# Patient Record
Sex: Female | Born: 1955 | Race: Black or African American | Hispanic: No | Marital: Single | Smoking: Current every day smoker
Health system: Southern US, Community
[De-identification: ages and names within clinical notes are randomized; demographics above are authoritative.]

## PROBLEM LIST (undated history)

## (undated) DIAGNOSIS — F419 Anxiety disorder, unspecified: Secondary | ICD-10-CM

## (undated) DIAGNOSIS — J9610 Chronic respiratory failure, unspecified whether with hypoxia or hypercapnia: Secondary | ICD-10-CM

## (undated) DIAGNOSIS — I509 Heart failure, unspecified: Secondary | ICD-10-CM

## (undated) DIAGNOSIS — I10 Essential (primary) hypertension: Secondary | ICD-10-CM

## (undated) DIAGNOSIS — K219 Gastro-esophageal reflux disease without esophagitis: Secondary | ICD-10-CM

## (undated) DIAGNOSIS — J45909 Unspecified asthma, uncomplicated: Secondary | ICD-10-CM

## (undated) DIAGNOSIS — F32A Depression, unspecified: Secondary | ICD-10-CM

## (undated) DIAGNOSIS — F329 Major depressive disorder, single episode, unspecified: Secondary | ICD-10-CM

## (undated) DIAGNOSIS — J449 Chronic obstructive pulmonary disease, unspecified: Secondary | ICD-10-CM

## (undated) HISTORY — PX: TUBAL LIGATION: SHX77

---

## 2013-01-25 ENCOUNTER — Emergency Department: Payer: Self-pay | Admitting: Emergency Medicine

## 2013-01-25 LAB — CK TOTAL AND CKMB (NOT AT ARMC): CK-MB: 6.8 ng/mL — ABNORMAL HIGH (ref 0.5–3.6)

## 2013-01-25 LAB — COMPREHENSIVE METABOLIC PANEL
Albumin: 4.3 g/dL (ref 3.4–5.0)
Alkaline Phosphatase: 72 U/L (ref 50–136)
Anion Gap: 2 — ABNORMAL LOW (ref 7–16)
Co2: 32 mmol/L (ref 21–32)
Creatinine: 0.92 mg/dL (ref 0.60–1.30)
EGFR (African American): >60
EGFR (Non-African Amer.): >60
SGPT (ALT): 44 U/L (ref 12–78)
Sodium: 141 mmol/L (ref 136–145)
Total Protein: 7.3 g/dL (ref 6.4–8.2)

## 2013-01-25 LAB — CBC
HCT: 36.9 % (ref 35.0–47.0)
HGB: 12.7 g/dL (ref 12.0–16.0)
MCH: 33.9 pg (ref 26.0–34.0)
MCHC: 34.5 g/dL (ref 32.0–36.0)
RBC: 3.76 10*6/uL — ABNORMAL LOW (ref 3.80–5.20)

## 2013-02-18 ENCOUNTER — Inpatient Hospital Stay: Payer: Self-pay | Admitting: Student

## 2013-02-18 LAB — CBC
HCT: 38.5 % (ref 35.0–47.0)
HGB: 12.8 g/dL (ref 12.0–16.0)
MCH: 32.9 pg (ref 26.0–34.0)
MCHC: 33.4 g/dL (ref 32.0–36.0)
MCV: 99 fL (ref 80–100)
Platelet: 311 10*3/uL (ref 150–440)
RBC: 3.9 10*6/uL (ref 3.80–5.20)
RDW: 12.6 % (ref 11.5–14.5)
WBC: 6.8 10*3/uL (ref 3.6–11.0)

## 2013-02-18 LAB — BASIC METABOLIC PANEL
Anion Gap: 2 — ABNORMAL LOW (ref 7–16)
Calcium, Total: 9.4 mg/dL (ref 8.5–10.1)
Chloride: 105 mmol/L (ref 98–107)
Co2: 33 mmol/L — ABNORMAL HIGH (ref 21–32)
Creatinine: 0.71 mg/dL (ref 0.60–1.30)
EGFR (African American): >60
Glucose: 105 mg/dL — ABNORMAL HIGH (ref 65–99)
Potassium: 4.3 mmol/L (ref 3.5–5.1)
Sodium: 140 mmol/L (ref 136–145)

## 2013-02-18 LAB — TROPONIN I: Troponin-I: <0.02 ng/mL

## 2013-02-19 LAB — CBC WITH DIFFERENTIAL/PLATELET
Basophil #: 0 10*3/uL (ref 0.0–0.1)
Basophil %: 0.2 %
Eosinophil #: 0 10*3/uL (ref 0.0–0.7)
Eosinophil %: 0 %
HCT: 37.8 % (ref 35.0–47.0)
HGB: 12.8 g/dL (ref 12.0–16.0)
Lymphocyte #: 0.7 10*3/uL — ABNORMAL LOW (ref 1.0–3.6)
Lymphocyte %: 11.8 %
MCH: 33.4 pg (ref 26.0–34.0)
MCHC: 33.9 g/dL (ref 32.0–36.0)
MCV: 99 fL (ref 80–100)
Monocyte #: 0.1 x10 3/mm — ABNORMAL LOW (ref 0.2–0.9)
Monocyte %: 2.5 %
Neutrophil #: 4.9 10*3/uL (ref 1.4–6.5)
Neutrophil %: 85.5 %
Platelet: 321 10*3/uL (ref 150–440)
RBC: 3.83 10*6/uL (ref 3.80–5.20)
RDW: 12.5 % (ref 11.5–14.5)
WBC: 5.8 10*3/uL (ref 3.6–11.0)

## 2013-02-19 LAB — BASIC METABOLIC PANEL
Anion Gap: 3 — ABNORMAL LOW (ref 7–16)
BUN: 15 mg/dL (ref 7–18)
Calcium, Total: 9.4 mg/dL (ref 8.5–10.1)
Chloride: 102 mmol/L (ref 98–107)
EGFR (African American): >60
EGFR (Non-African Amer.): >60
Glucose: 127 mg/dL — ABNORMAL HIGH (ref 65–99)
Potassium: 3.5 mmol/L (ref 3.5–5.1)
Sodium: 138 mmol/L (ref 136–145)

## 2013-02-19 LAB — LIPID PANEL: VLDL Cholesterol, Calc: 7 mg/dL (ref 5–40)

## 2013-02-19 LAB — HEMOGLOBIN A1C: Hemoglobin A1C: 5.7 % (ref 4.2–6.3)

## 2013-02-23 LAB — CULTURE, BLOOD (SINGLE)

## 2013-03-19 ENCOUNTER — Inpatient Hospital Stay: Payer: Self-pay | Admitting: Internal Medicine

## 2013-03-19 LAB — CBC
HGB: 13.2 g/dL (ref 12.0–16.0)
MCH: 32.7 pg (ref 26.0–34.0)
Platelet: 307 10*3/uL (ref 150–440)
RBC: 4.02 10*6/uL (ref 3.80–5.20)
RDW: 13.4 % (ref 11.5–14.5)

## 2013-03-19 LAB — BASIC METABOLIC PANEL
Anion Gap: 5 — ABNORMAL LOW (ref 7–16)
BUN: 9 mg/dL (ref 7–18)
Co2: 30 mmol/L (ref 21–32)
Creatinine: 0.68 mg/dL (ref 0.60–1.30)
EGFR (Non-African Amer.): >60
Sodium: 141 mmol/L (ref 136–145)

## 2013-03-20 LAB — CBC WITH DIFFERENTIAL/PLATELET
Basophil %: 0.3 %
Eosinophil %: 0 %
Lymphocyte #: 0.6 10*3/uL — ABNORMAL LOW (ref 1.0–3.6)
Lymphocyte %: 12.1 %
MCH: 33.2 pg (ref 26.0–34.0)
MCHC: 33.4 g/dL (ref 32.0–36.0)
MCV: 99 fL (ref 80–100)
Monocyte #: 0.1 x10 3/mm — ABNORMAL LOW (ref 0.2–0.9)
Monocyte %: 1.4 %
Neutrophil #: 4.4 10*3/uL (ref 1.4–6.5)
Neutrophil %: 86.2 %
Platelet: 294 10*3/uL (ref 150–440)
RBC: 3.73 10*6/uL — ABNORMAL LOW (ref 3.80–5.20)
RDW: 12.9 % (ref 11.5–14.5)

## 2013-03-20 LAB — COMPREHENSIVE METABOLIC PANEL
Albumin: 4.1 g/dL (ref 3.4–5.0)
Anion Gap: 4 — ABNORMAL LOW (ref 7–16)
BUN: 11 mg/dL (ref 7–18)
Bilirubin,Total: 0.3 mg/dL (ref 0.2–1.0)
Calcium, Total: 9.6 mg/dL (ref 8.5–10.1)
Chloride: 101 mmol/L (ref 98–107)
Co2: 32 mmol/L (ref 21–32)
Creatinine: 0.83 mg/dL (ref 0.60–1.30)
EGFR (African American): >60
Glucose: 188 mg/dL — ABNORMAL HIGH (ref 65–99)
Potassium: 3.9 mmol/L (ref 3.5–5.1)
SGOT(AST): 21 U/L (ref 15–37)
Sodium: 137 mmol/L (ref 136–145)
Total Protein: 7.3 g/dL (ref 6.4–8.2)

## 2013-03-20 LAB — MAGNESIUM: Magnesium: 2 mg/dL

## 2013-03-20 LAB — RAPID INFLUENZA A&B ANTIGENS

## 2013-03-24 LAB — CULTURE, BLOOD (SINGLE)

## 2013-05-11 ENCOUNTER — Inpatient Hospital Stay (HOSPITAL_COMMUNITY)
Admission: AD | Admit: 2013-05-11 | Payer: Self-pay | Source: Other Acute Inpatient Hospital | Admitting: Pulmonary Disease

## 2013-05-11 ENCOUNTER — Inpatient Hospital Stay
Admission: AD | Admit: 2013-05-11 | Payer: Self-pay | Source: Other Acute Inpatient Hospital | Admitting: Pulmonary Disease

## 2013-05-11 ENCOUNTER — Emergency Department: Payer: Self-pay | Admitting: Internal Medicine

## 2013-05-11 LAB — CBC
HCT: 42.6 % (ref 35.0–47.0)
HGB: 14.2 g/dL (ref 12.0–16.0)
MCH: 33.5 pg (ref 26.0–34.0)
MCHC: 33.4 g/dL (ref 32.0–36.0)
MCV: 100 fL (ref 80–100)
PLATELETS: 296 10*3/uL (ref 150–440)
RBC: 4.24 10*6/uL (ref 3.80–5.20)
RDW: 13.6 % (ref 11.5–14.5)
WBC: 5.9 10*3/uL (ref 3.6–11.0)

## 2013-05-11 LAB — PRO B NATRIURETIC PEPTIDE: B-Type Natriuretic Peptide: 56 pg/mL (ref 0–125)

## 2013-05-11 LAB — COMPREHENSIVE METABOLIC PANEL
ALK PHOS: 68 U/L
ANION GAP: 2 — AB (ref 7–16)
Albumin: 4.4 g/dL (ref 3.4–5.0)
BUN: 10 mg/dL (ref 7–18)
Bilirubin,Total: 0.3 mg/dL (ref 0.2–1.0)
CALCIUM: 10.1 mg/dL (ref 8.5–10.1)
CHLORIDE: 103 mmol/L (ref 98–107)
CO2: 34 mmol/L — AB (ref 21–32)
Creatinine: 0.67 mg/dL (ref 0.60–1.30)
EGFR (Non-African Amer.): >60
Glucose: 121 mg/dL — ABNORMAL HIGH (ref 65–99)
Osmolality: 278 (ref 275–301)
Potassium: 4.4 mmol/L (ref 3.5–5.1)
SGOT(AST): 23 U/L (ref 15–37)
SGPT (ALT): 30 U/L (ref 12–78)
SODIUM: 139 mmol/L (ref 136–145)
Total Protein: 8.2 g/dL (ref 6.4–8.2)

## 2013-05-11 LAB — TROPONIN I: Troponin-I: <0.02 ng/mL

## 2013-05-12 ENCOUNTER — Inpatient Hospital Stay: Payer: Self-pay | Admitting: Internal Medicine

## 2013-05-12 LAB — COMPREHENSIVE METABOLIC PANEL
ALT: 37 U/L (ref 12–78)
ANION GAP: 5 — AB (ref 7–16)
Albumin: 4.5 g/dL (ref 3.4–5.0)
Alkaline Phosphatase: 65 U/L
BUN: 12 mg/dL (ref 7–18)
Bilirubin,Total: 0.4 mg/dL (ref 0.2–1.0)
CALCIUM: 10.1 mg/dL (ref 8.5–10.1)
CHLORIDE: 100 mmol/L (ref 98–107)
CO2: 33 mmol/L — AB (ref 21–32)
Creatinine: 0.8 mg/dL (ref 0.60–1.30)
EGFR (Non-African Amer.): >60
Glucose: 146 mg/dL — ABNORMAL HIGH (ref 65–99)
Osmolality: 278 (ref 275–301)
Potassium: 4.2 mmol/L (ref 3.5–5.1)
SGOT(AST): 23 U/L (ref 15–37)
SODIUM: 138 mmol/L (ref 136–145)
Total Protein: 8.2 g/dL (ref 6.4–8.2)

## 2013-05-12 LAB — CBC
HCT: 42.6 % (ref 35.0–47.0)
HGB: 14.2 g/dL (ref 12.0–16.0)
MCH: 33.3 pg (ref 26.0–34.0)
MCHC: 33.3 g/dL (ref 32.0–36.0)
MCV: 100 fL (ref 80–100)
Platelet: 281 10*3/uL (ref 150–440)
RBC: 4.26 10*6/uL (ref 3.80–5.20)
RDW: 13.2 % (ref 11.5–14.5)
WBC: 5.6 10*3/uL (ref 3.6–11.0)

## 2013-05-12 LAB — CK TOTAL AND CKMB (NOT AT ARMC)
CK, TOTAL: 322 U/L — AB
CK-MB: 11.3 ng/mL — AB (ref 0.5–3.6)

## 2013-05-12 LAB — PRO B NATRIURETIC PEPTIDE: B-Type Natriuretic Peptide: 90 pg/mL (ref 0–125)

## 2013-05-12 LAB — TROPONIN I

## 2013-05-13 LAB — BASIC METABOLIC PANEL
ANION GAP: 0 — AB (ref 7–16)
BUN: 14 mg/dL (ref 7–18)
Calcium, Total: 9.5 mg/dL (ref 8.5–10.1)
Chloride: 104 mmol/L (ref 98–107)
Co2: 33 mmol/L — ABNORMAL HIGH (ref 21–32)
Creatinine: 1.03 mg/dL (ref 0.60–1.30)
EGFR (African American): >60
EGFR (Non-African Amer.): >60
Glucose: 137 mg/dL — ABNORMAL HIGH (ref 65–99)
Osmolality: 276 (ref 275–301)
Potassium: 4.3 mmol/L (ref 3.5–5.1)
Sodium: 137 mmol/L (ref 136–145)

## 2013-05-13 LAB — CBC WITH DIFFERENTIAL/PLATELET
BASOS ABS: 0 10*3/uL (ref 0.0–0.1)
Basophil %: 0.2 %
EOS ABS: 0 10*3/uL (ref 0.0–0.7)
EOS PCT: 0 %
HCT: 38.8 % (ref 35.0–47.0)
HGB: 13 g/dL (ref 12.0–16.0)
LYMPHS ABS: 0.6 10*3/uL — AB (ref 1.0–3.6)
LYMPHS PCT: 8.4 %
MCH: 33.7 pg (ref 26.0–34.0)
MCHC: 33.6 g/dL (ref 32.0–36.0)
MCV: 100 fL (ref 80–100)
MONOS PCT: 2.5 %
Monocyte #: 0.2 x10 3/mm (ref 0.2–0.9)
NEUTROS PCT: 88.9 %
Neutrophil #: 5.8 10*3/uL (ref 1.4–6.5)
PLATELETS: 256 10*3/uL (ref 150–440)
RBC: 3.86 10*6/uL (ref 3.80–5.20)
RDW: 13.1 % (ref 11.5–14.5)
WBC: 6.5 10*3/uL (ref 3.6–11.0)

## 2013-06-03 ENCOUNTER — Inpatient Hospital Stay: Payer: Self-pay | Admitting: Internal Medicine

## 2013-06-03 LAB — COMPREHENSIVE METABOLIC PANEL
Albumin: 4.1 g/dL (ref 3.4–5.0)
Alkaline Phosphatase: 69 U/L
Anion Gap: 7 (ref 7–16)
BUN: 14 mg/dL (ref 7–18)
Bilirubin,Total: 0.3 mg/dL (ref 0.2–1.0)
CALCIUM: 9.2 mg/dL (ref 8.5–10.1)
CO2: 28 mmol/L (ref 21–32)
Chloride: 106 mmol/L (ref 98–107)
Creatinine: 0.91 mg/dL (ref 0.60–1.30)
EGFR (Non-African Amer.): >60
GLUCOSE: 141 mg/dL — AB (ref 65–99)
OSMOLALITY: 284 (ref 275–301)
Potassium: 3.4 mmol/L — ABNORMAL LOW (ref 3.5–5.1)
SGOT(AST): 11 U/L — ABNORMAL LOW (ref 15–37)
SGPT (ALT): 23 U/L (ref 12–78)
Sodium: 141 mmol/L (ref 136–145)
Total Protein: 7.8 g/dL (ref 6.4–8.2)

## 2013-06-03 LAB — CBC
HCT: 40.3 % (ref 35.0–47.0)
HGB: 13.7 g/dL (ref 12.0–16.0)
MCH: 33.8 pg (ref 26.0–34.0)
MCHC: 34 g/dL (ref 32.0–36.0)
MCV: 99 fL (ref 80–100)
Platelet: 324 10*3/uL (ref 150–440)
RBC: 4.07 10*6/uL (ref 3.80–5.20)
RDW: 13.4 % (ref 11.5–14.5)
WBC: 10.8 10*3/uL (ref 3.6–11.0)

## 2013-06-03 LAB — TROPONIN I

## 2013-06-03 LAB — CK TOTAL AND CKMB (NOT AT ARMC)
CK, TOTAL: 133 U/L
CK-MB: 4.7 ng/mL — AB (ref 0.5–3.6)

## 2013-06-03 LAB — MAGNESIUM: MAGNESIUM: 1.9 mg/dL

## 2013-06-04 LAB — BASIC METABOLIC PANEL
Anion Gap: 4 — ABNORMAL LOW (ref 7–16)
BUN: 14 mg/dL (ref 7–18)
CALCIUM: 8.8 mg/dL (ref 8.5–10.1)
Chloride: 104 mmol/L (ref 98–107)
Co2: 30 mmol/L (ref 21–32)
Creatinine: 0.86 mg/dL (ref 0.60–1.30)
Glucose: 155 mg/dL — ABNORMAL HIGH (ref 65–99)
Osmolality: 279 (ref 275–301)
Potassium: 3.9 mmol/L (ref 3.5–5.1)
Sodium: 138 mmol/L (ref 136–145)

## 2013-06-04 LAB — CBC WITH DIFFERENTIAL/PLATELET
BASOS PCT: 0.1 %
Basophil #: 0 10*3/uL (ref 0.0–0.1)
EOS ABS: 0 10*3/uL (ref 0.0–0.7)
EOS PCT: 0 %
HCT: 36.7 % (ref 35.0–47.0)
HGB: 11.8 g/dL — ABNORMAL LOW (ref 12.0–16.0)
Lymphocyte #: 0.5 10*3/uL — ABNORMAL LOW (ref 1.0–3.6)
Lymphocyte %: 6.5 %
MCH: 31.8 pg (ref 26.0–34.0)
MCHC: 32.1 g/dL (ref 32.0–36.0)
MCV: 99 fL (ref 80–100)
Monocyte #: 0.4 x10 3/mm (ref 0.2–0.9)
Monocyte %: 4.3 %
NEUTROS ABS: 7.3 10*3/uL — AB (ref 1.4–6.5)
Neutrophil %: 89.1 %
Platelet: 315 10*3/uL (ref 150–440)
RBC: 3.71 10*6/uL — AB (ref 3.80–5.20)
RDW: 13.3 % (ref 11.5–14.5)
WBC: 8.2 10*3/uL (ref 3.6–11.0)

## 2013-06-28 ENCOUNTER — Inpatient Hospital Stay: Payer: Self-pay | Admitting: Family Medicine

## 2013-06-28 LAB — CBC
HCT: 41.1 % (ref 35.0–47.0)
HGB: 13.4 g/dL (ref 12.0–16.0)
MCH: 32.4 pg (ref 26.0–34.0)
MCHC: 32.5 g/dL (ref 32.0–36.0)
MCV: 100 fL (ref 80–100)
Platelet: 317 10*3/uL (ref 150–440)
RBC: 4.12 10*6/uL (ref 3.80–5.20)
RDW: 13.2 % (ref 11.5–14.5)
WBC: 5.7 10*3/uL (ref 3.6–11.0)

## 2013-06-28 LAB — CK TOTAL AND CKMB (NOT AT ARMC)
CK, TOTAL: 224 U/L — AB
CK-MB: 7.7 ng/mL — ABNORMAL HIGH (ref 0.5–3.6)

## 2013-06-28 LAB — COMPREHENSIVE METABOLIC PANEL
Albumin: 4.3 g/dL (ref 3.4–5.0)
Alkaline Phosphatase: 69 U/L
Anion Gap: 5 — ABNORMAL LOW (ref 7–16)
BUN: 7 mg/dL (ref 7–18)
Bilirubin,Total: 0.4 mg/dL (ref 0.2–1.0)
CHLORIDE: 102 mmol/L (ref 98–107)
CREATININE: 0.8 mg/dL (ref 0.60–1.30)
Calcium, Total: 9.6 mg/dL (ref 8.5–10.1)
Co2: 32 mmol/L (ref 21–32)
EGFR (African American): >60
EGFR (Non-African Amer.): >60
Glucose: 149 mg/dL — ABNORMAL HIGH (ref 65–99)
Osmolality: 278 (ref 275–301)
Potassium: 4 mmol/L (ref 3.5–5.1)
SGOT(AST): 16 U/L (ref 15–37)
SGPT (ALT): 24 U/L (ref 12–78)
SODIUM: 139 mmol/L (ref 136–145)
TOTAL PROTEIN: 7.9 g/dL (ref 6.4–8.2)

## 2013-06-28 LAB — TROPONIN I

## 2013-08-31 ENCOUNTER — Inpatient Hospital Stay: Payer: Self-pay | Admitting: Specialist

## 2013-08-31 LAB — CBC
HCT: 41.9 % (ref 35.0–47.0)
HGB: 13.7 g/dL (ref 12.0–16.0)
MCH: 33.3 pg (ref 26.0–34.0)
MCHC: 32.8 g/dL (ref 32.0–36.0)
MCV: 102 fL — ABNORMAL HIGH (ref 80–100)
PLATELETS: 343 10*3/uL (ref 150–440)
RBC: 4.13 10*6/uL (ref 3.80–5.20)
RDW: 13.2 % (ref 11.5–14.5)
WBC: 9.1 10*3/uL (ref 3.6–11.0)

## 2013-08-31 LAB — COMPREHENSIVE METABOLIC PANEL
ALK PHOS: 66 U/L
ALT: 31 U/L (ref 12–78)
Albumin: 4.2 g/dL (ref 3.4–5.0)
Anion Gap: 4 — ABNORMAL LOW (ref 7–16)
BUN: 12 mg/dL (ref 7–18)
Bilirubin,Total: 0.2 mg/dL (ref 0.2–1.0)
CHLORIDE: 100 mmol/L (ref 98–107)
Calcium, Total: 9.2 mg/dL (ref 8.5–10.1)
Co2: 31 mmol/L (ref 21–32)
Creatinine: 0.51 mg/dL — ABNORMAL LOW (ref 0.60–1.30)
EGFR (Non-African Amer.): >60
Glucose: 120 mg/dL — ABNORMAL HIGH (ref 65–99)
Osmolality: 271 (ref 275–301)
Potassium: 4.9 mmol/L (ref 3.5–5.1)
SGOT(AST): 28 U/L (ref 15–37)
Sodium: 135 mmol/L — ABNORMAL LOW (ref 136–145)
Total Protein: 8.1 g/dL (ref 6.4–8.2)

## 2013-08-31 LAB — PRO B NATRIURETIC PEPTIDE: B-Type Natriuretic Peptide: 85 pg/mL (ref 0–125)

## 2013-08-31 LAB — TROPONIN I: Troponin-I: <0.02 ng/mL

## 2013-09-01 LAB — BASIC METABOLIC PANEL
ANION GAP: 4 — AB (ref 7–16)
BUN: 18 mg/dL (ref 7–18)
CALCIUM: 9.5 mg/dL (ref 8.5–10.1)
CREATININE: 0.75 mg/dL (ref 0.60–1.30)
Chloride: 98 mmol/L (ref 98–107)
Co2: 33 mmol/L — ABNORMAL HIGH (ref 21–32)
EGFR (African American): >60
EGFR (Non-African Amer.): >60
Glucose: 131 mg/dL — ABNORMAL HIGH (ref 65–99)
OSMOLALITY: 274 (ref 275–301)
Potassium: 4.4 mmol/L (ref 3.5–5.1)
Sodium: 135 mmol/L — ABNORMAL LOW (ref 136–145)

## 2013-09-01 LAB — CBC WITH DIFFERENTIAL/PLATELET
BASOS PCT: 0.1 %
Basophil #: 0 10*3/uL (ref 0.0–0.1)
EOS ABS: 0 10*3/uL (ref 0.0–0.7)
EOS PCT: 0 %
HCT: 39.2 % (ref 35.0–47.0)
HGB: 12.8 g/dL (ref 12.0–16.0)
Lymphocyte #: 0.3 10*3/uL — ABNORMAL LOW (ref 1.0–3.6)
Lymphocyte %: 5.1 %
MCH: 33 pg (ref 26.0–34.0)
MCHC: 32.7 g/dL (ref 32.0–36.0)
MCV: 101 fL — ABNORMAL HIGH (ref 80–100)
MONOS PCT: 3.8 %
Monocyte #: 0.3 x10 3/mm (ref 0.2–0.9)
NEUTROS ABS: 6.2 10*3/uL (ref 1.4–6.5)
NEUTROS PCT: 91 %
Platelet: 302 10*3/uL (ref 150–440)
RBC: 3.89 10*6/uL (ref 3.80–5.20)
RDW: 13 % (ref 11.5–14.5)
WBC: 6.8 10*3/uL (ref 3.6–11.0)

## 2013-09-04 LAB — CREATININE, SERUM
Creatinine: 0.75 mg/dL (ref 0.60–1.30)
EGFR (African American): >60
EGFR (Non-African Amer.): >60

## 2013-09-05 LAB — CULTURE, BLOOD (SINGLE)

## 2013-11-10 LAB — CBC
HCT: 42.8 % (ref 35.0–47.0)
HGB: 13.7 g/dL (ref 12.0–16.0)
MCH: 32.5 pg (ref 26.0–34.0)
MCHC: 32 g/dL (ref 32.0–36.0)
MCV: 102 fL — ABNORMAL HIGH (ref 80–100)
Platelet: 322 10*3/uL (ref 150–440)
RBC: 4.22 10*6/uL (ref 3.80–5.20)
RDW: 13.6 % (ref 11.5–14.5)
WBC: 7.3 10*3/uL (ref 3.6–11.0)

## 2013-11-10 LAB — BASIC METABOLIC PANEL
ANION GAP: 4 — AB (ref 7–16)
BUN: 12 mg/dL (ref 7–18)
CALCIUM: 9.6 mg/dL (ref 8.5–10.1)
CHLORIDE: 110 mmol/L — AB (ref 98–107)
CO2: 30 mmol/L (ref 21–32)
Creatinine: 1.03 mg/dL (ref 0.60–1.30)
EGFR (African American): >60
EGFR (Non-African Amer.): >60
Glucose: 114 mg/dL — ABNORMAL HIGH (ref 65–99)
Osmolality: 287 (ref 275–301)
Potassium: 4.2 mmol/L (ref 3.5–5.1)
SODIUM: 144 mmol/L (ref 136–145)

## 2013-11-10 LAB — TROPONIN I

## 2013-11-11 ENCOUNTER — Inpatient Hospital Stay: Payer: Self-pay | Admitting: Internal Medicine

## 2013-11-26 ENCOUNTER — Inpatient Hospital Stay: Payer: Self-pay | Admitting: Internal Medicine

## 2013-11-26 LAB — COMPREHENSIVE METABOLIC PANEL
ALK PHOS: 71 U/L
ANION GAP: 9 (ref 7–16)
AST: 32 U/L (ref 15–37)
Albumin: 4.4 g/dL (ref 3.4–5.0)
BILIRUBIN TOTAL: 0.4 mg/dL (ref 0.2–1.0)
BUN: 11 mg/dL (ref 7–18)
CHLORIDE: 109 mmol/L — AB (ref 98–107)
CO2: 26 mmol/L (ref 21–32)
Calcium, Total: 8.9 mg/dL (ref 8.5–10.1)
Creatinine: 0.9 mg/dL (ref 0.60–1.30)
GLUCOSE: 127 mg/dL — AB (ref 65–99)
OSMOLALITY: 288 (ref 275–301)
Potassium: 4.3 mmol/L (ref 3.5–5.1)
SGPT (ALT): 30 U/L
Sodium: 144 mmol/L (ref 136–145)
Total Protein: 8.1 g/dL (ref 6.4–8.2)

## 2013-11-26 LAB — CBC
HCT: 44 % (ref 35.0–47.0)
HGB: 14.1 g/dL (ref 12.0–16.0)
MCH: 32.6 pg (ref 26.0–34.0)
MCHC: 32 g/dL (ref 32.0–36.0)
MCV: 102 fL — ABNORMAL HIGH (ref 80–100)
Platelet: 336 10*3/uL (ref 150–440)
RBC: 4.32 10*6/uL (ref 3.80–5.20)
RDW: 13.6 % (ref 11.5–14.5)
WBC: 9 10*3/uL (ref 3.6–11.0)

## 2013-11-26 LAB — TROPONIN I: Troponin-I: <0.02 ng/mL

## 2013-11-26 LAB — PRO B NATRIURETIC PEPTIDE: B-Type Natriuretic Peptide: 86 pg/mL (ref 0–125)

## 2013-11-29 ENCOUNTER — Ambulatory Visit: Payer: Self-pay | Admitting: Internal Medicine

## 2013-11-29 ENCOUNTER — Inpatient Hospital Stay: Payer: Self-pay | Admitting: Internal Medicine

## 2013-11-29 LAB — DRUG SCREEN, URINE
AMPHETAMINES, UR SCREEN: NEGATIVE (ref ?–1000)
BARBITURATES, UR SCREEN: NEGATIVE (ref ?–200)
Benzodiazepine, Ur Scrn: POSITIVE (ref ?–200)
Cannabinoid 50 Ng, Ur ~~LOC~~: NEGATIVE (ref ?–50)
Cocaine Metabolite,Ur ~~LOC~~: POSITIVE (ref ?–300)
MDMA (ECSTASY) UR SCREEN: NEGATIVE (ref ?–500)
Methadone, Ur Screen: NEGATIVE (ref ?–300)
OPIATE, UR SCREEN: POSITIVE (ref ?–300)
PHENCYCLIDINE (PCP) UR S: NEGATIVE (ref ?–25)
TRICYCLIC, UR SCREEN: NEGATIVE (ref ?–1000)

## 2013-11-29 LAB — BASIC METABOLIC PANEL
Anion Gap: 5 — ABNORMAL LOW (ref 7–16)
BUN: 15 mg/dL (ref 7–18)
Calcium, Total: 9.1 mg/dL (ref 8.5–10.1)
Chloride: 99 mmol/L (ref 98–107)
Co2: 32 mmol/L (ref 21–32)
Creatinine: 0.84 mg/dL (ref 0.60–1.30)
EGFR (African American): >60
EGFR (Non-African Amer.): >60
GLUCOSE: 110 mg/dL — AB (ref 65–99)
Osmolality: 273 (ref 275–301)
Potassium: 4.6 mmol/L (ref 3.5–5.1)
Sodium: 136 mmol/L (ref 136–145)

## 2013-11-29 LAB — CBC
HCT: 39.6 % (ref 35.0–47.0)
HGB: 12.7 g/dL (ref 12.0–16.0)
MCH: 32.6 pg (ref 26.0–34.0)
MCHC: 32.1 g/dL (ref 32.0–36.0)
MCV: 101 fL — ABNORMAL HIGH (ref 80–100)
Platelet: 322 10*3/uL (ref 150–440)
RBC: 3.91 10*6/uL (ref 3.80–5.20)
RDW: 13.5 % (ref 11.5–14.5)
WBC: 15 10*3/uL — ABNORMAL HIGH (ref 3.6–11.0)

## 2013-11-29 LAB — TROPONIN I

## 2013-11-30 LAB — BASIC METABOLIC PANEL
Anion Gap: 4 — ABNORMAL LOW (ref 7–16)
BUN: 16 mg/dL (ref 7–18)
CALCIUM: 8.7 mg/dL (ref 8.5–10.1)
Chloride: 100 mmol/L (ref 98–107)
Co2: 35 mmol/L — ABNORMAL HIGH (ref 21–32)
Creatinine: 0.88 mg/dL (ref 0.60–1.30)
EGFR (Non-African Amer.): >60
Glucose: 123 mg/dL — ABNORMAL HIGH (ref 65–99)
Osmolality: 280 (ref 275–301)
POTASSIUM: 4.6 mmol/L (ref 3.5–5.1)
Sodium: 139 mmol/L (ref 136–145)

## 2013-11-30 LAB — CBC WITH DIFFERENTIAL/PLATELET
Basophil #: 0 10*3/uL (ref 0.0–0.1)
Basophil %: 0.1 %
EOS ABS: 0 10*3/uL (ref 0.0–0.7)
Eosinophil %: 0 %
HCT: 35 % (ref 35.0–47.0)
HGB: 11.5 g/dL — AB (ref 12.0–16.0)
LYMPHS PCT: 8.5 %
Lymphocyte #: 0.5 10*3/uL — ABNORMAL LOW (ref 1.0–3.6)
MCH: 33.3 pg (ref 26.0–34.0)
MCHC: 32.9 g/dL (ref 32.0–36.0)
MCV: 101 fL — AB (ref 80–100)
MONOS PCT: 7.1 %
Monocyte #: 0.5 x10 3/mm (ref 0.2–0.9)
Neutrophil #: 5.3 10*3/uL (ref 1.4–6.5)
Neutrophil %: 84.3 %
Platelet: 249 10*3/uL (ref 150–440)
RBC: 3.46 10*6/uL — AB (ref 3.80–5.20)
RDW: 13.6 % (ref 11.5–14.5)
WBC: 6.3 10*3/uL (ref 3.6–11.0)

## 2013-12-01 LAB — BASIC METABOLIC PANEL
ANION GAP: 3 — AB (ref 7–16)
BUN: 19 mg/dL — AB (ref 7–18)
CHLORIDE: 101 mmol/L (ref 98–107)
CO2: 37 mmol/L — AB (ref 21–32)
Calcium, Total: 9 mg/dL (ref 8.5–10.1)
Creatinine: 0.89 mg/dL (ref 0.60–1.30)
EGFR (African American): >60
EGFR (Non-African Amer.): >60
Glucose: 85 mg/dL (ref 65–99)
OSMOLALITY: 283 (ref 275–301)
Potassium: 3.8 mmol/L (ref 3.5–5.1)
Sodium: 141 mmol/L (ref 136–145)

## 2013-12-01 LAB — CBC WITH DIFFERENTIAL/PLATELET
BASOS ABS: 0 10*3/uL (ref 0.0–0.1)
Basophil %: 0.1 %
EOS PCT: 0.8 %
Eosinophil #: 0.1 10*3/uL (ref 0.0–0.7)
HCT: 38 % (ref 35.0–47.0)
HGB: 12.1 g/dL (ref 12.0–16.0)
Lymphocyte #: 2.1 10*3/uL (ref 1.0–3.6)
Lymphocyte %: 26.6 %
MCH: 32.5 pg (ref 26.0–34.0)
MCHC: 31.8 g/dL — ABNORMAL LOW (ref 32.0–36.0)
MCV: 102 fL — ABNORMAL HIGH (ref 80–100)
MONOS PCT: 7.9 %
Monocyte #: 0.6 x10 3/mm (ref 0.2–0.9)
NEUTROS PCT: 64.6 %
Neutrophil #: 5.1 10*3/uL (ref 1.4–6.5)
PLATELETS: 269 10*3/uL (ref 150–440)
RBC: 3.73 10*6/uL — AB (ref 3.80–5.20)
RDW: 13.5 % (ref 11.5–14.5)
WBC: 7.8 10*3/uL (ref 3.6–11.0)

## 2013-12-23 ENCOUNTER — Inpatient Hospital Stay: Payer: Self-pay | Admitting: Internal Medicine

## 2013-12-23 LAB — CBC
HCT: 38.2 % (ref 35.0–47.0)
HGB: 12 g/dL (ref 12.0–16.0)
MCH: 32.1 pg (ref 26.0–34.0)
MCHC: 31.4 g/dL — ABNORMAL LOW (ref 32.0–36.0)
MCV: 102 fL — AB (ref 80–100)
Platelet: 288 10*3/uL (ref 150–440)
RBC: 3.74 10*6/uL — ABNORMAL LOW (ref 3.80–5.20)
RDW: 13.3 % (ref 11.5–14.5)
WBC: 5.5 10*3/uL (ref 3.6–11.0)

## 2013-12-23 LAB — BASIC METABOLIC PANEL
Anion Gap: 4 — ABNORMAL LOW (ref 7–16)
BUN: 10 mg/dL (ref 7–18)
CALCIUM: 9 mg/dL (ref 8.5–10.1)
CHLORIDE: 106 mmol/L (ref 98–107)
CO2: 33 mmol/L — AB (ref 21–32)
Creatinine: 0.76 mg/dL (ref 0.60–1.30)
EGFR (African American): >60
EGFR (Non-African Amer.): >60
Glucose: 104 mg/dL — ABNORMAL HIGH (ref 65–99)
Osmolality: 284 (ref 275–301)
Potassium: 4 mmol/L (ref 3.5–5.1)
Sodium: 143 mmol/L (ref 136–145)

## 2013-12-23 LAB — URINALYSIS, COMPLETE
Bilirubin,UR: NEGATIVE
Ketone: NEGATIVE
Leukocyte Esterase: NEGATIVE
Nitrite: NEGATIVE
Ph: 6 (ref 4.5–8.0)
RBC,UR: 5630 /HPF (ref 0–5)
Specific Gravity: 1.025 (ref 1.003–1.030)
Squamous Epithelial: 3
WBC UR: 21 /HPF (ref 0–5)

## 2013-12-23 LAB — DRUG SCREEN, URINE
Amphetamines, Ur Screen: NEGATIVE (ref ?–1000)
BARBITURATES, UR SCREEN: NEGATIVE (ref ?–200)
Benzodiazepine, Ur Scrn: POSITIVE (ref ?–200)
CANNABINOID 50 NG, UR ~~LOC~~: NEGATIVE (ref ?–50)
Cocaine Metabolite,Ur ~~LOC~~: POSITIVE (ref ?–300)
MDMA (ECSTASY) UR SCREEN: NEGATIVE (ref ?–500)
Methadone, Ur Screen: NEGATIVE (ref ?–300)
Opiate, Ur Screen: NEGATIVE (ref ?–300)
Phencyclidine (PCP) Ur S: NEGATIVE (ref ?–25)
Tricyclic, Ur Screen: NEGATIVE (ref ?–1000)

## 2013-12-23 LAB — TROPONIN I

## 2013-12-29 ENCOUNTER — Ambulatory Visit: Payer: Self-pay | Admitting: Internal Medicine

## 2014-04-15 LAB — COMPREHENSIVE METABOLIC PANEL
ALBUMIN: 4.3 g/dL (ref 3.4–5.0)
ANION GAP: 7 (ref 7–16)
AST: 34 U/L (ref 15–37)
Alkaline Phosphatase: 61 U/L
BILIRUBIN TOTAL: 0.2 mg/dL (ref 0.2–1.0)
BUN: 11 mg/dL (ref 7–18)
CALCIUM: 9.2 mg/dL (ref 8.5–10.1)
Chloride: 104 mmol/L (ref 98–107)
Co2: 30 mmol/L (ref 21–32)
Creatinine: 0.83 mg/dL (ref 0.60–1.30)
EGFR (African American): >60
EGFR (Non-African Amer.): >60
Glucose: 125 mg/dL — ABNORMAL HIGH (ref 65–99)
OSMOLALITY: 282 (ref 275–301)
POTASSIUM: 4.6 mmol/L (ref 3.5–5.1)
SGPT (ALT): 39 U/L
Sodium: 141 mmol/L (ref 136–145)
Total Protein: 7.9 g/dL (ref 6.4–8.2)

## 2014-04-15 LAB — CBC
HCT: 42.5 % (ref 35.0–47.0)
HGB: 13.9 g/dL (ref 12.0–16.0)
MCH: 33.3 pg (ref 26.0–34.0)
MCHC: 32.7 g/dL (ref 32.0–36.0)
MCV: 102 fL — AB (ref 80–100)
Platelet: 378 10*3/uL (ref 150–440)
RBC: 4.18 10*6/uL (ref 3.80–5.20)
RDW: 13.2 % (ref 11.5–14.5)
WBC: 9.2 10*3/uL (ref 3.6–11.0)

## 2014-04-15 LAB — PROTIME-INR
INR: 0.9
Prothrombin Time: 12.3 secs (ref 11.5–14.7)

## 2014-04-15 LAB — MAGNESIUM: Magnesium: 2.1 mg/dL

## 2014-04-15 LAB — TROPONIN I

## 2014-04-15 LAB — LIPASE, BLOOD: LIPASE: 57 U/L — AB (ref 73–393)

## 2014-04-16 ENCOUNTER — Inpatient Hospital Stay: Payer: Self-pay | Admitting: Internal Medicine

## 2014-05-06 ENCOUNTER — Inpatient Hospital Stay: Payer: Self-pay | Admitting: Internal Medicine

## 2014-05-06 LAB — COMPREHENSIVE METABOLIC PANEL
ALBUMIN: 3.9 g/dL (ref 3.4–5.0)
ALK PHOS: 63 U/L (ref 46–116)
ANION GAP: 3 — AB (ref 7–16)
BUN: 10 mg/dL (ref 7–18)
Bilirubin,Total: 0.5 mg/dL (ref 0.2–1.0)
Calcium, Total: 8.9 mg/dL (ref 8.5–10.1)
Chloride: 105 mmol/L (ref 98–107)
Co2: 34 mmol/L — ABNORMAL HIGH (ref 21–32)
Creatinine: 1.03 mg/dL (ref 0.60–1.30)
EGFR (African American): >60
EGFR (Non-African Amer.): 59 — ABNORMAL LOW
GLUCOSE: 116 mg/dL — AB (ref 65–99)
OSMOLALITY: 283 (ref 275–301)
Potassium: 4.4 mmol/L (ref 3.5–5.1)
SGOT(AST): 28 U/L (ref 15–37)
SGPT (ALT): 29 U/L (ref 14–63)
SODIUM: 142 mmol/L (ref 136–145)
TOTAL PROTEIN: 7.5 g/dL (ref 6.4–8.2)

## 2014-05-06 LAB — DRUG SCREEN, URINE
Amphetamines, Ur Screen: NEGATIVE (ref ?–1000)
BENZODIAZEPINE, UR SCRN: NEGATIVE (ref ?–200)
Barbiturates, Ur Screen: NEGATIVE (ref ?–200)
COCAINE METABOLITE, UR ~~LOC~~: POSITIVE (ref ?–300)
Cannabinoid 50 Ng, Ur ~~LOC~~: NEGATIVE (ref ?–50)
MDMA (Ecstasy)Ur Screen: NEGATIVE (ref ?–500)
METHADONE, UR SCREEN: NEGATIVE (ref ?–300)
Opiate, Ur Screen: NEGATIVE (ref ?–300)
Phencyclidine (PCP) Ur S: NEGATIVE (ref ?–25)
TRICYCLIC, UR SCREEN: NEGATIVE (ref ?–1000)

## 2014-05-06 LAB — CBC
HCT: 43.1 % (ref 35.0–47.0)
HGB: 14.1 g/dL (ref 12.0–16.0)
MCH: 32.7 pg (ref 26.0–34.0)
MCHC: 32.7 g/dL (ref 32.0–36.0)
MCV: 100 fL (ref 80–100)
Platelet: 361 10*3/uL (ref 150–440)
RBC: 4.3 10*6/uL (ref 3.80–5.20)
RDW: 13.2 % (ref 11.5–14.5)
WBC: 8.4 10*3/uL (ref 3.6–11.0)

## 2014-05-06 LAB — TROPONIN I: Troponin-I: <0.02 ng/mL

## 2014-05-07 LAB — BASIC METABOLIC PANEL
ANION GAP: 5 — AB (ref 7–16)
BUN: 12 mg/dL (ref 7–18)
Calcium, Total: 9.2 mg/dL (ref 8.5–10.1)
Chloride: 102 mmol/L (ref 98–107)
Co2: 32 mmol/L (ref 21–32)
Creatinine: 0.95 mg/dL (ref 0.60–1.30)
EGFR (African American): >60
Glucose: 117 mg/dL — ABNORMAL HIGH (ref 65–99)
Osmolality: 278 (ref 275–301)
Potassium: 3.8 mmol/L (ref 3.5–5.1)
SODIUM: 139 mmol/L (ref 136–145)

## 2014-05-07 LAB — CBC WITH DIFFERENTIAL/PLATELET
Basophil #: 0 10*3/uL (ref 0.0–0.1)
Basophil %: 0.6 %
EOS ABS: 0 10*3/uL (ref 0.0–0.7)
Eosinophil %: 0.1 %
HCT: 37.7 % (ref 35.0–47.0)
HGB: 12.3 g/dL (ref 12.0–16.0)
LYMPHS ABS: 0.7 10*3/uL — AB (ref 1.0–3.6)
Lymphocyte %: 9.5 %
MCH: 32.5 pg (ref 26.0–34.0)
MCHC: 32.7 g/dL (ref 32.0–36.0)
MCV: 99 fL (ref 80–100)
Monocyte #: 0.2 x10 3/mm (ref 0.2–0.9)
Monocyte %: 2.8 %
NEUTROS PCT: 87 %
Neutrophil #: 6.6 10*3/uL — ABNORMAL HIGH (ref 1.4–6.5)
PLATELETS: 297 10*3/uL (ref 150–440)
RBC: 3.8 10*6/uL (ref 3.80–5.20)
RDW: 13 % (ref 11.5–14.5)
WBC: 7.6 10*3/uL (ref 3.6–11.0)

## 2014-05-08 LAB — CULTURE, BLOOD (SINGLE)

## 2014-05-26 ENCOUNTER — Emergency Department: Payer: Self-pay | Admitting: Emergency Medicine

## 2014-06-08 ENCOUNTER — Emergency Department: Payer: Self-pay | Admitting: Emergency Medicine

## 2014-06-12 ENCOUNTER — Inpatient Hospital Stay: Payer: Self-pay | Admitting: Internal Medicine

## 2014-06-27 ENCOUNTER — Other Ambulatory Visit: Payer: Self-pay

## 2014-06-27 NOTE — Patient Outreach (Signed)
[facility] Care Management  06/27/2014  Julie Foley Julie Foley [id number]   Assessment: RN CM spoke with patient about her response to EMMI COPD GOLD RED questions asked of her.  Patient responded that she has used her rescue inhalers 5 times within a 24 hours period.  Patient stated that actually she had used her inhalers 7 times.  Patient states she has had COPD since 2007 and understood how to use her medications.  Patient has three different rescue inhalers prescribed for her.  Patient also has three inhalers that she is scheduled to use.  Patient is on 4 LPM/[place] home oxygen.  Patient states she understands what being in the COPD GOLD program entails. Patient states she has read the COPD booklet given to her and understands about the different zones.  Patient [place] being in the [place] today.  Patient states she gets anxious when she can't breathe and has anxiety panic attacks.  Patient states she takes Xanax for this but is out of the medication.  Patient [facility].  States [facility]. Patient states she has [facility].  Patient states today she is calling [facility] to establish a primary physician. Plan: Advise only:  RN CM reviewed COPD Action Plan with this patient.  Patient states she understands.  [facility].  Patient does not have a eligible payor source.  RN CM will close this case.  Julie Lawmanheryl [name], RN, Lowella DellMHA, [facility] Telephonic Care Coordinator [phone]

## 2014-07-04 ENCOUNTER — Emergency Department: Admit: 2014-07-04 | Discharge status: Against Medical Advice | Payer: Self-pay | Admitting: Student

## 2014-07-04 LAB — COMPREHENSIVE METABOLIC PANEL
ALT: 22 U/L
AST: 22 U/L
Albumin: 4.8 g/dL
Alkaline Phosphatase: 56 U/L
Anion Gap: 8 (ref 7–16)
BUN: 12 mg/dL
Bilirubin,Total: 0.5 mg/dL
CO2: 30 mmol/L
Calcium, Total: 9.4 mg/dL
Chloride: 103 mmol/L
Creatinine: 0.77 mg/dL
Glucose: 121 mg/dL — ABNORMAL HIGH
Potassium: 4.2 mmol/L
Sodium: 141 mmol/L
Total Protein: 7.3 g/dL

## 2014-07-04 LAB — CBC
HCT: 40.9 % (ref 35.0–47.0)
HGB: 13.2 g/dL (ref 12.0–16.0)
MCH: 32.6 pg (ref 26.0–34.0)
MCHC: 32.4 g/dL (ref 32.0–36.0)
MCV: 101 fL — ABNORMAL HIGH (ref 80–100)
PLATELETS: 380 10*3/uL (ref 150–440)
RBC: 4.06 10*6/uL (ref 3.80–5.20)
RDW: 14.9 % — AB (ref 11.5–14.5)
WBC: 9.4 10*3/uL (ref 3.6–11.0)

## 2014-07-04 LAB — CK TOTAL AND CKMB (NOT AT ARMC)
CK, TOTAL: 209 U/L
CK-MB: 11.1 ng/mL — AB

## 2014-07-04 LAB — PRO B NATRIURETIC PEPTIDE: B-Type Natriuretic Peptide: 21 pg/mL

## 2014-07-04 LAB — LACTIC ACID, PLASMA: Lactic Acid, Venous: 1 mmol/L

## 2014-07-04 LAB — TROPONIN I: Troponin-I: <0.03 ng/mL

## 2014-07-04 LAB — APTT: Activated PTT: 28.8 secs (ref 23.6–35.9)

## 2014-07-04 LAB — PROTIME-INR
INR: 0.9
Prothrombin Time: 12.8 secs

## 2014-07-09 LAB — CULTURE, BLOOD (SINGLE)

## 2014-07-12 ENCOUNTER — Inpatient Hospital Stay
Admit: 2014-07-12 | Disposition: A | Discharge status: Home / Self Care | Payer: Self-pay | Attending: Internal Medicine | Admitting: Internal Medicine

## 2014-07-12 LAB — COMPREHENSIVE METABOLIC PANEL
ALK PHOS: 53 U/L
ALT: 23 U/L
AST: 20 U/L
Albumin: 4.7 g/dL
Anion Gap: 6 — ABNORMAL LOW (ref 7–16)
BILIRUBIN TOTAL: 0.5 mg/dL
BUN: 19 mg/dL
CALCIUM: 9.9 mg/dL
CHLORIDE: 106 mmol/L
CO2: 31 mmol/L
Creatinine: 1.02 mg/dL — ABNORMAL HIGH
EGFR (African American): >60
Glucose: 132 mg/dL — ABNORMAL HIGH
POTASSIUM: 3.9 mmol/L
SODIUM: 143 mmol/L
Total Protein: 7.5 g/dL

## 2014-07-12 LAB — CBC
HCT: 43.4 % (ref 35.0–47.0)
HGB: 13.9 g/dL (ref 12.0–16.0)
MCH: 32.4 pg (ref 26.0–34.0)
MCHC: 31.9 g/dL — ABNORMAL LOW (ref 32.0–36.0)
MCV: 102 fL — AB (ref 80–100)
Platelet: 297 10*3/uL (ref 150–440)
RBC: 4.27 10*6/uL (ref 3.80–5.20)
RDW: 14.5 % (ref 11.5–14.5)
WBC: 9.4 10*3/uL (ref 3.6–11.0)

## 2014-07-12 LAB — TROPONIN I: Troponin-I: <0.03 ng/mL

## 2014-07-21 NOTE — Discharge Summary (Signed)
PATIENT NAME:  Julie Foley, Julie Foley MR#:  [id number] DATE OF BIRTH:  [birth year]  DATE OF ADMISSION:  02/18/2013 DATE OF DISCHARGE:  02/19/2013  The patient walked out AGAINST MEDICAL ADVICE on 02/19/2013.   CHIEF COMPLAINT:  Shortness of breath.   DIAGNOSES at the time of the patient walking out AGAINST MEDICAL ADVICE:  1.  Acute on chronic respiratory failure secondary to acute chronic obstructive pulmonary disease exacerbation.  2.  A history of chronic respiratory failure on 4 L of oxygen. 3.  Hypertension.  MEDICATIONS at time of the patient walking out AGAINST MEDICAL ADVICE:  1.  Amlodipine 5 mg daily.  2.  Diltiazem 30 mg q.6 hours.  3.  Enalapril 20 mg daily. 4.  Hydrochlorothiazide 5 mg daily.  5.  Claritin 10 mg daily.  6.  Solu-Medrol 80 mg q.8 hours.  7.  Montelucast 10 mg daily.  8.  Protonix 40 mg daily.  9.  Nebulizers 3 mL q.4 hours while awake.  10.  Flovent 1 puff b.i.d. 11.  Advair 500/50 mcg b.i.d.  12.  Spiriva 18 mcg inhaled one cap daily.   SIGNIFICANT LABS AND IMAGING:  Initial BUN 6, creatinine 0.71, sodium 140, potassium 4.3. Troponin was negative. No leukocytosis on admission or the following day. Her hemoglobin was 12.8. Blood cultures since 11/21 no growth to date. Initial ABG showed pH of 7.3, pCO2 of 64, pO2 of 76, and initial x-ray of the chest showed the lungs being hyperinflated, but no infiltrate or effusion.  HISTORY OF PRESENT ILLNESS AND HOSPITAL COURSE:  For full details of H and P, please see the dictation by Dr. Luberta Mutter on 11/21, but briefly this is a 59 year old female with a history of chronic respiratory failure on 4 liters of oxygen who came in for shortness of breath,  started on a BiPAP as she was wheezy in tripod position. The patient has been having some shortness of breath for a couple of days. She was admitted to the hospitalist service. She was continued on BiPAP and admitted to CCU. The patient did become somewhat worse per dictation by Dr.  Luberta Mutter, who then advised intubation, which the patient refused. The patient was maintained on vancomycin and Zosyn. The patient, of note, had no significant fever or leukocytosis. The following day when I went to see the patient, the patient had already talked to the nurse that she had wanted me to discharge the patient and then when I went into the room, she was actually sitting on the commode, off BiPAP, talking in full sentences, but was tachycardic and still wheezy; however, she stated that she will not stay in the hospital as it is her daughter's 20th birthday. She still was having moderate wheezing, but advised that she will not be discharged today; however, the patient left AGAINST MEDICAL ADVICE after verbalized understanding that she could to get worse and probably might get worse and have worsened respiratory failure and even death. The patient stated that she had been running out of oxygen and has cut back on 2 L and was outside and got exposed the cold and became short of breath. She stated that she still has all of her nebulizers, and they just brought in more oxygen the day before. Again she was urged not to walk AGAINST MEDICAL ADVICE, but ultimately she went AGAINST MEDICAL ADVICE. Given her tenuous status yesterday I went ahead and gave her a prednisone taper and a prescription for azithromycin. I do not think she has a  pneumonia. She has gotten significantly better, talking in full sentences. I think the possibility of bronchitis was there, therefore the azithromycin. I did mention that if she feels worsening in any way to call 911 right away and she verbalize understanding.  ON THE DAY OF DISCHARGE: VITAL SIGNS:  The patient had no temperature, pulse rate was 110, respiratory rate of 19, blood pressure 119/71, oxygen saturation 96% on her outpatient 4 L. She was talking in full sentences, generally not acutely distressed.  NECK:  No JVD.  CARDIOVASCULAR:  She is tachycardic. No murmurs,  rubs or gallops.  LUNGS:  The patient has moderate wheezing, no crackles, decent air entry.  ABDOMEN:  Soft, nontender.  EXTREMITIES:  No significant lower extremity edema.  PSYCHIATRIC:  Awake, alert, oriented x 3, cooperative but insisting on wanting to be discharged today.   TOTAL TIME SPENT:  40 minutes.     CODE STATUS:  The patient is FULL CODE.   ____________________________ Krystal EatonShayiq Dagny Fiorentino, MD sa:jm D: 02/19/2013 13:09:10 ET T: 02/19/2013 13:30:18 ET JOB#: [id number]  cc: Krystal EatonShayiq Ladeana Laplant, MD, <Dictator> Krystal EatonSHAYIQ Lara Palinkas MD ELECTRONICALLY SIGNED 03/05/2013 19:10

## 2014-07-21 NOTE — H&P (Signed)
PATIENT NAME:  Julie Foley, [name] MR#:  [id number] DATE OF BIRTH:  [birth year]  DATE OF ADMISSION:  02/18/2013  ADDENDUM: The patient did have a blood gas. ABG shows 7.30, CO2 64, and PO2 is 76. This is done on 4 liters of nasal cannula. So we are going to continue BiPAP and monitor in CCU stepdown, probably see how she does.  ____________________________ Katha HammingSnehalatha Kaelea Gathright, MD sk:sb D: 02/18/2013 11:39:42 ET T: 02/18/2013 12:25:38 ET JOB#: [id number]  cc: Katha HammingSnehalatha Askari Kinley, MD, <Dictator> Katha HammingSNEHALATHA Bethania Schlotzhauer MD ELECTRONICALLY SIGNED 03/09/2013 22:32

## 2014-07-21 NOTE — H&P (Signed)
PATIENT NAME:  Julie Foley, Julie Foley MR#:  [id number] DATE OF BIRTH:  06-08-1955  DATE OF ADMISSION:  03/19/2013  CHIEF COMPLAINT: Shortness of breath.   REFERRING PHYSICIAN: Dr. Sharyn Creamer   PULMONOLOGIST:  At [facility], Dr. Annye Asa  HISTORY OF PRESENT ILLNESS:  A 59 year old female recently admitted on 02/18/2013, discharged on 02/19/2013 with significant shortness of breath and COPD exacerbation, comes today complaining of more shortness of breath for the past couple of days. She has chronic respiratory failure, for what she uses 4 liters of oxygen at home. Today she called EMS with significant shortness of breath, complaining of increased cough and wheezing. EMS found her to have an oxygen saturation of 84% in the tripod position, for what they offered BiPAP. She refused it. Here in the ER, we offered her intubation versus BiPAP. She refused intubation and went into BiPAP straight up. The patient denies any fever, any chills, any signs of infection. She says that her medications were ordered via mail and she has not been able to receive them. The patient is admitted for treatment of acute respiratory failure with BiPAP.  REVIEW OF SYSTEMS:  CONSTITUTIONAL: No fever. Positive fatigue. No weight loss or weight gain.  EYES: No blurry vision or double vision.  ENT: No difficulty swallowing. No tinnitus.  RESPIRATORY: Positive cough. Positive dyspnea. Positive wheezing. No increased secretions. No hemoptysis.  CARDIOVASCULAR: No chest pain, orthopnea or syncope.  GASTROINTESTINAL: No nausea, vomiting. No abdominal pain.  GYNECOLOGIC: No breast masses.  GENITOURINARY: No dysuria or hematuria.  ENDOCRINE: No polyuria, polydipsia, polyphagia.  HEMATOLOGIC AND LYMPHATIC: No anemia or easy bruising.  MUSCULOSKELETAL: No significant neck pain or back pain or gout.  NEUROLOGIC: No numbness, tingling, CVA or TIA.   PSYCHIATRIC: No insomnia or depression.   PAST MEDICAL HISTORY: 1.  Chronic respiratory failure, 4  liters oxygen nasal cannula.  2.  COPD.   3.  Hypertension.   ALLERGIES: SYMBICORT.   FAMILY HISTORY: Positive for brain cancer in her mother. Her dad died from gunshot complications. Her daughter has asthma.   PAST SURGICAL HISTORY:  BTL.  SOCIAL HISTORY: The patient used to smoke more than 1 to 2 packs a day, but she is down to 2 cigarettes for the past couple of months. Smoking cessation counseling given to the patient for 4 minutes. She states that she knows she needs to quit smoking. She does not drink alcohol on a regular basis, only occasionally. She does not use drugs. She lives with her friend/boyfriend.   CURRENT MEDICATIONS: Include Spiriva 18 mcg daily, QVAR 80 mcg twice daily, Proventil as needed for shortness of breath, Nitrostat as needed for chest pain, montelukast 10 mg once a day, loratadine 10 mg once a day, hydrochlorothiazide 25 mg once a day, Nasacort once a day, famotidine 20 mg twice daily, enalapril 20 mg once a day, Daliresp 500 mg once daily, azithromycin 250 mg once daily, amlodipine 5 mg once daily, albuterol with ipratropium as needed for shortness of breath, and Advair Diskus 500 mcg over 50 mcg twice daily.   PHYSICAL EXAMINATION: VITAL SIGNS: Heart rate is 123, respirations 32, blood pressure 137/98, oxygen saturation 84% on room air by EMS, temperature 97.8.  GENERAL: The patient is alert, oriented x 3. Positive respiratory distress. The patient is in tripod position on a BiPAP. Once the BiPAP was removed, the patient continues to have significant respiratory distress, and she is hypoxic. The patient is using a lot of accessory muscles.  HEENT:  Her  pupils are equal and reactive. Extraocular movements are intact. Mucosa are moist. Anicteric sclerae. Pink conjunctivae. No oral lesions. No oropharyngeal exudates.  NECK: Supple. No JVD. No thyromegaly. No adenopathy. No carotid bruits. No rigidity.  CARDIOVASCULAR: Regular rate and rhythm. No murmurs, rubs or  gallops. No displacement of PMI.  LUNGS: Positive significant respiratory distress, use of accessory muscles, rhonchi and wheezing diffuse all over respiratory fields.  ABDOMEN:  Soft, nontender, nondistended. No hepatosplenomegaly. No masses.  GENITOURINARY:  Exam deferred.  EXTREMITIES: No edema, cyanosis or clubbing. Pulses +2. Capillary refill less than 3. LYMPHATIC: Negative for lymphadenopathy in neck or supraclavicular areas.  SKIN: No rashes, petechiae or new lesions.  NEUROLOGIC:  Cranial nerves II to XII intact. Strength is 5/5 in 4 extremities. No focal findings.  PSYCHIATRIC: Alert, oriented x 3. No agitation.   RESULTS:  Glucose 111, creatinine 0.68, sodium 141, potassium 3.8. Troponin 0.02. White count is 5.6, hemoglobin 13.2, platelet count 307.   Chest x-ray: No acute disease, only COPD changes.   ASSESSMENT AND PLAN: This is a very nice, 59 year old female with bad chronic obstructive pulmonary disease, chronic respiratory failure, admitted for COPD exacerbation and acute respiratory failure.   1.  Acute respiratory failure. This is secondary to her bad chronic obstructive pulmonary disease. The patient admitted on BiPAP due to significant hypoxia, despite the fact that she was on 4 liters nasal cannula. Continue antibiotics with Levaquin for possible infection to help the prognosis of this exacerbation. Continue steroids every 6 hours, IV Solu-Medrol. Continue nebulizers every 4 hours with albuterol and Atrovent, and continue her home inhalers. No signs of acute infiltrates or pneumonia this moment.   2.  Chronic obstructive pulmonary disease, with chronic respiratory failure. Continue Daliresp, continue Dulera, continue Advair and Spiriva.   3.  Pulmonary toilet. Follow up with primary pulmonologist in the next week.   4. Hypertension. The patient is stable. Continue enalapril and amlodipine, hydrochlorothiazide.   5.  Gastrointestinal prophylaxis with proton pump  inhibitor. Deep vein thrombosis prophylaxis with subcutaneous heparin.  Critical care time of 50 minutes, as patient is in acute respiratory failure requiring BiPAP. Critically ill, with potential of intubation overnight.   The patient is a FULL CODE.   I spent about 45 minutes with this critical care patient.    ____________________________ Felipa Furnaceoberto Sanchez Gutierrez, MD rsg:mr D: 03/19/2013 21:18:47 ET T: 03/19/2013 22:01:56 ET JOB#: [id number]  cc: Felipa Furnaceoberto Sanchez Gutierrez, MD, <Dictator> Khristie Sak Juanda ChanceSANCHEZ GUTIERRE MD ELECTRONICALLY SIGNED 03/21/2013 15:01

## 2014-07-21 NOTE — H&P (Signed)
PATIENT NAME:  Julie Foley, Julie Foley MR#:  [id number] DATE OF BIRTH:  [birth year]  DATE OF ADMISSION:  02/18/2013  PRIMARY CARE PHYSICIAN: Somewhere out of town.  ER PHYSICIAN: Governor Rooks, MD  CHIEF COMPLAINT: Shortness of breath.   HISTORY OF PRESENT ILLNESS: The patient is a 59 year old female patient with oxygen-dependent COPD who came in because of shortness of breath. The patient started to have shortness of breath for the past 2 days, getting progressively worse. The patient tried albuterol and Atrovent nebulizer at home with no relief. In the ER, the patient was in tripod position with diffuse wheezing. The patient was started on BiPAP , still wheezing, in tripod position. The patient says that since last 2 days shortness of breath is worse associated with chest tightness and also has cough with green phlegm. No fever. The patient denies any pedal edema. No orthopnea. No PND.  PAST MEDICAL HISTORY: Significant for hypertension. History of COPD. She is on 4 liters of oxygen since 2008. The patient also has a pulmonologist, Dr. Annye Asa, at [facility]. The patient was intubated in 2011 because of COPD flare. She was on [facility] at [facility]. Past medical history also significant for history of hypertension and COPD.  ALLERGIES: SYMBICORT.  SOCIAL HISTORY: She used to smoke heavy, now cut down to 2 cigarettes a day. Occasional alcohol. No drugs. Lives with a friend.   PAST SURGICAL HISTORY: Significant for tubal ligation.   FAMILY HISTORY: Significant for mother died of brain cancer and dad died of gunshot wound. The patient's daughter has asthma.  MEDICATIONS:  1.  Advair Diskus 500/50 one puff b.i.d. 2.  Albuterol with Atrovent nebulizer. 3.  Amlodipine 5 mg daily.  4.  Daliresp 500 mcg 1 tablet daily.  5.  Enalapril 20 mg p.o. daily.  6.  Fluticasone propionate 1 spray once a day.  7.  HCTZ 25 mg p.o. daily.  8.  Singulair 10 mg p.o. daily.  9.  Loratadine 10 mg p.o. daily.  10.  Nitrostat  sublingual 0.4 mg daily.  11.  Q-Var 80 mcg 1 puff b.i.d.  12.  Spiriva 18 mcg inhalation daily.  REVIEW OF SYSTEMS: CONSTITUTIONAL: She feels tired. Denies any fever. EYES: No blurred vision. No glaucoma.  ENT: No tinnitus. No epistaxis. No difficulty swallowing. Does have seasonal allergies. RESPIRATORY: Has cough, shortness of breath for 3 days, getting worse. Has history of COPD, on oxygen 4 liters all the time. Has diffuse wheezing at this time.  CARDIOVASCULAR: Feels chest tightness because of COPD. No orthopnea. No PND. No palpitations.  GASTROINTESTINAL: No nausea. No vomiting. No abdominal pain.  GENITOURINARY: No dysuria.  ENDOCRINE: No polyuria or nocturia.  INTEGUMENT: No skin rashes.  MUSCULOSKELETAL: No joint pain.  NEUROLOGIC: No numbness or weakness.  PSYCHIATRIC: No anxiety or insomnia.   PHYSICAL EXAMINATION:  VITAL SIGNS: Temperature 98.9, heart rate 116, blood pressure initially 190/100. Sats 97% on 4 liters. GENERAL: The patient is alert, awake, and oriented. Unable to talk full sentences because of shortness of breath and she is in tripod position. She is a well-developed, well-nourished female.  HEAD: Normocephalic, atraumatic.  EYES: Pupils equally reacting to light. No conjunctival pallor. NOSE: No nasal lesions. No drainage.  EARS: No tympanic membrane congestion. No external lesions. MOUTH: As I mentioned, clinically dry. No exudates.  NECK: Supple. No JVD. No carotid bruit. Normal range of motion. Thyroid is in the midline.  LUNGS: The patient is having diffuse expiratory wheeze in all lung fields.  Not using accessory muscles of respiration at this time.  HEART: S1 and S2 regular, tachycardic. The patient has no peripheral edema. Pulses equal in carotid and femoral and pedal.  ABDOMEN: Obese. Bowel sounds present. No organomegaly. No hepatosplenomegaly. No tenderness.  MUSCULOSKELETAL: Extremities: Able to move x4. Normal range of motion.  SKIN: Warm and  dry. No diaphoresis. No obvious wounds.  LYMPHATICS: No cervical lymphadenopathy. No lymphadenopathy.  VASCULAR: Good pedal pulses.  NEUROLOGIC: Cranial nerves II through XII intact. Power 5/5 in upper and lower extremities. Sensations are intact. DTRs 2+ bilaterally. Straight leg raising test is not done because of her shortness of breath.  PSYCHIATRIC: Judgment and insight are adequate.   LABORATORY AND DIAGNOSTICS:  EKG shows sinus tach with some PVCs, 113 beats per minute. Chest x-ray shows hyperinflated lungs with no infiltrates.   WBC 6.8, hemoglobin 12.8, hematocrit 38.1, platelets 311. Electrolytes: Sodium 140, potassium 4.3, chloride 105, bicarb 33, BUN 6 creatinine 0.71, glucose 105. Troponin less than 0.02. BNP 65.    ABG is pending.   ASSESSMENT AND PLAN: 1.  The patient is a 59 year old female with acute on chronic respiratory failure secondary to COPD exacerbation. The patient right now is on BiPAP 12/6 with 25% FiO2. Continue that and continue Solu-Medrol at 60 mg IV q. 8 hours along with DuoNebs q. 2 hours p.r.n. Broad-spectrum antibiotics should be given with IV vanco and Zosyn. The patient's chest x-ray is clear, but the patient says that she has some green phlegm and shortness of breath. The patient will be admitted to CCU step-down for impending respiratory failure. Monitor closely. The patient says that she is okay with intubation if she does not respond to BiPAP.  2.  Hypertension, which is accelerated on admission, likely due to respiratory distress. The patient on amlodipine, enalapril, and HCTZ. Continue them. Monitor on telemetry.  3.  Severe chronic obstructive pulmonary disease. The patient is on Daliresp. Continue that. Obtain pulmonary consult with Dr. Belia HemanKasa. 4.  Tobacco abuse. Counseled about smoking cessation for 5 minutes. The patient says that she is in the process of quitting. Continue Spiriva 18 mcg inhalation daily.  5.  Seasonal allergies. She is on Singulair and  Claritin. continue that.  This is a critical care history and physical. Admit to CCU step-down. Discussed the plan with the patient.  TIME SPENT: 60 minutes.    ____________________________ Katha HammingSnehalatha Loany Neuroth, MD sk:sb D: 02/18/2013 11:35:20 ET T: 02/18/2013 11:54:09 ET JOB#: [id number]  cc: Katha HammingSnehalatha Abeni Finchum, MD, <Dictator> Katha HammingSNEHALATHA Roy Snuffer MD ELECTRONICALLY SIGNED 03/09/2013 22:31

## 2014-07-22 NOTE — H&P (Signed)
PATIENT NAME:  Julie Foley, Chiant[place] L MR#:  [id number] DATE OF BIRTH:  1955/09/27  DATE OF ADMISSION:  11/29/2013  ADMITTING PHYSICIAN: Enid Baasadhika Tosca Pletz, MD.    PRIMARY CARE PHYSICIAN: [facility].  CHIEF COMPLAINT: Difficulty breathing.   HISTORY OF PRESENT ILLNESS: Ms. Julie Foley is a 59 year old African American female with past medical history significant for chronic respiratory failure secondary to COPD, on 4 liters home oxygen, ongoing smoking, cocaine abuse, hypertension, who was in the hospital from 11/26/2013 and just got discharged on 11/28/2013 for the same diagnosis, went home, comes back overnight again with difficulty breathing. The patient is currently on BiPAP, unable to provide any history, but apparently she felt fine at the time of discharge. A urine toxicology came back positive for cocaine, so not sure if she was smoking cocaine as soon as she went home, comes with respiratory distress. She is tachypneic and dyspneic, admitted to CCU for the same.   PAST MEDICAL HISTORY:  1.  Chronic obstructive pulmonary secondary to COPD, on 4 liters oxygen.  2.  Gastroesophageal reflux disease.  3.  Hypertension.    PAST SURGICAL HISTORY: None.   ALLERGIES TO MEDICATIONS: SYMBICORT AND BUDESONIDE.     CURRENT HOME MEDICATIONS: The patient was discharged on the following medications:  1.  Enalapril 20 mg p.o. b.i.d.  2.  Advair 500/50, one puff b.i.d.  3.  Xanax 0.5 mg p.o. q. 8 hours p.o. 1 to 2 times a day p.r.n. for anxiety.  4.  Azithromycin 250 mg p.o. daily.  5.  Norvasc 5 mg p.o. daily.  6.  Daliresp 500 mcg p.o. daily.  7.  DuoNebs 3 mL 4 times a day p.r.n. for wheezing.  8.  Famotidine 20 mg p.o. b.i.d.  9.  Flonase nasal spray 1 puff daily.  10.   Hydrochlorothiazide 25 mg p.o. daily.  11.   Loratadine 10 mg p.o. daily.  12.   Singulair 10 mg p.o. daily.  13.   Prednisone taper.  14.   Proventil inhaler.  15.   QVAR 80 mcg inhaler, 1 puff b.i.d.   SOCIAL HISTORY: Lives at home with  her significant other. Continues to smoke and also uses cocaine. Occasional alcohol use.   FAMILY HISTORY: Not known at this time.   REVIEW OF SYSTEMS: Unable to be obtained as patient is on BiPAP and with altered mental status.    PHYSICAL EXAMINATION: VITAL SIGNS: Temperature 97.8 degrees Fahrenheit, pulse 130, respirations 20, blood pressure 145/128, pulse oximetry 92% on BiPAP, 100% FiO2.  GENERAL: Well-built, well-nourished female sitting in bed. Appears to be in acute respiratory distress.  HEENT: Normocephalic, atraumatic. Pupils equal, round, and reacting to light. Anicteric sclerae. Extraocular movements intact. Oropharynx clear without erythema, mass, or exudates.  NECK: Supple. No thyromegaly, JVD, or carotid bruits. No lymphadenopathy.  LUNGS: The patient is wheezing diffusely bilaterally. No crackles heard. No rhonchi. Use of accessory muscles noted.  CARDIOVASCULAR: S1, S2, regular rate and rhythm. She had a systolic murmur heard. No rubs or gallops.  ABDOMEN: Soft, nontender, nondistended. No hepatosplenomegaly. Normal bowel sounds.  EXTREMITIES: No pedal edema. No clubbing or cyanosis, 2+ dorsalis pedis pulses palpable bilaterally.  SKIN: No acne, rash, or lesions.  LYMPHATICS: No cervical lymphadenopathy.  NEUROLOGIC: The patient is arousable, able to follow commands. No significant motor or sensory defects noted. No obvious cranial nerve deficits noted.   PSYCHOLOGICAL: She is arousable and seems to be alert. I am not sure of the orientation. A complete psychiatric and neurologic assessment  could not be done because of patient's mental status.    LABORATORY DATA: ABG showing pH of 7.28, pCO2 of 71, pO2 106, bicarbonate of 33.4 and saturation of 97% on 30% FiO2. A urine toxicology screen positive for cocaine, opioids, and also benzodiazepines.   WBC 15.0, hemoglobin 12.7, hematocrit 39.6, platelet count 322,000.   Sodium 136, potassium 4.6, chloride 99, bicarbonate 32, BUN  15, creatinine 0.84, glucose 110, and calcium of 9.1.   Chest x-ray showing pulmonary hyperinflation, clear lung fields. No acute changes noted.   ASSESSMENT AND PLAN: This is a 59 year old female with history of chronic respiratory failure secondary to chronic obstructive pulmonary disease, on 4 liters home oxygen, hypertension and noncompliance, cocaine abuse who was just discharged, comes back with acute  respiratory failure secondary to chronic obstructive pulmonary disease exacerbation.  1.  Acute on chronic respiratory failure secondary to chronic obstructive pulmonary disease exacerbation. Just discharged yesterday. Likely noncompliance and also substance abuse contributed. Admit to CCU, as she appears critically ill. Continue BiPAP at this time. Pulmonary has been consulted. The patient is a full code. Repeat ABG. On BiPAP. If gets worse, then will need intubation. Started on IV steroids, nebulizers, inhalers. Continue azithromycin, as chest x-ray does not show any significant changes. Pulmonary  has been consulted.  2.  Hypertension. Continue Norvasc, enalapril.  3.  Gastroesophageal reflux disease, on Pepcid.  4.  Anxiety,  Xanax.  5.  Cocaine abuse and smoking-  Counseling prior to discharge.   CODE STATUS: Full code.   TOTAL CRITICAL CARE TIME SPENT ON ADMITTING THIS PATIENT: 60 minutes.    ____________________________ Enid Baas, MD rk:at D: 11/29/2013 14:40:16 ET T: 11/29/2013 15:41:03 ET JOB#: [id number]  cc: Enid Baas, MD, <Dictator> Enid Baas MD ELECTRONICALLY SIGNED 12/01/2013 14:09

## 2014-07-22 NOTE — Discharge Summary (Signed)
PATIENT NAME:  Julie Foley, Julie Foley MR#:  [id number] DATE OF BIRTH:  Nov 18, 1955  DATE OF ADMISSION:  03/19/2013 DATE OF DISCHARGE:  03/20/2013  ADMITTING PHYSICIAN:  [place] Sink, MD  DISCHARGING PHYSICIAN:  Gladstone Lighter, MD  PRIMARY CARE PHYSICIAN:  The patient's primary MD is at [facility].   [place]: None.   DISCHARGE DIAGNOSES:  1.  Acute on chronic obstructive pulmonary disease exacerbation.  2.  Chronic respiratory failure secondary to chronic obstructive pulmonary disease, on 4 liters home oxygen.  3.  Hypertension.  4.  Gastroesophageal reflux disease.   DISCHARGE HOME MEDICATIONS:  1.  Nitroglycerin sublingual 0.4 mg p.r.n. for chest pain.  2.  Loratadine 10 mg p.o. daily.  3.  Amlodipine 5 mg p.o. daily.  4.  HCTZ 25 mg p.o. daily.  5.  Famotidine 20 mg p.o. b.i.d.  6.  MEQASTM19 mcg inhalation capsule daily.  7.  Singulair 10 mg p.o. daily.  8.  Qvar 80 mcg inhalation 1 puff b.i.d.  9.  Daliresp 500 mcg p.o. daily.  10.  Proventil inhaler 2 puffs 4 times a day as needed for shortness of breath.  11.  Azithromycin 250 mg p.o. daily as a maintenance dose.  12.  DuoNebs with albuterol and ipratropium 3 mL q.4 hours p.r.n. for wheezing.  13.  Flonase 1 spray daily.  14.  Enalapril 20 mg p.o. b.i.d.  15.  Norco 5/325 mg 1 tablet q.6 hours for pain.  16.  Prednisone taper over 12 days.    DISCHARGE DIET: Low-sodium diet.   DISCHARGE ACTIVITY: As tolerated.   OXYGEN: 4 liters.   FOLLOWUP INSTRUCTIONS: 1.  Follow up with [facility] pulmonology in 2 weeks.  2.  The patient will also need a new PCP, please provide number for local clinics and also [facility] per her interest.    LABS AND IMAGING STUDIES PRIOR TO DISCHARGE: WBC is 5.1, hemoglobin 12.4, hematocrit 37.0, platelets count is 294.  Sodium is 137, potassium 3.9, chloride 101, bicarb 32, BUN 11, creatinine 0.83, glucose 188, calcium 9.6, ALT 30, AST is 21, alk phos 63, total bili  0.3 and albumin of 4.1. Magnesium is 2.0. Influenza antigens negative. Chest x-ray showing no active disease, lungs are clear. Blood cultures are negative on admission.   BRIEF HOSPITAL COURSE: Julie Foley is a 59 year old African American female with known history of chronic COPD, on 4 liters the home oxygen, hypertension and reflux disease, presents to the hospital secondary to worsening shortness of breath.  1.  Acute on chronic COPD exacerbation. The patient states that she h.s. been run out of her home inhalers including all her medications and has not been taking them for several days. She just got then filled 2 days ago prior to admission. By that time, her symptoms got worse so presented to the hospital. She was started on steroids, nebs, all her inhalers were continued. She feels well and back on 4 liters oxygen and wants to go home. She was ambulated with the help of respiratory therapist checking her oxygen, sats remained greater than 90% on ambulation so she is being discharged home. She does have some tightness with improved air entry and does not appear to be tachypneic so she is being discharged home.  Her antibiotics are being discontinued as there is no evidence of bronchitis or pneumonia and she is on maintenance azithromycin to prevent recurrent bronchitis attacks by her PCP.  She is being discharged on a slow prednisone  taper over the next 12 days.  2.  Hypertension. Continue her home medications.  3.  GERD.  The patient on famotidine which will be continued.   Her course has been otherwise uneventful in the hospital.   DISCHARGE CONDITION: Stable.   DISCHARGE DISPOSITION: Home.   TIME SPENT ON DISCHARGE: 45 minutes.   ____________________________ Gladstone Lighter, MD rk:cs D: 03/20/2013 12:57:00 ET T: 03/20/2013 19:46:50 ET JOB#: [id number]  cc: Gladstone Lighter, MD, <Dictator> Gladstone Lighter MD ELECTRONICALLY SIGNED 04/06/2013 7:17

## 2014-07-22 NOTE — H&P (Signed)
PATIENT NAME:  Julie Foley, SCHROEPFER MR#:  [id number] DATE OF BIRTH:  October 22, 1955  DATE OF ADMISSION:  08/31/2013  PRIMARY CARE PHYSICIAN: At [facility].   CHIEF COMPLAINT: Shortness of breath.   HISTORY OF PRESENT ILLNESS: This is a 59 year old female who presented to the hospital with acute onset shortness of breath that began yesterday, progressively getting worse. The patient has a history of underlying COPD and attempted to use her nebulizers and her inhalers at home, but her symptoms are not improving. She therefore came to the ER for further evaluation and was noted to be in severe respiratory distress and noted to be in acute hypoxic hypercapnic respiratory failure. She was urgently placed on BiPAP. She admits to a cough, which is productive with clear sputum, but no documented fever. No nausea. No vomiting. No abdominal pain. No other associated symptoms. No sick contacts. No other upper respiratory symptoms.   REVIEW OF SYSTEMS:    CONSTITUTIONAL: No documented fever. No weight gain. No weight loss.  EYES: No blurred or double vision.  EARS, NOSE, THROAT: No tinnitus. No postnasal drip. No redness of the oropharynx.  RESPIRATORY: Positive cough. Positive wheeze. No hemoptysis. Positive dyspnea. Positive COPD.  CARDIOVASCULAR: No chest pain. No orthopnea. No palpitations. No syncope.  GASTROINTESTINAL: No nausea. No vomiting. No diarrhea. No abdominal pain. No melena or hematochezia.  GENITOURINARY: No dysuria or hematuria.  ENDOCRINE: No polyuria, nocturia, heat or cold intolerance.  HEMATOLOGIC: No anemia, no bruising, no bleeding.  INTEGUMENTARY: No rashes. No lesions.  MUSCULOSKELETAL: No arthritis, no swelling, no gout.  NEUROLOGIC: No numbness. No tingling. No ataxia. No seizure type activity.  PSYCHIATRIC: No anxiety. No insomnia. No ADD.   PAST MEDICAL HISTORY: Consistent with COPD on home oxygen, hypertension, anxiety, GERD.   ALLERGIES: SYMBICORT, WHICH CAUSES SWELLING.    SOCIAL HISTORY: Used to be a smoker, quit many years ago. Does have a 20 to 30 pack-year smoking history. Occasionally drinks alcohol. No illicit drug abuse. Lives at home by herself.   FAMILY HISTORY: Both mother and father are deceased. Mother died from cancer of unknown type. Father was shot.   CURRENT MEDICATIONS: As follows: Advair 500/50 one puff b.i.d., albuterol nebulizers q.4 hours as needed, Norvasc 5 mg daily, Daliresp 500 mcg daily, DuoNebs 4 times daily as needed, enalapril 20 mg b.i.d., famotidine 20 mg b.i.d., Flonase one spray to each nostril daily, HCTZ 25 mg daily, loratadine 10 mg daily, Singulair 10 mg daily, albuterol inhaler 2 puffs q.4 hours as needed, QVAR 1 puff b.i.d., Spiriva 1 puff daily.   PHYSICAL EXAMINATION: Presently is as follows:  VITAL SIGNS: Temperature is 97.6, pulse 134, respirations 26, blood pressure 132/76, sats 100% on BiPAP.  GENERAL: She is a pleasant-appearing female in moderate respiratory distress.  HEAD, EYES, EARS, NOSE, THROAT: Atraumatic, normocephalic. Extraocular muscles are intact. Pupils equal and reactive to light. Sclerae are anicteric. No conjunctival injection. No pharyngeal erythema.  NECK: Supple. There is no jugular venous distention. No bruits, no lymphadenopathy, no thyromegaly.  HEART: Regular rate and rhythm. No murmurs, no rubs, no clicks.  LUNGS: She has positive use of accessory muscles. No dullness to percussion. Diffuse wheezing bilaterally. In mild to moderate respiratory distress.  ABDOMEN: Soft, flat, nontender, nondistended. Has good bowel sounds. No hepatosplenomegaly appreciated.  EXTREMITIES: No evidence of any cyanosis, clubbing, or peripheral edema. Has +2 pedal and radial pulses bilaterally.  NEUROLOGICAL: The patient is alert, awake, and oriented x 3 with no focal motor or sensory  deficits appreciated bilaterally.  SKIN: Moist and warm with no rashes appreciated.  LYMPHATIC: There is no cervical or axillary  lymphadenopathy.   LABORATORY AND RADIOLOGICAL DATA: Serum glucose of 85, BUN 12, creatinine 0.5, sodium 135, potassium 4.9, chloride 100, bicarbonate 31. The patient's LFTs are within normal limits. Troponin less than 0.02. White cell count 9.1, hemoglobin 13.7, hematocrit 41.9, platelet count 343. ABG showed a pH of 7.18, pCO2 of 95, pO2 of 62, sats 88%.   The patient's chest x-ray showed no evidence of acute cardiopulmonary disease.   ASSESSMENT AND PLAN: This is a 59 year old female with a history of chronic obstructive pulmonary disease on home oxygen, hypertension, anxiety, gastroesophageal reflux disease gastroesophageal reflux disease, who presented to the hospital due to shortness of breath and noted to be in chronic obstructive pulmonary disease exacerbation and in acute on chronic hypercapnic respiratory failure.  1.  Acute on chronic hypoxic hypercapnic respiratory failure. This is likely secondary to chronic obstructive pulmonary disease exacerbation. The patient has been placed on BiPAP, which I will continue for now. Follow serial arterial blood gases. Treat underlying chronic obstructive pulmonary disease with IV steroids, around-the-clock nebulizer treatments. Continue Advair, Spiriva. Add empiric Levaquin. Follow blood and sputum cultures and follow her clinically.  2,  Chronic obstructive pulmonary disease exacerbation, likely cause of the patient's shortness of breath and cough. Chest x-ray negative for pneumonia. Could be secondary to underlying acute bronchitis. Continue IV steroids, around-the-clock nebulizer treatments. Continue Advair and Spiriva and Levaquin. Follow sputum and blood cultures. Follow her clinically. We will follow serial blood gases. Consider pulmonary consult if the patient is not improving.  3.  Hypertension. Continue Norvasc and enalapril. The patient is hemodynamically stable.  4.  Gastroesophageal reflux disease. Continue Protonix.  5.  Anxiety. Continue  p.r.n. Xanax.   CODE STATUS: The patient is a full code.   CRITICAL CARE TIME SPENT: 60 minutes.    ____________________________ Rolly PancakeVivek J. Cherlynn KaiserSainani, MD vjs:jcm D: 08/31/2013 12:28:20 ET T: 08/31/2013 13:34:00 ET JOB#: [id number]  cc: Rolly PancakeVivek J. Cherlynn KaiserSainani, MD, <Dictator> [place] SirenVIVEK J Tienna Bienkowski MD ELECTRONICALLY SIGNED 09/03/2013 21:58

## 2014-07-22 NOTE — Discharge Summary (Signed)
PATIENT NAME:  Julie Foley, Julie Foley DATE OF BIRTH:  30-Dec-1955  DATE OF ADMISSION:  06/28/2013 DATE OF DISCHARGE:  06/29/2013  REASON FOR ADMISSION: Shortness of breath secondary to chronic obstructive pulmonary disease exacerbation.   DISPOSITION: Home at discharge, the patient was in stable and she is really wanting to go home, get discharged early. She is feeling better, and she is concerned for somebody robbing her apartment.   MEDICATIONS AT DISCHARGE: Loratadine 10 mg daily, amlodipine 5 mg daily, hydrochlorothiazide 25 mg, famotidine 20 mg twice daily, Spiriva 18 mcg once daily. Qvar 80 mcg 2 times a day, Daliresp 500 mg once a day, Proventil 90 mcg 4 times daily,  fluticasone nasal 50 mcg once per day 1 spray nasal once a day, enalapril 20 mg twice daily, albuterol 3 mL every four hours as needed, alprazolam 0.5 mg once a day p.r.n. anxiety, prednisone 60 mg, decrease 10 mg every day until gone. Stop Levaquin as soon as prescriptions is finished.    Follow with primary care physician in 1 to 2 weeks.   HOSPITAL COURSE: This is at 59 year old female with history of COPD complaining of shortness of breath. [facility], came on 03/31 for increased shortness of breath. The patient states that at home she is on 4 liters nasal cannula at baseline. She has hypertension, chronic respiratory failure, came up with significant wheezing after she was going outside and the grass was being cut. The patient was seen in the Emergency Department. She was given steroids and nebulizers. The patient did very well overnight, she actually was on 2 liters of oxygen when I saw her, but she says that every time that she gets up, she increases back to 4 and that is what she does at home.  The patient really wants to be discharged.   PHYSICAL EXAMINATION: VITAL SIGNS: The patient is alert, oriented x 3, in no acute distress. No respiratory distress. Hemodynamically stable.  HEENT: Her  pupils were equal and reactive. Extraocular movements were intact.  NECK: Supple.  CARDIOVASCULAR: Regular rate and rhythm. No murmurs, rubs  or gallops.   LUNGS: Actually really clear without any significant decrease on air entrance.  There was no significant wheezing or rales. Her blood pressure was 130/70. Her pulse was 108. Respiratory was 19. That pulse was done after she went up for a walk , but after recheck resting it was around 90.  No significant edema of the lower extremities.  ABDOMEN: Benign.   The patient was discharged in good condition with the medications mentioned above. I spent about 35 minutes discharging this patient. Prescription was called into [facility] steroids. The patient has a prescription for Levaquin that she is about to be done with. We will recommend to stop it now.   TIME SPENT: I spent 35 minutes.   ____________________________ Felipa Furnaceoberto Sanchez Gutierrez, MD rsg:sg D: 06/30/2013 07:19:57 ET T: 06/30/2013 08:01:44 ET JOB#: [id number]  cc: Felipa Furnaceoberto Sanchez Gutierrez, MD, <Dictator> Ndea Kilroy Juanda ChanceSANCHEZ GUTIERRE MD ELECTRONICALLY SIGNED 07/05/2013 21:12

## 2014-07-22 NOTE — Discharge Summary (Signed)
Dates of Admission and Diagnosis:  Date of Admission 23-Dec-2013   Date of Discharge 23-Dec-2013   Admitting Diagnosis COPD exacerbation   Final Diagnosis COPD exacerbation Acute on chronic respi failure Hypertension Anxiety    Chief Complaint/History of Present Illness a 59 year old African American female with past medical history of COPD on 4 liters nasal cannula at baseline, the patient has history of cocaine abuse, hypertension and as of last admission and discharge in September she was still smoking, but currently she denies any further smoking at this point, but the patient presents with complaints of shortness of breath, cough productive sputum, white in color, shortness of breath in the ED requiring BiPAP. The patient reports she was out of oxygen. Upon further questioning, she does have her concentrator which works well at home, but she was out of her portable oxygen though. The patient was afebrile.  Chest x-ray does not show any acute findings. She had decreased air entry upon presentation with significant wheezing, so she was started on steroids and nebulizer treatment and given her severe shortness of breath and requirements, hospitalists were requested to admit the patient. She denies any chest pain or any recent cocaine abuse.   Allergies:  budesonide-formoterol: Swelling  Symbicort: Swelling  Lab:  25-Sep-15 00:45   pH (ABG)  7.30 (7.350-7.450 NOTE: New Reference Range 10/22/13)  PCO2  66 (32-48 NOTE: New Reference Range 11/08/13)  PO2  158 (83-108 NOTE: New Reference Range 10/22/13)  FiO2 45  Base Excess  4.0 (-3-3 NOTE: New Reference Range 11/08/13)  HCO3  32.5 (21.0-28.0 NOTE: New Reference Range 10/22/13)  O2 Saturation 99.5  Specimen Site (ABG) RT RADIAL  Specimen Type (ABG) ARTERIAL  Patient Temp (ABG) 37.0  Lactic Acid, Arterial, Cardiopulmonary 0.6 (0.3-0.8 NOTE: New Reference Range 11/08/13)  Routine Chem:  25-Sep-15 00:31   Glucose, Serum  104  BUN  10  Creatinine (comp) 0.76  Sodium, Serum 143  Potassium, Serum 4.0  Chloride, Serum 106  CO2, Serum  33  Calcium (Total), Serum 9.0  Anion Gap  4  Osmolality (calc) 284  eGFR (African American) >60  eGFR (Non-African American) >60 (eGFR values <40m/min/1.73 m2 may be an indication of chronic kidney disease (CKD). Calculated eGFR, using the MRDR Study equation, is useful in  patients with stable renal function. The eGFR calculation will not be reliable in acutely ill patients when serum creatinine is changing rapidly. It is not useful in patients on dialysis. The eGFR calculation may not be applicable to patients at the low and high extremes of body sizes, pregnant women, and vetetarians.)    00:45   Result Comment - bipap 10/5 45%  - with neb tx 4L O2  Result(s) reported on 23 Dec 2013 at 12:59AM.  Urine Drugs:  [id number]  Tricyclic Antidepressant, Ur Qual (comp) NEGATIVE (Result(s) reported on 23 Dec 2013 at 01:55AM.)  Amphetamines, Urine Qual. NEGATIVE  MDMA, Urine Qual. NEGATIVE  Cocaine Metabolite, Urine Qual. POSITIVE  Opiate, Urine qual NEGATIVE  Phencyclidine, Urine Qual. NEGATIVE  Cannabinoid, Urine Qual. NEGATIVE  Barbiturates, Urine Qual. NEGATIVE  Benzodiazepine, Urine Qual. POSITIVE (----------------- The URINE DRUG SCREEN provides only a preliminary, unconfirmed analytical test result and should not be used for non-medical  purposes.  Clinical consideration and professional judgment should be  applied to any positive drug screen result due to possible interfering substances.  A more specific alternate chemical method must be used in order to obtain a confirmed analytical result.  Gas chromatography/mass spectrometry (GC/MS)  is the preferred confirmatory method.)  Methadone, Urine Qual. NEGATIVE  Cardiac:  25-Sep-15 00:31   Troponin I < 0.02 (0.00-0.05 0.05 ng/mL or less: NEGATIVE  Repeat testing in 3-6 hrs  if clinically indicated. >0.05 ng/mL:  POTENTIAL  MYOCARDIAL INJURY. Repeat  testing in 3-6 hrs if  clinically indicated. NOTE: An increase or decrease  of 30% or more on serial  testing suggests a  clinically important change)  Routine UA:  25-Sep-15 09:01   Color (UA) Yellow  Clarity (UA) Cloudy  Glucose (UA) 50 mg/dL  Bilirubin (UA) Negative  Ketones (UA) Negative  Specific Gravity (UA) 1.025  Blood (UA) 3+  pH (UA) 6.0  Protein (UA) 100 mg/dL  Nitrite (UA) Negative  Leukocyte Esterase (UA) Negative (Result(s) reported on 23 Dec 2013 at 09:20AM.)  RBC (UA) 5630 /HPF  WBC (UA) 21 /HPF  Bacteria (UA) 2+  Epithelial Cells (UA) 3 /HPF  Mucous (UA) PRESENT (Result(s) reported on 23 Dec 2013 at 09:20AM.)  Routine Hem:  25-Sep-15 00:31   WBC (CBC) 5.5  RBC (CBC)  3.74  Hemoglobin (CBC) 12.0  Hematocrit (CBC) 38.2  Platelet Count (CBC) 288 (Result(s) reported on 23 Dec 2013 at 12:46AM.)  MCV  102  MCH 32.1  MCHC  31.4  RDW 13.3   PERTINENT RADIOLOGY STUDIES: XRay:    25-Sep-15 00:42, Chest Portable Single View  Chest Portable Single View   REASON FOR EXAM:    SOB  COMMENTS:       PROCEDURE: DXR - DXR PORTABLE CHEST SINGLE VIEW  - Dec 23 2013 12:42AM     CLINICAL DATA:  COPD exacerbation with history of hypertension    EXAM:  PORTABLE CHEST - 1 VIEW    COMPARISON:  11/29/2013    FINDINGS:  Heart size within normal limits. Vascular pattern normal. No  consolidation or effusion. No change from prior study.   IMPRESSION:  No active disease.      Electronically Signed    By: Skipper Cliche M.D.    On: 12/23/2013 00:53         Verified By: Rachael Fee, M.D.,  LabUnknown:  PACS Image    Pertinent Past History:  Pertinent Past History 1. COPD.  2. Chronic respiratory failure, on 4 liters nasal cannula. 3. Gastroesophageal reflux disease.  4. Hypertension.   Hospital Course:  Hospital Course * COPD exacerbation   I had long discussion with her- said [place]- as she have some water leak in apartment- possible mould-    she is having recurrent worsenign of COPD.   Yesterday she ran out of portable oxygen and had to work with [employer]- arguments- so got  panic and felt very SOB , so callled EMS.   Given IV steroids, nebs, BIpap initially in ER- feels much better now- says she is at her baseline and ready to go.    Me and social worker seen the pt together- she gave her information about working with agencies about mould. and other options.   I also spoke to her about her coccaine- she refused using any- and told may be someone around her had smoked it.   will d/c today- give prednison long taper.  * acute on Chronic respiratory failure , as she was requiring BiPAP.    now on nasal canuila 4 ltr- baseline.   mainly anxiety may have been a major factor yesterday's worsening.  * History of hypertension.  Blood pressure  was borderline so continued her on Enalapril only.   held the rest , now BP stable- resume all on d/c.  * Gastroesophageal reflux disease. Continue with PPI.   Condition on Discharge Stable   Code Status:  Code Status Full Code   DISCHARGE INSTRUCTIONS HOME MEDS:  Medication Reconciliation: Patient's Home Medications at Discharge:     Medication Instructions  loratadine 10 mg oral tablet  1 tab(s) orally once a day   proventil hfa cfc free 90 mcg/inh inhalation aerosol  2 puff(s) inhaled 4 times a day, As Needed - for Shortness of Breath   enalapril 20 mg oral tablet  1 tab(s) orally 2 times a day   alprazolam 0.5 mg oral tablet  1 tab(s) orally 1 to 2 times a day, As Needed, anxiety , As needed, anxiety   daliresp 500 mcg oral tablet  1 tab(s) orally once a day   qvar 80 mcg/inh inhalation aerosol  1 puff(s) inhaled 2 times a day   fluticasone nasal 50 mcg/inh nasal spray  1 spray(s) nasal once a day   montelukast 10 mg oral tablet  1 tab(s) orally once a day (in the evening)    famotidine 20 mg oral tablet  1 tab(s) orally 2 times a day   hydrochlorothiazide 25 mg oral tablet  1 tab(s) orally once a day   amlodipine 5 mg oral tablet  1 tab(s) orally once a day   advair diskus 500 mcg-50 mcg inhalation powder  1 puff(s) inhaled 2 times a day   duoneb 2.5 mg-0.5 mg/3 ml inhalation solution  3 milliliter(s) inhaled 4 times a day, As Needed doesn't use spiriva when using duoneb   prednisone 10 mg oral tablet  Start at 60 mg and taper by 5 mg daily until complete.   levaquin 500 mg oral tablet  1 tab(s) orally every 24 hours x 4 days    STOP TAKING THE FOLLOWING MEDICATION(S):    azithromycin 250 mg oral tablet: 1 tab(s) orally once a day x 3 days  Physician's Instructions:  Diet Low Sodium   Activity Limitations As tolerated   Return to Work Not Applicable   Time frame for Follow Up Appointment 1-2 weeks  PMD   Other Comments follow with [facility] at [facility].   Electronic Signatures: Vaughan Basta (MD)  (Signed 28-Sep-15 21:40)  Authored: ADMISSION DATE AND DIAGNOSIS, CHIEF COMPLAINT/HPI, Allergies, PERTINENT LABS, PERTINENT RADIOLOGY STUDIES, PERTINENT PAST HISTORY, HOSPITAL COURSE, DISCHARGE INSTRUCTIONS HOME MEDS, PATIENT INSTRUCTIONS   Last Updated: 28-Sep-15 21:40 by Vaughan Basta (MD)

## 2014-07-22 NOTE — H&P (Signed)
PATIENT NAME:  Julie Foley, Amilliana L MR#:  [id number] DATE OF BIRTH:  [birth year]  DATE OF ADMISSION:  12/23/2013  REFERRING PHYSICIAN: Dr. Chiquita LothSung Jade.   PRIMARY CARE PHYSICIAN: [facility].   CHIEF COMPLAINT: Shortness of breath, cough.   HISTORY OF PRESENT ILLNESS: Ms. Julie Foley is a 59 year old African American female with past medical history of COPD on 4 liters nasal cannula at baseline, the patient has history of cocaine abuse, hypertension and as of last admission and discharge in September she was still smoking, but currently she denies any further smoking at this point, but the patient presents with complaints of shortness of breath, cough productive sputum, white in color, shortness of breath in the ED requiring BiPAP. The patient reports she was out of oxygen. Upon further questioning, she does have her concentrator which works well at home, but she was out of her portable oxygen though. The patient was afebrile.  Chest x-ray does not show any acute findings. She had decreased air entry upon presentation with significant wheezing, so she was started on steroids and nebulizer treatment and given her severe shortness of breath and requirements, hospitalists were requested to admit the patient. She denies any chest pain or any recent cocaine abuse.   PAST MEDICAL HISTORY:  1. COPD.  2. Chronic respiratory failure, on 4 liters nasal cannula. 3. Gastroesophageal reflux disease.  4. Hypertension.   PAST SURGICAL HISTORY: None.   ALLERGIES: SYMBICORT AND BUDESONIDE.   HOME MEDICATIONS:  1. Enalapril 20 mg oral b.i.d.  2. Advair 500/50 one puff b.i.d.  3. Xanax 0.5 mg oral every 8 hours as needed.  4. Norvasc 5 mg oral daily.  5. Daliresp 500 mg oral daily.  6. DuoNebs 3 mL 4 times a day as needed for wheezing.  8. Flonase 1 puff daily.  9. Hydrochlorothiazide 25 mg daily.  11. Singulair 10 mg daily.  12. Proventil as needed.  13. Qvar 80 mcg inhalational puffs b.i.d.   SOCIAL HISTORY: The patient  lives at home. Currently she denies any smoking. She denies  using any cocaine recently. Denies any alcohol use.   FAMILY HISTORY: Denies any family history of coronary artery disease at young age.   REVIEW OF SYSTEMS:  CONSTITUTIONAL: The patient denies any fever, any chills. Reports generalized weakness and fatigue.  EYES: Denies blurry vision, double vision, inflammation.  EARS, NOSE AND THROAT: Denies tinnitus, ear pain, hearing loss, epistaxis.  RESPIRATORY: Reports cough productive of sputum, white in color, reports shortness of breath and history of COPD on 4 liters.   CARDIOVASCULAR: Denies chest pain, edema, palpitation, syncope.  GASTROINTESTINAL: Denies nausea, vomiting, diarrhea, abdominal pain or hematemesis.  GENITOURINARY: Denies dysuria, hematuria or renal colic.  ENDOCRINE: Denies polyuria, polydipsia or heat or cold intolerance.  HEMATOLOGY: Denies anemia, easy bruising, bleeding diathesis.  INTEGUMENTARY: Denies acne, rash, or skin.  MUSCULOSKELETAL: Denies any swelling, gout, redness, cramps. NEUROLOGIC: Denies CVA, TIA, tremors, vertigo, ataxia.  PSYCHIATRIC: Denies anxiety, insomnia, or depression.   PHYSICAL EXAMINATION:  VITAL SIGNS: Temperature 97.8, pulse 138, respiratory rate 32, blood pressure 111/70, saturating 100% on BiPAP.  GENERAL: A frail, elderly female in mild respiratory distress on BiPAP.  HEENT: Head atraumatic, normocephalic. Pupils equal, reactive to light. Pink conjunctivae. Sclerae anicteric. Dry oral mucosa. No oral thrush. Wearing BiPAP mask. No nasal charge.  NECK: Supple. No thyromegaly. No JVD. No nuchal rigidity. Trachea is midline.   CHEST: Has significantly decreased air entry bilaterally with end expiratory wheezing, and mild use of accessory muscle.  CARDIOVASCULAR: S1, S2 heard. No rubs, murmur or gallops. Tachycardic.  ABDOMEN: Soft, nontender, nondistended. Bowel sounds present.  EXTREMITIES: No edema. No clubbing. No cyanosis. Pedal  pulses +2 bilaterally.  PSYCHIATRIC: Appropriate affect. Awake, alert x 3. Intact judgment and insight.  NEUROLOGIC: Cranial nerves grossly intact with motor strength 5/5.  No focal deficits. Sensation intact to light touch.  MUSCULOSKELETAL: No joint effusion or erythema.  SKIN: Dry. Red skin turgor.   PERTINENT LABORATORY DATA: Glucose 104, BUN 10, creatinine 0.76, sodium 143, potassium 4.0, chloride 106, CO2 of 33, troponin less than 0.02. White blood cell 5.5, hemoglobin 12, hemoglobin 38, platelets 288,000. Imaging: Chest x-ray showing no active disease. No change from previous study.   ASSESSMENT AND PLAN:  1. Chronic obstructive pulmonary disease exacerbation, the patient presents with significant wheezing and decrease air entry. We will continue her currently on BiPAP. We will start her on IV Solu-Medrol 125 mg every 6 hours given her significant wheezing and given her productive sputum, we will start her on IV levofloxacin as well. We will continue her home medication including Advair, Singulair, Daliresp and Qvar and we will start her on PPI given she is on large dose of steroids.  2. Chronic respiratory failure acute on chronic, as she is requiring BiPAP. We will re-evaluate in a couple hours after multiple nebulizer treatments and IV steroids takes effect to see if she can tolerate packed nasal cannula.  3. History of hypertension. Blood pressure is borderline so continue her on Enalapril only.  We will hold the rest and if her blood pressure resumed to increase we will add her Norvasc.  4. Gastroesophageal reflux disease. Continue with PPI.  5. Deep vein thrombosis prophylaxis. Subcutaneous heparin.   CODE STATUS: Full code.   TOTAL TIME SPENT ON ADMISSION AND PATIENT CARE: 55 minutes.    ____________________________ Starleen Arms, MD dse:JT D: 12/23/2013 02:19:03 ET T: 12/23/2013 03:21:34 ET JOB#: [id number]  cc: Starleen Arms, MD, <Dictator> Mailey Landstrom Teena Irani  MD ELECTRONICALLY SIGNED 01/07/2014 23:03

## 2014-07-22 NOTE — H&P (Signed)
PATIENT NAME:  Julie Foley, Julie Foley MR#:  [id number] DATE OF BIRTH:  04/20/1955  DATE OF ADMISSION:  11/11/2013   REFERRING PHYSICIAN:  Dorothea Glassman, MD  PRIMARY CARE DOCTOR:  Nonlocal.   ADMITTING DIAGNOSIS:  Acute on chronic respiratory failure with hypoxemia.   HISTORY OF PRESENT ILLNESS: This is a 59 year old African American female who presents in respiratory failure. The patient states that she progressively became more short of breath since last night. She attributes this to some mold that may be in her ceiling tiles, secondary to a leak in her roof. En route, the patient received 2 DuoNebs by EMS personnel.  In the Emergency Department she was found to have significantly decreased air movement and was given 3 more DuoNeb treatments, as well as magnesium and Solu-Medrol. This dramatically improved the patient's air movement and she was able to speak in complete sentences. Due to her respiratory distress and oxygen requirement, the hospitalist staff was consulted for admission.   REVIEW OF SYSTEMS:  CONSTITUTIONAL: The patient denies fever or weakness.  EYES: The patient denies double vision or inflammation.  ENT: The patient denies nosebleed or sore throat.  RESPIRATORY: The patient admits to shortness of breath and coughing. She always has some dyspnea on exertion.  CARDIOVASCULAR: Denies chest pain or orthopnea.  GASTROINTESTINAL: The patient denies nausea, vomiting or diarrhea.  GENITOURINARY: The patient denies dysuria or increased hesitancy or frequency.  ENDOCRINE: The patient denies polyuria or temperature intolerance.  HEMATOLOGIC AND LYMPHATIC: The patient denies easy bruising or bleeding.  INTEGUMENT: The patient denies rashes or lesions.  MUSCULOSKELETAL: The patient denies myalgias. The patient does complain of chronic right shoulder pain.  NEUROLOGIC: The patient denies numbness or weakness.  PSYCHIATRIC: The patient denies suicidal ideation or homicidal ideation.   PAST  MEDICAL HISTORY: Asthma, emphysema, and CHF.   PAST SURGICAL HISTORY: Tubal ligation   FAMILY HISTORY: Her mother died of some form of cancer.   SOCIAL HISTORY: The patient quit smoking. She does not drink or do any drugs. She lives alone.   MEDICATIONS:  1.  Qvar 80 mcg 1 puff b.i.d.  2.  Proventil high flow inhaler 90 mcg, 2 puffs inhaled 4 times a day as needed for coughing, wheezing, or shortness of breath.  3.  Montelukast 10 mg tablet, 1 tablet p.o. daily.  4.  Loratadine 10 mg, 1 tablet p.o. daily.  5.  Hydrochlorothiazide 25 mg, 1 tablet p.o. daily.  6.  Fluticasone 50 mcg nasal spray, 1 spray to each nostril daily.  7.  Famotidine 20 mg, 1 tablet p.o. b.i.d.  8.  Enalapril 20 mg, 1 tablet p.o. b.i.d.  9.  DuoNebs 2.5 mg/0.5 mg per 3 mL inhaled 4 times a day as needed.  10.  Daliresp 500 mcg tablet, 1 tablet p.o. daily.  11.  Azithromycin 250 mg 1 tablet p.o. daily.  12.  Amlodipine 5 mg, 1 tablet p.o. daily.  13.  Advair Diskus 500 mcg/50 mcg inhaled powder, 1 puff b.i.d.   ALLERGIES: BUDESONIDE FORMOTEROL (IT MAKES HER TONGUE SWELL) AND SYMBICORT.   PERTINENT LABORATORY RESULTS AND RADIOGRAPHIC FINDINGS: Chest x-ray shows no evidence of acute disease. ABG shows a pH of 7.27 with pCO2 of 56, pO2 of 88 on 32% FiO2; bicarbonate is 25.7. Troponin is negative. Hematocrit 42.8 and white blood cell count 7.3.   PHYSICAL EXAMINATION:  VITAL SIGNS: Temperature 97.5, pulse 115, respirations 20, blood pressure 134/84, pulse oximetry 97% on 3 L of oxygen via nasal cannula.  GENERAL: The patient is alert and oriented x 3. She is in no apparent distress at this time.  HEENT: Normocephalic, atraumatic. Pupils equal, round , and reactive to light and accommodation. Extraocular movements intact. Moist mucous membranes. Oropharynx without erythema or exudate.  NECK: Trachea is midline. No adenopathy.  CHEST: Symmetric and atraumatic.  CARDIOVASCULAR: Tachycardic. Normal S1 and S2. No rubs,  clicks, or murmurs.  LUNGS: Expiratory wheezes throughout lung fields. Decent air movement, with some use of subcostal muscles showing retraction.  ABDOMEN: Positive bowel sounds, soft, nontender. No hepatosplenomegaly. There is no distention.  GENITOURINARY: Deferred.  MUSCULOSKELETAL: Has 5/5 strength in upper and lower extremities bilaterally.  SKIN: No rashes or lesions.  EXTREMITIES: No clubbing, cyanosis, or edema.  NEUROLOGIC: Cranial nerves II-XII are grossly intact.  PSYCHIATRIC: Mood is normal. Affect is congruent.   ASSESSMENT AND PLAN: This is a 59 year old African American female with acute on chronic respiratory failure.  1.    Respiratory failure with hypoxemia. The patient clearly has an exacerbation of her chronic obstructive pulmonary disease. She is dramatically improving. I will schedule breathing treatments every 4 hours. We will wean oxygen as tolerated. Initial ABG showed hypercarbia, likely her baseline. She is still slightly acidotic, with a normal bicarbonate indicating compensation. If she needs BiPAP later through the night, we will do that, but for the time we will allow her to relax with oxygen via nasal cannula.  2.  Chronic obstructive pulmonary disease. Continue on Advair, albuterol with ipratropium, as well Daliresp and her azithromycin. There is no evidence the patient has pneumonia. We will continue oral steroids as well.  3.  Congestive heart failure. The patient states that she will swell sometimes on her ankles, likely indicating the nature of her congestive heart failure is systolic; however, there is no echocardiogram on file at this time. She is stable and will not require any diuresis.  4.  Deep vein thrombosis prophylaxis. We will do SCDs at this time.  5.  Gastrointestinal prophylaxis. Continue her proton pump inhibitor.   CODE STATUS: The patient is a Full Code.   TIME SPENT ON ADMISSION ORDERS AND PATIENT CARE: Approximately 35 minutes.     ____________________________ Kelton PillarMichael S. Sheryle Hailiamond, MD msd:MT D: 11/11/2013 04:38:57 ET T: 11/11/2013 05:27:03 ET JOB#: [id number]  cc: Kelton PillarMichael S. Sheryle Hailiamond, MD, <Dictator> Kelton PillarMICHAEL S Neziah Vogelgesang MD ELECTRONICALLY SIGNED 11/12/2013 2:55

## 2014-07-22 NOTE — Discharge Summary (Signed)
PATIENT NAME:  Julie Foley, Julie Foley MR#:  [id number] DATE OF BIRTH:  [birth year]  DATE OF ADMISSION:  11/11/2013 DATE OF DISCHARGE:  11/11/2013  PRIMARY CARE PHYSICIAN:  None local.   CHIEF COMPLAINT AT THE TIME OF ADMISSION: Shortness of breath.   ADMITTING DIAGNOSES:  1.  Acute on chronic respiratory failure with hypoxemia. 2.  Chronic obstructive pulmonary disease exacerbation.   PRIMARY DISCHARGE DIAGNOSES:  1.  Acute respiratory failure with hypoxia and chronic respiratory failure include the patient usually uses 4 liters of oxygen as needed.  She was ambulating in the holiday on room air and saturating 100%.  2.  Acute chronic obstructive pulmonary disease exacerbation, mild, improved.   SECONDARY DISCHARGE DIAGNOSIS: Chronic history of congestive heart failure.   PROCEDURES: None.   CONSULTATIONS: None.   BRIEF HISTORY AND PHYSICAL AND HOSPITAL COURSE: The patient is a 59 year old African-American female who has chronic history of respiratory failure and lives on 4 liters of oxygen as needed to take on as needed basis. The patient came into the ED with a chief complaint of progressively worsening of shortness of breath. Please review history and physical for details. The patient was given nebulizer treatments with no significant improvement. She was admitted to the hospital with a chief complaint of acute hypoxic respiratory failure on chronic respiratory failure. She was treated for chronic obstructive pulmonary disease exacerbation with Solu-Medrol nebulizer treatments and she was given azithromycin antibiotics. The patient's clinical condition significantly improved within no time. The patient started ambulating in the hallway without any oxygen and her pulse oximetry was at 100% by the afternoon. She has requested to discharge her home with some anxiolytic medications and steroids. As condition is stable, she was discharged home.  SIGNIFICANT LABS AND IMAGING STUDIES: CBC is normal. BMP is  normal. Troponin less than 0.2. Chest x-ray, portable, no acute cardiopulmonary disease.  MEDICATIONS AT THE TIME OF DISCHARGE: Loratadine 10 mg p.o. once daily, amlodipine 5 mg once daily, hydrochlorothiazide 25 mg once daily, famotidine 20 mg 1 tablet p.o. 2 times a day, Qvar 80 mcg 1 puff inhalation 2 times a day, Daliresp 500 mcg 1 tablet p.o. once daily, Proventil 2 puffs inhalation every 4 hours as needed for shortness of breath, fluticasone 1 spray nasally once a day, enalapril  20 mg 2 times a day, montelukast 10 mg once daily, Advair 500/50 one puff inhalation 2 times a day, DuoNeb every 4 hours as needed, azithromycin 250 mg p.o. once daily for 5 days, prednisone 10 mg p.o. once daily for 5 days, alprazolam 0.5 mg 1 tablet 1- 2 times a day as needed for anxiety with no refills.   PLAN: To continue home oxygen, 4 liters as needed for shortness of breath.   ACTIVITY: As tolerated.   DIET: Low-fat, low-cholesterol.  DISCHARGE FOLLOW UP: Follow-up with primary care physician in 1-2 weeks. PCP to consider getting an echocardiogram as an outpatient. The patient is to follow up with congestive heart failure clinic as an outpatient.   Plan of care was discussed in detail with the patient. She verbalized understanding of the plan. She was counseled to quit smoking.  TOTAL TIME SPENT ON THE DISCHARGE: 40 minutes.   ____________________________ Ramonita LabAruna Melburn Treiber, MD ag:TT D: 11/12/2013 12:57:00 ET T: 11/12/2013 16:44:05 ET JOB#: [id number]  cc: Ramonita LabAruna Traeger Sultana, MD, <Dictator> Ramonita LabARUNA Pinchas Reither MD ELECTRONICALLY SIGNED 11/17/2013 15:57

## 2014-07-22 NOTE — Discharge Summary (Signed)
PATIENT NAME:  Julie Foley, [name] MR#:  [id number] DATE OF BIRTH:  October 24, 1955  DATE OF ADMISSION:  11/29/2013 DATE OF DISCHARGE:  12/01/2013  DISCHARGE DIAGNOSES:  1.  Acute on chronic respiratory failure secondary to chronic obstructive pulmonary disease exacerbation on chronic oxygen at home on 4 liters.  2.  Polysubstance abuse with cocaine.  3.  Hypertension.  4.  Gastroesophageal reflux disease.  5.  Anxiety.   6. possible vegetation identified on ECHO cardiogram which will need to be followed with a TEE in the outpatient setting.  CONSULTATIONS:  Palliative care, Dr. Harriett SineNancy Phifer.   PROCEDURES: Chest x-ray September 1, shows no active cardiopulmonary disease.  A 2-D echocardiogram, September 2 shows left ventricular ejection fraction 55% to 60%, normal global left ventricular systolic function, mild tricuspid regurgitation, possible mobile mass  which could be a vegetation. TEE is recommended.   BRIEF HISTORY OF PRESENT ILLNESS:  This 59 year old African American female with past medical history of COPD with chronic respiratory failure, on 4 liters at home, ongoing smoking and cocaine use, hypertension, who was in the hospital from August 29 to August 31, was discharged and returned the next day with acute onset respiratory failure.  The patient, on BiPAP in the Emergency Room.  Urine toxicology presents positive for cocaine.  She is admitted to the Critical Care Unit.   HOSPITAL COURSE AND TREATMENT:  1.  Acute on chronic respiratory failure secondary to chronic obstructive pulmonary disease:  The patient was admitted 1 day after discharge with recurrence likely due to use of crack cocaine.  She was admitted to Critical Care Unit and was briefly on BiPAP.  She was able to wean down to 4 liters, which is her chronic home level of oxygen.  She is discharged as before on prednisone, azithromycin, Advair, nebulizers, montelukast.  She feels that her symptoms are triggered by mold in her apartment  and she states that she will not be going back to this apartment.  2.  Polysubstance abuse with crack cocaine:  Likely exacerbating chronic obstructive pulmonary disease. She was discharged less than 1 day and returns with positive urine drug screen.  Social work consultation was obtained prior to discharge and she was given local treatment options.  3.  Hypertension: Continue Norvasc and enalapril.  4.  Gastroesophageal reflux disease, continue Pepcid.  5.  Anxiety: Continue Xanax.    DISPOSITION:  The patient is DNR.  CONDITION ON DISCHARGE:  VITAL SIGNS: Temperature 96.6, pulse 92, respirations 22, blood pressure 102/64, oxygen saturation 95% on 4 liters.  GENERAL: The patient is alert, oriented, in no distress.  RESPIRATORY: There is diffuse wheezing, good air movement, no respiratory distress. The patient is conversing comfortably.  CARDIOVASCULAR: Regular rate and rhythm, no murmurs, rubs, or gallops, no edema, pulses are 1+.  ABDOMEN: Soft, nontender, nondistended. Bowel sounds are normal.  NEUROLOGIC: Grossly nonfocal neurologic examination. PSYCHIATRIC: The patient is anxious.  She is demanding to leave at this time.   DISCHARGE MEDICATIONS:  1.  Loratadine 10 mg 1 tablet once a day.  2.  Proventil HFA CFC free 98 mcg per inhalations 2 puffs inhaled 4 times a day as needed for shortness of breath.  3.  Prednisone taper 60 mg once a day x 2 days, decreasing by 10 mg every 2 days.  4.  Enalapril 20 mg 1 tablet twice a day.  5.  Alprazolam 0.5 mg 1 tablet 1 to 2 times a day as needed for anxiety.  6.  Qvar 80 mcg per inhalations 1 puff 2 times a day.  7.  Daliresp 500 mg 1 tablet once a day.  8.  Fluticasone nasal spray 50 mcg per inhalations 1 spray each nostril once a day.  9.  Azithromycin 250 mg 1 tablet orally once a day x 3.  10. Montelukast 10 mg 1 tablet once a day in the evening.  11. Famotidine 20 mg 1 tablet 2 times a day.  12. Hydrochlorothiazide 25 mg 1 tablet once a  day.  13. Amlodipine 5 mg 1 tablet once a day.  14. Advair Diskus 500 mcg/50 mcg 1 puff twice a day.  15. DuoNeb 2.5 mg/0.5 mg/3 mL inhalation, 3 mL inhaled 4 times a day as needed.   DISCHARGE INSTRUCTIONS: Follow up within 1 to 2 weeks with [facility].   Please follow up within 1 week with pulmonology.   DIET: Heart healthy diet.   ACTIVITY: No restrictions.   TIME SPENT ON DISCHARGE:  45 minutes.     ____________________________ Ena Dawley. Clent Ridges, MD cpw:DT D: 12/02/2013 16:46:57 ET T: 12/02/2013 17:59:20 ET JOB#: [id number]  cc: Santina Evans P. Clent Ridges, MD, <Dictator> Gale Journey MD ELECTRONICALLY SIGNED 12/03/2013 15:02

## 2014-07-22 NOTE — H&P (Signed)
PATIENT NAME:  Julie Foley, [name] MR#:  [id number] DATE OF BIRTH:  05-Nov-1955  DATE OF ADMISSION:  06/28/2013  REFERRING PHYSICIAN:  Dr. Fanny BienQuale.  [facility]   CHIEF COMPLAINT:  Shortness of breath.   HISTORY OF PRESENT ILLNESS:  This is a 59 year old African American female with past medical history of COPD on 4 liters nasal cannula at baseline as well as hypertension is presenting with shortness of breath.  She describes one day duration of shortness of breath, acute onset.  This occurred while she was outside at her apartment complex while they were mowing grass.  She noted then having wheeze with her shortness of breath as well as cough which is nonproductive.  Denies any fevers, chills, chest pain, palpitations, lower extremity edema, or recent sick contacts, presented to the hospital for further work-up and evaluation.  Noted to be in respiratory distress requiring DuoNeb treatments.  Her symptoms have somewhat improved since arrival.   REVIEW OF SYSTEMS:  CONSTITUTIONAL:  Denies fever, fatigue, weakness.  EYES:  Denies blurred vision, double vision, eye pain.  EARS, NOSE, THROAT:  Denies tinnitus, ear pain, hearing loss.  RESPIRATORY:  Positive for cough, wheeze, shortness of breath.  CARDIOVASCULAR:  Denies chest pain, palpitations, edema.  GASTROINTESTINAL:  Denies nausea, vomiting, diarrhea, vomiting or abdominal pain.  GENITOURINARY:  Denies dysuria, hematuria.  ENDOCRINE:  Denies nocturia or thyroid problems.   HEMATOLOGY AND LYMPHATIC:  Denies easy bruising, bleeding.  SKIN:  Denies rashes or lesions.  MUSCULOSKELETAL:  Denies pain in neck, back, shoulder, knees, hips or arthritic symptoms.  NEUROLOGIC:  Denies paralysis, paresthesias.  PSYCHIATRIC:  Denies anxiety or depressive symptoms.  Otherwise full review of systems performed by me is negative.   PAST MEDICAL HISTORY:  Gastroesophageal reflux disease, asthma, COPD on 4 liters nasal cannula, hypertension.    FAMILY HISTORY:  Positive for asthma as well as brain cancer.   SOCIAL HISTORY:  Every day tobacco use.  Denies any alcohol or drug usage.   ALLERGIES:  BUDESONIDE, FORMOTEROL.   HOME MEDICATIONS:  Include enalapril 20 mg by mouth twice daily, loratadine 10 mg by mouth daily, alprazolam 0.5 mg daily as needed anxiety, albuterol 0.083% solution 3 mL every four hours as needed for shortness of breath, Proventil 90 mcg inhalation 2 puffs 4 times daily as needed for shortness, Spiriva 18 mcg inhalation once daily, Norvasc 5 mg by mouth daily, hydrochlorothiazide 25 mg by mouth daily, famotidine 20 mg by mouth twice daily, Flonase 50 mcg inhalation nasal spray daily, QVAR 80 mcg inhalation 1 puff twice daily, Daliresp 500 mcg by mouth daily.   PHYSICAL EXAMINATION: VITAL SIGNS:  Temperature 97.8, heart rate 125 on arrival, currently 95, respirations 28, blood pressure 127/75, saturating 100% on supplemental O2, 4 liters nasal cannula.  Weight 70.8 kg, BMI 28.6.  GENERAL:  Obese African American female in minimal distress secondary to respiratory status.  HEAD:  Normocephalic, atraumatic.  EYES:  Pupils equal, round, reactive to light.  Extraocular muscles intact.  No scleral icterus.  MOUTH:  Moist mucous membranes.  Dentition intact.  No abscess noted.  EARS, NOSE, THROAT:  Throat clear without exudates.  No external lesions. NECK:  Supple.  No thyromegaly.  No nodules.  No JVD.  PULMONARY:  Prolonged expiratory phase with diffuse wheezing in all lung fields.  Decreased breath sounds throughout all lung.  She is tachypneic with good respiratory effort.  Chest nontender to palpation.  CARDIOVASCULAR:  S1, S2, tachycardic.  No murmurs, rubs or gallops.  No edema.  Pedal pulses 2+ bilaterally. GASTROINTESTINAL:  Soft, nontender, nondistended.  No masses.  Positive bowel sounds.  No hepatosplenomegaly.  MUSCULOSKELETAL:  No swelling, clubbing or edema.  Range of motion full in all extremities.   NEUROLOGIC:  Cranial nerves II through XII intact.  No gross focal neurological deficits.  Sensation intact.  Reflexes intact.  SKIN:  No ulcerations, lesions, rash, cyanosis.  Skin warm, dry.  Turgor intact.  PSYCHIATRIC:  Mood and affect within normal limits.  The patient is awake, alert and oriented x 3.  Insight and judgment intact.   LABORATORY DATA:  Sodium 139, potassium 4, chloride 102, bicarb 32, BUN 7, creatinine 0.8, glucose 149.  LFTs within normal limits.  WBC 5.7, hemoglobin 13.4, platelets 317.  Chest x-ray performed revealing no acute cardiopulmonary process.   ASSESSMENT AND PLAN:  A 59 year old African American female with history of chronic obstructive pulmonary disease on 4 liters nasal cannula presenting with shortness of breath.  1.  Chronic obstructive pulmonary disease exacerbation:  Supplemental O2 to keep oxygen saturation greater than 88%.  DuoNeb therapy q. 4 hours, Solu-Medrol IV 60 mg daily, incentive spirometry as well as Spiriva.  There is no indication for antibiotics at this time given no sputum production or infiltrate on chest x-ray.  2.  Hypertension.  Continue with Norvasc, hydrochlorothiazide.  3.  Gastroesophageal reflux disease.  Continue with Pepcid.  4.  Venous thromboembolism prophylaxis with heparin subQ. 5.  CODE STATUS:  THE PATIENT IS A FULL CODE.   TIME SPENT:  45 minutes.    ____________________________ Julie Athens. Hower, MD dkh:ea D: 06/28/2013 22:30:00 ET T: 06/28/2013 23:14:54 ET JOB#: [id number]  cc: Julie Athens. Hower, MD, <Dictator> DAVID Synetta Shadow MD ELECTRONICALLY SIGNED 06/29/2013 1:07

## 2014-07-22 NOTE — Discharge Summary (Signed)
PATIENT NAME:  Julie Foley, Julie Foley MR#:  [id number] DATE OF BIRTH:  1956/01/10  DATE OF ADMISSION:  06/03/2013 DATE OF DISCHARGE:  06/04/2013  PRIMARY CARE PHYSICIAN:  Will be her doctor on our Medicaid card.  FINAL DIAGNOSES: 1.  Acute respiratory failure, which resolved.  2.  Chronic obstructive pulmonary disease exacerbation.  3.  Anxiety.  4.  Gastroesophageal reflux disease.  5.  Hypertension.   MEDICATIONS ON DISCHARGE:  Include loratadine 10 mg daily, amlodipine 5 mg daily, hydrochlorothiazide 25 mg daily, famotidine 20 mg twice a day, Spiriva 1 inhalation daily, QVAR 80 mcg/inhalation 1 puff twice a day, Daliresp 500 mcg orally daily, Proventil HFA 2 puffs 4 times a day as needed for shortness of breath, Flonase nasal spray one spray each nostril daily, enalapril 20 mg twice a day, albuterol nebulizer 3 mL every four hours, Xanax 0.5 mg once a day as needed anxiety, prednisone 5mg  5 tablets day one, 4 tablets day two, 3 tablets day three and four, 2 tablets day five and six, 1 tablet day seven and eight, then stop.  Levaquin 750 mg every 24 hours for five more days.   DIET:  Low sodium diet, regular consistency.   ACTIVITY:  As tolerated.    FOLLOWUP:  Can follow up with Dr. Freda MunroSaadat Khan, pulmonary as outpatient.  Follow up with your doctor on your Medicaid card.   HOSPITAL COURSE:  The patient was admitted 06/03/2013 and discharged 06/04/2013.  Initially came in with shortness of breath requiring BiPAP in order to oxygenate.  She was admitted with acute respiratory failure, COPD exacerbation, started on high-dose Solu-Medrol.  By the next day her lungs were clear.  She was asking to go home.  Laboratory and radiological data during the hospital course included an EKG, sinus tachycardia.  Magnesium 1.9.  Troponin negative.  Glucose 141, BUN 14, creatinine 0.91, sodium 141, potassium 3.4, chloride 106, CO2 28, calcium 9.2.  Liver function tests normal range.  White blood cell count 10.8,  hemoglobin and hematocrit 13.7 and 40.3, platelet count of 324.  ABG showed a pH of 7.31, pCO2 of 55, pO2 91, O2 saturation 30%, bicarb 27.7 and that was on BiPAP.  Chest x-ray showed no acute abnormality.  White count upon discharge 8.2, creatinine 0.86.   HOSPITAL COURSE PER PROBLEM LIST:  1.  For the patient's acute respiratory failure, pulse ox on room air was acceptable.  No oxygen needed.  2.  COPD exacerbation.  The patient was started on high-dose Solu-Medrol, switched over to prednisone and a quick prednisone taper.  The patient was on Levaquin from admission.  Currently receiving an IV dose, on the 7th, will only need five more days of Levaquin.  The patient has her inhalers and nebulizer at home.  3.  Anxiety.  The patient asking for Xanax.  I did prescribe a small script of that.  4.  Gastroesophageal reflux disease.  The patient is on famotidine.  5.  Hypertension.  Usual medications were continued.   The patient's blood pressure upon discharge was 117/78.  Time spent on discharge 35 minutes.    ____________________________ Herschell Dimesichard J. Renae GlossWieting, MD rjw:ea D: 06/04/2013 15:54:12 ET T: 06/05/2013 05:49:46 ET JOB#: [id number]  cc: Herschell Dimesichard J. Renae GlossWieting, MD, <Dictator> Salley ScarletICHARD J Maejor Erven MD ELECTRONICALLY SIGNED 06/09/2013 16:20

## 2014-07-22 NOTE — Discharge Summary (Signed)
PATIENT NAME:  Julie Foley, Julie Foley MR#:  [id number] DATE OF BIRTH:  1955-11-10  DATE OF ADMISSION:  08/31/2013 DATE OF DISCHARGE:  09/05/2013   For a detailed note, please take a look at the history and physical done on admission.   DIAGNOSES AT DISCHARGE: As follows: Acute on chronic respiratory failure secondary to chronic obstructive pulmonary disease exacerbation. Chronic obstructive pulmonary disease exacerbation. Anxiety. Chronic shoulder pain. Hypertension.   DIET: The patient is being discharged on a low-sodium diet.   ACTIVITY: As tolerated.   FOLLOWUP: With Dr. Freda Foley in the next 1 to 2 weeks.   DISCHARGE MEDICATIONS: Loratadine 10 mg daily, amlodipine 5 mg daily, HCTZ 25 mg daily, famotidine 20 mg b.i.d., Spiriva 1 puff daily, QVAR 80 mcg 1 puff b.i.d., Daliresp 500 mcg 1 tab daily, albuterol inhaler 2 puffs 4 times daily as needed, Flonase 1 spray to each nostril daily, enalapril 20 mg b.i.d., Singulair 10 mg daily, Advair 500/50 one puff b.i.d., DuoNebs 4 times daily as needed, Xanax 0.25 mg q.8 hours as needed, Tylenol with hydrocodone 5/325 one tab q.6 hours as needed, Levaquin 500 mg daily x 5 days, and prednisone taper starting at 60 mg down to 10 mg over the next 12 days. The patient is also being discharged on 4 liters oxygen continuously.   CONSULTANTS DURING THE HOSPITAL COURSE: Dr. Freda Foley from pulmonary.   PERTINENT STUDIES DONE DURING THE HOSPITAL COURSE: As follows: Chest x-ray done on admission showing no evidence of cardiopulmonary disease.   HOSPITAL COURSE: This is a 59 year old female who presented to the hospital with shortness of breath and cough and noted to be in acute on chronic hypercapnic respiratory failure secondary to COPD.  1.  Acute on chronic hypoxic hypercarbic respiratory failure: This was secondary to the patient's COPD exacerbation. The patient was initially admitted to the Intensive Care Unit, started on BiPAP. Also started on aggressive  treatment for his COPD with IV steroids, around-the-clock nebulizer treatments. Also started on Advair, Spiriva, and also empiric antibiotics. Her chest x-ray on admission did not show any evidence of pneumonia. After aggressive therapy and BiPAP at nights, the patient's clinical symptoms have significantly improved. She is less bronchospastic and wheezing, therefore, currently being discharged on a prednisone taper along with empiric antibiotics and maintenance of her inhalers as stated.  2.  COPD exacerbation: This was likely the cause of the patient's shortness of breath and cough. This was secondary to ongoing tobacco abuse. The patient's chest x-ray was negative for pneumonia. The patient received aggressive therapy with IV steroids, around-the-clock nebulizer treatments, maintenance of her maintenance inhalers, and empiric antibiotics. She was also seen by pulmonary by Dr. Welton Foley, who agreed with this management. Since the patient has clinically improved significantly since admission, she is being discharged on an oral prednisone taper along with antibiotics and maintenance of her inhalers. I think she would also likely benefit from BiPAP at bedtime, but this is also to be arranged by her pulmonologist as an outpatient. I did make her a referral to Dr. Welton Foley, as she sees most of her doctors at [facility].  3.  Hypertension: The patient remained hemodynamically stable on her Norvasc and enalapril. She will resume that.  4.  GERD: The patient was maintained on Protonix. She will resume that.  5.  Anxiety: A lot of this was propagated by her COPD. I did discharge her on low-dose Xanax.  6.  Chronic shoulder pain: She likely needs to see a pain specialist. I  did give her a few tabs of Norco as needed for pain.   The patient is a full code.   She is being discharged home.   TIME SPENT ON DISCHARGE: 40 minutes.   ____________________________ Rolly PancakeVivek J. Cherlynn KaiserSainani, MD vjs:jcm D: 09/05/2013 14:36:33 ET T: 09/05/2013  14:52:27 ET JOB#: [id number]  cc: Rolly PancakeVivek J. Cherlynn KaiserSainani, MD, <Dictator> Yevonne PaxSaadat A. Khan, MD Houston SirenVIVEK J Blaise Grieshaber MD ELECTRONICALLY SIGNED 09/27/2013 20:25

## 2014-07-22 NOTE — Discharge Summary (Signed)
PATIENT NAME:  Julie Foley, Julie Foley MR#:  [id number] DATE OF BIRTH:  June 10, 1955  DATE OF ADMISSION:  11/26/2013 DATE OF DISCHARGE:  11/28/2013  ADMITTING PHYSICIAN: Angelica Ranavid Hower, MD  DISCHARGING PHYSICIAN: Julie Baasadhika Paula Busenbark, MD  [facility]  CONSULTATIONS IN THE HOSPITAL: None.   DISCHARGE DIAGNOSES:  1.  Acute respiratory failure secondary to acute on chronic obstructive pulmonary disease exacerbation.  2.  Chronic respiratory failure, on 4 liters home oxygen.  3.  Hypertension.  4.  Anxiety.  5.  Gastroesophageal reflux disease.   DISCHARGE HOME MEDICATIONS:  1.  Loratadine 10 mg p.o. daily.  2.  Proventil inhaler 2 puffs 4 times a day as needed for shortness of breath.  3.  Enalapril 20 mg p.o. b.i.d.  4.  Xanax 0.5 mg p.o. 1-2 times a day p.r.n. for anxiety.  5.  Daliresp 500 mcg p.o. daily.  6.  QVAR 80 mcg inhalation aerosol 1 puff twice a day.  7.  Flonase 50 mcg nasal spray, 1 spray each nostril once a day.  8.  Singulair 10 mg p.o. daily.  9.  Famotidine 20 mg p.o. b.i.d.  10.  Hydrochlorothiazide 25 mg p.o. daily.  11.  Norvasc 5 mg p.o. daily.  12.  Advair 500/50 one puff b.i.d.  13.  Prednisone taper over 12 days.  14.  Azithromycin 250 mg p.o. daily for 3 more days.  15.  DuoNebs 3 mL 4 times a day as needed for wheezing.   DISCHARGE HOME OXYGEN: Four liters.   DISCHARGE DIET: Low-sodium diet.   DISCHARGE ACTIVITY: As tolerated.  FOLLOWUP INSTRUCTIONS: PCP followup in 2 weeks.   LABORATORIES AND IMAGING STUDIES PRIOR TO DISCHARGE:  Chest x-ray prior to discharge showing clear lung fields, no acute cardiopulmonary disease.   WBC 9.0, hemoglobin 14.1, hematocrit 44.0, platelet count 336,000.   Sodium 144, potassium 4.3, chloride 109, bicarbonate 26, BUN 11, creatinine 0.9, glucose 127 and calcium of 89.   ALT 30, AST 32, alkaline phosphatase 71, total bilirubin 0.4, albumin of 4.4. Troponins negative.   BRIEF HOSPITAL COURSE: Julie Foley is a  59 year old African American female with past medical history significant for chronic respiratory failure secondary to COPD, already on 4 liters home oxygen, hypertension, who is noncompliant with her medications and oxygen, comes to the hospital secondary to worsening shortness of breath and was noted to be in COPD exacerbation.  1.  Acute on chronic respiratory failure secondary to COPD exacerbation, required to be on BiPAP initially, was very tachypneic and hypoxic when she initially came in, improved with IV steroids, nebulizer treatments and on BiPAP. The patient after 1 day secondary to anxiety wanted to leave AMA, however, her family persuaded her to stay, and she is feeling much better today with improved wheezing and back on 4 liters O2. She is being discharged on 12-day prednisone taper, inhalers and nebulizers.  2.  Hypertension. She is on enalapril, Norvasc and hydrochlorothiazide.   Her course has been otherwise uneventful in the hospital.   DISCHARGE CONDITION: Stable.   DISCHARGE DISPOSITION: Home.   TIME SPENT ON DISCHARGE: Forty minutes.    ____________________________ Julie Baasadhika Tae Vonada, MD rk:TT D: 11/28/2013 12:09:55 ET T: 11/28/2013 14:13:22 ET JOB#: [id number]  cc: Julie Baasadhika Lillis Nuttle, MD, <Dictator> Julie BaasADHIKA Jordayn Mink MD ELECTRONICALLY SIGNED 12/01/2013 13:51

## 2014-07-22 NOTE — H&P (Signed)
PATIENT NAME:  Julie Foley, [name] MR#:  [id number] DATE OF BIRTH:  Sep 12, 1955  DATE OF ADMISSION:  05/12/2013  REFERRING PHYSICIAN: Dr. Lowella FairyJohn Woodruff.   [facility].   CHIEF COMPLAINT: Shortness of breath.   HISTORY OF PRESENT ILLNESS: A 59 year old African American female with past medical history of COPD on 4 liters nasal cannula at baseline, as well as hypertension. Presenting with shortness of breath. She describes a total of 4 days' duration of shortness of breath, mainly dyspnea on exertion with associated cough with productive sputum which is described as white, which has increased frequency from baseline cough. She denies any fevers, chills, chest pain, palpitations or recent sick contacts. Denies any lower extremity edema or orthopnea. She actually presented to [facility] 1 day prior to arrival. Found to be hypercapnic at that time and placed on BiPAP therapy for multiple hours with initial improvement. Then, she subsequently left against medical advice. She is returning today with worsening symptoms. Currently without complaints as she is on BiPAP therapy; however, she was hypoxemic on EMS arrival with O2 saturations in the 70s.   REVIEW OF SYSTEMS:   CONSTITUTIONAL: Denies fever or chills. Positive for generalized weakness.  EYES: Denies blurry vision, double vision, eye pain.  ENT: Denies tinnitus, ear pain, hearing loss.  RESPIRATORY: Positive for cough, wheeze, shortness of breath as described above.  CARDIOVASCULAR: Denies chest pain, palpitations, edema.  GASTROINTESTINAL: Denies nausea, vomiting, diarrhea, abdominal pain.  GENITOURINARY: Denies dysuria or hematuria.  ENDOCRINE: Denies nocturia or thyroid problems.  HEMATOLOGIC AND LYMPHATIC: Denies easy bruising or bleeding.  SKIN: Denies rash or lesions.  MUSCULOSKELETAL: Denies pain in the neck, back, shoulder, knees, hips or arthritic symptoms.  NEUROLOGIC: Denies  paralysis or paresthesias.  PSYCHIATRIC: Denies anxiety or depressive symptoms.   Otherwise, full review of systems performed by me is negative.   PAST MEDICAL HISTORY: Gastroesophageal reflux disease, asthma, COPD on 4 liters nasal cannula at baseline, hypertension.   FAMILY HISTORY: Positive for brain cancer as well as asthma.   SOCIAL HISTORY: Every day tobacco abuse, currently smoking only approximately 2 cigarettes; however, does have a remote history of smoking approximately 2 packs a day for greater than 35 years. Denies any alcohol or drug usage.   ALLERGIES: BUDESONIDE/FORMOTEROL causes swelling.   HOME MEDICATIONS: Enalapril 20 mg p.o. b.i.d., Nitrostat 0.4 mg sublingual tablet every 5 minutes as needed for chest pain, loratadine 10 mg p.o. daily, albuterol 2.5 mg inhalation every 4 hours as needed for shortness of breath, ipratropium 500 mcg inhalation every 4 hours as needed for shortness of breath, Proventil 90 mcg inhalation 2 puffs 4 times daily as needed for shortness of breath, Spiriva 18 mcg inhalation daily, Norvasc 5 mg p.o. daily, hydrochlorothiazide 25 mg p.o. daily, famotidine 20 mg p.o. b.i.d., montelukast 10 mg p.o. daily, azithromycin 250 mg p.o. daily, Flonase 50 mcg nasal spray once daily, Qvar 80 mcg inhalation 1 puff b.i.d., Daliresp 500 mcg p.o. daily.   PHYSICAL EXAMINATION:  VITAL SIGNS: Temperature 98.5, heart rate 148, respirations 26, blood pressure 159/86, saturating 94% on BiPAP therapy 16/6. Weight 74.8 kg, BMI 35.7.  GENERAL: Well-nourished, well-developed, African American female, currently in moderate distress secondary to respiratory status.  HEAD: Normocephalic, atraumatic.  EYES: Pupils equal, round and reactive to light. Extraocular muscles intact. No scleral icterus.  MOUTH: Moist mucosal membranes. Dentition intact. No abscess noted.  EARS, NOSE, THROAT: Throat clear without exudates. No external lesions.  NECK: Supple. No thyromegaly. No nodules.  No JVD.  PULMONARY: Diffuse coarse breath sounds bilaterally with prolonged expiratory phase and notable expiratory wheezing over right lung fields. Currently on BiPAP therapy with good respiratory effort. No retractions at this time.  CHEST: Nontender to palpation.  CARDIOVASCULAR: S1, S2, tachycardic. No murmurs, rubs or gallops. No edema. Pedal pulses 2+ bilaterally.  GASTROINTESTINAL: Soft, nontender, nondistended. No masses. Positive bowel sounds. No hepatosplenomegaly.  MUSCULOSKELETAL: No swelling, clubbing, edema. Range of motion full in all extremities.  NEUROLOGIC: Cranial nerves II through XII intact. No gross focal neurological deficits. Sensation intact. Reflexes intact.  SKIN: No ulcerations, lesions, rash, cyanosis. Skin warm, dry. Turgor intact.  PSYCHIATRIC: Mood and affect within normal limits. The patient is awake, alert, oriented x 3. Insight and judgment intact.   LABORATORY DATA: Chest x-ray performed revealing small effusion in the right lower lobe. Hyperinflated lung fields. No acute infiltration. Sodium 139, potassium 4.4, chloride 103, bicarb 34, BUN 10, creatinine 0.67, glucose 121. LFTs within normal limits. WBC 5.9, hemoglobin 14.2, platelets 296. ABG performed, 7.3/66/83/32.5.   ASSESSMENT AND PLAN: A 59 year old African American female with history of chronic obstructive pulmonary disease on 4 liters nasal cannula at baseline, presenting with shortness of breath.  1. Acute on chronic respiratory failure with hypercapnia: Continue BiPAP therapy, currently at 16/6, with recheck ABG post 1 hour of therapy. Continue to monitor her status. Wean BiPAP therapy as required. The patient is a known CO2 retainer, appears to have baseline CO2 approximately 50 based off her current ABG. Wean BiPAP therapy as tolerated. She will likely need a prolonged BiPAP wean involved q.2 hours on and off and then extending as tolerated. She received Solu-Medrol. Continue IV Solu-Medrol 60 mg daily.  Will change antibiotic coverage to Levaquin for chronic obstructive pulmonary disease exacerbation as she is on baseline azithromycin and still having persistent symptoms. Provide DuoNeb therapy q.4 hours. Add incentive spirometry when she is able and off BiPAP therapy. Continue Spiriva and other home breathing treatments.  2. Hypertension: Continue with enalapril and Norvasc.  3. Venous thromboembolism prophylaxis with heparin subcutaneous.   The patient is FULL CODE.   CRITICAL CARE TIME SPENT: 45 minutes.    ____________________________ Cletis Athens. Sunday Klos, MD dkh:gb D: 05/12/2013 21:11:49 ET T: 05/12/2013 22:19:19 ET JOB#: [id number]  cc: Cletis Athens. Raedyn Klinck, MD, <Dictator> Ivonna Kinnick Synetta Shadow MD ELECTRONICALLY SIGNED 05/13/2013 21:04

## 2014-07-22 NOTE — Consult Note (Signed)
   Comments   Josh Borders, NP, and I met with pt's 2 daughters and significant other with whom pt lives. Updated them on pt's current condition. We discussed code status with family. They all say that pt has said many times that she does not want to be intubated. Of note, pt was intubated at [facility] in the past. Though family are struggling with decision, they all want to respect pt's wishes. They are in agreement with DNR. Order entered.   Electronic Signatures: Blaine Hari, Izora Gala (MD)  (Signed 01-Sep-15 11:56)  Authored: Palliative Care   Last Updated: 01-Sep-15 11:56 by Jerad Dunlap, Izora Gala (MD)

## 2014-07-22 NOTE — H&P (Signed)
PATIENT NAME:  Julie Foley, Julie Foley MR#:  [id number] DATE OF BIRTH:  04/05/1955  DATE OF ADMISSION:  06/02/2013  [facility]   [facility]  [facility]   CHIEF COMPLAINT:  Shortness of breath.   HISTORY OF PRESENT ILLNESS:  The patient is a 59 year old African American female with past medical history of COPD, chronic respiratory failure with hypoxia, lives on 4 liters of oxygen via nasal cannula, hypertension and GERD is presenting to the ER with a chief complaint of shortness of breath.  The patient is reporting that she was in her usual state of health and then suddenly started having shortness of breath today evening.  She has been having dry cough with no phlegm.  She tried nebulizer treatments and inhalers at home with no significant improvement.  As the patient feels extremely tight in her chest with cough and shortness of breath she called EMS.  The patient being extremely short of breath she was given magnesium sulfate 2 grams IV, two DuoNeb treatments and Solu-Medrol 125 mg IV.  The patient is brought into the ER.  Initially when she came in she was tripoding and she was placed on BiPAP machine.  Eventually, the patient started feeling better.  Had FiO2 on BiPAP, was cut down to 30%.  During my examination, the patient started feeling much better.  Not using any accessory muscle, but still short of breath.  Her boyfriend is at bedside.  Denies any chest pain, fever or sick contacts.  No other complaints.  She sees pulmonologist as an outpatient and her next appointment is on April 15th.   PAST MEDICAL HISTORY:  GERD, chronic history of asthma, COPD, lives on 4 liters of oxygen via nasal cannula, hypertension.   PAST SURGICAL HISTORY:  Bilateral tubal ligation.   ALLERGIES:  SHE IS ALLERGIC TO BUDESONIDE AND FORMOTEROL.   PSYCHOSOCIAL HISTORY:  She used to smoke, but quit smoking recently.  Lives with boyfriend.  Denies alcohol or illicit drug usage.   FAMILY HISTORY:   Positive for brain cancer as well as asthma.   HOME MEDICATIONS:  Spiriva 18 mcg inhalation 1 inhalation once a day, QVAR 1 puff inhalation 2 times a day, Proventil 2 puff inhalation 4 times a day, prednisone 20 mg 2 tablets by mouth daily and on tapering dose, loratadine 10 mg by mouth once daily, hydrochlorothiazide 25 mg once daily, Flonase one spray via nasal route once daily, famotidine 20 mg 2 times a day, enalapril 20 mg by mouth twice daily, Daliresp 500 mcg 1 tablet once daily, amlodipine 5 mg once daily, alprazolam 0.5 mg 1 tablet by mouth once daily as needed for anxiety, albuterol inhalation every four hours as needed for shortness of breath.   REVIEW OF SYSTEMS: CONSTITUTIONAL:  Denies fever, fatigue.  EYES:  Denies blurry vision, double vision, glaucoma.  EARS, NOSE, THROAT:  Denies epistaxis, discharge.  RESPIRATIONS:  Complaining of dry cough, chronic history of COPD and chronic respiratory failure with hypoxia, lives on 4 liters of oxygen.  CARDIOVASCULAR:  No chest pain, palpitations.  GASTROINTESTINAL:  Denies nausea, vomiting, diarrhea.  GENITOURINARY:  No dysuria, hematuria.  GYNECOLOGIC AND BREAST:  Denies breast mass or vaginal discharge.  Status post tubal ligation.  ENDOCRINE:  Denies polyuria, nocturia, hypothyroidism or diabetes.  HEMATOLOGIC AND LYMPHATIC:  No anemia, easy bruising, bleeding.  INTEGUMENTARY:  No acne, rash, lesions.  MUSCULOSKELETAL:  No joint pain in the neck and back.  Denies shoulder pain.  NEUROLOGIC:  Denies vertigo, ataxia.  PSYCHIATRIC:  No ADD or OCD.   PHYSICAL EXAMINATION:  VITAL SIGNS:  Temperature 96.7, pulse 127, respirations 24, blood pressure 101/73, pulse ox 100% on BiPAP.  GENERAL APPEARANCE:  Not under acute distress.  Moderately built and nourished.  HEENT:  Normocephalic, atraumatic.  Pupils are equal, reacting to light and accommodation.  No scleral icterus.  No conjunctival injection.  On BiPAP mask.  NECK:  Supple.  No JVD.   No thyromegaly.  Range of motion is intact.  LUNGS:  Minimal to moderate air entry with coarse bronchial breath sounds, diffuse wheezing, but no accessory muscle usage.  No crackles.   CARDIOVASCULAR:  S1, S2 normal, tachycardic as she just received nebulizer treatments.  No murmurs.  GASTROINTESTINAL:  Soft, obese.  Bowel sounds are positive in all four quadrants.  Nontender, nondistended.  No hepatosplenomegaly.  No masses felt.  NEUROLOGIC:  Awake, alert, oriented x 3.  Motor and sensory grossly intact.  Reflexes are 2+.  Following verbal commands.  Cranial nerves II through XII are grossly intact.  Reflexes are 2+.  EXTREMITIES:  No edema.  No cyanosis.  No clubbing.  SKIN:  Warm to touch.  Normal turgor.  No rashes.  No lesions.  MUSCULOSKELETAL:  No joint effusion, tenderness, erythema.  PSYCHIATRIC:  Normal mood and affect.   LABORATORY AND IMAGING STUDIES:  Chest x-ray is ordered, which is pending.  LFTs are normal.  First set of cardiac enzymes, CK total 133, CPK-MB 4.7.  Troponin less than 0.02.  CBC is normal.  Chem-8:  Glucose is at 141, potassium 3.4.  The rest of the Chem-8 is normal.  EKG with a lot of artifact, tachycardic at 145, nonspecific ST-T wave changes.   ASSESSMENT AND PLAN:  A 59 year old African American female presenting to the ER with a chief complaint of shortness of breath and dry cough since yesterday evening will be admitted with the following assessment and plan.  1.  Acute exacerbation of chronic obstructive pulmonary disease/acute on chronic respiratory failure.  We will admit her to telemetry.  Continue BiPAP.  Intravenous Solu-Medrol 60 mg intravenous q. 6 hours, provide nebulizer treatments and intravenous levofloxacin.  Chest x-ray is ordered which is pending.  The patient is encouraged to follow up with her pulmonologist as scheduled on April 15th.  Resume her home medications including Spiriva and loratadine.  The patient can take QVAR  which is her home  medications as they are not available in the hospital pharmacy.  2.  Gastroesophageal reflux disease.  We will provide her Protonix.  3.  Hypertension.  Blood pressure is on the lower side.  If it is normal the patient can have her home medications hydrochlorothiazide, amlodipine and resume the rest of the medications if blood pressure is stable.  4.  We will provide her gastrointestinal and deep vein thrombosis prophylaxis.  5.  CODE STATUS:  SHE IS FULL CODE.   Diagnosis and plan of care was discussed in detail with the patient.  She is aware of the plan.   Total time spent on the admission is 45 minutes.      ____________________________ Ramonita Lab, MD ag:ea D: 06/03/2013 02:10:43 ET T: 06/03/2013 03:04:24 ET JOB#: [id number]  cc: Ramonita Lab, MD, <Dictator> Ramonita Lab MD ELECTRONICALLY SIGNED 06/16/2013 23:33

## 2014-07-22 NOTE — H&P (Signed)
PATIENT NAME:  Julie Foley, Julie Foley MR#:  [id number] DATE OF BIRTH:  May 01, 1955  DATE OF ADMISSION:  11/26/2013  REFERRING PHYSICIAN: Gladstone Pih, MD  PRIMARY CARE PHYSICIAN: Nonlocal out of [facility].   CHIEF COMPLAINT: Shortness of breath.  HISTORY OF PRESENT ILLNESS: A 59 year old African American female with a past medical history of COPD and chronic respiratory failure requiring 4 L nasal cannula at baseline as well as a history of medical and oxygen noncompliance, presenting with shortness of breath. She describes 2- to 3-day duration of shortness of breath, originally as dyspnea on exertion; however, now progressed to having shortness of breath at rest, and it is getting gradually worsened through the course of the last few days. No associated fevers, chills, chest pain, orthopnea, or edema. She does describe having a cough, nonproductive, for the last 1 day duration. Given her worsening symptoms, decided to present to the hospital for further workup and evaluation. On arrival to the Emergency Room, was noted to be hypoxemic, requiring BiPAP therapy given her respiratory status. She currently remains on BiPAP; however, her respiratory status has improved. Currently saturating 100%. Able to come off of her BiPAP therapy. Complaining only of anxiety at this time.  REVIEW OF SYSTEMS:  CONSTITUTIONAL: Denies fevers, chills.  EYES: Denies blurred vision, double vision, eye pain. ENT:  Denies tinnitus, ear pain, hearing loss.  RESPIRATORY: Positive for cough, shortness of breath, and wheezing as described above.  CARDIOVASCULAR: Denies chest pain, palpitations, edema. GASTROINTESTINAL: Denies nausea, vomiting, diarrhea, abdominal pain.  GENITOURINARY: Denies dysuria or hematuria. ENDOCRINE: Denies nocturia or thyroid problems. HEMATOLOGIC AND LYMPHATIC: Denies easy bruising, bleeding.  SKIN: Denies rash or lesion. MUSCULOSKELETAL: Denies pain in neck, back, shoulder, knees, hips, or arthritic  symptoms.  NEUROLOGIC: Denies paralysis, paresthesias.  PSYCHIATRIC: Positive for anxiety as described above. Denies any depressive symptoms.  Otherwise, full review of systems was performed and was negative.   PAST MEDICAL HISTORY: COPD with chronic respiratory failure requiring 4 L nasal cannula at baseline, gastroesophageal reflux disease, as well as tobacco abuse.   SOCIAL HISTORY: Positive for every-day tobacco use as well as occasional alcohol usage. Denies any drug usage.   FAMILY HISTORY: Positive for unknown cancer. However, no known cardiovascular or pulmonary disorders.   ALLERGIES: BUDESONIDE AS WELL AS FORMOTEROL AS WELL AS SYMBICORT.   HOME MEDICATIONS: Include enalapril 20 mg p.o. b.i.d., loratadine 10 mg p.o. q. daily, alprazolam 0.5 mg 1 tablet p.o. b.i.d. as needed for anxiety, Advair 500/50 mcg inhalation 1 puff b.i.d., DuoNeb treatments 3 mL 4 times daily as needed for shortness of breath, Proventil 90 mcg inhalation 2 puffs 4 times daily as needed for shortness of breath, Norvasc 5 mg p.o. q. daily, hydrochlorothiazide 25 mg p.o. q. daily, Pepcid 20 mg p.o. b.i.d., montelukast 10 mg p.o. at bedtime, Flonase 50 mcg inhalation once daily, Qvar 80 mcg inhalation 1 puff b.i.d., Daliresp 500 mcg p.o. q. daily.   PHYSICAL EXAMINATION:  VITAL SIGNS: Temperature 97.6, heart rate 138, respirations 24, blood pressure 165/146 on arrival, currently 103/79, saturating 100% on BiPAP therapy, weight 72.6 kg, BMI 29.3.  GENERAL: Ill-appearing, African American female, currently in minimal to moderate distress given respiratory status.  HEAD: Normocephalic, atraumatic.  EYES: Pupils equal, round, reactive to light. Extraocular muscles intact. No scleral icterus.  MOUTH: Moist mucous membranes. Dentition intact. No abscess noted. EARS, NOSE, THROAT: Clear without exudates. No external lesions.  NECK: Supple. No thyromegaly, no nodules, no JVD.  PULMONARY: Diffuse expiratory wheezing heard  throughout all lung fields. No use of accessory muscles, though tachypneic. Good respiratory effort on BiPAP therapy.  CHEST: Nontender to palpation.  CARDIOVASCULAR: S1, S2, tachycardic. No murmurs, rubs, or gallops. No edema. Pedal pulses 2+ bilaterally.  GASTROINTESTINAL: Soft, nontender, nondistended. No masses. Positive bowel sounds. No hepatosplenomegaly.  MUSCULOSKELETAL: No swelling, clubbing, or edema. Range of motion full in all extremities.  NEUROLOGIC: Cranial nerves II through XII intact. No gross focal neurologic deficits. Sensations intact. Reflexes intact.  SKIN: No ulceration, lesions, rashes, or cyanosis. Skin warm and dry. Turgor intact.  PSYCHIATRIC: Mood and affect appear anxious. She is awake, alert, oriented x 3. Insight and judgment intact.  LABORATORY DATA: Sodium 144, potassium 4.3, chloride 109, bicarbonate 26, BUN 11, creatinine 0.9, glucose 127. LFTs within normal limits. WBC 9, hemoglobin 14.1, platelets of 336,000.  IMAGING: Chest x-ray performed revealing no acute cardiopulmonary process.  ASSESSMENT AND PLAN: A 59 year old [place] female with a history of chronic obstructive pulmonary disease with chronic respiratory failure requiring 4 L nasal cannula at baseline as well as medical noncompliance, presenting with shortness of breath. 1.  Chronic obstructive pulmonary disease exacerbation with continued tobacco abuse. supplemental oxygen and keep SaO2 greater than 92%. DuoNeb treatments q. 4 hours. Steroids 60 mg IV q. daily, azithromycin, and continue her other home medications.  2.  Hypertension. Continue Norvasc and will have her hold hydrochlorothiazide.  3.  Gastroesophageal reflux disease. Continue with H2 blocker.  4.  Venous thromboembolism prophylaxis with heparin subcutaneously.  CODE STATUS: Patient is full code.   TIME SPENT: 45 minutes.    ____________________________ Cletis Athensavid K. Hower, MD dkh:ST D: 11/26/2013 23:39:20 ET T: 11/27/2013  00:20:53 ET JOB#: [id number]  cc: Cletis Athensavid K. Hower, MD, <Dictator> DAVID Synetta ShadowK HOWER MD ELECTRONICALLY SIGNED 11/29/2013 1:42

## 2014-07-22 NOTE — Discharge Summary (Signed)
PATIENT NAME:  Julie Foley, Julie Foley MR#:  [id number] DATE OF BIRTH:  09-17-1955  DATE OF ADMISSION:  05/12/2013 DATE OF DISCHARGE:  05/13/2013  DISCHARGE DIAGNOSES:  1.  Acute on chronic respiratory failure secondary to chronic obstructive pulmonary disease exacerbation.  2.  Anxiety.   DISCHARGE MEDICATIONS:  1.  QVAR 80 mcg one puff b.i.d.  2.  Daliresp 500 mcg one puff daily.  3.  Singulair 10 mg p.o. daily.  4.  Spiriva 18 mcg inhalation daily.  5.  Famotidine 20 mg p.o. b.i.d.  6.  Hydrochlorothiazide 20 mg p.o. daily.  7.  Amlodipine 5 mg p.o. daily.  8.  Nitrostat sublingual p.r.n. for chest pain.  9.  Prednisone 20 mg two tablets daily for 2 days, one tablet daily for 2 days and then stop.  10.  Xanax 0.5 mg p.o. daily and 10 tablets are prescribed.  11.  Levaquin 500 mg p.o. daily for 5 days.   CONSULTATIONS: None.   OXYGEN: Two liters per nasal cannula at home.   HOSPITAL COURSE: This is a 59 year old female patient, with history of COPD who [facility], who comes in with shortness of breath. The patient has 4 liters of oxygen at baseline and also she has hypertension. Comes in because of shortness of breath, cough and productive phlegm. The patient was seen on the 12th then went home, and the patient came in because of shortness of breath and the patient's O2 saturations were in the 70s by EMS.   Initially, she was started on BiPAP, admitted to ICU. The patient weaned off of BiPAP and continued on 4 liters. The patient received IV Solu-Medrol and Levaquin. Today morning, the patient really wanted to go home and she feels like her baseline. She has no wheezing on exam but does have decreased breath sounds bilaterally. The patient says that she is on azithromycin 250 mg daily on chronic dose for a long time. I told her that she can continue the azithromycin  and corticosteroid along with Levaquin and [facility]. The patient also asking for  some anxiety medication because of stress going on and family issues, and I gave her Xanax 0.5 mg for 5 days and [facility]   Regarding high blood pressure, we continued the Norvasc and hydrochlorothiazide.    The patient was sent home in stable condition.   TIME SPENT ON DISCHARGE PREPARATION: More than 30 minutes.   ____________________________ Katha HammingSnehalatha Lynsey Ange, MD sk:np D: 05/13/2013 15:45:34 ET T: 05/13/2013 16:22:02 ET JOB#: [id number]  cc: Katha HammingSnehalatha Adeline Petitfrere, MD, <Dictator> Katha HammingSNEHALATHA Ryeleigh Santore MD ELECTRONICALLY SIGNED 05/25/2013 15:36

## 2014-07-28 ENCOUNTER — Inpatient Hospital Stay
Admit: 2014-07-28 | Disposition: A | Discharge status: Home / Self Care | Payer: Self-pay | Attending: Internal Medicine | Admitting: Internal Medicine

## 2014-07-28 LAB — COMPREHENSIVE METABOLIC PANEL
ALBUMIN: 4.7 g/dL
Alkaline Phosphatase: 54 U/L
Anion Gap: 8 (ref 7–16)
BUN: 18 mg/dL
Bilirubin,Total: 0.4 mg/dL
CALCIUM: 9.4 mg/dL
CHLORIDE: 106 mmol/L
CO2: 29 mmol/L
Creatinine: 0.81 mg/dL
EGFR (African American): >60
EGFR (Non-African Amer.): >60
GLUCOSE: 155 mg/dL — AB
Potassium: 4.1 mmol/L
SGOT(AST): 22 U/L
SGPT (ALT): 21 U/L
SODIUM: 143 mmol/L
Total Protein: 7.4 g/dL

## 2014-07-28 LAB — CBC
HCT: 40.4 % (ref 35.0–47.0)
HGB: 13.2 g/dL (ref 12.0–16.0)
MCH: 33.2 pg (ref 26.0–34.0)
MCHC: 32.7 g/dL (ref 32.0–36.0)
MCV: 102 fL — ABNORMAL HIGH (ref 80–100)
PLATELETS: 400 10*3/uL (ref 150–440)
RBC: 3.98 10*6/uL (ref 3.80–5.20)
RDW: 14.3 % (ref 11.5–14.5)
WBC: 8.9 10*3/uL (ref 3.6–11.0)

## 2014-07-28 LAB — TROPONIN I: Troponin-I: <0.03 ng/mL

## 2014-07-28 LAB — CK TOTAL AND CKMB (NOT AT ARMC)
CK, TOTAL: 233 U/L
CK-MB: 11 ng/mL — ABNORMAL HIGH

## 2014-07-30 NOTE — H&P (Signed)
PATIENT NAME:  Julie Foley, GAMBREL MR#:  [id number] DATE OF BIRTH:  10-13-1955  DATE OF ADMISSION:  07/28/2014  REFERRING EMERGENCY ROOM PHYSICIAN: Phineas Semen, MD  PRIMARY CARE PHYSICIAN: Nonlocal.  PRIMARY PULMONOLOGIST: [facility]  ADMITTING PHYSICIAN: Jonnie Kind, MD  CHIEF COMPLAINT: Shortness of breath with wheezing.  HISTORY OF PRESENT ILLNESS: A 59 year old African American female with a history of hypertension, COPD on home oxygen 3 liters per minute, history of congestive heart failure, and gastroesophageal reflux disease who was brought in with the complaints of shortness of breath with wheezing. The patient stated that while she was sitting on her porch she was exposed to the pollen following which she developed shortness of breath with wheezing. Despite taking her nebulizers her symptoms did not improve, hence was brought to the Emergency Room for further evaluation. In the Emergency Room, on arrival, the patient was noted to be with acute respiratory distress with hypoxia, hence was placed on BiPAP. The patient was evaluated by the ED physician and was found to be afebrile with stable vitals. The patient was treated with IV Solu-Medrol, vigorous DuoNebs. Work-up revealed normal lab work and a chest x-ray negative for any acute cardiopulmonary pathology. Following treatment with oxygen supplementation through BiPAP and Solu-Medrol and vigorous DuoNebs, her respiratory status started improving. She gradually was transitioned to oxygen supplementation through nasal cannula. Currently, she is being maintained on O2 through nasal cannula at 3 liters per minute. She still has significant bilateral wheezing, hence hospitalist service was consulted for further management. The patient denies any fevers, chills. No chest pain. Denies any nausea, vomiting, diarrhea. No abdominal pain. No dysuria, frequency, urgency.  PAST MEDICAL HISTORY: 1.  COPD, on home oxygen, 3 liters per minute.  2.   Hypertension.  3.  Congestive heart failure, etiology unknown.  4.  Gastroesophageal reflux disease.   PAST SURGICAL HISTORY: 1.  C-section.  2.  Tubal ligation.  FAMILY HISTORY: Significant for hypertension.   ALLERGIES: BUDESONIDE, FORMOTEROL, PENICILLIN AND SYMBICORT.   SOCIAL HISTORY: She is single and lives at home. She has home health care that helps her deal with medical equipment. History of smoking in the past, quit a few months ago. Denies any alcohol or substance abuse.   MEDICATIONS:  1.  Advair Diskus 500/50 mcg 1 puff inhalation 2 times a day.  2.  Albuterol inhalation solution every 4 hours as needed for shortness of breath.  3.  Amlodipine 5 mg 1 tablet orally once a day.  4.  Daliresp 500 mcg oral tablet 1 tablet orally once a day.  5.  DuoNeb inhalation solution 4 times a day as needed for shortness of breath.  6.  Enalapril 20 mg 1 tablet orally 2 times a day.  7.  Famotidine 20 mg oral tablet 1 tablet orally 2 times a day.  8.  Fluticasone inhalation powder 1 puff inhalation 2 times a day.  9.  Hydrochlorothiazide 25 mg 1 tablet orally once a day.  10.  Ipratropium inhalation solution inhaled 4 times a day as needed for shortness of breath.  11.  Loratadine 10 mg 1 tablet orally once a day.  12.  Montelukast 10 mg 1 tablet orally once a day.  13.  Nitrostat sublingual tablet as needed for chest pain.  14.  Proventil inhalation 1 puff every 4 hours as needed for shortness of breath.  15.  Qvar 80 mcg inhaled aerosol 1 puff 2 times a day.  16.  Spiriva 18 mcg inhalation capsule  1 capsule inhalation once a day.   REVIEW OF SYSTEMS: CONSTITUTIONAL: Negative for fever, chills. No fatigue or weakness.  EYES: Negative for blurred vision, double vision. No pain, redness or discharge.  EARS, NOSE, AND THROAT: Negative for tinnitus, ear pain, hearing loss, epistaxis, nasal discharge.  RESPIRATORY: Positive for shortness of breath with wheezing. Positive for dry cough.  Negative for hemoptysis or painful respiration.  CARDIOVASCULAR: Negative for chest pain, palpitations, dizziness, syncopal episodes, orthopnea, dyspnea on exertion, or pedal edema.  GASTROINTESTINAL: Negative for nausea, vomiting, diarrhea, constipation, abdominal pain, hematemesis, melena. GENITOURINARY: Negative for dysuria, frequency, urgency, or hematuria.  ENDOCRINE: Negative for polyuria, nocturia.  HEME AND LYMPH: Negative for anemia, easy bruising or bleeding, or swollen glands.  INTEGUMENTARY: Negative for acne, skin rash. MUSCULOSKELETAL: Negative for neck or back pain. No history of arthritis or gout.  NEUROLOGICAL: Negative for focal weakness or numbness. No history of CVA, TIA or seizure disorder.  PSYCHIATRIC: Negative for anxiety, insomnia, or depression.   PHYSICAL EXAMINATION: VITAL SIGNS: Temperature 98.2 degrees Fahrenheit, pulse rate 146 per minute on arrival, current pulse rate is 123 per minute, respirations on arrival 31  per minute, currently respiration rate is 18 per minute, current blood pressure 109/78, O2 saturation 96% on 3 liters oxygen through nasal cannula.  GENERAL: Well developed, well nourished, alert, in no acute distress, comfortably resting in the bed.  HEAD: Atraumatic, normocephalic.  EYES: Pupils are equal and react to light and accommodation. No conjunctival pallor. No icterus. Extraocular movements intact.  NOSE: No drainage.  EARS: No drainage.  ORAL CAVITY: No mucosal lesions. No exudates.  NECK: Supple. No JVD. No thyromegaly. No carotid bruit. Range of motion of neck within normal limits.  RESPIRATORY: Good respiratory effort. Not using accessory muscles of respiration. Bilateral air entry present. Bilateral diffuse rhonchi present. No rales.  CARDIOVASCULAR: S1, S2 regular. No murmurs, gallops, or clicks. Pulses equal at carotid, femoral, and pedal pulses. No peripheral edema.  GASTROINTESTINAL: Abdomen is soft, nontender. No hepatosplenomegaly.  No masses noticed. No guarding. Bowel sounds present and equal in all 4 quadrants.  GENITOURINARY: Deferred.  MUSCULOSKELETAL: No joint tenderness or effusion. Range of motion is adequate. Strength and tone equal bilaterally.  SKIN: Inspection within normal limits.  LYMPHATIC: No cervical lymphadenopathy.  VASCULAR: Good dorsalis pedis and posterior tibial pulses.  NEUROLOGIC: Alert, awake, and oriented x3. Cranial nerves II through XII grossly intact. No sensory deficit. Motor strength 5/5 in upper and lower extremities. DTRs 2+ bilateral and symmetrical. Plantars downgoing.  PSYCHIATRIC: Alert, awake, and oriented x3. Judgment and insight adequate. Memory and mood within normal limits.   DIAGNOSTIC DATA: Serum glucose 155, otherwise metabolic panel unremarkable. Liver functions normal. Total CK 233. Troponin less than 0.03. WBC 8.9, hemoglobin 13.2, hematocrit 40.4, platelet count 400,000.   Chest x-ray: No acute cardiopulmonary disease.   EKG: Sinus tachycardia with ventricular rate of 146 beats per minute, nonspecific ST-T changes.   ASSESSMENT AND PLAN: A 59 year old [place] American female with a history of chronic obstructive pulmonary disease on home oxygen 4 liters per minute, hypertension, gastroesophageal reflux disease, and congestive heart failure who presents with shortness of breath with wheezing following exposure to pollen, found to have acute and chronic respiratory failure needing BiPAP on arrival and having chronic obstructive pulmonary disease exacerbation secondary to exposure to pollen.  1.  Acute on chronic respiratory failure with hypoxia secondary to chronic obstructive pulmonary disease exacerbation. Initially placed on BiPAP, currently on home oxygen supplementation through nasal cannula,  3 liters per minute.  2.  Chronic obstructive pulmonary disease exacerbation secondary to exposure to pollen, possible acute bronchitis. Plan: Admit to the medical floor. Oxygen  supplementation. Follow up oxygen saturations. IV Solu-Medrol. Sputum for culture and sensitivity. Intravenous azithromycin. Vigorous DuoNebs. Continue home medications.  3.  Hypertension. Stable on home medications.  4.  Gastroesophageal reflux disease. Stable on famotidine. Continue same. 5.  Deep vein thrombosis prophylaxis. Subcutaneous Lovenox.  6.  History of congestive heart failure. The patient is stable. Monitor.  7.  Gastrointestinal prophylaxis. Famotidine.   CODE STATUS: FULL.  TIME SPENT: 50 minutes.    ____________________________ Crissie FiguresEdavally N. Sophiah Rolin, MD enr:sb D: 07/28/2014 07:03:05 ET T: 07/28/2014 07:33:25 ET JOB#: [id number]  cc: Crissie FiguresEdavally N. Sanay Belmar, MD, <Dictator> Pulmonologist - [facility] Crissie FiguresEDAVALLY N Francisca Harbuck MD ELECTRONICALLY SIGNED 07/28/2014 16:49

## 2014-07-30 NOTE — Discharge Summary (Signed)
PATIENT NAME:  Julie Foley, Julie Foley MR#:  [id number] DATE OF BIRTH:  [birth year]  DATE OF ADMISSION:  07/12/2014 DATE OF DISCHARGE:  07/13/2014  For a detailed note please check at the history and physical done on admission by Dr. Sheryle Hailiamond.   DIAGNOSES AT DISCHARGE:  1.  Acute on chronic respiratory failure secondary to chronic obstructive pulmonary disease exacerbation.  2.  Chronic obstructive pulmonary disease exacerbation.  3.  Hypertension.   DIET:  The patient is being discharged on a low-sodium diet.   ACTIVITY: As tolerated.   FOLLOWUP: With [facility] primary care physician [facility]   DISCHARGE MEDICATIONS: Advair 500/50 one puff b.i.d., famotidine 20 mg b.i.d., Qvar 80 mcg 1 puff b.i.d., albuterol inhaler 4 times daily as needed, albuterol nebulizer every 4 hours as needed, amlodipine 5 mg daily, Daliresp 500 mcg daily, HCTZ 25 mg daily, loratadine 10 mg daily, Singulair 10 mg daily, Spiriva 1 puff daily, enalapril 20 mg b.i.d., sublingual nitroglycerin as needed, prednisone taper starting at 60 mg down to 10 mg over the next 12 days, and Zithromax 500 mg daily x 3 days.   CONSULTANTS DURING THE HOSPITAL COURSE: Dr. Dema SeverinMungal from pulmonary.   PERTINENT STUDIES DONE DURING THE HOSPITAL COURSE: A chest x-ray done on admission showing no acute cardiopulmonary process.   BRIEF HOSPITAL COURSE: This is a 59 year old female with medical problems as mentioned above who presented to the hospital with shortness of breath and noted to be in acute on chronic respiratory failure.   1.  Acute on chronic hypoxic hypercarbic respiratory failure. This was likely secondary to COPD exacerbation. The patient was initially started on BiPAP and eventually weaned off of it, remained on nasal cannula, and has done well on that. The patient was on IV steroids, has now been tapered over to oral prednisone. Her chest x-ray was negative for pneumonia as mentioned. She will continue her maintenance  medications for her COPD and the prednisone taper and Zithromax as stated.  2.  COPD exacerbation. This was likely the cause of the patient's acute on chronic respiratory failure. This was likely possibly secondary to acute bronchitis. The patient was admitted to the hospital, started on IV steroids, around-the-clock nebulizer treatments, also maintained on her maintenance inhalers including Spiriva, Dulera, and given Daliresp. A pulmonary consult was obtained. They agreed with this management. Also continued BiPAP intermittently during the day, at night, and also during her naps. The patient's symptoms have significantly improved. She has no more wheezing and bronchospasm, therefore being discharged on her maintenance medications along with a prednisone taper and Zithromax with close followup with her pulmonologist as an outpatient.  3.  Hypertension. The patient remained hemodynamically stable. She will continue on Norvasc, HCTZ, and enalapril as stated.   CODE STATUS: The patient is a full code.   DISPOSITION: She is being discharged home.   TIME SPENT ON DISCHARGE: 35 minutes.     ____________________________ Rolly PancakeVivek J. Cherlynn KaiserSainani, MD vjs:bu D: 07/13/2014 15:48:54 ET T: 07/13/2014 16:18:03 ET JOB#: [id number]  cc: Rolly PancakeVivek J. Cherlynn KaiserSainani, MD, <Dictator> Houston SirenVIVEK J Wilmar Prabhakar MD ELECTRONICALLY SIGNED 07/19/2014 11:29

## 2014-07-30 NOTE — H&P (Signed)
PATIENT NAME:  Julie Foley, Deshante L MR#:  [id number] DATE OF BIRTH:  [birth year]  DATE OF ADMISSION:  07/12/2014  REFERRING PHYSICIAN:  Dr. Chiquita LothJade Sung.    PRIMARY CARE DOCTOR: Nonlocal.   ADMIT DIAGNOSIS: Acute on chronic respiratory failure.   HISTORY OF PRESENT ILLNESS: This is a 59 year old African American female who presents to the Emergency Department complaining of difficulty breathing. The patient states that she has had gradually worsening shortness of breath over the last few days. She only really began coughing tonight after receiving a few nebulizer treatments. She is concerned that her new nebulizer is not working as well as the machine she previously owned. She also notes that she cannot get the kinking out of her oxygen concentrator tubing. The patient received 4 DuoNebs total by EMS and in the Emergency Department, but was still very wheezy and had significant tightness in her chest which prompted the Emergency Department to call for admission.   REVIEW OF SYSTEMS:   CONSTITUTIONAL: The patient denies fever, but admits to weakness particularly after this episode of respiratory distress.  EYES: Denies blurred vision or inflammation.  EARS, NOSE AND THROAT: Denies tinnitus or sore throat.  RESPIRATORY: Admits to coughing that began tonight as well as shortness of breath.  CARDIOVASCULAR: Denies chest pain, palpitations, orthopnea, or paroxysmal nocturnal dyspnea.  GASTROINTESTINAL: Denies nausea, vomiting, diarrhea, or abdominal pain.  GENITOURINARY: Denies dysuria, increased frequency, or hesitancy of urination.  ENDOCRINE: Denies polyuria or polydipsia.  HEMATOLOGIC AND LYMPHATIC:  Denies easy bruising or bleeding.  INTEGUMENTARY: Denies rashes or lesions.  ENDOCRINE:  Denies polyuria or polydipsia.  MUSCULOSKELETAL: Denies arthralgias or myalgias.  NEUROLOGIC: Denies numbness in her extremities or dysarthria.  PSYCHIATRIC: Denies depression or suicidal ideation.   PAST MEDICAL  HISTORY: COPD, hypertension, gastroesophageal reflux disease.   PAST SURGICAL HISTORY: The patient has not had any surgery.   SOCIAL HISTORY: The patient lives at home. She has home health care that helps her adjust some of her durable medical equipment. She states that she has quit smoking for a few months now, but is still exposed to secondhand smoke at home. She denies alcohol or drug use.   FAMILY HISTORY: Significant for hypertension.   MEDICATIONS:  1. Advair Diskus 500 mcg-50 mcg inhaled powder 1 puff inhaled 2 times a day.  2. Albuterol 2.5 mg per 3 mL solution, 1 nebulizer treatment every 4 hours needed for coughing, wheezing, or shortness of breath.  3. Amlodipine 5 mg 1 tab p.o. daily.  4. Daliresp 500 mcg 1 tablet p.o. daily.  5. Enalapril 20 mg 1 tablet p.o. b.i.d.  6. Famotidine 20 mg 1 tablet p.o. b.i.d.  7. Hydrochlorothiazide 25 mg 1 tablet p.o. daily.  8. Levofloxacin 750 mg 1 tablet p.o. once daily.   9. Loratadine 10 mg 1 tablet p.o. daily.  10. Montelukast 10 mg 1 tablet p.o. daily.  11. Nitrostat 0.4 mg 1 tablet sublingually every 5 minutes as needed for chest pain.  12. Prednisone 20 mg 3 tablets p.o. once daily.  13. Proventil high flow inhaler 2 puffs every 4 hours as needed for coughing, wheezing, or shortness of breath.  14. Qvar 80 mcg 1 puff as needed 2 times a day (she does not use this at the same time as her Advair).  15. Spiriva 18 mcg 1 capsule inhaled once a day.       ALLERGIES: BUDESONIDE, FORMOTEROL, PENICILLIN, AND SYMBICORT.   PERTINENT LABORATORY RESULTS AND RADIOGRAPHIC FINDINGS: Serum glucose is 132,  BUN 19, creatinine 1.02, serum sodium is 143, potassium is 3.9, chloride is 106, bicarbonate 31, calcium is 9.9. Albumin is 4.7. Alkaline phosphatase 53, AST is 20, ALT is 23. Troponin is negative. White blood cell count is 9.4, hemoglobin 13.9, hematocrit 43.4, platelet count 297,000, MCV is 102. PH is 7.29, pCO2 is 72, pCO2 is 89 on FiO2 of 36%,  base excess of 2. Chest x-ray shows no acute cardiopulmonary process.     PHYSICAL EXAMINATION:  VITAL SIGNS: Temperature is 98.2, pulse 114, respirations 18, blood pressure 102/69, pulse oximetry is 98% on 4 liters oxygen via nasal cannula.  GENERAL: The patient is alert and oriented x 3. She is in mild respiratory distress.  HEENT: Normocephalic, atraumatic. Pupils equal, round, and reactive to light and accommodation. Extraocular movements are intact. Mucous membranes are moist.  NECK: Trachea is midline. No adenopathy. Thyroid is nonpalpable and nontender.  CHEST: Symmetric, atraumatic  CARDIOVASCULAR: Regular rate and rhythm. Normal S1, S2. No rubs, clicks, or murmurs appreciated.  LUNGS: There is wheezing throughout all the lung fields. She has moderate air movement. There are no areas of dullness to percussion.  ABDOMEN: Positive bowel sounds. Soft, nontender, nondistended. No hepatosplenomegaly.  GENITOURINARY: Deferred.  MUSCULOSKELETAL: The patient moves all 4 extremities equally. There is 5 out of 5 strength in upper and lower extremities bilaterally.  SKIN: Warm and dry. There are no rashes or lesions.  EXTREMITIES: No clubbing, cyanosis, or edema.  NEUROLOGIC: Cranial nerves II through XII are grossly intact.  PSYCHIATRIC: Mood is normal. Affect is congruent. The patient has good judgment and insight into her medical condition.   ASSESSMENT AND PLAN: This is a 59 year old female admitted for acute on chronic respiratory failure with hypoxemia and hypercarbia.   1.  Acute on chronic respiratory failure. The patient is essentially at her baseline per ABG. This is likely an exacerbation of her chronic obstructive pulmonary disease. We will continue albuterol nebulizers as needed. I have also continued her Advair, Spiriva, and Daliresp.  I have changed the patient's Levaquin to IV form for now and we will resume p.o. antibiotics later. I have placed her on a very long steroid taper and  we will continue to monitor her respiratory status.  2.  Hypertension. Continue enalapril and amlodipine.  3.  Obesity. The patient's BMI is 31.2. I have encouraged a balanced diet and pulmonary rehabilitation.  4.  Deep vein thrombosis prophylaxis. Heparin.  5.  Gastrointestinal prophylaxis. None.   CODE STATUS: The patient is a full code.   TIME SPENT ON ADMISSION ORDERS AND PATIENT CARE: Approximately 35 minutes.     ____________________________ Kelton Pillar. Sheryle Hail, MD msd:bu D: 07/12/2014 17:10:16 ET T: 07/12/2014 17:30:35 ET JOB#: [id number]  cc: Kelton Pillar. Sheryle Hail, MD, <Dictator> Kelton Pillar Tarrance Januszewski MD ELECTRONICALLY SIGNED 07/19/2014 9:44

## 2014-07-30 NOTE — Discharge Summary (Signed)
PATIENT NAME:  Julie Foley, Julie Foley MR#:  [id number] DATE OF BIRTH:  Apr 07, 1955  DATE OF ADMISSION:  04/16/2014 DATE OF DISCHARGE:  04/16/2014  AGAINST MEDICAL ADVICE SUMMARY:    ADMITTING DIAGNOSIS: Shortness of breath.   HOSPITAL COURSE: The patient is a 59 year old African-American female with history of chronic respiratory failure on 4 liters of O2, who presented with shortness of breath progressively getting worse for the past 4 days. The patient was seen in the ED and was placed on a BiPAP and was given nebulizers. She continued to have shortness of breath and was admitted to the ICU. I saw the patient in the ICU early in the morning. When I arrived she stated that she was ready to leave. I explained to her that she has a severe COPD exacerbation and needs to stay in the hospital. She reports that she has everything that she needs at home that we have here and does not want to stay. I strongly recommended that she stay in the hospital and explained to her that the potential adverse effect on her body could be death. The patient still refused and stated that she will home. Therefore she signed AMA against advice. Due to her severity of illness I did give her a prednisone taper and a Levaquin antibiotic prescription.   TIME SPENT: 35 minutes.     ____________________________ Lacie ScottsShreyang H. Allena KatzPatel, MD shp:bu D: 04/17/2014 16:42:29 ET T: 04/17/2014 16:59:06 ET JOB#: [id number]  cc: Deztiny Sarra H. Allena KatzPatel, MD, <Dictator> Charise CarwinSHREYANG H [name] MD ELECTRONICALLY SIGNED 04/19/2014 14:42

## 2014-07-30 NOTE — H&P (Signed)
PATIENT NAME:  Julie Foley, Julie Foley MR#:  [id number] DATE OF BIRTH:  1955-09-24  DATE OF ADMISSION:  04/16/2014  REFERRING PHYSICIAN: Kandee Keen R. York Cerise, MD  PRIMARY CARE PHYSICIAN: Nonlocal.   ADMIT DIAGNOSIS: Acute on chronic respiratory failure with hypoxemia.   HISTORY OF PRESENT ILLNESS: This is a 59 year old African American female who presents to the Emergency Department with acute shortness of breath. The patient states that her shortness of breath has been progressively worsening over the last 4 days. She called EMS this evening and required BiPAP en route as her oxygen saturations were not improving on 6 liters of oxygen via nasal cannula. In the Emergency Room, the patient continued to require respiratory support after multiple DuoNebs, 2 doses of Solu-Medrol, and BiPAP therapy. On my physical examination the patient was sitting a tripod position in bed and appearing very air hungry. We discussed intubation with the patient at this time as she had significantly increased work of breathing, but she refused. Due to her non-improvement in respiratory status the Emergency Department called for admission.   REVIEW OF SYSTEMS:  CONSTITUTIONAL: The patient denies fever or weakness.  EYES: Denies blurred vision or inflammation.  EARS, NOSE, AND THROAT: Denies tinnitus or sore throat.  RESPIRATORY: Admits to cough and shortness of breath. The patient has a history of COPD.  CARDIOVASCULAR: She admits to chest pain under her ribs bilaterally as she has been breathing very hard for the last 4 days. She denies orthopnea or paroxysmal nocturnal dyspnea.  GASTROINTESTINAL: Denies nausea, vomiting, diarrhea, or abdominal pain.  GENITOURINARY: Denies dysuria, increased frequency, or hesitancy of urination.  ENDOCRINE: Denies polyuria or polydipsia.  HEMATOLOGIC AND LYMPHATIC: Denies easy bruising or bleeding.  INTEGUMENT: Denies rashes or lesions.  MUSCULOSKELETAL: Denies arthralgias or myalgias.   NEUROLOGIC: Denies numbness in her extremities or dysarthria.  PSYCHIATRIC: Denies depression or suicidal ideation.   PAST MEDICAL HISTORY: COPD, hypertension, gastroesophageal reflux disease.   PAST SURGICAL HISTORY: None.   SOCIAL HISTORY: The patient lives with her significant other. She denies any smoking, drinking, or illicit drug use at this time, but admits that she used to smoke crack. Most recent occasion was 4 months ago.   FAMILY HISTORY: The patient is aware of high blood pressure throughout the family, but does not know of any other chronic medical illnesses.   MEDICATIONS:  1.  Advair Diskus 500 mcg/50 mcg inhaler 1 puff inhaled 2 times a day.  2.  Alprazolam 0.5 mg 1 tablet 1 to 2 times a day as needed for anxiety.  3.  Amlodipine 5 mg 1 tablet p.o. daily.  4.  Daliresp 500 mcg 1 tablet p.o. daily.  5.  DuoNeb 2.5 mg/0.5 mg/3 mL inhaled solution 1 nebulizer treatment 4 times a day as needed. Do use when using Spiriva.  6.  Enalapril 20 mg 1 tablet p.o. b.i.d.  7.  Famotidine 20 mg 1 tablet p.o. b.i.d.  8.  Fluticasone nasal spray 50 mcg/inhalation 1 spray to each nostril once a day.  9.  Hydrochlorothiazide 25 mg 1 tablet p.o. daily.  10.  Loratadine 10 mg 1 tablet p.o. daily.  11.  Montelukast 10 mg 1 tablet p.o. every evening.  12.  Prednisone 10 mg, start with 60 mg and taper by 5 mg daily until complete.  13.  Proventil 90 mcg high flow inhaler 2 puffs 4 times a day as needed for coughing, wheezing, and shortness of breath.  14.  Qvar 80 mcg 1 puff inhaled 2 times  a day.   ALLERGIES: BUDESONIDE WITH FORMOTEROL AND SYMBICORT.   PERTINENT LABORATORY RESULTS AND RADIOGRAPHIC FINDINGS: Serum glucose is 125, BUN 11, creatinine 0.83, serum sodium is 141, potassium is 4.6, chloride is 104, bicarbonate is 30, calcium 9.2. Lipase is 57. Serum albumin is 4.3, alkaline phosphatase is 61, AST 34, ALT 39. Troponin is negative. White blood cell count is 9.2, hemoglobin is 13.9,  hematocrit 42.5, platelet count 378,000, MCV is 102. ABG shows pH of 7.35, pCO2 of 57, pO2 of 119 with a base excess of 4 on BiPAP with FiO2 of 32%, mechanical rate of 12. Chest x-ray shows no acute cardiopulmonary process.   PHYSICAL EXAMINATION:  VITAL SIGNS: Temperature is 97.9, pulse 140, respirations 29, blood pressure 135/88, pulse oximetry is 96% on BiPAP with FiO2 of 32%.  GENERAL: The patient is alert and oriented x 3. She is clearly in acute respiratory distress.  HEENT: Normocephalic, atraumatic. Pupils equal, round, and reactive to light and accommodation. Extraocular movements are intact. Mucous membranes are somewhat dry due to BiPAP. The noninvasive positive pressure mask is on her face.   NECK: Trachea is midline. No adenopathy. Thyroid is nonpalpable, nontender.  CHEST: Symmetric and atraumatic.  CARDIOVASCULAR: Tachycardic rate, normal rhythm. No rubs, clicks, or murmurs appreciated.  LUNGS: Clear to auscultation bilaterally, but there is poor to moderate air movement. The patient has expiratory wheeze heard occasionally.  ABDOMEN: Positive bowel sounds. Soft, nontender, nondistended. No hepatosplenomegaly.  GENITOURINARY: Deferred.  MUSCULOSKELETAL: The patient is able to stand and get on and off of the gurney on her own indicating she has 5/5 strength in upper and lower extremities bilaterally.  SKIN: Warm and dry. No rashes or lesions.  EXTREMITIES: No clubbing, cyanosis, or edema.  NEUROLOGIC: Cranial nerves II through XII are grossly intact.  PSYCHIATRIC: Mood is normal. Affect is congruent. The patient does not appear to have the best judgment, but she has excellent insight into her medical condition.   ASSESSMENT AND PLAN: This is a 60 year old female admitted for acute on chronic respiratory failure.  1.  Acute on chronic respiratory failure with hypoxia. The patient has exacerbation of chronic obstructive pulmonary disease as there is no infiltrates seen on chest x-ray.  She is on 4 liters of oxygen via nasal cannula at baseline at home and is requiring positive pressure, as well as more oxygen here in the hospital. Her ABG does not show hypercapnia, but the patient is not moving a lot of air and is using accessory muscles. She refuses to be intubated at this time, but insists on being a FULL CODE in the event that she is unable to speak for herself. I anticipate respiratory collapse and so I have admitted her to the Intensive Care Unit. We will continue Daliresp and Advair. I have ordered albuterol nebulizers every 2 hours as needed. I have started Levaquin for anti-inflammatory effect and to cover respiratory organisms.  2.  Hypertension. We will continue enalapril and amlodipine.  3.  Gastroesophageal reflux disease. We will give the patient H2-blocker as needed for acid reflux.  4.  Overweight. The patient's body mass index is 29.3. I have encouraged a balanced diet once she is over this episode. She should also have some pulmonary rehabilitation.  5.  Deep vein thrombosis prophylaxis. Lovenox.  6.  Gastrointestinal prophylaxis. None.   CODE STATUS: The patient is a FULL CODE.   TIME SPENT ON ADMISSION ORDERS AND CRITICAL PATIENT CARE: Approximately 40 minutes.   ____________________________ Kelton Pillar. Sheryle Hail,  MD msd:ts D: 04/16/2014 02:34:58 ET T: 04/16/2014 03:49:00 ET JOB#: [id number]  cc: Kelton PillarMichael S. Sheryle Hailiamond, MD, <Dictator> Kelton PillarMICHAEL S Sundance Moise MD ELECTRONICALLY SIGNED 04/25/2014 2:13

## 2014-07-30 NOTE — H&P (Signed)
PATIENT NAME:  Julie Foley, Julie Foley MR#:  [id number] DATE OF BIRTH:  1955-07-23  DATE OF ADMISSION:  05/06/2014  PRIMARY CARE PROVIDER: At [facility].   CHIEF COMPLAINT: Shortness of breath, cough.   HISTORY OF PRESENT ILLNESS: A 59 year old, African-American, female patient with history of COPD, chronic respiratory failure on 4 liters oxygen with recurrent admissions for COPD exacerbations, comes to the emergency room complaining of 3 days of worsening shortness of breath, wheezing and cough. The patient has been using her albuterol nebulizer continuously without any improvement. Here in the emergency room, the patient was treated with multiple nebulizers, IV steroids and Solu-Medrol. Treated also with BiPAP. The patient wanted to leave AMA, signed her papers but could barely walk to the door of her room and was tripoding and is being admitted. The patient is critically ill, may need intubation. The patient has left multiple times against medical advice after feeling a little improved, with the most recent incident being on 04/16/2014.   PAST MEDICAL HISTORY: 1. COPD.  2. Tobacco abuse.  3. Chronic respiratory failure on 4 liters oxygen.  4. Hypertension.  5. GERD.   PAST SURGICAL HISTORY: None.   SOCIAL HISTORY: The patient lives with her significant other. Quit smoking 2 months back. Does use crack.   FAMILY HISTORY: Hypertension in the family.   HOME MEDICATIONS: 1. Advair Diskus 500/50 - 1 puff inhaled 2 times a day.  2. Alprazolam 0.5 mg 1 to 2 times a day as needed for anxiety.  3. Amlodipine 5 mg daily.  4. Daliresp 5 mcg daily.  5. DuoNeb nebulizer 4 times a day as needed.  6. Enalapril 20 mg daily.  7. Lasix 20 mg 2 times a day.  8. Fluticasone nasal 1 spray daily.  9. Hydrochlorothiazide 25 mg daily.  10. Claritin 10 mg daily.  11. Singulair 10 mg daily.  12. Proventil 2 puffs 4 times a day as needed.  13. Qvar 80 mcg daily.   ALLERGIES: BUDESONIDE AND SYMBICORT.    REVIEW OF SYSTEMS: CONSTITUTIONAL: Complains of fatigue and weakness.   EYES: No blurred vision.  EARS: No tinnitus, ear pain or hearing loss.   RESPIRATORY: Has cough, wheeze, shortness of breath.   CARDIOVASCULAR: No chest pain.   GASTROINTESTINAL: No nausea, vomiting, diarrhea.   ENDOCRINE: No polyuria, nocturia, thyroid problems.   HEMATOLOGIC AND LYMPHATIC: No anemia, easy bruising.  INTEGUMENTARY: No acne, rash, lesion.   MUSCULOSKELETAL: No back pain, arthritis.   NEUROLOGIC: No focal numbness, weakness.  PSYCHIATRIC: No anxiety, depression.   PHYSICAL EXAMINATION: VITAL SIGNS: Temperature 97.9, pulse of 133, blood pressure 166/112, saturating 98% on BiPAP.   GENERAL: Obese, African-American female patient sitting at the side of the bed in significant respiratory distress on a BiPAP.   PSYCHIATRIC: Alert and oriented x 3, anxious.   HEENT: Atraumatic, normocephalic. Nasal mucosa moist and pink. External ears and nose normal. No pallor. No icterus. Pupils bilaterally equal and reactive to light.   NECK: Supple. No thyromegaly. No palpable lymph nodes. Trachea midline. No carotid bruit, JVD.   CARDIOVASCULAR: S1, S2, without any murmurs, tachycardic.   RESPIRATORY: Decreased air entry, expiratory wheezes, increased work of breathing using accessory muscles.   GASTROINESTINAL: Soft abdomen, nontender. Bowel sounds present. No organomegaly palpable.   SKIN: Warm and dry. No petechiae, rash, ulcers.   MUSCULOSKELETAL: No joint swelling, redness, effusion of the large joints. Normal muscle tone.   NEUROLOGICAL: Motor strength 5/5 in upper and lower extremities. Sensory intact all  over.   LYMPHATIC: No cervical lymphadenopathy.   IMAGING STUDIES: Chest x-ray shows COPD acute. EKG shows sinus tachycardia.   LABORATORY DATA: Glucose 116, BUN 10, creatinine 1.03. AST, ALT, alkaline phosphatase, bilirubin normal. Troponin less than 0.02. WBC 8.4, hemoglobin 14. ABG  shows pH of 7.34 with pCO2 of 62, pO2 of 123 on BiPAP.   ASSESSMENT AND PLAN: 1. Acute on chronic respiratory failure secondary to chronic obstructive pulmonary disease exacerbation. The patient will be admitted as inpatient, placed on BiPAP, put on scheduled IV steroids, nebulizers and antibiotics. The patient is critically ill. May need intubation. I have discussed this with the patient. She mentioned that she is a full code and would like to be on a ventilator. The patient was a DO NOT RESUSCITATE in a September admission, but she has reversed this to being full code. Further management as per her progress. The patient has left against medical advice on multiple locations as she felt a little better. I suspect this will be the case again, but I have requested her to stay in the hospital to complete her case, but the patient mentions that she would like to leave before the Super Bowl tomorrow, which is tomorrow evening.  2. Hypertension. Continue medications.  3. Sinus tachycardia secondary to respiratory distress.  4. Deep vein thrombosis prophylaxis with Lovenox.   CODE STATUS: Full code.   TOTAL TIME SPENT: On this patient was 40 minutes.    ____________________________ Molinda BailiffSrikar R. Arlando Leisinger, MD srs:TT D: 05/06/2014 16:12:53 ET T: 05/06/2014 16:27:42 ET JOB#: [id number]  cc: Wardell HeathSrikar R. Merrel Crabbe, MD, <Dictator> [facility] Orie FishermanSRIKAR R Ramata Strothman MD ELECTRONICALLY SIGNED 05/08/2014 3:57

## 2014-07-30 NOTE — Discharge Summary (Signed)
PATIENT NAME:  Julie Foley, Julie Foley MR#:  [id number] DATE OF BIRTH:  [birth year]  DATE OF ADMISSION:  05/06/2014 DATE OF DISCHARGE:  05/07/2014  ADMITTING DIAGNOSIS:  Chronic obstructive pulmonary disease exacerbation.  DISCHARGE DIAGNOSES:  1.  Acute on chronic respiratory failure with hypoxia and hypercapnia. 2.  Chronic obstructive pulmonary disease exacerbation.  3.  Acute bronchitis versus cocaine abuse related lung injury.  4.  Sinus tachycardia due to cocaine as well as respiratory failure.  5.  No myocardial infarction. 6.  Essential hypertension, chronic obstructive pulmonary disease, chronic respiratory failure, oxygen-dependent, on 4 liters of oxygen through nasal cannula, at home.  7.  Former tobacco abuser. 8.  Gastroesophageal reflux disease.   DISCHARGE CONDITION: Stable.   DISCHARGE MEDICATIONS: The patient is to resume loratadine 10 mg p.o. daily, Proventil 2 puffs 4 times daily as needed, alprazolam 0.5 mg 1-2 times daily as needed, Advair Diskus 500/50 one puff twice daily, DuoNeb 3 mL 4 times daily as needed, prednisone 50 mg p.o. daily, amlodipine 5 mg p.o. daily, Daliresp 500 mcg p.o. daily, enalapril 20 mg p.o. twice daily, famotidine 20 mg p.o. twice daily, fluticasone nasal spray 1-2 sprays to each nostril once daily, hydrochlorothiazide 25 mg p.o. daily, montelukast 10 mg p.o. in the evening, Qvar 1 puff twice daily, Levaquin 500 mg once daily for 6 more days. The patient is not to take azithromycin. Home oxygen at 4 liters of oxygen through nasal cannula.   DIET: 2-gram salt, low-fat, low-cholesterol, regular consistency.   ACTIVITY LIMITATIONS: As tolerated. The patient was advised not to use any cocaine anymore. The patient is not to snort or smoke because of concerns of the effect to her lungs.   FOLLOWUP APPOINTMENTS:  Primary care physician at [facility] in 2 days after discharge.   CONSULTANTS:  Care management, social work.  RADIOLOGIC STUDIES: Chest  x-ray, portable, single view, 05/06/2014 revealed no acute cardiopulmonary disease.   HISTORY OF PRESENT ILLNESS: The patient is a 59 year old female with past medical history significant for history of chronic respiratory failure, oxygen-dependent, who presents to the hospital with complaints of shortness of breath, as well as cough. Please refer to Dr. Eddie North  admission note on 05/06/2014.   On arrival to the hospital, the patient's vital signs, temperature was 97.9, pulse 133, blood pressure 166/112, saturation was 98% on BiPAP.  Physical exam revealed expiratory wheezes, increased work of breathing, using accessory muscles, and decreased air entry. Otherwise, physical examination was unremarkable.   LABORATORY DATA: Done on arrival to the hospital, 05/06/2014, showed elevated glucose level of 116 and CO2 level of 34; otherwise BMP was unremarkable. Liver enzymes were normal. Troponin was less than 0.02. Urine drug screen was positive for cocaine. Patient's CBC: White blood cell count was 8.4, hemoglobin was 14.1, platelet count was 361,000. Blood cultures taken on 05/06/2014 showed no growth. ABGs were performed on 40% FiO2, pH was 7.34, pCO2 was 62, pO2 was 123, saturation was 99%. Lactic acid level was 1.6.   HOSPITAL COURSE: The patient was admitted to the hospital for further evaluation. She was started on broad-spectrum antibiotic therapy and Solu-Medrol inhalation, and nebulizers. With this, her condition improved and by 05/07/2014, she was ready to get out from the hospital, requested to be discharged. Physical exam sounded better.  Her lungs were much improved. She had a few minimal wheezing and her lung opening was much better overall.  Her oxygenation remained stable and she is okay to discharge home. She is to  continue antibiotic therapy as well as inhalation therapy and nebulizers. On the day of discharge, her vital signs, temperature was 97.9, pulse was 102, respiration rate was 18, blood  pressure 106/71, saturation was 96% on 4 liters of oxygen per nasal cannula at rest and 98% on noninvasive  oxygenation with exertion.   TIME SPENT: 40 minutes.    ____________________________ Katharina Caperima Laverle Pillard, MD rv:LT D: 05/07/2014 15:04:00 ET T: 05/07/2014 16:56:12 ET JOB#: [id number]  cc: [facility] Katharina Caperima Demari Gales, MD, <Dictator>   Joshua Soulier MD ELECTRONICALLY SIGNED 05/23/2014 22:09

## 2014-07-30 NOTE — H&P (Signed)
PATIENT NAME:  Julie Foley, Julie Foley MR#:  [id number] DATE OF BIRTH:  01/10/1956  DATE OF ADMISSION:  06/12/2014  REFERRING PHYSICIAN:  Rebecka Apley, MD   PRIMARY CARE PHYSICIAN:  Nonlocal   ADMITTING DIAGNOSIS:  Acute-on-chronic respiratory failure with hypoxemia and hypercarbia.   HISTORY OF PRESENT ILLNESS:  This is a 59 year old African-American female who presents to the Emergency Department complaining of shortness of breath. The patient was seen in the Emergency Department for the same 3 days ago. She states that she had improved after that Emergency Department visit but gradually felt her chest becoming tighter over the last few days. She states she tried to sit outside on the porch hoping that the cool air would help her breathing, but that she rapidly became short of breath with audible wheezing over the course of approximately 3 hours tonight. She denies any recent illness or upper respiratory infection. She denies any fevers, nausea, vomiting, or chest pain. In the Emergency Department, the patient was found to have a venous carbon dioxide of 89 as well as some mild hypoxemia, which prompted the Emergency Department to call for admission.   REVIEW OF SYSTEMS: CONSTITUTIONAL:  The patient denies fever or weakness.  EYES:  Denies blurry vision or inflammation.  EARS, NOSE, AND THROAT:  Denies tinnitus or sore throat.  RESPIRATORY:  Admits to shortness of breath, but denies cough.  CARDIOVASCULAR:  Denies chest pain, palpitations, paroxysmal nocturnal dyspnea, or orthopnea.  GASTROINTESTINAL:  Denies nausea, vomiting, diarrhea, or abdominal pain.  ENDOCRINE:  Denies polyuria or polydipsia.  GENITOURINARY:  Denies dysuria or increased frequency or hesitancy of urination.  INTEGUMENTARY:  Denies rashes or lesions.  HEMATOLOGIC AND LYMPHATIC:  Denies easy bruising or bleeding.  MUSCULOSKELETAL:  Denies arthralgias or myalgias.  NEUROLOGIC:  Denies numbness in her extremities or  dysarthria.  PSYCHIATRIC:  Denies depression or suicidal ideation.   PAST MEDICAL HISTORY: COPD, hypertension, gastroesophageal reflux disease.   PAST SURGICAL HISTORY:  The patient has had no surgeries.   SOCIAL HISTORY:  The patient denies drinking or illicit drug use. She has quit smoking and also no longer experiments with illegal drugs. She is exposed to smoke in her home, however.   FAMILY HISTORY:  The patient has high blood pressure throughout various members of her family.   MEDICATIONS: 1.  Advair Diskus 500 mcg/50 mcg inhalation 1 puff 2 times a day.  2.  Albuterol 2.5 mg/3 mL nebulized solution 1 nebulizer treatment inhaled every 4 hours as needed for coughing, wheezing, or shortness of breath.  3.  Alprazolam 0.25 mg 1 tablet p.o. t.i.d. as needed for anxiety.  4.  Amlodipine 5 mg 1 tablet p.o. daily.  5.  Azithromycin 250 mg 1 tablet p.o. once a day.  6.  Daliresp 500 mcg 1 tablet p.o. daily.  7.  DuoNebs 2.5 mg/0.5 mg/3 mL nebulized solution 1 nebulizer treatment every 4 hours as needed for shortness of breath.  8.  Enalapril 20 mg 1 tablet p.o. b.i.d.  9.  Famotidine 20 mg 1 tablet p.o. b.i.d.  10.  Hydrochlorothiazide 25 mg 1 tablet p.o. daily.  11.  Ipratropium 500 mg/2.5 mL inhaled solution 1 nebulizer treatment 4 times a day as needed for shortness of breath.  12.  Loratadine 10 mg 1 tablet p.o. daily.  13.  Montelukast 10 mg 1 tablet p.o. daily.  14.  Nitrostat 0.4 mg 1 tablet sublingually every 5 minutes as needed for chest pain.  15.  Proventil high-flow inhaler  90 mcg/inhalation 1 puff inhaled every 4 hours as needed for coughing, wheezing, or shortness of breath.  16.  Qvar 80 mcg/inhalation 1 puff inhaled 2 times per day.  17.  Spiriva 18 mcg 1 capsule inhaled 1 time per day.   ALLERGIES:  BUDESONIDE AND FORMOTEROL, SYMBICORT, PENICILLIN.   PERTINENT LABORATORY RESULTS AND RADIOGRAPHIC FINDINGS:  Serum glucose is 142, BUN 15, creatinine 0.73, serum sodium  140, potassium 4, chloride 98, bicarbonate 34, calcium 9.3, serum albumin 4.9, alkaline phosphatase 52, AST 22, and ALT is 24. Troponin is less than 0.03. White blood cell count is 9.7, hemoglobin 13.4, hematocrit 42.1, and platelet count 101,000. Venous blood gas shows a pH of 7.24 with a pCO2 of 89%.   Chest x-ray shows no active disease.   PHYSICAL EXAMINATION: VITAL SIGNS:  Temperature is 98.6, pulse 116, respirations 20, blood pressure 125/72, and pulse oximetry is 96% on 20% FiO2 via BiPAP.  GENERAL:  The patient is alert and oriented x 3. She is in mild respiratory distress, as she is sitting upright in bed, leaning over in a tripod position.  HEENT:  Normocephalic, atraumatic. Pupils are equal, round, and reactive to light and accommodation. Extraocular movements are intact. The patient's eyes are watering, as she states is commonly the case when she is leaning over in respiratory distress. Mucous membranes are moist.  NECK:  Trachea is midline. No adenopathy. Thyroid is nonpalpable and nontender.  CHEST:  Symmetric and atraumatic.  CARDIOVASCULAR:  Tachycardic rate, normal rhythm. Normal S1, S2. No rubs, clicks, or murmurs appreciated. LUNGS:  There is faint wheezing throughout all lung fields. There is very minimal air movement. There are no rales or rhonchi. The patient is wearing a BiPAP mask.  ABDOMEN:  Positive bowel sounds. Soft, nontender, nondistended. No hepatosplenomegaly.  GENITOURINARY:  Deferred.  MUSCULOSKELETAL:  The patient moves all 4 extremities equally, but I have not tested her gait.  SKIN:  Warm and dry. No rashes or lesions.  EXTREMITIES:  No clubbing, cyanosis, or edema.  NEUROLOGIC:  Cranial nerves II through XII are grossly intact.  PSYCHIATRIC:  Mood is normal. Affect is congruent. The patient has excellent insight and judgment into her medical condition.   ASSESSMENT AND PLAN:   1.  This is a 59 year old female with acute-on-chronic respiratory failure with  hypoxemia and hypercarbia. This is likely an exacerbation of the patient's chronic obstructive pulmonary disease. She is currently on BiPAP. We are trying to improve the patient's ventilation so that her trapped carbon dioxide and acidosis can improve. At this time, she is compensating relatively well. She received Solu-Medrol, magnesium, 3 DuoNebs, and 2 albuterol nebulizer treatments already. She reportedly has more air movement at this juncture than she had upon arrival. We will continue the patient's Advair, Spiriva, and Daliresp. She was given azithromycin in the Emergency Department, and we will continue these antibiotics. I have also written for her to have a long steroid taper.  2.  Hypertension. We will continue enalapril and amlodipine.  3.  Gastroesophageal reflux disease. We will give an H2-blocker as needed.  4.  Overweight. The patient's body mass index is 30.2. I have encouraged a balanced diet and pulmonary rehabilitation.  5.  Deep vein thrombosis prophylaxis. Heparin.  6.  Gastrointestinal prophylaxis. None, as the patient is not critically ill.   CODE STATUS:  The patient is a full code.   TIME SPENT ON ADMISSION ORDERS AND PATIENT CARE:  Approximately 35 minutes.    ____________________________ Kelton Pillar.  Sheryle Hailiamond, MD msd:nb D: 06/12/2014 05:16:24 ET T: 06/12/2014 05:43:04 ET JOB#: [id number]  cc: Kelton PillarMichael S. Sheryle Hailiamond, MD, <Dictator> Kelton PillarMICHAEL S Khiya Friese MD ELECTRONICALLY SIGNED 06/13/2014 0:05

## 2014-07-30 NOTE — Discharge Summary (Signed)
PATIENT NAME:  Julie Foley, Julie Foley MR#:  [id number] DATE OF BIRTH:  04/27/1955  DATE OF ADMISSION:  06/12/2014 DATE OF DISCHARGE:  06/13/2014  DISCHARGE DIAGNOSIS: Acute on chronic respiratory failure with hypoxemia and hypercarbia, likely due to chronic obstructive pulmonary disease exacerbation.   SECONDARY DIAGNOSES:  COPD, hypertension, gastroesophageal reflux disease.  CONSULTATIONS: None.   PROCEDURES AND RADIOLOGY: Chest x-ray on 14th of March showed no acute cardiopulmonary disease.   HISTORY AND SHORT HOSPITAL COURSE: The patient is a 59 year old female with the above-mentioned medical problem who was admitted for acute on chronic respiratory failure with hypercarbia and hypoxemia thought to be due to COPD exacerbation. Please see Dr. Casimiro Needle Diamond's dictated history and physical for further details. The patient was started on IV steroids, nebulizer breathing treatment along with empiric antibiotic. Was slowly responding to the above regimen, was feeling much better, was quite back to her baseline by 15th of March. She normally uses 4 liters oxygen via nasal cannula at home chronically and that is where her oxygen needs were on 15th of March. The patient was very adamant wanting to go home and is being discharged in stable condition.   PERTINENT DISCHARGE PHYSICAL EXAMINATION: VITAL SIGNS: On the date of discharge, temperature was 98.3, heart rate 96 per minute, respirations 18 per minute, blood pressure 114/73 and she is saturating 95% on 4 liters oxygen via nasal cannula.  CARDIOVASCULAR: S1, S2 normal. No murmurs, rubs, or gallops.  LUNGS: Clear to auscultation bilaterally. No wheezing, rales, rhonchi, crepitation.  ABDOMEN: Soft, benign.  NEUROLOGIC: Nonfocal examination. All other physical examination remained at baseline.   DISCHARGE MEDICATIONS:  Medication Instructions  advair diskus 500 mcg-50 mcg inhalation powder  1 puff(s) inhaled 2 times a day   enalapril 20 mg oral  tablet  1 tab(s) orally 2 times a day   famotidine 20 mg oral tablet  1 tab(s) orally 2 times a day   qvar 80 mcg/inh inhalation aerosol  1 puff(s) inhaled 2 times a day   duoneb 2.5 mg-0.5 mg/3 ml inhalation solution  3 milliliter(s) inhaled every 4 hours, As Needed - for Shortness of Breath   proventil hfa cfc free 90 mcg/inh inhalation aerosol  1 puff(s) inhaled every 4 hours, As Needed - for Shortness of Breath   albuterol 2.5 mg/3 ml (0.083%) inhalation solution  3 milliliter(s) inhaled every 4 hours, As Needed - for Shortness of Breath   nitrostat 0.4 mg sublingual tablet  1 tab(s) sublingual every 5 minutes, As Needed - for Chest Pain   amlodipine 5 mg oral tablet  1 tab(s) orally once a day   daliresp 500 mcg oral tablet  1 tab(s) orally once a day   hydrochlorothiazide 25 mg oral tablet  1 tab(s) orally once a day   loratadine 10 mg oral tablet  1 tab(s) orally once a day   montelukast 10 mg oral tablet  1 tab(s) orally once a day   spiriva 18 mcg inhalation capsule  1 cap(s) inhaled once a day   ipratropium 500 mcg/2.5 ml inhalation solution  2.5 milliliter(s) inhaled 4 times a day, As Needed - for Shortness of Breath   alprazolam 0.25 mg oral tablet  1 tab(s) orally 3 times a day   azithromycin 250 mg oral tablet  1 tab(s) orally once a day x 3 days   prednisone 10 mg oral tablet  Start at 60 mg and taper by 10 mg daily until complete      DISCHARGE DIET:  Regular.  DISCHARGE ACTIVITY: As tolerated.   DISCHARGE INSTRUCTIONS AND FOLLOW-UP: The patient was instructed to follow up with her [facility] in 1 to 2 weeks. She will need follow[facility]. She was also instructed to see [facility]ocrinology at [facility] in 2 to 3 weeks for thyroid evaluation. The patient was also given an option to see Dr. Ned ClinesHerbon Fleming from pulmonary [facility] at [facility] if she prefers local pulmonary physician as she was asking for same.  She will continue her 4 liters of oxygen via nasal canula which she uses at home for her chronic oxygen requirement. She remains at high risk for readmission.  TIME SPENT ON DISCHARGE: 45 minutes.  ____________________________ Ellamae SiaVipul S. Sherryll BurgerShah, MD vss:sb D: 06/13/2014 13:19:04 ET T: 06/13/2014 13:38:31 ET JOB#: [id number]  cc: Joshua Zeringue S. Sherryll BurgerShah, MD, <Dictator> PCP at [facility] Herbon E. Meredeth IdeFleming, MD Ellamae SiaVIPUL S [facility] MD ELECTRONICALLY SIGNED 06/22/2014 12:04

## 2014-07-31 NOTE — Discharge Summary (Signed)
PATIENT NAME:  Julie Foley, Julie Foley MR#:  [id number] DATE OF BIRTH:  06-12-1955  DATE OF ADMISSION:  07/28/2014 DATE OF DISCHARGE:  07/28/2014  ADMISSION DIAGNOSIS:  Acute chronic obstructive pulmonary disease exacerbation.    DISCHARGE DIAGNOSES: 1. Acute on chronic respiratory failure from acute chronic obstructive pulmonary disease exacerbation.  2. Acute chronic obstructive pulmonary disease exacerbation.   DISCHARGE PHYSICAL EXAMINATION: VITAL SIGNS: Temperature 98.4, pulse is 100.  Blood pressure 112/74, respirations 20, and 93% on 3 liters.  LUNGS: Clear to auscultation. There are no crackles or rales, rhonchi, or wheezing. Good air flow.  ABDOMEN: Bowel sounds positive, nontender, nondistended, no hepatosplenomegaly.  CARDIOVASCULAR: Regular rate and rhythm. No murmurs, gallops, or rubs.   HOSPITAL COURSE: This is a 59 year old female with a history of COPD with chronic respiratory failure on chronic oxygen who presented with shortness of breath and wheezing. For further details, please refer to the H and P.  1. Acute on chronic respiratory failure. The patient was admitted with COPD exacerbation as outlined below.  2. Acute COPD exacerbation. The patient was admitted with wheezing.  Her chest x-ray showed no evidence of a pneumonia. She was treated with IV steroids, continued on oxygen,  inhalers, and nebulizer.  She was ambulating on her baseline oxygen. Her heart rate was not elevated and she was not hypoxic. She was walking comfortably on exertion, so she was discharged home.  3. History of congestive heart failure, which was stable, no evidence of CHF on chest x-ray.  4. History of hypertension. The patient's blood pressure was  adequately controlled. The patient will resume her outpatient medications.   DISCHARGE MEDICATIONS: 1. Advair Diskus 500/50 b.i.d.  2. Famotidine 20 mg b.i.d.   3. Qvar 80 mcg 1 puff b.i.d.  4. Proventil 1 puff every 4 hours p.r.n.  5. Albuterol inhaler 3 mL  inhaled q. 4 hours p.r.n.  6. Norvasc 5 mg daily.  7. Daliresp 500 mcg daily.  8. Hydrochlorothiazide 25 mg daily.  9. Loratadine 10 mg daily.  10. Montelukast 10 mg daily.  11. Spiriva 18 mcg daily.  12. Enalapril 20 mg b.i.d.  13. Nitroglycerin sublingual p.r.n. chest pain.  14. Ipratropium 4 times a day p.r.n.  15. DuoNebs 4 times a day p.r.n.  16. Fluticasone 50 mcg b.i.d.  17. Azithromycin 250 daily for 3 days.  18. Prednisone taper starting at 60 mg, taper x 10 mg every 2 days.  DISCHARGE OXYGEN: Four liters nasal cannula.   DISCHARGE DIET: Low sodium.   DISCHARGE ACTIVITY: As tolerated.   DISCHARGE FOLLOW-UP:  The patient will need to followup with her PCP.   TIME SPENT: Approximately 35 minutes.   The patient was stable for discharge.    ____________________________ Merryn Thaker P. Juliene PinaMody, MD spm:tr D: 07/28/2014 12:35:17 ET T: 07/28/2014 17:32:29 ET JOB#: [id number]  cc: Alexiss Iturralde P. Juliene PinaMody, MD, <Dictator> Evon Lopezperez P Brittiny Levitz MD ELECTRONICALLY SIGNED 07/28/2014 21:00

## 2014-08-11 ENCOUNTER — Emergency Department: Payer: Medicaid Other

## 2014-08-11 ENCOUNTER — Inpatient Hospital Stay
Admission: EM | Admit: 2014-08-11 | Discharge: 2014-08-13 | DRG: 190 | Disposition: A | Discharge status: Home / Self Care | Payer: Medicaid Other | Attending: Internal Medicine | Admitting: Internal Medicine

## 2014-08-11 ENCOUNTER — Encounter: Payer: Self-pay | Admitting: Emergency Medicine

## 2014-08-11 DIAGNOSIS — Z9109 Other allergy status, other than to drugs and biological substances: Secondary | ICD-10-CM | POA: Diagnosis not present

## 2014-08-11 DIAGNOSIS — Z87891 Personal history of nicotine dependence: Secondary | ICD-10-CM | POA: Diagnosis not present

## 2014-08-11 DIAGNOSIS — J9620 Acute and chronic respiratory failure, unspecified whether with hypoxia or hypercapnia: Secondary | ICD-10-CM | POA: Diagnosis present

## 2014-08-11 DIAGNOSIS — I509 Heart failure, unspecified: Secondary | ICD-10-CM | POA: Diagnosis present

## 2014-08-11 DIAGNOSIS — Z9981 Dependence on supplemental oxygen: Secondary | ICD-10-CM | POA: Diagnosis not present

## 2014-08-11 DIAGNOSIS — J441 Chronic obstructive pulmonary disease with (acute) exacerbation: Principal | ICD-10-CM | POA: Diagnosis present

## 2014-08-11 DIAGNOSIS — I10 Essential (primary) hypertension: Secondary | ICD-10-CM | POA: Diagnosis present

## 2014-08-11 DIAGNOSIS — Z809 Family history of malignant neoplasm, unspecified: Secondary | ICD-10-CM | POA: Diagnosis not present

## 2014-08-11 DIAGNOSIS — Z7951 Long term (current) use of inhaled steroids: Secondary | ICD-10-CM

## 2014-08-11 DIAGNOSIS — Z79899 Other long term (current) drug therapy: Secondary | ICD-10-CM | POA: Diagnosis not present

## 2014-08-11 DIAGNOSIS — K219 Gastro-esophageal reflux disease without esophagitis: Secondary | ICD-10-CM | POA: Diagnosis present

## 2014-08-11 HISTORY — DX: Heart failure, unspecified: I50.9

## 2014-08-11 HISTORY — DX: Chronic obstructive pulmonary disease, unspecified: J44.9

## 2014-08-11 LAB — CBC
HCT: 39.4 % (ref 35.0–47.0)
Hemoglobin: 13 g/dL (ref 12.0–16.0)
MCH: 33.3 pg (ref 26.0–34.0)
MCHC: 33 g/dL (ref 32.0–36.0)
MCV: 100.7 fL — AB (ref 80.0–100.0)
Platelets: 326 10*3/uL (ref 150–440)
RBC: 3.91 MIL/uL (ref 3.80–5.20)
RDW: 13.6 % (ref 11.5–14.5)
WBC: 9.6 10*3/uL (ref 3.6–11.0)

## 2014-08-11 LAB — BLOOD GAS, ARTERIAL
Acid-Base Excess: 2.4 mmol/L (ref 0.0–3.0)
Allens test (pass/fail): POSITIVE — AB
Bicarbonate: 30.2 mEq/L — ABNORMAL HIGH (ref 21.0–28.0)
DELIVERY SYSTEMS: POSITIVE
Expiratory PAP: 6
FIO2: 0.3 %
Inspiratory PAP: 16
O2 SAT: 93.7 %
PATIENT TEMPERATURE: 37
RATE: 12 resp/min
pCO2 arterial: 60 mmHg — ABNORMAL HIGH (ref 32.0–48.0)
pH, Arterial: 7.31 — ABNORMAL LOW (ref 7.350–7.450)
pO2, Arterial: 76 mmHg — ABNORMAL LOW (ref 83.0–108.0)

## 2014-08-11 LAB — TROPONIN I: Troponin I: <0.03 ng/mL (ref <0.031)

## 2014-08-11 MED ORDER — CETYLPYRIDINIUM CHLORIDE 0.05 % MT LIQD
7.0000 mL | Freq: Two times a day (BID) | OROMUCOSAL | Status: DC
  Administered 2014-08-12: 7 mL via OROMUCOSAL

## 2014-08-11 MED ORDER — TIOTROPIUM BROMIDE MONOHYDRATE 18 MCG IN CAPS
18.0000 ug | ORAL_CAPSULE | Freq: Every day | RESPIRATORY_TRACT | Status: DC
  Administered 2014-08-12 – 2014-08-13 (×2): 18 ug via RESPIRATORY_TRACT
  Filled 2014-08-11: qty 5

## 2014-08-11 MED ORDER — ALBUTEROL SULFATE (2.5 MG/3ML) 0.083% IN NEBU
INHALATION_SOLUTION | RESPIRATORY_TRACT | Status: AC
  Administered 2014-08-11: 7.5 mg
  Filled 2014-08-11: qty 9

## 2014-08-11 MED ORDER — BUDESONIDE 0.25 MG/2ML IN SUSP
2.0000 mL | Freq: Two times a day (BID) | RESPIRATORY_TRACT | Status: DC
  Filled 2014-08-11 (×2): qty 2

## 2014-08-11 MED ORDER — METOPROLOL TARTRATE 1 MG/ML IV SOLN
5.0000 mg | INTRAVENOUS | Status: DC | PRN
  Administered 2014-08-11: 5 mg via INTRAVENOUS
  Filled 2014-08-11: qty 5

## 2014-08-11 MED ORDER — IPRATROPIUM-ALBUTEROL 0.5-2.5 (3) MG/3ML IN SOLN
3.0000 mL | RESPIRATORY_TRACT | Status: DC
  Administered 2014-08-11 – 2014-08-13 (×11): 3 mL via RESPIRATORY_TRACT
  Filled 2014-08-11 (×11): qty 3

## 2014-08-11 MED ORDER — SENNOSIDES-DOCUSATE SODIUM 8.6-50 MG PO TABS: 1.0000 | ORAL_TABLET | Freq: Every evening | ORAL | Status: DC | PRN

## 2014-08-11 MED ORDER — ENALAPRIL MALEATE 10 MG PO TABS
20.0000 mg | ORAL_TABLET | Freq: Two times a day (BID) | ORAL | Status: DC
  Administered 2014-08-11 – 2014-08-13 (×4): 20 mg via ORAL
  Filled 2014-08-11 (×4): qty 2

## 2014-08-11 MED ORDER — HEPARIN SODIUM (PORCINE) 5000 UNIT/ML IJ SOLN
5000.0000 [IU] | Freq: Three times a day (TID) | INTRAMUSCULAR | Status: DC
  Administered 2014-08-11 – 2014-08-13 (×5): 5000 [IU] via SUBCUTANEOUS
  Filled 2014-08-11 (×5): qty 1

## 2014-08-11 MED ORDER — LORAZEPAM 2 MG/ML IJ SOLN
0.5000 mg | Freq: Once | INTRAMUSCULAR | Status: AC
  Administered 2014-08-11: 0.5 mg via INTRAVENOUS

## 2014-08-11 MED ORDER — AMLODIPINE BESYLATE 5 MG PO TABS
5.0000 mg | ORAL_TABLET | Freq: Every day | ORAL | Status: DC
  Administered 2014-08-12 – 2014-08-13 (×2): 5 mg via ORAL
  Filled 2014-08-11 (×3): qty 1

## 2014-08-11 MED ORDER — ONDANSETRON HCL 4 MG PO TABS: 4.0000 mg | ORAL_TABLET | Freq: Four times a day (QID) | ORAL | Status: DC | PRN

## 2014-08-11 MED ORDER — LEVALBUTEROL HCL 1.25 MG/0.5ML IN NEBU
1.2500 mg | INHALATION_SOLUTION | Freq: Four times a day (QID) | RESPIRATORY_TRACT | Status: DC | PRN
  Administered 2014-08-11: 1.25 mg via RESPIRATORY_TRACT
  Filled 2014-08-11: qty 0.5

## 2014-08-11 MED ORDER — CARVEDILOL 12.5 MG PO TABS
12.5000 mg | ORAL_TABLET | Freq: Two times a day (BID) | ORAL | Status: DC
  Administered 2014-08-12 (×2): 12.5 mg via ORAL
  Filled 2014-08-11 (×2): qty 1

## 2014-08-11 MED ORDER — ACETAMINOPHEN 325 MG PO TABS: 650.0000 mg | ORAL_TABLET | Freq: Four times a day (QID) | ORAL | Status: DC | PRN

## 2014-08-11 MED ORDER — ROFLUMILAST 500 MCG PO TABS
500.0000 ug | ORAL_TABLET | Freq: Every day | ORAL | Status: DC
  Administered 2014-08-11 – 2014-08-13 (×3): 500 ug via ORAL
  Filled 2014-08-11 (×3): qty 1

## 2014-08-11 MED ORDER — MOMETASONE FURO-FORMOTEROL FUM 100-5 MCG/ACT IN AERO
2.0000 | INHALATION_SPRAY | Freq: Two times a day (BID) | RESPIRATORY_TRACT | Status: DC
  Administered 2014-08-12 – 2014-08-13 (×3): 2 via RESPIRATORY_TRACT
  Filled 2014-08-11: qty 8.8

## 2014-08-11 MED ORDER — LORAZEPAM 2 MG/ML IJ SOLN
INTRAMUSCULAR | Status: AC
  Administered 2014-08-11: 0.5 mg via INTRAVENOUS
  Filled 2014-08-11: qty 1

## 2014-08-11 MED ORDER — FAMOTIDINE 20 MG PO TABS
20.0000 mg | ORAL_TABLET | Freq: Two times a day (BID) | ORAL | Status: DC
Start: 2014-08-11 — End: 2014-08-13
  Administered 2014-08-11 – 2014-08-13 (×4): 20 mg via ORAL
  Filled 2014-08-11 (×4): qty 1

## 2014-08-11 MED ORDER — METHYLPREDNISOLONE SODIUM SUCC 125 MG IJ SOLR
60.0000 mg | Freq: Three times a day (TID) | INTRAMUSCULAR | Status: DC
  Administered 2014-08-11 – 2014-08-12 (×2): 60 mg via INTRAVENOUS
  Filled 2014-08-11 (×2): qty 2

## 2014-08-11 MED ORDER — SODIUM CHLORIDE 0.9 % IJ SOLN: 3.0000 mL | Freq: Two times a day (BID) | INTRAMUSCULAR | Status: DC

## 2014-08-11 MED ORDER — BUDESONIDE 0.5 MG/2ML IN SUSP
RESPIRATORY_TRACT | Status: AC
  Administered 2014-08-11: 0.5 mg via RESPIRATORY_TRACT
  Filled 2014-08-11: qty 2

## 2014-08-11 MED ORDER — SODIUM CHLORIDE 0.9 % IJ SOLN: 3.0000 mL | INTRAMUSCULAR | Status: DC | PRN

## 2014-08-11 MED ORDER — DEXTROSE 5 % IV SOLN
500.0000 mg | INTRAVENOUS | Status: DC
  Administered 2014-08-11 – 2014-08-12 (×2): 500 mg via INTRAVENOUS
  Filled 2014-08-11 (×3): qty 500

## 2014-08-11 MED ORDER — ALBUTEROL SULFATE (2.5 MG/3ML) 0.083% IN NEBU: 7.5000 mg | INHALATION_SOLUTION | RESPIRATORY_TRACT | Status: AC

## 2014-08-11 MED ORDER — CHLORHEXIDINE GLUCONATE 0.12 % MT SOLN: 15.0000 mL | Freq: Two times a day (BID) | OROMUCOSAL | Status: DC

## 2014-08-11 MED ORDER — ALPRAZOLAM 0.25 MG PO TABS
0.2500 mg | ORAL_TABLET | Freq: Two times a day (BID) | ORAL | Status: DC | PRN
  Administered 2014-08-11 – 2014-08-13 (×4): 0.25 mg via ORAL
  Filled 2014-08-11 (×4): qty 1

## 2014-08-11 MED ORDER — ONDANSETRON HCL 4 MG/2ML IJ SOLN: 4.0000 mg | Freq: Four times a day (QID) | INTRAMUSCULAR | Status: DC | PRN

## 2014-08-11 MED ORDER — ACETAMINOPHEN 650 MG RE SUPP: 650.0000 mg | Freq: Four times a day (QID) | RECTAL | Status: DC | PRN

## 2014-08-11 MED ORDER — BUDESONIDE 0.5 MG/2ML IN SUSP
0.5000 mg | Freq: Two times a day (BID) | RESPIRATORY_TRACT | Status: DC
  Administered 2014-08-11 – 2014-08-13 (×4): 0.5 mg via RESPIRATORY_TRACT
  Filled 2014-08-11 (×3): qty 2

## 2014-08-11 NOTE — ED Notes (Signed)
Pt arrived from home via EMS. Pt presents with veni-mask in place with nebulizer tx. Pt received 3 duonebs and 125 g solummedrol.Pt is alert and oriented.

## 2014-08-11 NOTE — Progress Notes (Signed)
Voicemail left on daughter, Sameeka's phone to call back to hospital. Pt wanted to let daughters know what room she is in. Attempt to call [place], but voicemail box not set up and thus unable to leave voicemessage.

## 2014-08-11 NOTE — Progress Notes (Signed)
Patient with mild respiratory distress. Patient has requested to come off bipap so that she can eat. svn given as ordered. Faint expiratory wheezes noted. Patient tolerating 3liter nasal cannula well. Will place back on bipap after eating

## 2014-08-11 NOTE — H&P (Signed)
[facility] [place] at [facility]   PATIENT NAME: Julie KnockSylvia Hoadley    MR#:  [id number]  DATE OF BIRTH:  04/28/1955  DATE OF ADMISSION:  08/11/2014  PRIMARY CARE PHYSICIAN: No PCP Per Patient   REQUESTING/REFERRING PHYSICIAN: Dr. Fanny BienQuale  CHIEF COMPLAINT:  Shortness of breath and wheezing HISTORY OF PRESENT ILLNESS:  Julie Foley  is a 59 y.o. female with a known history of chronic respiratory failure on 3 L of oxygen due to COPD, hypertension and congestive heart failure who presents with above complaint. Patient says over the past day she's had increasing shortness of breath, wheezing, cough, and palpitations. She presented to the ER for further evaluation. In the emergency department her heart rate was in the 130s she appeared in respiratory distress and therefore a BiPAP machine was placed. Patient was recently hospitalized at the end of April for COPD, at that time and several other times to during her hospitalization she had requested to leave. Again today she is saying that she will want to leave within the next day. She has chest tightness, but note chest pain and fevers or chills. PAST MEDICAL HISTORY:   Past Medical History  Diagnosis Date  . COPD (chronic obstructive pulmonary disease)   . CHF (congestive heart failure)   HTN GERD  PAST SURGICAL HISTOIRY:  Tubal ligation  SOCIAL HISTORY:   History  Substance Use Topics  . Smoking status: Former Smoker -- 2.00 packs/day for 35 years    Types: Cigarettes  . Smokeless tobacco: Not on file  . Alcohol Use: Yes    FAMILY HISTORY:   Family History  Problem Relation Age of Onset  . Cancer Mother     DRUG ALLERGIES:  No Known Allergies  REVIEW OF SYSTEMS:  CONSTITUTIONAL: No fever, fatigue or weakness.  EYES: No blurred or double vision.  EARS, NOSE, AND THROAT: No tinnitus or ear pain.  RESPIRATORY: ++ cough, shortness of breath and wheezing NO hemoptysis.  CARDIOVASCULAR: No chest pain,  orthopnea, edema. + Chest tightness GASTROINTESTINAL: No nausea, vomiting, diarrhea or abdominal pain.  GENITOURINARY: No dysuria, hematuria.  ENDOCRINE: No polyuria, nocturia,  HEMATOLOGY: No anemia, easy bruising or bleeding SKIN: No rash or lesion. MUSCULOSKELETAL: No joint pain or arthritis.   NEUROLOGIC: No tingling, numbness, weakness.  PSYCHIATRY: No anxiety or depression.   MEDICATIONS AT HOME:   Prior to Admission medications   Advair 500/50 BID Albuterol INH soln every 4 hours as needed for shortness of breath Norvasc 5 mg daily Daliresp 500 mcg daily  DuoNeb nebs every 4 hours when necessary  Enalapril 20 mg twice a day  Famotidine 20 mg twice a day fluticasone inhalation 1 puff twice a day  HCTZ 25 mg daily  ipratropium 4 times a day as needed  Loratadine 10 mg daily Singular 10 mg daily Nitroglycerin subungual chest pain Proventil 1 puff every 4 hours when necessary Qvar 80 g twice a day   Spiriva 18 migraines daily        VITAL SIGNS:  Blood pressure 143/104, pulse 133, resp. rate 20, height 5\' 5"  (1.651 m), weight 79.833 kg (176 lb), SpO2 98 %.  PHYSICAL EXAMINATION:  GENERAL:  59 y.o.-year-old patient lying in the bed + acute respiratory  distress.  EYES: Pupils equal, round, reactive to light and accommodation. No scleral icterus. Extraocular muscles intact.  HEENT: Head atraumatic, normocephalicOropharynx was not inspected patient has BiPAP placed Neck :  Supple, no jugular venous distention. No thyroid enlargement, no  tenderness.  LUNGS:No use of  accessory muscles + wheezing fair air movement  CARDIOVASCULAR:  tachycardia . No murmurs, rubs, or gallops.  ABDOMEN: Soft, nontender, nondistended. Bowel sounds present. No organomegaly or mass.  EXTREMITIES: No pedal edema, cyanosis, or clubbing.  NEUROLOGIC: Cranial nerves II through XII are intact. Muscle strength 5/5 in all extremities. Sensation intact. Gait not checked.  PSYCHIATRIC: The patient is  alert and oriented x 3. +++ anxious  SKIN: No obvious rash, lesion, or ulcer.   LABORATORY PANEL:   CBC  Recent Labs Lab 08/11/14 1406  WBC 9.6  HGB 13.0  HCT 39.4  PLT 326   ------------------------------------------------------------------------------------------------------------------  Chemistries  No results for input(s): NA, K, CL, CO2, GLUCOSE, BUN, CREATININE, CALCIUM, MG, AST, ALT, ALKPHOS, BILITOT in the last 168 hours.  Invalid input(s): GFRCGP ------------------------------------------------------------------------------------------------------------------  Cardiac Enzymes  Recent Labs Lab 08/11/14 1406  TROPONINI <0.03   ------------------------------------------------------------------------------------------------------------------  RADIOLOGY:  Dg Chest Port 1 View  08/11/2014     IMPRESSION: Underlying emphysematous change.  No edema or consolidation.   Electronically Signed   By: Bretta BangWilliam  Woodruff III M.D.   On: 08/11/2014 14:52    EKG:  Sinus tachycardia with heart rate 129 no acute ST elevation or depression Orders placed or performed during the hospital encounter of 08/11/14  . EKG 12-Lead  . EKG 12-Lead  . ED EKG  (if patient has PMH of COPD)  . ED EKG  (if patient has PMH of COPD)  . ED EKG  . ED EKG    IMPRESSION AND PLAN:  This is a 59 year old female with a history of chronic respiratory failure on 3 L of oxygen due to COPD, congestive heart failure, and hypertension who presents with acute and chronic respiratory failure.  1. Acute on chronic respiratory failure due to COPD exacerbation: Admit to telemetry due to her elevated heart rate. She is currently on a BiPAP machine which I will continue. I will check an ABG in 4 hours. She will require IV steroids, Nebs and continue her outpatient Inhalers. We will closely monitor patient for signs of decompensation.  2. Acute COPD exacerbation: I will continue management as stated above. I will  also add azithromycin for COPD exacerbation. BiPAP will be tapered off as tolerated. She would also benefit from Xanax deterring anxiety  3. Sinus tachycardia: Due to problem #1 and #2. Patient will be admitted to telemetry where we will carefully monitor heart rate. I've ordered metoprolol IV when necessary for her elevated heart rate. I will also start Coreg.  4. Essential hypertension: Patient will continue with enalapril and Norvasc. I've added Coreg to her regimen due to her tachycardia we will closely monitor her blood pressure..      All the records are reviewed and case discussed with ED provider. Management plans discussed with the patient and she is in agreement.  CODE STATUS: Full   CRITICAL CARE TOTAL TIME TAKING CARE OF THIS PATIENT: 55 minutes.    Parveen Freehling M.D on 08/11/2014 at 4:05 PM  Between 7am to 6pm - Pager - [phone] After 6pm go to [email or web address] - password [email or web address] [facility]  [facility]  Office  [phone]  CC: Primary care physician; No PCP Per Patient

## 2014-08-11 NOTE — ED Provider Notes (Addendum)
[facility] Emergency Department Provider Note  ____________________________________________  Time seen: Approximately 1:40 PM  I have reviewed the triage vital signs and the nursing notes.   HISTORY  Chief Complaint Wheezing    HPI Maryruth BunSylvia L Trampe is a 59 y.o. female presents today with "COPD attack". Patient states that she needs "BiPAP" for a couple of hours, but then wishes to go home. She denies chest pain states her chest feels slightly tight which is typical.  She denies cough. She is on 4 L nasal cannula at home.  She denies weakness, vomiting, fever, chills.  History of present illness is limited by dyspnea and acuity of condition.    Past Medical History  Diagnosis Date  . COPD (chronic obstructive pulmonary disease)   . CHF (congestive heart failure)     There are no active problems to display for this patient.   History reviewed. No pertinent past surgical history.  No current outpatient prescriptions on file.  Allergies Review of patient's allergies indicates no known allergies.  Family History  Problem Relation Age of Onset  . Cancer Mother     Social History History  Substance Use Topics  . Smoking status: Former Smoker -- 2.00 packs/day for 35 years    Types: Cigarettes  . Smokeless tobacco: Not on file  . Alcohol Use: Yes    Review of Systems Constitutional: No fever/chills  ENT: No sore throat. Cardiovascular: Denies chest pain. Respiratory: Denies shortness of breath. Gastrointestinal: No abdominal pain.  No nausea, no vomiting.  No diarrhea.  No constipation. Genitourinary: Negative for dysuria. Musculoskeletal: Negative for back pain. Neurological: Negative for headaches, focal weakness or numbness.  Review of systems is limited because of patient dyspnea.   ____________________________________________   PHYSICAL EXAM:  VITAL SIGNS: ED Triage Vitals  Enc Vitals Group     BP 08/11/14 1347 100/88  mmHg     Pulse Rate 08/11/14 1347 129     Resp 08/11/14 1434 35     Temp --      Temp src --      SpO2 08/11/14 1347 100 %     Weight 08/11/14 1347 176 lb (79.833 kg)     Height 08/11/14 1347 5\' 5"  (1.651 m)     Head Cir --      Peak Flow --      Pain Score --      Pain Loc --      Pain Edu? --      Excl. in GC? --     Constitutional: Alert and oriented. Well chested, sitting upright in bed, obvious use of accessory muscles and moderate to severe respiratory distress.  Eyes: Conjunctivae are normal. PERRL. EOMI. Head: Atraumatic. Nose: No congestion/rhinnorhea. Mouth/Throat: Mucous membranes are moist.  Oropharynx non-erythematous. Neck: No stridor.   Cardiovascular: Tachycardia regular rhythm. Grossly normal heart sounds.  Good peripheral circulation. Respiratory: Patient sitting straight up, but I'll chested, obvious increased work of breathing with moderate respiratory wheezing throughout, condition is speaking in words. Musculoskeletal: No lower extremity tenderness nor edema.  No joint effusions. Neurologic:  Normal speech and language. No gross focal neurologic deficits are appreciated. Speech is clear but limited to speaking in 1-2 words..  Skin:  Skin is warm, dry and intact. No rash noted. Psychiatric: Mood and affect are normal. Speech and behavior are normal.  ____________________________________________   LABS (all labs ordered are listed, but only abnormal results are displayed)  Labs Reviewed  CBC - Abnormal; Notable  for the following:    MCV 100.7 (*)    All other components within normal limits  TROPONIN I  BASIC METABOLIC PANEL   ____________________________________________  EKG  Pending ____________________________________________  RADIOLOGY  COPD. No acute consolidation. No pneumothorax. ____________________________________________   PROCEDURES  Procedure(s) performed: None  Critical Care performed: Yes, 35 minutes. Acute respiratory  distress requiring BiPAP. Admission to the hospital. Time is inclusive of evaluation the patient, discussed with the hospitalist, reviewing labs and radiology results and reevaluation.  ____________________________________________   INITIAL IMPRESSION / ASSESSMENT AND PLAN / ED COURSE  Pertinent labs & imaging results that were available during my care of the patient were reviewed by me and considered in my medical decision making (see chart for details).  Patient presenting with moderate respiratory distress. She has a long history of COPD. Unfortunately, the patient states that she would like to try BiPAP for a couple of hours and then wants to be discharged. I discussed the patient that this is a bad idea given her significant increased work of breathing. ____________________________________________   FINAL CLINICAL IMPRESSION(S) / ED DIAGNOSES  Final diagnoses:  None   COPD exacerbation initial, acute, with respiratory distress requiring BiPAP.   Sharyn CreamerMark Dorris Vangorder, MD 08/11/14 1547  ----------------------------------------- 3:47 PM on 08/11/2014 -----------------------------------------  The patient feels much improved at this time. She is doing well on BiPAP. Will admit her to the hospital, patient is now amenable to admission.  Sharyn CreamerMark Angelice Piech, MD 08/11/14 1548  Initial EKG at 1:44 PM sinus tachycardia with occasional PVC. Nonspecific T wave abnormality. Ventricular rate 120 QRS 72 PR 1:30 QTc 451   Second EKG at 1555 Sinus tachycardia nonspecific T-wave abnormality ventricularly 129 PR 124 QRS 72 QTC 545. No evidence of acute ST elevation.  Sharyn CreamerMark Quindarrius Joplin, MD 08/11/14 [phone]

## 2014-08-11 NOTE — Progress Notes (Addendum)
Pt. admitted to unit. Oriented to room, call bell, Ascom phones and staff. Bed in low position. Fall safety plan reviewed. Full assessment to Epic, expiratory wheezes noted on auscultation. Pt is stable and on 30% BiPAP. Pt reports more than 2 falls in past 6 moth and qualifies as high falls risk, requiring bed alarm; pt was educated and is refusing alarm at this time.  Will continue to monitor.

## 2014-08-11 NOTE — ED Notes (Signed)
Pt in room with Bipap. Alert and oriented.

## 2014-08-12 MED ORDER — METHYLPREDNISOLONE SODIUM SUCC 40 MG IJ SOLR
40.0000 mg | Freq: Three times a day (TID) | INTRAMUSCULAR | Status: DC
  Administered 2014-08-12 – 2014-08-13 (×3): 40 mg via INTRAVENOUS
  Filled 2014-08-12 (×3): qty 1

## 2014-08-12 NOTE — Progress Notes (Signed)
[facility] [place] at [facility]   PATIENT NAME: Julie Foley    MR#:  [id number]  DATE OF BIRTH:  1955/10/10  SUBJECTIVE:  Patient is doing much better this morning. She denies shortness of breath. She continues to have a nonproductive cough. She is still a bit anxious.   REVIEW OF SYSTEMS:    Review of Systems  Constitutional: Negative for fever and chills.  Respiratory: Positive for cough. Negative for sputum production. Shortness of breath: better.   Cardiovascular: Negative for chest pain, palpitations and orthopnea.  Gastrointestinal: Negative for heartburn, nausea and vomiting.  Neurological: Negative for tingling, sensory change and headaches.  Psychiatric/Behavioral: Negative for depression. The patient is nervous/anxious.     Tolerating Diet:yes      DRUG ALLERGIES:   Allergies  Allergen Reactions  . Budesonide-Formoterol Fumarate Swelling    VITALS:  Blood pressure 101/57, pulse 85, temperature 98.5 F (36.9 C), temperature source Oral, resp. rate 20, height 5\' 5"  (1.651 m), weight 75.751 kg (167 lb), SpO2 100 %.  PHYSICAL EXAMINATION:   Physical Exam  Constitutional: She is oriented to person, place, and time and well-developed, well-nourished, and in no distress.  HENT:  Head: Normocephalic and atraumatic.  Neck: Normal range of motion. Neck supple. No tracheal deviation present.  Cardiovascular: Regular rhythm.   Pulmonary/Chest: Effort normal and breath sounds normal. No respiratory distress. She has no wheezes. She has no rales.  Abdominal: Soft. Bowel sounds are normal. She exhibits no distension.  Musculoskeletal: Normal range of motion. She exhibits no edema.  Neurological: She is alert and oriented to person, place, and time. No cranial nerve deficit.  Skin: Skin is warm and dry. No erythema.      LABORATORY PANEL:   CBC  Recent Labs Lab 08/11/14 1406  WBC 9.6  HGB 13.0  HCT 39.4  PLT 326    ------------------------------------------------------------------------------------------------------------------  Chemistries  No results for input(s): NA, K, CL, CO2, GLUCOSE, BUN, CREATININE, CALCIUM, MG, AST, ALT, ALKPHOS, BILITOT in the last 168 hours.  Invalid input(s): GFRCGP ------------------------------------------------------------------------------------------------------------------  Cardiac Enzymes  Recent Labs Lab 08/11/14 1406  TROPONINI <0.03   ------------------------------------------------------------------------------------------------------------------  RADIOLOGY:  Dg Chest Port 1 View  08/11/2014   MPRESSION: Underlying emphysematous change.  No edema or consolidation.   Electronically Signed   By: Bretta BangWilliam  Woodruff III M.D.   On: 08/11/2014 14:52     ASSESSMENT AND PLAN:   59 year old female with chronic respiratory failure on home oxygen for COPD who presented with acute on chronic respiratory failure secondary to acute COPD exacerbation.  1. Acute on chronic respiratory failure: Patient is now up BiPap machine. She is much improved. We'll continue to monitor. She is on her baseline oxygen.  2.Acute COPD exacerbation: Patient has no wheezing today. She is now off her BiPAP. She is on her baseline oxygen of 3 L. I will wean down the steroids. I will continue her outpatient medications quitting inhalers.  3. Sinus tachycardia: Patients heart rate has improved.she is currently on Coreg which I will continue.  4. Essential hypertension: Patient will continue enalapril, Norvasc, and Coreg.          Management plans discussed with the patient and she is in agreement.  CODE STATUS: full  TOTAL TIME TAKING CARE OF THIS PATIENT: 31 minutes.   POSSIBLE D/C IN 1-2 DAYS, DEPENDING ON CLINICAL CONDITION.   Donn Zanetti M.D on 08/12/2014 at 12:34 PM  Between 7am to 6pm - Pager - [phone] After 6pm  go to [email or web address] - password [email or web address]  [facility]  [facility]  Office  [phone]  CC: Primary care physician; No PCP Per Patient

## 2014-08-13 MED ORDER — PREDNISONE 5 MG PO TABS: 5.0000 mg | ORAL_TABLET | Freq: Every day | ORAL | Status: DC

## 2014-08-13 MED ORDER — CARVEDILOL 12.5 MG PO TABS: 12.5000 mg | ORAL_TABLET | Freq: Two times a day (BID) | ORAL | Status: DC

## 2014-08-13 MED ORDER — LORAZEPAM 0.5 MG PO TABS: 0.5000 mg | ORAL_TABLET | Freq: Three times a day (TID) | ORAL | Status: DC | PRN

## 2014-08-13 MED ORDER — AZITHROMYCIN 250 MG PO TABS
250.0000 mg | ORAL_TABLET | Freq: Every day | ORAL | Status: DC
Start: 2014-08-13 — End: 2014-09-23

## 2014-08-13 NOTE — Progress Notes (Signed)
Julie Foley has chronic oxygen at home. Her PCP is Joie [facility]ily [facility] per Julie Foley, and she reports that she has a "case manager" [facility] who arranges her appointments.

## 2014-08-13 NOTE — Progress Notes (Signed)
Pt. Discharged to home with SO. Discharge instructions and medication regimen reviewed at bedside with patient and family member. Both verbalize understanding of instructions and medication regimen. Pt given prescriptions and knows that some of them have been called into her pharmacy. Patient assessment unchanged from this morning, and is leaving hospital with portable O2 tank on and in use. TELE and IV discontinued per policy.

## 2014-08-13 NOTE — Progress Notes (Signed)
Pt ambulated 22500ft around nurses station with home portable o2 tank. Tolerated well, dr. Juliene PinaMody aware and pt will be discharged later today.

## 2014-08-13 NOTE — Progress Notes (Signed)
Pt alert x4. VSS. No complaints of pain. Pt short of breath at rest and complain of SOBx1. Respiratory notified. Treatment given. Family at bedside. Stand by assist to bathroom. Bed alarm refused. bipap at night. 3L 02. Pt resting in bed. Will monitor..Marland Kitchen

## 2014-08-13 NOTE — Discharge Summary (Signed)
[facility] - [place] at [facility]   PATIENT NAME: Julie Foley    MR#:  [id number]  DATE OF BIRTH:  12/18/1955  DATE OF ADMISSION:  08/11/2014 ADMITTING PHYSICIAN: Adrian Saran, MD  DATE OF DISCHARGE: 08/13/2014 PRIMARY CARE PHYSICIAN: No PCP Per Patient    ADMISSION DIAGNOSIS:  Respiratory Distress  DISCHARGE DIAGNOSIS:  Active Problems:   COPD exacerbation   SECONDARY DIAGNOSIS:   Past Medical History  Diagnosis Date  . COPD (chronic obstructive pulmonary disease)   . CHF (congestive heart failure)     HOSPITAL COURSE:  This is a 59 year old female with a past medical history significant for chronic respiratory failure from COPD presented to the emergency room with acute and chronic restrictive failure from acute COPD exacerbation. For further details please refer the H&P.  1. Acute on chronic respiratory failure: Patient was initially placed on BiPAP machine. This was titrated off back to her home oxygen. The etiology of her respiratory failure was secondary to COPD exacerbation. I do believe patient should be evaluated for sleep study. I did discuss this with the patient.  2. Acute COPD exacerbation: Patient was placed on IV steroids, initially she was on BiPAP which was transitioned back to her baseline oxygen, azithromycin and DuoNeb nebs and her outpatient inhalers. Patient did quite well. Patient is not having any issues at discharge.. Patient is back on her baseline oxygen.  3. Sinus tachycardia: Patient's heart rates were elevated on admission due to problem #1. Her heart rates have improved. She was also placed on Coreg which she will continue.  4. Essential hypertension: Patient will continue on enalapril, Norvasc, and Coreg. She will need outpatient follow-up for blood pressure.    DISCHARGE CONDITIONS AND DIET:  Stable Regular diet  CONSULTS OBTAINED:     DRUG ALLERGIES:   Allergies  Allergen Reactions  .  Budesonide-Formoterol Fumarate Swelling    DISCHARGE MEDICATIONS:   Current Discharge Medication List    START taking these medications   Details  !! azithromycin (ZITHROMAX) 250 MG tablet Take 1 tablet (250 mg total) by mouth daily. Qty: 6 each, Refills: 0    carvedilol (COREG) 12.5 MG tablet Take 1 tablet (12.5 mg total) by mouth 2 (two) times daily with a meal. Qty: 30 tablet, Refills: 0    LORazepam (ATIVAN) 0.5 MG tablet Take 1 tablet (0.5 mg total) by mouth every 8 (eight) hours as needed for anxiety. Qty: 30 tablet, Refills: 0    predniSONE (DELTASONE) 5 MG tablet Take 1 tablet (5 mg total) by mouth daily with breakfast. Qty: 45 tablet, Refills: 0     !! - Potential duplicate medications found. Please discuss with provider.    CONTINUE these medications which have NOT CHANGED   Details  albuterol (PROVENTIL HFA;VENTOLIN HFA) 108 (90 BASE) MCG/ACT inhaler Inhale 1 puff into the lungs every 6 (six) hours as needed for wheezing or shortness of breath.    amLODipine (NORVASC) 5 MG tablet Take 5 mg by mouth daily.    !! azithromycin (ZITHROMAX) 250 MG tablet Take 250 mg by mouth daily.    beclomethasone (QVAR) 80 MCG/ACT inhaler Inhale 1 puff into the lungs 2 (two) times daily.    enalapril (VASOTEC) 20 MG tablet Take 20 mg by mouth daily.    famotidine (PEPCID) 20 MG tablet Take 20 mg by mouth 2 (two) times daily.    fluticasone (FLONASE) 50 MCG/ACT nasal spray Place 1 spray into both nostrils daily.    Fluticasone-Salmeterol (  ADVAIR) 500-50 MCG/DOSE AEPB Inhale 1 puff into the lungs 2 (two) times daily.    hydrochlorothiazide (HYDRODIURIL) 25 MG tablet Take 25 mg by mouth daily.    ipratropium-albuterol (DUONEB) 0.5-2.5 (3) MG/3ML SOLN Take 3 mLs by nebulization every 4 (four) hours as needed.    loratadine (CLARITIN) 10 MG tablet Take 10 mg by mouth daily.    montelukast (SINGULAIR) 10 MG tablet Take 10 mg by mouth every evening.    nitroGLYCERIN (NITROSTAT) 0.4  MG SL tablet Place 0.4 mg under the tongue every 5 (five) minutes as needed for chest pain.    roflumilast (DALIRESP) 500 MCG TABS tablet Take 500 mcg by mouth daily.    tiotropium (SPIRIVA) 18 MCG inhalation capsule Place 18 mcg into inhaler and inhale daily.     !! - Potential duplicate medications found. Please discuss with provider.            Today   CHIEF COMPLAINT:  She has no complaints this morning. She is feeling well. She ambulated without feeling short of breath.   VITAL SIGNS:  Blood pressure 99/66, pulse 88, temperature 98.3 F (36.8 C), temperature source Oral, resp. rate 18, height 5\' 5"  (1.651 m), weight 75.297 kg (166 lb), SpO2 100 %.   REVIEW OF SYSTEMS:  Review of Systems  Constitutional: Negative for fever and chills.  HENT: Negative for ear pain and tinnitus.   Respiratory: Positive for cough. Negative for hemoptysis, sputum production, shortness of breath and wheezing.   Cardiovascular: Negative for orthopnea.  Gastrointestinal: Negative for nausea, vomiting and abdominal pain.  Genitourinary: Negative for dysuria.  Musculoskeletal: Negative for myalgias.  Neurological: Negative for tingling and headaches.     PHYSICAL EXAMINATION:  GENERAL:  59 y.o.-year-old patient lying in the bed with no acute distress.  NECK:  Supple, no jugular venous distention. No thyroid enlargement, no tenderness.  LUNGS: Normal breath sounds bilaterally, no wheezing, rales,rhonchi  No use of accessory muscles of respiration.  CARDIOVASCULAR: S1, S2 normal. No murmurs, rubs, or gallops.  ABDOMEN: Soft, non-tender, non-distended. Bowel sounds present. No organomegaly or mass.  EXTREMITIES: No pedal edema, cyanosis, or clubbing.  PSYCHIATRIC: The patient is alert and oriented x 3.  SKIN: No obvious rash, lesion, or ulcer.   DATA REVIEW:   CBC  Recent Labs Lab 08/11/14 1406  WBC 9.6  HGB 13.0  HCT 39.4  PLT 326    Chemistries  No results for input(s): NA,  K, CL, CO2, GLUCOSE, BUN, CREATININE, CALCIUM, MG, AST, ALT, ALKPHOS, BILITOT in the last 168 hours.  Invalid input(s): GFRCGP  Cardiac Enzymes  Recent Labs Lab 08/11/14 1406  TROPONINI <0.03    Microbiology Results  @[email or web address] @  RADIOLOGY:  Dg Chest Port 1 View  08/11/2014   CLINICAL DATA:  Shortness of Breath  EXAM: PORTABLE CHEST - 1 VIEW  COMPARISON:  July 28, 2014  FINDINGS: There is underlying emphysematous change. There is no edema or consolidation. The heart size and pulmonary vascularity are within normal limits. No adenopathy. No bone lesions.  IMPRESSION: Underlying emphysematous change.  No edema or consolidation.   Electronically Signed   By: Bretta BangWilliam  Woodruff III M.D.   On: 08/11/2014 14:52      Management plans discussed with the patient and she is in agreement. Stable for discharge home  Patient should follow up with PCP  CODE STATUS:     Code Status Orders        Start     Ordered  08/11/14 1809  Full code   Continuous     08/11/14 1808      TOTAL TIME TAKING CARE OF THIS PATIENT: 35 minutes.    Cassy Sprowl M.D on 08/13/2014 at 11:54 AM  Between 7am to 6pm - Pager - [phone] After 6pm go to [email or web address] - password [email or web address] [facility]  [facility]  Office  [phone]  CC: Primary care physician; No PCP Per Patient

## 2014-09-22 ENCOUNTER — Emergency Department: Payer: Medicaid Other

## 2014-09-22 ENCOUNTER — Encounter: Payer: Self-pay | Admitting: *Deleted

## 2014-09-22 ENCOUNTER — Observation Stay
Admission: EM | Admit: 2014-09-22 | Discharge: 2014-09-23 | Disposition: A | Discharge status: Home / Self Care | Payer: Medicaid Other | Attending: Internal Medicine | Admitting: Internal Medicine

## 2014-09-22 DIAGNOSIS — K219 Gastro-esophageal reflux disease without esophagitis: Secondary | ICD-10-CM | POA: Insufficient documentation

## 2014-09-22 DIAGNOSIS — F329 Major depressive disorder, single episode, unspecified: Secondary | ICD-10-CM | POA: Diagnosis not present

## 2014-09-22 DIAGNOSIS — Z87891 Personal history of nicotine dependence: Secondary | ICD-10-CM | POA: Insufficient documentation

## 2014-09-22 DIAGNOSIS — Z79899 Other long term (current) drug therapy: Secondary | ICD-10-CM | POA: Insufficient documentation

## 2014-09-22 DIAGNOSIS — I509 Heart failure, unspecified: Secondary | ICD-10-CM | POA: Diagnosis not present

## 2014-09-22 DIAGNOSIS — J441 Chronic obstructive pulmonary disease with (acute) exacerbation: Secondary | ICD-10-CM | POA: Diagnosis present

## 2014-09-22 DIAGNOSIS — J9611 Chronic respiratory failure with hypoxia: Secondary | ICD-10-CM | POA: Diagnosis not present

## 2014-09-22 DIAGNOSIS — Z9981 Dependence on supplemental oxygen: Secondary | ICD-10-CM | POA: Insufficient documentation

## 2014-09-22 DIAGNOSIS — R000 Tachycardia, unspecified: Secondary | ICD-10-CM | POA: Diagnosis not present

## 2014-09-22 DIAGNOSIS — R0603 Acute respiratory distress: Secondary | ICD-10-CM

## 2014-09-22 DIAGNOSIS — Z7952 Long term (current) use of systemic steroids: Secondary | ICD-10-CM | POA: Insufficient documentation

## 2014-09-22 DIAGNOSIS — R0600 Dyspnea, unspecified: Secondary | ICD-10-CM | POA: Diagnosis present

## 2014-09-22 DIAGNOSIS — Z888 Allergy status to other drugs, medicaments and biological substances status: Secondary | ICD-10-CM | POA: Diagnosis not present

## 2014-09-22 DIAGNOSIS — R0902 Hypoxemia: Secondary | ICD-10-CM | POA: Diagnosis present

## 2014-09-22 DIAGNOSIS — I10 Essential (primary) hypertension: Secondary | ICD-10-CM | POA: Insufficient documentation

## 2014-09-22 DIAGNOSIS — Z7951 Long term (current) use of inhaled steroids: Secondary | ICD-10-CM | POA: Diagnosis not present

## 2014-09-22 HISTORY — DX: Essential (primary) hypertension: I10

## 2014-09-22 HISTORY — DX: Depression, unspecified: F32.A

## 2014-09-22 HISTORY — DX: Gastro-esophageal reflux disease without esophagitis: K21.9

## 2014-09-22 HISTORY — DX: Major depressive disorder, single episode, unspecified: F32.9

## 2014-09-22 LAB — CBC WITH DIFFERENTIAL/PLATELET
Basophils Absolute: 0.1 10*3/uL (ref 0–0.1)
Basophils Relative: 1 %
Eosinophils Absolute: 0.4 10*3/uL (ref 0–0.7)
Eosinophils Relative: 6 %
HCT: 41.7 % (ref 35.0–47.0)
Hemoglobin: 13.6 g/dL (ref 12.0–16.0)
LYMPHS ABS: 2 10*3/uL (ref 1.0–3.6)
LYMPHS PCT: 25 %
MCH: 32.7 pg (ref 26.0–34.0)
MCHC: 32.6 g/dL (ref 32.0–36.0)
MCV: 100.2 fL — ABNORMAL HIGH (ref 80.0–100.0)
Monocytes Absolute: 0.5 10*3/uL (ref 0.2–0.9)
Monocytes Relative: 6 %
NEUTROS PCT: 62 %
Neutro Abs: 4.9 10*3/uL (ref 1.4–6.5)
PLATELETS: 322 10*3/uL (ref 150–440)
RBC: 4.16 MIL/uL (ref 3.80–5.20)
RDW: 13.2 % (ref 11.5–14.5)
WBC: 7.9 10*3/uL (ref 3.6–11.0)

## 2014-09-22 LAB — BRAIN NATRIURETIC PEPTIDE: B Natriuretic Peptide: 15 pg/mL (ref 0.0–100.0)

## 2014-09-22 MED ORDER — LORAZEPAM 2 MG/ML IJ SOLN
1.0000 mg | Freq: Once | INTRAMUSCULAR | Status: AC
  Administered 2014-09-23: 1 mg via INTRAVENOUS

## 2014-09-22 MED ORDER — IPRATROPIUM-ALBUTEROL 0.5-2.5 (3) MG/3ML IN SOLN
3.0000 mL | Freq: Once | RESPIRATORY_TRACT | Status: AC
  Administered 2014-09-22: 3 mL via RESPIRATORY_TRACT

## 2014-09-22 MED ORDER — METHYLPREDNISOLONE SODIUM SUCC 125 MG IJ SOLR
125.0000 mg | Freq: Once | INTRAMUSCULAR | Status: AC
  Administered 2014-09-22: 125 mg via INTRAVENOUS

## 2014-09-22 MED ORDER — METHYLPREDNISOLONE SODIUM SUCC 125 MG IJ SOLR
INTRAMUSCULAR | Status: AC
  Administered 2014-09-22: 125 mg via INTRAVENOUS
  Filled 2014-09-22: qty 2

## 2014-09-22 MED ORDER — IPRATROPIUM-ALBUTEROL 0.5-2.5 (3) MG/3ML IN SOLN
RESPIRATORY_TRACT | Status: AC
  Filled 2014-09-22: qty 6

## 2014-09-22 NOTE — ED Notes (Signed)
Pt presents via EMS, respiratory hx and pneumonia, CHF. Pt presents in acute respiratory distress, on CPAP, nebs x 2 one of own med and one per EMS. EMS unable to establish PIV access. ST on 12-lead from EMS.

## 2014-09-22 NOTE — ED Provider Notes (Signed)
[facility] Emergency Department Provider Note  ____________________________________________  Time seen: Approximately 11:11 PM  I have reviewed the triage vital signs and the nursing notes.   HISTORY  Chief Complaint Respiratory Distress    HPI Julie Foley is a 59 y.o. female who presents via EMS for respiratory distress. Patient has a history of COPD, CHF, pneumonia on 3 L oxygen continuously. Patient states symptoms began today with nonproductive cough and increased dyspnea with wheezing. EMS administered 2 DuoNebs, one albuterol treatment. Saturations per EMS 91%; patient arrives on CPAP saturating 100%.Patient complains of chest tightness associated with breathing difficulty. Patient denies fever, chills, nausea, vomiting, diarrhea, abdominal pain, headache, numbness, tingling. Denies recent travel or surgery. Upon arrival patient states "Y'all have 4 hours, I ain't stayin".   Past Medical History  Diagnosis Date  . COPD (chronic obstructive pulmonary disease)   . CHF (congestive heart failure)     Patient Active Problem List   Diagnosis Date Noted  . COPD exacerbation 08/11/2014    Past Surgical History  Procedure Laterality Date  . Tubal ligation Bilateral     Current Outpatient Rx  Name  Route  Sig  Dispense  Refill  . albuterol (PROVENTIL HFA;VENTOLIN HFA) 108 (90 BASE) MCG/ACT inhaler   Inhalation   Inhale 1 puff into the lungs every 6 (six) hours as needed for wheezing or shortness of breath.         Marland Kitchen albuterol (PROVENTIL) (2.5 MG/3ML) 0.083% nebulizer solution   Nebulization   Take 2.5 mg by nebulization every 4 (four) hours as needed for wheezing or shortness of breath.         Marland Kitchen amLODipine (NORVASC) 5 MG tablet   Oral   Take 5 mg by mouth daily.         Marland Kitchen azithromycin (ZITHROMAX) 250 MG tablet   Oral   Take 250 mg by mouth daily.         Marland Kitchen azithromycin (ZITHROMAX) 250 MG tablet   Oral   Take 1 tablet (250 mg  total) by mouth daily.   6 each   0   . beclomethasone (QVAR) 80 MCG/ACT inhaler   Inhalation   Inhale 1 puff into the lungs 2 (two) times daily.         . carvedilol (COREG) 12.5 MG tablet   Oral   Take 1 tablet (12.5 mg total) by mouth 2 (two) times daily with a meal.   30 tablet   0   . enalapril (VASOTEC) 20 MG tablet   Oral   Take 20 mg by mouth 2 (two) times daily.          . famotidine (PEPCID) 20 MG tablet   Oral   Take 20 mg by mouth 2 (two) times daily.         . fluticasone (FLONASE) 50 MCG/ACT nasal spray   Each Nare   Place 1 spray into both nostrils daily.         . Fluticasone-Salmeterol (ADVAIR) 500-50 MCG/DOSE AEPB   Inhalation   Inhale 1 puff into the lungs 2 (two) times daily.         . hydrochlorothiazide (HYDRODIURIL) 25 MG tablet   Oral   Take 25 mg by mouth daily.         Marland Kitchen ipratropium (ATROVENT) 0.02 % nebulizer solution   Nebulization   Take 0.5 mg by nebulization 2 (two) times daily as needed for wheezing or shortness of breath.         Marland Kitchen  ipratropium-albuterol (DUONEB) 0.5-2.5 (3) MG/3ML SOLN   Nebulization   Take 3 mLs by nebulization every 4 (four) hours as needed.         . loratadine (CLARITIN) 10 MG tablet   Oral   Take 10 mg by mouth daily.         Marland Kitchen LORazepam (ATIVAN) 0.5 MG tablet   Oral   Take 1 tablet (0.5 mg total) by mouth every 8 (eight) hours as needed for anxiety.   30 tablet   0   . montelukast (SINGULAIR) 10 MG tablet   Oral   Take 10 mg by mouth every evening.         . nitroGLYCERIN (NITROSTAT) 0.4 MG SL tablet   Sublingual   Place 0.4 mg under the tongue every 5 (five) minutes as needed for chest pain.         . roflumilast (DALIRESP) 500 MCG TABS tablet   Oral   Take 500 mcg by mouth daily.         Marland Kitchen tiotropium (SPIRIVA) 18 MCG inhalation capsule   Inhalation   Place 18 mcg into inhaler and inhale daily.         . predniSONE (DELTASONE) 5 MG tablet   Oral   Take 1 tablet (5  mg total) by mouth daily with breakfast.   45 tablet   0     Label  & dispense according to the schedule below: ...     Allergies Budesonide-formoterol fumarate  Family History  Problem Relation Age of Onset  . Cancer Mother     Social History History  Substance Use Topics  . Smoking status: Former Smoker -- 2.00 packs/day for 35 years    Types: Cigarettes  . Smokeless tobacco: Not on file  . Alcohol Use: Yes    Review of Systems Constitutional: No fever/chills Eyes: No visual changes. ENT: No sore throat. Cardiovascular: Positive for chest tightness. Respiratory: Positive for shortness of breath. Gastrointestinal: No abdominal pain.  No nausea, no vomiting.  No diarrhea.  No constipation. Genitourinary: Negative for dysuria. Musculoskeletal: Negative for back pain. Skin: Negative for rash. Neurological: Negative for headaches, focal weakness or numbness.  10-point ROS otherwise negative.  ____________________________________________   PHYSICAL EXAM:  VITAL SIGNS: ED Triage Vitals  Enc Vitals Group     BP --      Pulse --      Resp --      Temp --      Temp src --      SpO2 09/22/14 2308 91 %     Weight --      Height --      Head Cir --      Peak Flow --      Pain Score --      Pain Loc --      Pain Edu? --      Excl. in GC? --     Constitutional: Alert and oriented. Ill-appearing and in moderate acute distress. Eyes: Conjunctivae are normal. PERRL. EOMI. Head: Atraumatic. Nose: No congestion/rhinnorhea. Mouth/Throat: Mucous membranes are moist.  Oropharynx non-erythematous. Neck: No stridor.   Cardiovascular: Normal rate, regular rhythm. Grossly normal heart sounds.  Good peripheral circulation. Respiratory: Tachypneic, diffuse wheezing, rales, rhonchi. Tripoding. Gastrointestinal: Soft and nontender. No distention. No abdominal bruits. No CVA tenderness. Musculoskeletal: No lower extremity tenderness nor edema.  No joint  effusions. Neurologic:  Normal speech and language. No gross focal neurologic deficits are appreciated. Speech is normal.  Skin:  Skin is warm, dry and intact. No rash noted. Psychiatric: Mood and affect are normal. Speech and behavior are normal.  ____________________________________________   LABS (all labs ordered are listed, but only abnormal results are displayed)  Labs Reviewed  CBC WITH DIFFERENTIAL/PLATELET - Abnormal; Notable for the following:    MCV 100.2 (*)    All other components within normal limits  BASIC METABOLIC PANEL  TROPONIN I  BRAIN NATRIURETIC PEPTIDE   ____________________________________________  EKG  ED ECG REPORT I, Shamiah Kahler J, the attending physician, personally viewed and interpreted this ECG.   Date: 09/22/2014  EKG Time: 2330  Rate: 129  Rhythm: sinus tachycardia  Axis: WNL  Intervals:none  ST&T Change: Nonspecific  ____________________________________________  RADIOLOGY  Portable chest x-ray (viewed by me, interpreted by Dr. Rito Ehrlich): No evidence of acute cardiopulmonary disease. ____________________________________________   PROCEDURES  Procedure(s) performed: None  Critical Care performed: CRITICAL CARE Performed by: Irean Hong   Total critical care time: 30 minutes  Critical care time was exclusive of separately billable procedures and treating other patients.  Critical care was necessary to treat or prevent imminent or life-threatening deterioration.  Critical care was time spent personally by me on the following activities: development of treatment plan with patient and/or surrogate as well as nursing, discussions with consultants, evaluation of patient's response to treatment, examination of patient, obtaining history from patient or surrogate, ordering and performing treatments and interventions, ordering and review of laboratory studies, ordering and review of radiographic studies, pulse oximetry and re-evaluation of  patient's condition.  ____________________________________________   INITIAL IMPRESSION / ASSESSMENT AND PLAN / ED COURSE  Pertinent labs & imaging results that were available during my care of the patient were reviewed by me and considered in my medical decision making (see chart for details).  59 year old female with a history of COPD, CHF on chronic oxygen therapy who presents with a one-day history of respiratory distress. Will maintain patient on BiPAP, administer IV steroids, additional DuoNeb treatments and reassess.  ----------------------------------------- 1:28 AM on 09/23/2014 -----------------------------------------  Patient sleeping in no acute distress. Heart rate 113. Wheezing and rhonchi much improved. Patient still adamant regarding discharge home. Will remove BiPAP, switch her over to her baseline 4 L nasal cannula and continue to monitor.  ----------------------------------------- 2:15 AM on 09/23/2014 -----------------------------------------  Heart rate now 130s. Diffuse wheezing returned. Discussed with patient who is agreeable to stay for admission. Discussed case with Dr. Anne Hahn who will evaluate patient in the ED for hospital admission. Additional DuoNeb will be given. ____________________________________________   FINAL CLINICAL IMPRESSION(S) / ED DIAGNOSES  Final diagnoses:  COPD with acute exacerbation  Respiratory distress  Hypoxemia      Irean Hong, MD 09/23/14 [phone]

## 2014-09-23 ENCOUNTER — Encounter: Payer: Self-pay | Admitting: Internal Medicine

## 2014-09-23 DIAGNOSIS — I509 Heart failure, unspecified: Secondary | ICD-10-CM

## 2014-09-23 DIAGNOSIS — K219 Gastro-esophageal reflux disease without esophagitis: Secondary | ICD-10-CM | POA: Diagnosis present

## 2014-09-23 DIAGNOSIS — I10 Essential (primary) hypertension: Secondary | ICD-10-CM | POA: Diagnosis present

## 2014-09-23 LAB — BASIC METABOLIC PANEL
ANION GAP: 7 (ref 5–15)
Anion gap: 7 (ref 5–15)
BUN: 16 mg/dL (ref 6–20)
BUN: 17 mg/dL (ref 6–20)
CALCIUM: 9.1 mg/dL (ref 8.9–10.3)
CHLORIDE: 105 mmol/L (ref 101–111)
CHLORIDE: 104 mmol/L (ref 101–111)
CO2: 31 mmol/L (ref 22–32)
CO2: 29 mmol/L (ref 22–32)
Calcium: 9.4 mg/dL (ref 8.9–10.3)
Creatinine, Ser: 0.94 mg/dL (ref 0.44–1.00)
Creatinine, Ser: 0.8 mg/dL (ref 0.44–1.00)
GFR calc Af Amer: >60 mL/min (ref >60)
GFR calc Af Amer: >60 mL/min (ref >60)
GFR calc non Af Amer: >60 mL/min (ref >60)
GFR calc non Af Amer: >60 mL/min (ref >60)
Glucose, Bld: 106 mg/dL — ABNORMAL HIGH (ref 65–99)
Glucose, Bld: 216 mg/dL — ABNORMAL HIGH (ref 65–99)
POTASSIUM: 4.1 mmol/L (ref 3.5–5.1)
Potassium: 4 mmol/L (ref 3.5–5.1)
Sodium: 143 mmol/L (ref 135–145)
Sodium: 140 mmol/L (ref 135–145)

## 2014-09-23 LAB — CBC
HCT: 38 % (ref 35.0–47.0)
HEMOGLOBIN: 12.7 g/dL (ref 12.0–16.0)
MCH: 33.5 pg (ref 26.0–34.0)
MCHC: 33.5 g/dL (ref 32.0–36.0)
MCV: 100.1 fL — ABNORMAL HIGH (ref 80.0–100.0)
Platelets: 302 10*3/uL (ref 150–440)
RBC: 3.8 MIL/uL (ref 3.80–5.20)
RDW: 13.1 % (ref 11.5–14.5)
WBC: 9.1 10*3/uL (ref 3.6–11.0)

## 2014-09-23 LAB — TROPONIN I

## 2014-09-23 MED ORDER — HYDROCHLOROTHIAZIDE 25 MG PO TABS: 25.0000 mg | ORAL_TABLET | Freq: Every day | ORAL | Status: DC

## 2014-09-23 MED ORDER — ACETAMINOPHEN 650 MG RE SUPP: 650.0000 mg | Freq: Four times a day (QID) | RECTAL | Status: DC | PRN

## 2014-09-23 MED ORDER — ENOXAPARIN SODIUM 40 MG/0.4ML ~~LOC~~ SOLN
40.0000 mg | SUBCUTANEOUS | Status: DC
  Administered 2014-09-23: 40 mg via SUBCUTANEOUS
  Filled 2014-09-23: qty 0.4

## 2014-09-23 MED ORDER — METHYLPREDNISOLONE SODIUM SUCC 125 MG IJ SOLR
60.0000 mg | Freq: Four times a day (QID) | INTRAMUSCULAR | Status: DC
  Administered 2014-09-23: 60 mg via INTRAVENOUS
  Filled 2014-09-23: qty 2

## 2014-09-23 MED ORDER — IPRATROPIUM-ALBUTEROL 0.5-2.5 (3) MG/3ML IN SOLN
RESPIRATORY_TRACT | Status: AC
  Filled 2014-09-23: qty 3

## 2014-09-23 MED ORDER — TIOTROPIUM BROMIDE MONOHYDRATE 18 MCG IN CAPS
18.0000 ug | ORAL_CAPSULE | Freq: Every day | RESPIRATORY_TRACT | Status: DC
  Administered 2014-09-23: 18 ug via RESPIRATORY_TRACT
  Filled 2014-09-23: qty 5

## 2014-09-23 MED ORDER — PREDNISONE 5 MG PO TABS: 20.0000 mg | ORAL_TABLET | Freq: Every day | ORAL | Status: DC

## 2014-09-23 MED ORDER — MOMETASONE FURO-FORMOTEROL FUM 200-5 MCG/ACT IN AERO
2.0000 | INHALATION_SPRAY | Freq: Two times a day (BID) | RESPIRATORY_TRACT | Status: DC
  Administered 2014-09-23: 2 via RESPIRATORY_TRACT
  Filled 2014-09-23: qty 8.8

## 2014-09-23 MED ORDER — MONTELUKAST SODIUM 10 MG PO TABS: 10.0000 mg | ORAL_TABLET | Freq: Every evening | ORAL | Status: DC

## 2014-09-23 MED ORDER — ENALAPRIL MALEATE 10 MG PO TABS: 20.0000 mg | ORAL_TABLET | Freq: Two times a day (BID) | ORAL | Status: DC

## 2014-09-23 MED ORDER — DEXTROSE 5 % IV SOLN
500.0000 mg | INTRAVENOUS | Status: DC
  Administered 2014-09-23: 500 mg via INTRAVENOUS
  Filled 2014-09-23 (×2): qty 500

## 2014-09-23 MED ORDER — LORAZEPAM 2 MG/ML IJ SOLN
INTRAMUSCULAR | Status: AC
  Administered 2014-09-23: 1 mg via INTRAVENOUS
  Filled 2014-09-23: qty 1

## 2014-09-23 MED ORDER — ROFLUMILAST 500 MCG PO TABS: 500.0000 ug | ORAL_TABLET | Freq: Every day | ORAL | Status: DC

## 2014-09-23 MED ORDER — AMLODIPINE BESYLATE 5 MG PO TABS: 5.0000 mg | ORAL_TABLET | Freq: Every day | ORAL | Status: DC

## 2014-09-23 MED ORDER — LORAZEPAM 0.5 MG PO TABS
0.5000 mg | ORAL_TABLET | Freq: Three times a day (TID) | ORAL | Status: DC
  Administered 2014-09-23: 0.5 mg via ORAL
  Filled 2014-09-23: qty 1

## 2014-09-23 MED ORDER — LORAZEPAM 0.5 MG PO TABS: 0.5000 mg | ORAL_TABLET | Freq: Three times a day (TID) | ORAL | Status: DC | PRN

## 2014-09-23 MED ORDER — IPRATROPIUM-ALBUTEROL 0.5-2.5 (3) MG/3ML IN SOLN
3.0000 mL | Freq: Once | RESPIRATORY_TRACT | Status: AC
  Administered 2014-09-23: 3 mL via RESPIRATORY_TRACT

## 2014-09-23 MED ORDER — LEVALBUTEROL HCL 1.25 MG/0.5ML IN NEBU
1.2500 mg | INHALATION_SOLUTION | Freq: Four times a day (QID) | RESPIRATORY_TRACT | Status: DC | PRN
Start: 2014-09-23 — End: 2014-09-23
  Administered 2014-09-23: 1.25 mg via RESPIRATORY_TRACT
  Filled 2014-09-23: qty 0.5

## 2014-09-23 MED ORDER — ACETAMINOPHEN 325 MG PO TABS: 650.0000 mg | ORAL_TABLET | Freq: Four times a day (QID) | ORAL | Status: DC | PRN

## 2014-09-23 MED ORDER — FAMOTIDINE 20 MG PO TABS: 20.0000 mg | ORAL_TABLET | Freq: Two times a day (BID) | ORAL | Status: DC

## 2014-09-23 NOTE — Discharge Summary (Signed)
[facility] [place] [facility]   PATIENT NAME: Julie Foley    MR#:  [id number]  DATE OF BIRTH:  [birth year]  DATE OF ADMISSION:  09/22/2014 ADMITTING PHYSICIAN: Oralia Manis, MD  DATE OF DISCHARGE: 09/23/2014 10:45 AM  PRIMARY CARE PHYSICIAN: No PCP Per Patient    ADMISSION DIAGNOSIS:  Hypoxemia [R09.02] Respiratory distress [R06.00] COPD with acute exacerbation [J44.1]   DISCHARGE DIAGNOSIS:   COPD with acute exacerbation Chronic respiratory failure on home oxygen 4 L SECONDARY DIAGNOSIS:   Past Medical History  Diagnosis Date  . COPD (chronic obstructive pulmonary disease)   . CHF (congestive heart failure)   . HTN (hypertension)   . GERD (gastroesophageal reflux disease)   . Depression     HOSPITAL COURSE:   COPD exacerbation. She has been treated with steroids, azithromycin, Xopenex nebs and Daliresp. Her symptoms has much improved. She wants to go home today. Her O2 saturation is normal and Her lungs sound is clear. She is on home oxygen 4 L by nasal cannular. But at this time, she is off oxygen without desaturation. She is clinically stable will be discharged to home today.  DISCHARGE CONDITIONS:   Stable.  CONSULTS OBTAINED:     DRUG ALLERGIES:   Allergies  Allergen Reactions  . Budesonide-Formoterol Fumarate Swelling    DISCHARGE MEDICATIONS:   Discharge Medication List as of 09/23/2014  9:57 AM    CONTINUE these medications which have CHANGED   Details  LORazepam (ATIVAN) 0.5 MG tablet Take 1 tablet (0.5 mg total) by mouth every 8 (eight) hours as needed for anxiety., Starting 09/23/2014, Until Discontinued, Print    predniSONE (DELTASONE) 5 MG tablet Take 4 tablets (20 mg total) by mouth daily with breakfast. 4 tab po daily for 2 days, then, 2 tab po for 2 days, then 1 tab for 2 days., Starting 09/23/2014, Until Discontinued, Print      CONTINUE these medications which have NOT CHANGED   Details  albuterol  (PROVENTIL HFA;VENTOLIN HFA) 108 (90 BASE) MCG/ACT inhaler Inhale 1 puff into the lungs every 6 (six) hours as needed for wheezing or shortness of breath., Until Discontinued, Historical Med    albuterol (PROVENTIL) (2.5 MG/3ML) 0.083% nebulizer solution Take 2.5 mg by nebulization every 4 (four) hours as needed for wheezing or shortness of breath., Until Discontinued, Historical Med    amLODipine (NORVASC) 5 MG tablet Take 5 mg by mouth daily., Until Discontinued, Historical Med    beclomethasone (QVAR) 80 MCG/ACT inhaler Inhale 1 puff into the lungs 2 (two) times daily., Until Discontinued, Historical Med    carvedilol (COREG) 12.5 MG tablet Take 1 tablet (12.5 mg total) by mouth 2 (two) times daily with a meal., Starting 08/13/2014, Until Discontinued, Normal    enalapril (VASOTEC) 20 MG tablet Take 20 mg by mouth 2 (two) times daily. , Until Discontinued, Historical Med    famotidine (PEPCID) 20 MG tablet Take 20 mg by mouth 2 (two) times daily., Until Discontinued, Historical Med    fluticasone (FLONASE) 50 MCG/ACT nasal spray Place 1 spray into both nostrils daily., Until Discontinued, Historical Med    Fluticasone-Salmeterol (ADVAIR) 500-50 MCG/DOSE AEPB Inhale 1 puff into the lungs 2 (two) times daily., Until Discontinued, Historical Med    hydrochlorothiazide (HYDRODIURIL) 25 MG tablet Take 25 mg by mouth daily., Until Discontinued, Historical Med    ipratropium (ATROVENT) 0.02 % nebulizer solution Take 0.5 mg by nebulization 2 (two) times daily as needed for wheezing or shortness of  breath., Until Discontinued, Historical Med    ipratropium-albuterol (DUONEB) 0.5-2.5 (3) MG/3ML SOLN Take 3 mLs by nebulization every 4 (four) hours as needed., Until Discontinued, Historical Med    loratadine (CLARITIN) 10 MG tablet Take 10 mg by mouth daily., Until Discontinued, Historical Med    montelukast (SINGULAIR) 10 MG tablet Take 10 mg by mouth every evening., Until Discontinued, Historical  Med    nitroGLYCERIN (NITROSTAT) 0.4 MG SL tablet Place 0.4 mg under the tongue every 5 (five) minutes as needed for chest pain., Until Discontinued, Historical Med    roflumilast (DALIRESP) 500 MCG TABS tablet Take 500 mcg by mouth daily., Until Discontinued, Historical Med    tiotropium (SPIRIVA) 18 MCG inhalation capsule Place 18 mcg into inhaler and inhale daily., Until Discontinued, Historical Med      STOP taking these medications     azithromycin (ZITHROMAX) 250 MG tablet      azithromycin (ZITHROMAX) 250 MG tablet          DISCHARGE INSTRUCTIONS:    If you experience worsening of your admission symptoms, develop shortness of breath, life threatening emergency, suicidal or homicidal thoughts you must seek medical attention immediately by calling 911 or calling your MD immediately  if symptoms less severe.  You Must read complete instructions/literature along with all the possible adverse reactions/side effects for all the Medicines you take and that have been prescribed to you. Take any new Medicines after you have completely understood and accept all the possible adverse reactions/side effects.   Please note  You were cared for by a hospitalist during your hospital stay. If you have any questions about your discharge medications or the care you received while you were in the hospital after you are discharged, you can call the unit and asked to speak with the hospitalist on call if the hospitalist that took care of you is not available. Once you are discharged, your primary care physician will handle any further medical issues. Please note that NO REFILLS for any discharge medications will be authorized once you are discharged, as it is imperative that you return to your primary care physician (or establish a relationship with a primary care physician if you do not have one) for your aftercare needs so that they can reassess your need for medications and monitor your lab  values.    Today   SUBJECTIVE   No complaints. Shortness of breath has much improved.   VITAL SIGNS:  Blood pressure 119/81, pulse 97, temperature 96.9 F (36.1 C), temperature source Axillary, resp. rate 18, height 5\' 2"  (1.575 m), weight 72.576 kg (160 lb), SpO2 100 %.  I/O:   Intake/Output Summary (Last 24 hours) at 09/23/14 1157 Last data filed at 09/23/14 0918  Gross per 24 hour  Intake    240 ml  Output      0 ml  Net    240 ml    PHYSICAL EXAMINATION:  GENERAL:  60 y.o.-year-old patient lying in the bed with no acute distress.  EYES: Pupils equal, round, reactive to light and accommodation. No scleral icterus. Extraocular muscles intact.  HEENT: Head atraumatic, normocephalic. Oropharynx and nasopharynx clear.  NECK:  Supple, no jugular venous distention. No thyroid enlargement, no tenderness.  LUNGS: Normal breath sounds bilaterally, no wheezing, rales,rhonchi or crepitation. No use of accessory muscles of respiration.  CARDIOVASCULAR: S1, S2 normal. No murmurs, rubs, or gallops.  ABDOMEN: Soft, non-tender, non-distended. Bowel sounds present. No organomegaly or mass.  EXTREMITIES: No pedal edema, cyanosis,  or clubbing.  NEUROLOGIC: Cranial nerves II through XII are intact. Muscle strength 5/5 in all extremities. Sensation intact. Gait not checked.  PSYCHIATRIC: The patient is alert and oriented x 3.  SKIN: No obvious rash, lesion, or ulcer.   DATA REVIEW:   CBC  Recent Labs Lab 09/23/14 0401  WBC 9.1  HGB 12.7  HCT 38.0  PLT 302    Chemistries   Recent Labs Lab 09/23/14 0401  NA 140  K 4.0  CL 104  CO2 29  GLUCOSE 216*  BUN 17  CREATININE 0.80  CALCIUM 9.1    Cardiac Enzymes  Recent Labs Lab 09/22/14 2315  TROPONINI <0.03    Microbiology Results  Results for orders placed or performed during the hospital encounter of 07/04/14  Culture, blood (single)     Status: None   Collection Time: 07/04/14  4:07 PM  Result Value Ref Range  Status   Micro Text Report   Final       COMMENT                   NO GROWTH AEROBICALLY/ANAEROBICALLY IN 5 DAYS   ANTIBIOTIC                                                      Culture, blood (single)     Status: None   Collection Time: 07/04/14  4:30 PM  Result Value Ref Range Status   Micro Text Report   Final       COMMENT                   NO GROWTH AEROBICALLY/ANAEROBICALLY IN 5 DAYS   ANTIBIOTIC                                                        RADIOLOGY:  Dg Chest Port 1 View  09/22/2014   CLINICAL DATA:  Respiratory distress  EXAM: PORTABLE CHEST - 1 VIEW  COMPARISON:  08/11/2014  FINDINGS: Lungs are clear.  No pleural effusion or pneumothorax.  The heart is normal in size.  IMPRESSION: No evidence of acute cardiopulmonary disease.   Electronically Signed   By: Charline Bills M.D.   On: 09/22/2014 23:40        Discharge plans discussed with the patient, she is in agreement.  CODE STATUS:     Code Status Orders        Start     Ordered   09/23/14 0319  Full code   Continuous     09/23/14 0318      TOTAL TIME TAKING CARE OF THIS PATIENT: 35 minutes.    Shaune Pollack M.D on 09/23/2014 at 11:57 AM  Between 7am to 6pm - Pager - [phone]  After 6pm go to [email or web address] - password [email or web address] [facility]  [facility]  Office  [phone]  CC: Primary care physician; No PCP Per Patient

## 2014-09-23 NOTE — Progress Notes (Signed)
Spoke with Dr. Sheryle Hail pt takes 0.5 mg of lorazepam at home for anxiety. MD to place order.

## 2014-09-23 NOTE — Discharge Instructions (Signed)
Low sodium and low fat diet. Activity as tolerated. Continue home Oxygen 4 L [place]. Follow up pulmonary physician in [facility].

## 2014-09-23 NOTE — Progress Notes (Signed)
Patient A&O & VSS.  Lungs sounds are clear this morning. Dr. Imogene Burn discharging. D/C & Rx instructions given. Belongings packed & iv removed. Patient's ride here to pick her up.

## 2014-09-23 NOTE — Progress Notes (Signed)
Pt is alert and oriented. On 4L of oxygen and tolerating well. Started on iv antibiotics. Diminished lungs sounds and some expiratory wheezing.

## 2014-09-23 NOTE — H&P (Signed)
[facility] [place] at [facility]   PATIENT NAME: Julie Foley    MR#:  [id number]  DATE OF BIRTH:  [birth year]  DATE OF ADMISSION:  09/22/2014  PRIMARY CARE PHYSICIAN: No PCP Per Patient   REQUESTING/REFERRING PHYSICIAN: Dolores Frame  CHIEF COMPLAINT:   Chief Complaint  Patient presents with  . Respiratory Distress    HISTORY OF PRESENT ILLNESS:  Julie Foley  is a 59 y.o. female who presents with shortness of breath. Patient has a known history of severe COPD, and is coming in the past for COPD exacerbations. She presented tonight after 1 day of progressive shortness of breath. Initially she came in on CPAP via EMS, and was changed to BiPAP here in the ED. She received initial treatment with Solu-Medrol, and duo nebs, and improved her oxygen saturation in the ED to the point of being able to wean off the BiPAP. However shortly after that her shortness of breath progressed again and her wheezing returned. At this point hospitalists were called for admission for COPD exacerbation.  PAST MEDICAL HISTORY:   Past Medical History  Diagnosis Date  . COPD (chronic obstructive pulmonary disease)   . CHF (congestive heart failure)   . HTN (hypertension)   . GERD (gastroesophageal reflux disease)   . Depression     PAST SURGICAL HISTORY:   Past Surgical History  Procedure Laterality Date  . Tubal ligation Bilateral     SOCIAL HISTORY:   History  Substance Use Topics  . Smoking status: Former Smoker -- 2.00 packs/day for 35 years    Types: Cigarettes  . Smokeless tobacco: Not on file  . Alcohol Use: Yes    FAMILY HISTORY:   Family History  Problem Relation Age of Onset  . Cancer Mother   . Hypertension Mother   . Diabetes Mother   . Asthma Son     DRUG ALLERGIES:   Allergies  Allergen Reactions  . Budesonide-Formoterol Fumarate Swelling    MEDICATIONS AT HOME:   Prior to Admission medications   Medication Sig Start Date End Date Taking?  Authorizing Provider  albuterol (PROVENTIL HFA;VENTOLIN HFA) 108 (90 BASE) MCG/ACT inhaler Inhale 1 puff into the lungs every 6 (six) hours as needed for wheezing or shortness of breath.   Yes Historical Provider, MD  albuterol (PROVENTIL) (2.5 MG/3ML) 0.083% nebulizer solution Take 2.5 mg by nebulization every 4 (four) hours as needed for wheezing or shortness of breath.   Yes Historical Provider, MD  amLODipine (NORVASC) 5 MG tablet Take 5 mg by mouth daily.   Yes Historical Provider, MD  azithromycin (ZITHROMAX) 250 MG tablet Take 250 mg by mouth daily.   Yes Historical Provider, MD  azithromycin (ZITHROMAX) 250 MG tablet Take 1 tablet (250 mg total) by mouth daily. 08/13/14  Yes Sital Mody, MD  beclomethasone (QVAR) 80 MCG/ACT inhaler Inhale 1 puff into the lungs 2 (two) times daily.   Yes Historical Provider, MD  carvedilol (COREG) 12.5 MG tablet Take 1 tablet (12.5 mg total) by mouth 2 (two) times daily with a meal. 08/13/14  Yes Sital Mody, MD  enalapril (VASOTEC) 20 MG tablet Take 20 mg by mouth 2 (two) times daily.    Yes Historical Provider, MD  famotidine (PEPCID) 20 MG tablet Take 20 mg by mouth 2 (two) times daily.   Yes Historical Provider, MD  fluticasone (FLONASE) 50 MCG/ACT nasal spray Place 1 spray into both nostrils daily.   Yes Historical Provider, MD  Fluticasone-Salmeterol (ADVAIR) 500-50 MCG/DOSE  AEPB Inhale 1 puff into the lungs 2 (two) times daily.   Yes Historical Provider, MD  hydrochlorothiazide (HYDRODIURIL) 25 MG tablet Take 25 mg by mouth daily.   Yes Historical Provider, MD  ipratropium (ATROVENT) 0.02 % nebulizer solution Take 0.5 mg by nebulization 2 (two) times daily as needed for wheezing or shortness of breath.   Yes Historical Provider, MD  ipratropium-albuterol (DUONEB) 0.5-2.5 (3) MG/3ML SOLN Take 3 mLs by nebulization every 4 (four) hours as needed.   Yes Historical Provider, MD  loratadine (CLARITIN) 10 MG tablet Take 10 mg by mouth daily.   Yes Historical  Provider, MD  LORazepam (ATIVAN) 0.5 MG tablet Take 1 tablet (0.5 mg total) by mouth every 8 (eight) hours as needed for anxiety. 08/13/14  Yes Sital Mody, MD  montelukast (SINGULAIR) 10 MG tablet Take 10 mg by mouth every evening.   Yes Historical Provider, MD  nitroGLYCERIN (NITROSTAT) 0.4 MG SL tablet Place 0.4 mg under the tongue every 5 (five) minutes as needed for chest pain.   Yes Historical Provider, MD  roflumilast (DALIRESP) 500 MCG TABS tablet Take 500 mcg by mouth daily.   Yes Historical Provider, MD  tiotropium (SPIRIVA) 18 MCG inhalation capsule Place 18 mcg into inhaler and inhale daily.   Yes Historical Provider, MD  predniSONE (DELTASONE) 5 MG tablet Take 1 tablet (5 mg total) by mouth daily with breakfast. 08/13/14   Adrian Saran, MD    REVIEW OF SYSTEMS:  Review of Systems  Constitutional: Negative for fever, chills, weight loss and malaise/fatigue.  HENT: Negative for ear pain, hearing loss and tinnitus.   Eyes: Negative for blurred vision, double vision, pain and redness.  Respiratory: Positive for cough, shortness of breath and wheezing. Negative for hemoptysis.   Cardiovascular: Negative for chest pain, palpitations, orthopnea and leg swelling.  Gastrointestinal: Negative for nausea, vomiting, abdominal pain, diarrhea and constipation.  Genitourinary: Negative for dysuria, frequency and hematuria.  Musculoskeletal: Negative for back pain, joint pain and neck pain.  Skin:       No acne, rash, or lesions  Neurological: Negative for dizziness, tremors, focal weakness and weakness.  Endo/Heme/Allergies: Negative for polydipsia. Does not bruise/bleed easily.  Psychiatric/Behavioral: Negative for depression. The patient is not nervous/anxious and does not have insomnia.      VITAL SIGNS:   Filed Vitals:   09/22/14 2346 09/23/14 0029 09/23/14 0102 09/23/14 0236  BP: 107/82 124/89 120/81 108/70  Pulse: 140 132 121 100  Temp:      TempSrc:      Resp: 17 19 20 25   Height:       Weight:      SpO2: 100% 100% 98% 100%   Wt Readings from Last 3 Encounters:  09/22/14 72.576 kg (160 lb)  08/13/14 75.297 kg (166 lb)    PHYSICAL EXAMINATION:  Physical Exam  Constitutional: She is oriented to person, place, and time. She appears well-developed and well-nourished. No distress.  HENT:  Head: Normocephalic and atraumatic.  Mouth/Throat: Oropharynx is clear and moist.  Eyes: Conjunctivae and EOM are normal. Pupils are equal, round, and reactive to light. No scleral icterus.  Neck: Normal range of motion. Neck supple. No JVD present. No thyromegaly present.  Cardiovascular: Regular rhythm and intact distal pulses.  Exam reveals no gallop and no friction rub.   No murmur heard. Tachycardic  Respiratory: She is in respiratory distress (mild). She has wheezes (diffuse bilateral expiratory). She has no rales.  GI: Soft. Bowel sounds are normal. She  exhibits no distension. There is no tenderness.  Musculoskeletal: Normal range of motion. She exhibits no edema.  No arthritis, no gout  Lymphadenopathy:    She has no cervical adenopathy.  Neurological: She is alert and oriented to person, place, and time. No cranial nerve deficit.  No dysarthria, no aphasia  Skin: Skin is warm and dry. No rash noted. No erythema.  Psychiatric: She has a normal mood and affect. Her behavior is normal. Judgment and thought content normal.    LABORATORY PANEL:   CBC  Recent Labs Lab 09/22/14 2315  WBC 7.9  HGB 13.6  HCT 41.7  PLT 322   ------------------------------------------------------------------------------------------------------------------  Chemistries   Recent Labs Lab 09/22/14 2315  NA 143  K 4.1  CL 105  CO2 31  GLUCOSE 106*  BUN 16  CREATININE 0.94  CALCIUM 9.4   ------------------------------------------------------------------------------------------------------------------  Cardiac Enzymes  Recent Labs Lab 09/22/14 2315  TROPONINI <0.03    ------------------------------------------------------------------------------------------------------------------  RADIOLOGY:  Dg Chest Port 1 View  09/22/2014   CLINICAL DATA:  Respiratory distress  EXAM: PORTABLE CHEST - 1 VIEW  COMPARISON:  08/11/2014  FINDINGS: Lungs are clear.  No pleural effusion or pneumothorax.  The heart is normal in size.  IMPRESSION: No evidence of acute cardiopulmonary disease.   Electronically Signed   By: Charline Bills M.D.   On: 09/22/2014 23:40    EKG:   Orders placed or performed during the hospital encounter of 09/22/14  . ED EKG  . ED EKG    IMPRESSION AND PLAN:  Principal Problem:   COPD exacerbation - this is not an uncommon occurrence for this patient. She initially did well with treatment in the ED, but then had a slight worsening of her estrogen status again. We will admit her, continue steroids, azithromycin, Xopenex nebs due to tachycardia, and continue home inhalers as well as Daliresp. Active Problems:   HTN (hypertension) - chronic stable problem currently controlled, continue home meds   CHF (congestive heart failure) - chest x-ray and BNP do not indicate any exacerbation at this time, continue home medications.   GERD (gastroesophageal reflux disease) - home dose H2 blockers.  All the records are reviewed and case discussed with ED provider. Management plans discussed with the patient and/or family.  DVT PROPHYLAXIS: SubQ lovenox  ADMISSION STATUS: Observation  CODE STATUS: full   TOTAL TIME TAKING CARE OF THIS PATIENT: 40 minutes.    Julie Foley 09/23/2014, 2:40 AM  [facility]  Office  [phone]  CC: Primary care physician; No PCP Per Patient

## 2014-10-06 ENCOUNTER — Inpatient Hospital Stay
Admission: EM | Admit: 2014-10-06 | Discharge: 2014-10-09 | DRG: 190 | Disposition: A | Discharge status: Home / Self Care | Payer: Medicaid Other | Attending: Internal Medicine | Admitting: Internal Medicine

## 2014-10-06 ENCOUNTER — Emergency Department: Payer: Medicaid Other

## 2014-10-06 DIAGNOSIS — J9601 Acute respiratory failure with hypoxia: Secondary | ICD-10-CM | POA: Diagnosis present

## 2014-10-06 DIAGNOSIS — I10 Essential (primary) hypertension: Secondary | ICD-10-CM | POA: Diagnosis present

## 2014-10-06 DIAGNOSIS — I509 Heart failure, unspecified: Secondary | ICD-10-CM

## 2014-10-06 DIAGNOSIS — F419 Anxiety disorder, unspecified: Secondary | ICD-10-CM | POA: Diagnosis present

## 2014-10-06 DIAGNOSIS — Z7951 Long term (current) use of inhaled steroids: Secondary | ICD-10-CM

## 2014-10-06 DIAGNOSIS — Z8249 Family history of ischemic heart disease and other diseases of the circulatory system: Secondary | ICD-10-CM

## 2014-10-06 DIAGNOSIS — Z7952 Long term (current) use of systemic steroids: Secondary | ICD-10-CM

## 2014-10-06 DIAGNOSIS — Z79899 Other long term (current) drug therapy: Secondary | ICD-10-CM

## 2014-10-06 DIAGNOSIS — Z833 Family history of diabetes mellitus: Secondary | ICD-10-CM

## 2014-10-06 DIAGNOSIS — F329 Major depressive disorder, single episode, unspecified: Secondary | ICD-10-CM | POA: Diagnosis present

## 2014-10-06 DIAGNOSIS — J9621 Acute and chronic respiratory failure with hypoxia: Secondary | ICD-10-CM | POA: Diagnosis present

## 2014-10-06 DIAGNOSIS — Z809 Family history of malignant neoplasm, unspecified: Secondary | ICD-10-CM

## 2014-10-06 DIAGNOSIS — Z87891 Personal history of nicotine dependence: Secondary | ICD-10-CM

## 2014-10-06 DIAGNOSIS — K219 Gastro-esophageal reflux disease without esophagitis: Secondary | ICD-10-CM | POA: Diagnosis present

## 2014-10-06 DIAGNOSIS — Z825 Family history of asthma and other chronic lower respiratory diseases: Secondary | ICD-10-CM

## 2014-10-06 DIAGNOSIS — Z888 Allergy status to other drugs, medicaments and biological substances status: Secondary | ICD-10-CM

## 2014-10-06 DIAGNOSIS — J441 Chronic obstructive pulmonary disease with (acute) exacerbation: Principal | ICD-10-CM | POA: Diagnosis present

## 2014-10-06 HISTORY — DX: Anxiety disorder, unspecified: F41.9

## 2014-10-06 MED ORDER — IPRATROPIUM-ALBUTEROL 0.5-2.5 (3) MG/3ML IN SOLN
9.0000 mL | Freq: Once | RESPIRATORY_TRACT | Status: AC
  Administered 2014-10-06: 9 mL via RESPIRATORY_TRACT

## 2014-10-06 MED ORDER — METHYLPREDNISOLONE SODIUM SUCC 125 MG IJ SOLR
125.0000 mg | Freq: Once | INTRAMUSCULAR | Status: AC
  Administered 2014-10-06: 125 mg via INTRAVENOUS

## 2014-10-06 MED ORDER — IPRATROPIUM-ALBUTEROL 0.5-2.5 (3) MG/3ML IN SOLN
RESPIRATORY_TRACT | Status: AC
  Filled 2014-10-06: qty 9

## 2014-10-06 MED ORDER — METHYLPREDNISOLONE SODIUM SUCC 125 MG IJ SOLR
INTRAMUSCULAR | Status: AC
  Filled 2014-10-06: qty 2

## 2014-10-06 MED ORDER — LORAZEPAM 2 MG/ML IJ SOLN
1.0000 mg | Freq: Once | INTRAMUSCULAR | Status: AC
  Administered 2014-10-06: 1 mg via INTRAVENOUS

## 2014-10-06 MED ORDER — MAGNESIUM SULFATE 2 GM/50ML IV SOLN
INTRAVENOUS | Status: AC
  Filled 2014-10-06: qty 50

## 2014-10-06 MED ORDER — MAGNESIUM SULFATE 2 GM/50ML IV SOLN
2.0000 g | Freq: Once | INTRAVENOUS | Status: AC
  Administered 2014-10-06: 2 g via INTRAVENOUS

## 2014-10-06 MED ORDER — DEXTROSE 5 % IV SOLN
500.0000 mg | Freq: Once | INTRAVENOUS | Status: AC
  Administered 2014-10-06: 500 mg via INTRAVENOUS

## 2014-10-06 MED ORDER — DEXTROSE 5 % IV SOLN
INTRAVENOUS | Status: AC
  Filled 2014-10-06: qty 500

## 2014-10-06 MED ORDER — LORAZEPAM 2 MG/ML IJ SOLN
INTRAMUSCULAR | Status: AC
  Filled 2014-10-06: qty 1

## 2014-10-06 NOTE — ED Notes (Signed)
Pt to ED via EMS c/o dyspnea, CPAP used while en route.

## 2014-10-06 NOTE — ED Provider Notes (Addendum)
[facility] Emergency Department Provider Note  ____________________________________________  Time seen: Seen upon arrival to the emergency department.  I have reviewed the triage vital signs and the nursing notes.   HISTORY  Chief Complaint Shortness of Breath    HPI Julie Foley is a 59 y.o. female with a history of COPD and CHF who presents today with several hours of shortness of breath. She says that this episode came on suddenly and has been worsening over the course the evening. She took several nebulizer treatments at home which said loosened up some secretions. She was having a cough with white sputum and ambulance. She was placed on CPAP and a balance but the paramedics were unable to start a line. She is also complaining of diffuse chest tightness which she says feels similar to her anxiety which she's had in the past with her COPD exacerbations. She denies smoking. She says she has quit smoking almost one year ago.   Past Medical History  Diagnosis Date  . COPD (chronic obstructive pulmonary disease)   . CHF (congestive heart failure)   . HTN (hypertension)   . GERD (gastroesophageal reflux disease)   . Depression     Patient Active Problem List   Diagnosis Date Noted  . HTN (hypertension) 09/23/2014  . GERD (gastroesophageal reflux disease) 09/23/2014  . CHF (congestive heart failure) 09/23/2014  . COPD exacerbation 08/11/2014    Past Surgical History  Procedure Laterality Date  . Tubal ligation Bilateral     Current Outpatient Rx  Name  Route  Sig  Dispense  Refill  . albuterol (PROVENTIL HFA;VENTOLIN HFA) 108 (90 BASE) MCG/ACT inhaler   Inhalation   Inhale 1 puff into the lungs every 6 (six) hours as needed for wheezing or shortness of breath.         Marland Kitchen albuterol (PROVENTIL) (2.5 MG/3ML) 0.083% nebulizer solution   Nebulization   Take 2.5 mg by nebulization every 4 (four) hours as needed for wheezing or shortness of breath.         Marland Kitchen amLODipine (NORVASC) 5 MG tablet   Oral   Take 5 mg by mouth daily.         . beclomethasone (QVAR) 80 MCG/ACT inhaler   Inhalation   Inhale 1 puff into the lungs 2 (two) times daily.         . carvedilol (COREG) 12.5 MG tablet   Oral   Take 1 tablet (12.5 mg total) by mouth 2 (two) times daily with a meal.   30 tablet   0   . enalapril (VASOTEC) 20 MG tablet   Oral   Take 20 mg by mouth 2 (two) times daily.          . famotidine (PEPCID) 20 MG tablet   Oral   Take 20 mg by mouth 2 (two) times daily.         . fluticasone (FLONASE) 50 MCG/ACT nasal spray   Each Nare   Place 1 spray into both nostrils daily.         . Fluticasone-Salmeterol (ADVAIR) 500-50 MCG/DOSE AEPB   Inhalation   Inhale 1 puff into the lungs 2 (two) times daily.         . hydrochlorothiazide (HYDRODIURIL) 25 MG tablet   Oral   Take 25 mg by mouth daily.         Marland Kitchen ipratropium (ATROVENT) 0.02 % nebulizer solution   Nebulization   Take 0.5 mg by nebulization 2 (two)  times daily as needed for wheezing or shortness of breath.         Marland Kitchen. ipratropium-albuterol (DUONEB) 0.5-2.5 (3) MG/3ML SOLN   Nebulization   Take 3 mLs by nebulization every 4 (four) hours as needed.         . loratadine (CLARITIN) 10 MG tablet   Oral   Take 10 mg by mouth daily.         Marland Kitchen. LORazepam (ATIVAN) 0.5 MG tablet   Oral   Take 1 tablet (0.5 mg total) by mouth every 8 (eight) hours as needed for anxiety.   21 tablet   0   . montelukast (SINGULAIR) 10 MG tablet   Oral   Take 10 mg by mouth every evening.         . nitroGLYCERIN (NITROSTAT) 0.4 MG SL tablet   Sublingual   Place 0.4 mg under the tongue every 5 (five) minutes as needed for chest pain.         . predniSONE (DELTASONE) 5 MG tablet   Oral   Take 4 tablets (20 mg total) by mouth daily with breakfast. 4 tab po daily for 2 days, then, 2 tab po for 2 days, then 1 tab for 2 days.   14 tablet   0   . roflumilast (DALIRESP)  500 MCG TABS tablet   Oral   Take 500 mcg by mouth daily.         Marland Kitchen. tiotropium (SPIRIVA) 18 MCG inhalation capsule   Inhalation   Place 18 mcg into inhaler and inhale daily.           Allergies Budesonide-formoterol fumarate  Family History  Problem Relation Age of Onset  . Cancer Mother   . Hypertension Mother   . Diabetes Mother   . Asthma Son     Social History History  Substance Use Topics  . Smoking status: Former Smoker -- 2.00 packs/day for 35 years    Types: Cigarettes  . Smokeless tobacco: Not on file  . Alcohol Use: Yes    Review of Systems Constitutional: No fever/chills Eyes: No visual changes. ENT: No sore throat. Cardiovascular: As above  Respiratory: As above Gastrointestinal: No abdominal pain.  No nausea, no vomiting.  No diarrhea.  No constipation. Genitourinary: Negative for dysuria. Musculoskeletal: Negative for back pain. Skin: Negative for rash. Neurological: Negative for headaches, focal weakness or numbness.  10-point ROS otherwise negative.  ____________________________________________   PHYSICAL EXAM:  VITAL SIGNS: ED Triage Vitals  Enc Vitals Group     BP --      Pulse Rate 10/06/14 2300 138     Resp 10/06/14 2300 30     Temp --      Temp src --      SpO2 10/06/14 2300 99 %     Weight --      Height --      Head Cir --      Peak Flow --      Pain Score --      Pain Loc --      Pain Edu? --      Excl. in GC? --     Constitutional: Alert and oriented. Wearing CPAP and in moderate distress.  Eyes: Conjunctivae are normal. PERRL. EOMI. Head: Atraumatic. Nose: No congestion/rhinnorhea. Mouth/Throat: Mucous membranes are moist.  Oropharynx non-erythematous. Neck: No stridor.   Cardiovascular: Tachycardic, regular rhythm. Grossly normal heart sounds.  Good peripheral circulation. Respiratory: Increased respiratory effort with tachypnea. Speaking in  full sentences. Tolerating BiPAP well after being transitioned from EMS  CPAP. Diffuse wheezing with prolonged expiratory phase as well as decreased air movement. Gastrointestinal: Soft and nontender. No distention. No abdominal bruits. No CVA tenderness. Musculoskeletal: No lower extremity tenderness nor edema.  No joint effusions. Neurologic:  Normal speech and language. No gross focal neurologic deficits are appreciated. Speech is normal. No gait instability. Skin:  Skin is warm, dry and intact. No rash noted. Psychiatric: Mood and affect are normal. Speech and behavior are normal.  ____________________________________________   LABS (all labs ordered are listed, but only abnormal results are displayed)  Labs Reviewed  CBC WITH DIFFERENTIAL/PLATELET  BASIC METABOLIC PANEL  TROPONIN I   ____________________________________________  EKG  ED ECG REPORT I, Schaevitz,  Teena Irani, the attending physician, personally viewed and interpreted this ECG.   Date: 10/06/2014  EKG Time: 2304  Rate: 143  Rhythm: sinus tachycardia  Axis: Normal axis  Intervals:none  ST&T Change: 1 mm ST depressions diffusely which is likely rate related.  ____________________________________________  RADIOLOGY  IMPRESSION: Strandy bibasilar airspace opacities could represent atelectasis with small suspected pleural effusions. Recommend PA and lateral views of the chest clinically able.  I personally viewed these images. ____________________________________________   PROCEDURES  CRITICAL CARE Performed by: Arelia Longest   Total critical care time: 45 minutes  Critical care time was exclusive of separately billable procedures and treating other patients.  Critical care was necessary to treat or prevent imminent or life-threatening deterioration.  Critical care was time spent personally by me on the following activities: development of treatment plan with patient and/or surrogate as well as nursing, discussions with consultants, evaluation of patient's response to  treatment, examination of patient, obtaining history from patient or surrogate, ordering and performing treatments and interventions, ordering and review of laboratory studies, ordering and review of radiographic studies, pulse oximetry and re-evaluation of patient's condition.   ____________________________________________   INITIAL IMPRESSION / ASSESSMENT AND PLAN / ED COURSE  Pertinent labs & imaging results that were available during my care of the patient were reviewed by me and considered in my medical decision making (see chart for details).  ----------------------------------------- 12:41 AM on 10/07/2014 -----------------------------------------  Patient tolerating BiPAP very well this time. Still with wheezing throughout. Prolonged respiratory phase. Require admission for further treatment of her COPD. Signed out to Dr. Anne Hahn. ____________________________________________   FINAL CLINICAL IMPRESSION(S) / ED DIAGNOSES  Acute COPD exacerbation. Return visit.    Myrna Blazer, MD 10/07/14 [phone]  Patient also with chest pain resolved at this time.  Myrna Blazer, MD 10/07/14 [phone]

## 2014-10-06 NOTE — ED Notes (Signed)
Pt now resting, bipap in place. Pt with improved breath sounds in all lobes. Pt initially with diminished breath sounds in all lobes with occasional scattered wheezing in all lobes. Pt with expiratory wheezing in rul, no wheezing in any other lobe. Pt with dry skin at this time, no longer clammy. Pt less agitated, relaxed, able to speak in full sentences through bipap mask.sinus tachycardia remains on monitor.

## 2014-10-07 ENCOUNTER — Encounter: Payer: Self-pay | Admitting: Internal Medicine

## 2014-10-07 DIAGNOSIS — Z79899 Other long term (current) drug therapy: Secondary | ICD-10-CM | POA: Diagnosis not present

## 2014-10-07 DIAGNOSIS — Z888 Allergy status to other drugs, medicaments and biological substances status: Secondary | ICD-10-CM | POA: Diagnosis not present

## 2014-10-07 DIAGNOSIS — Z7951 Long term (current) use of inhaled steroids: Secondary | ICD-10-CM | POA: Diagnosis not present

## 2014-10-07 DIAGNOSIS — F419 Anxiety disorder, unspecified: Secondary | ICD-10-CM

## 2014-10-07 DIAGNOSIS — Z809 Family history of malignant neoplasm, unspecified: Secondary | ICD-10-CM | POA: Diagnosis not present

## 2014-10-07 DIAGNOSIS — J9601 Acute respiratory failure with hypoxia: Secondary | ICD-10-CM | POA: Diagnosis present

## 2014-10-07 DIAGNOSIS — Z8249 Family history of ischemic heart disease and other diseases of the circulatory system: Secondary | ICD-10-CM | POA: Diagnosis not present

## 2014-10-07 DIAGNOSIS — K219 Gastro-esophageal reflux disease without esophagitis: Secondary | ICD-10-CM | POA: Diagnosis present

## 2014-10-07 DIAGNOSIS — Z833 Family history of diabetes mellitus: Secondary | ICD-10-CM | POA: Diagnosis not present

## 2014-10-07 DIAGNOSIS — F329 Major depressive disorder, single episode, unspecified: Secondary | ICD-10-CM | POA: Diagnosis present

## 2014-10-07 DIAGNOSIS — I509 Heart failure, unspecified: Secondary | ICD-10-CM | POA: Diagnosis present

## 2014-10-07 DIAGNOSIS — J9621 Acute and chronic respiratory failure with hypoxia: Secondary | ICD-10-CM | POA: Diagnosis present

## 2014-10-07 DIAGNOSIS — Z825 Family history of asthma and other chronic lower respiratory diseases: Secondary | ICD-10-CM | POA: Diagnosis not present

## 2014-10-07 DIAGNOSIS — I10 Essential (primary) hypertension: Secondary | ICD-10-CM | POA: Diagnosis present

## 2014-10-07 DIAGNOSIS — J441 Chronic obstructive pulmonary disease with (acute) exacerbation: Secondary | ICD-10-CM | POA: Diagnosis present

## 2014-10-07 DIAGNOSIS — Z7952 Long term (current) use of systemic steroids: Secondary | ICD-10-CM | POA: Diagnosis not present

## 2014-10-07 DIAGNOSIS — Z87891 Personal history of nicotine dependence: Secondary | ICD-10-CM | POA: Diagnosis not present

## 2014-10-07 LAB — CBC WITH DIFFERENTIAL/PLATELET
BASOS PCT: 1 %
Basophils Absolute: 0.1 10*3/uL (ref 0–0.1)
EOS ABS: 0.5 10*3/uL (ref 0–0.7)
Eosinophils Relative: 9 %
HCT: 39 % (ref 35.0–47.0)
Hemoglobin: 12.8 g/dL (ref 12.0–16.0)
Lymphocytes Relative: 43 %
Lymphs Abs: 2.5 10*3/uL (ref 1.0–3.6)
MCH: 33.2 pg (ref 26.0–34.0)
MCHC: 32.9 g/dL (ref 32.0–36.0)
MCV: 100.8 fL — ABNORMAL HIGH (ref 80.0–100.0)
Monocytes Absolute: 0.6 10*3/uL (ref 0.2–0.9)
Monocytes Relative: 9 %
NEUTROS PCT: 38 %
Neutro Abs: 2.3 10*3/uL (ref 1.4–6.5)
Platelets: 283 10*3/uL (ref 150–440)
RBC: 3.87 MIL/uL (ref 3.80–5.20)
RDW: 13.5 % (ref 11.5–14.5)
WBC: 5.9 10*3/uL (ref 3.6–11.0)

## 2014-10-07 LAB — BLOOD GAS, VENOUS
ACID-BASE EXCESS: 1.2 mmol/L (ref 0.0–3.0)
Bicarbonate: 29.8 mEq/L — ABNORMAL HIGH (ref 21.0–28.0)
FIO2: 0.4 %
Mechanical Rate: 10
Mode: POSITIVE
PH VEN: 7.27 — AB (ref 7.320–7.430)
Patient temperature: 37
pCO2, Ven: 65 mmHg — ABNORMAL HIGH (ref 44.0–60.0)

## 2014-10-07 LAB — BASIC METABOLIC PANEL WITH GFR
Anion gap: 8 (ref 5–15)
BUN: 14 mg/dL (ref 6–20)
CO2: 31 mmol/L (ref 22–32)
Calcium: 9.3 mg/dL (ref 8.9–10.3)
Chloride: 105 mmol/L (ref 101–111)
Creatinine, Ser: 0.87 mg/dL (ref 0.44–1.00)
GFR calc Af Amer: >60 mL/min
GFR calc non Af Amer: >60 mL/min
Glucose, Bld: 119 mg/dL — ABNORMAL HIGH (ref 65–99)
Potassium: 4.2 mmol/L (ref 3.5–5.1)
Sodium: 144 mmol/L (ref 135–145)

## 2014-10-07 LAB — TROPONIN I: Troponin I: <0.03 ng/mL

## 2014-10-07 LAB — MRSA PCR SCREENING: MRSA by PCR: NEGATIVE

## 2014-10-07 MED ORDER — MOMETASONE FURO-FORMOTEROL FUM 200-5 MCG/ACT IN AERO
2.0000 | INHALATION_SPRAY | Freq: Two times a day (BID) | RESPIRATORY_TRACT | Status: DC
  Administered 2014-10-07 – 2014-10-09 (×5): 2 via RESPIRATORY_TRACT
  Filled 2014-10-07: qty 8.8

## 2014-10-07 MED ORDER — ENOXAPARIN SODIUM 40 MG/0.4ML ~~LOC~~ SOLN
40.0000 mg | SUBCUTANEOUS | Status: DC
  Administered 2014-10-07 – 2014-10-08 (×2): 40 mg via SUBCUTANEOUS
  Filled 2014-10-07 (×2): qty 0.4

## 2014-10-07 MED ORDER — LORAZEPAM 0.5 MG PO TABS
0.5000 mg | ORAL_TABLET | Freq: Three times a day (TID) | ORAL | Status: DC | PRN
  Administered 2014-10-07 – 2014-10-09 (×6): 0.5 mg via ORAL
  Filled 2014-10-07 (×6): qty 1

## 2014-10-07 MED ORDER — CETYLPYRIDINIUM CHLORIDE 0.05 % MT LIQD
7.0000 mL | Freq: Two times a day (BID) | OROMUCOSAL | Status: DC
  Administered 2014-10-07 (×2): 7 mL via OROMUCOSAL

## 2014-10-07 MED ORDER — LORATADINE 10 MG PO TABS
10.0000 mg | ORAL_TABLET | Freq: Every day | ORAL | Status: DC
Start: 2014-10-07 — End: 2014-10-09
  Administered 2014-10-07 – 2014-10-09 (×3): 10 mg via ORAL
  Filled 2014-10-07 (×3): qty 1

## 2014-10-07 MED ORDER — IPRATROPIUM-ALBUTEROL 0.5-2.5 (3) MG/3ML IN SOLN
9.0000 mL | RESPIRATORY_TRACT | Status: DC | PRN
  Administered 2014-10-07: 3 mL via RESPIRATORY_TRACT
  Administered 2014-10-07: 9 mL via RESPIRATORY_TRACT
  Administered 2014-10-07 – 2014-10-08 (×3): 3 mL via RESPIRATORY_TRACT
  Filled 2014-10-07 (×5): qty 9

## 2014-10-07 MED ORDER — CARVEDILOL 12.5 MG PO TABS
12.5000 mg | ORAL_TABLET | Freq: Two times a day (BID) | ORAL | Status: DC
  Administered 2014-10-07 – 2014-10-09 (×5): 12.5 mg via ORAL
  Filled 2014-10-07 (×5): qty 1

## 2014-10-07 MED ORDER — ONDANSETRON HCL 4 MG/2ML IJ SOLN: 4.0000 mg | Freq: Four times a day (QID) | INTRAMUSCULAR | Status: DC | PRN

## 2014-10-07 MED ORDER — ROFLUMILAST 500 MCG PO TABS
500.0000 ug | ORAL_TABLET | Freq: Every day | ORAL | Status: DC
  Administered 2014-10-07 – 2014-10-09 (×3): 500 ug via ORAL
  Filled 2014-10-07 (×3): qty 1

## 2014-10-07 MED ORDER — CHLORHEXIDINE GLUCONATE 0.12 % MT SOLN
15.0000 mL | Freq: Two times a day (BID) | OROMUCOSAL | Status: DC
Start: 2014-10-07 — End: 2014-10-09
  Administered 2014-10-07 – 2014-10-08 (×3): 15 mL via OROMUCOSAL

## 2014-10-07 MED ORDER — ACETAMINOPHEN 650 MG RE SUPP
650.0000 mg | Freq: Four times a day (QID) | RECTAL | Status: DC | PRN
Start: 2014-10-07 — End: 2014-10-09

## 2014-10-07 MED ORDER — MORPHINE SULFATE 4 MG/ML IJ SOLN
INTRAMUSCULAR | Status: AC
  Filled 2014-10-07: qty 1

## 2014-10-07 MED ORDER — DEXTROSE 5 % IV SOLN
500.0000 mg | INTRAVENOUS | Status: DC
  Administered 2014-10-08: 500 mg via INTRAVENOUS
  Filled 2014-10-07 (×3): qty 500

## 2014-10-07 MED ORDER — TIOTROPIUM BROMIDE MONOHYDRATE 18 MCG IN CAPS
18.0000 ug | ORAL_CAPSULE | Freq: Every day | RESPIRATORY_TRACT | Status: DC
  Administered 2014-10-07 – 2014-10-09 (×3): 18 ug via RESPIRATORY_TRACT
  Filled 2014-10-07: qty 5

## 2014-10-07 MED ORDER — SODIUM CHLORIDE 0.9 % IJ SOLN
3.0000 mL | Freq: Two times a day (BID) | INTRAMUSCULAR | Status: DC
  Administered 2014-10-07 – 2014-10-08 (×3): 3 mL via INTRAVENOUS

## 2014-10-07 MED ORDER — SODIUM CHLORIDE 0.9 % IV SOLN
INTRAVENOUS | Status: DC
  Administered 2014-10-07: 04:00:00 via INTRAVENOUS

## 2014-10-07 MED ORDER — METHYLPREDNISOLONE SODIUM SUCC 40 MG IJ SOLR
40.0000 mg | Freq: Four times a day (QID) | INTRAMUSCULAR | Status: DC
  Administered 2014-10-07 – 2014-10-09 (×7): 40 mg via INTRAVENOUS
  Filled 2014-10-07 (×7): qty 1

## 2014-10-07 MED ORDER — ONDANSETRON HCL 4 MG PO TABS: 4.0000 mg | ORAL_TABLET | Freq: Four times a day (QID) | ORAL | Status: DC | PRN

## 2014-10-07 MED ORDER — MONTELUKAST SODIUM 10 MG PO TABS
10.0000 mg | ORAL_TABLET | Freq: Every evening | ORAL | Status: DC
  Administered 2014-10-07 – 2014-10-08 (×2): 10 mg via ORAL
  Filled 2014-10-07 (×2): qty 1

## 2014-10-07 MED ORDER — METHYLPREDNISOLONE SODIUM SUCC 125 MG IJ SOLR
60.0000 mg | Freq: Four times a day (QID) | INTRAMUSCULAR | Status: DC
  Administered 2014-10-07 (×2): 60 mg via INTRAVENOUS
  Filled 2014-10-07 (×2): qty 2

## 2014-10-07 MED ORDER — SENNOSIDES-DOCUSATE SODIUM 8.6-50 MG PO TABS: 1.0000 | ORAL_TABLET | Freq: Every evening | ORAL | Status: DC | PRN

## 2014-10-07 MED ORDER — ACETAMINOPHEN 325 MG PO TABS
650.0000 mg | ORAL_TABLET | Freq: Four times a day (QID) | ORAL | Status: DC | PRN
Start: 2014-10-07 — End: 2014-10-09

## 2014-10-07 NOTE — ED Notes (Signed)
Pt sleeping, head of bed 45 degrees, breath sounds unchanged. Pt arouses to verbal stiumli, denies pain currently. Hr improving.

## 2014-10-07 NOTE — ED Notes (Signed)
Delay in taking pt to floor due to room cleaning

## 2014-10-07 NOTE — Progress Notes (Signed)
Patient ID: Julie BunSylvia L Bordeau, female   DOB: 05/23/1955, 59 y.o.   MRN: [id number] [facility] PROGRESS NOTE  HPI/Subjective: Patient is feeling better than when she came in. She believes some marijuana smell was coming through the events of her house from another apartment. She tried using her nebulizers which did not work. In coughing up some thick whitish phlegm and having some sweats.  Objective: Filed Vitals:   10/07/14 1200  BP: 110/90  Pulse: 91  Temp:   Resp: 24    Intake/Output Summary (Last 24 hours) at 10/07/14 1301 Last data filed at 10/07/14 1200  Gross per 24 hour  Intake   1110 ml  Output    650 ml  Net    460 ml   Filed Weights   10/07/14 0600  Weight: 80.3 kg (177 lb 0.5 oz)    ROS: Review of Systems  Constitutional: Negative for fever and chills.  Eyes: Negative for blurred vision.  Respiratory: Positive for cough, sputum production and shortness of breath.   Cardiovascular: Negative for chest pain.  Gastrointestinal: Negative for nausea, vomiting, abdominal pain, diarrhea and constipation.  Genitourinary: Negative for dysuria.  Musculoskeletal: Negative for joint pain.  Neurological: Negative for dizziness and headaches.   Exam: Physical Exam  Constitutional: She is oriented to person, place, and time.  HENT:  Nose: No mucosal edema.  Mouth/Throat: No oropharyngeal exudate or posterior oropharyngeal edema.  Eyes: Conjunctivae, EOM and lids are normal. Pupils are equal, round, and reactive to light.  Neck: No JVD present. Carotid bruit is not present. No edema present. No thyroid mass and no thyromegaly present.  Cardiovascular: S1 normal and S2 normal.  Exam reveals no gallop.   No murmur heard. Pulses:      Dorsalis pedis pulses are 2+ on the right side, and 2+ on the left side.  Respiratory: No respiratory distress. She has decreased breath sounds in the right middle field, the right lower field, the left middle field and the left  lower field. She has wheezes in the right lower field and the left lower field. She has no rhonchi. She has no rales.  GI: Soft. Bowel sounds are normal. There is no tenderness.  Musculoskeletal:       Right ankle: She exhibits swelling.       Left ankle: She exhibits swelling.  Lymphadenopathy:    She has no cervical adenopathy.  Neurological: She is alert and oriented to person, place, and time. No cranial nerve deficit.  Skin: Skin is warm. No rash noted. Nails show no clubbing.  Psychiatric: She has a normal mood and affect.    Data Reviewed: Basic Metabolic Panel:  Recent Labs Lab 10/06/14 2319  NA 144  K 4.2  CL 105  CO2 31  GLUCOSE 119*  Foley 14  CREATININE 0.87  CALCIUM 9.3   CBC:  Recent Labs Lab 10/06/14 2319  WBC 5.9  NEUTROABS 2.3  HGB 12.8  HCT 39.0  MCV 100.8*  PLT 283   Cardiac Enzymes:  Recent Labs Lab 10/06/14 2319  TROPONINI <0.03   BNP (last 3 results)  Recent Labs  09/22/14 2315  BNP 15.0   Studies: Dg Chest 1 View  10/06/2014   CLINICAL DATA:  Dyspnea.  History of CHF, COPD and hypertension.  EXAM: CHEST  1 VIEW  COMPARISON:  Chest radiograph September 22, 2014  FINDINGS: Cardiomediastinal silhouette is unremarkable. Strandy densities bilateral lung bases. Mild blunting of the costophrenic angles. Increased lung volumes compatible  with history of COPD. No pneumothorax. Soft tissue planes and included osseous structure nonsuspicious.  IMPRESSION: Strandy bibasilar airspace opacities could represent atelectasis with small suspected pleural effusions. Recommend PA and lateral views of the chest clinically able.   Electronically Signed   By: Awilda Metro M.D.   On: 10/06/2014 23:42    Scheduled Meds: . antiseptic oral rinse  7 mL Mouth Rinse q12n4p  . azithromycin (ZITHROMAX) 500 MG IVPB  500 mg Intravenous Q24H  . carvedilol  12.5 mg Oral BID WC  . chlorhexidine  15 mL Mouth Rinse BID  . enoxaparin (LOVENOX) injection  40 mg Subcutaneous  Q24H  . loratadine  10 mg Oral Daily  . methylPREDNISolone (SOLU-MEDROL) injection  40 mg Intravenous Q6H  . mometasone-formoterol  2 puff Inhalation BID  . montelukast  10 mg Oral QPM  . roflumilast  500 mcg Oral Daily  . sodium chloride  3 mL Intravenous Q12H  . tiotropium  18 mcg Inhalation Daily   Continuous Infusions:   Assessment/Plan:  1. Acute on chronic respiratory failure. The patient required BiPAP initially on presentation now down to her usual nasal cannula 4 L. I will transfer out of the ICU. 2. COPD exacerbation. This could be due to an irritant/exposure to marijuana from a neighboring apartment. I will decrease Solu-Medrol to 40 mg IV every 6 hours continue nebulizer treatments. The patient does have chronic respiratory failure and she is Roflumilast. 3. Essential hypertension- blood pressure on the lower side continue Coreg. 4. History of congestive heart failure but the patient does not believe so.  I will stop the patient's IV fluids. 5. Anxiety and depression continue Ativan when necessary.  Code Status:     Code Status Orders        Start     Ordered   10/07/14 0355  Full code   Continuous     10/07/14 0354     Family Communication: Family at bedside. Disposition Plan: Evaluation on a day-to-day basis to see when to go home.  Time spent: 25 minutes  Alford Highland  [facility]

## 2014-10-07 NOTE — Plan of Care (Signed)
Problem: ICU Phase Progression Outcomes Goal: O2 sats trending toward baseline Outcome: Progressing On nasal cannula majority of day. Becomes labored and uses BIPAP prn. States she is not back to her normal. Goal: Dyspnea controlled at rest Outcome: Progressing But remains dyspneic at intervals requiring bipap Goal: Initial discharge plan identified Outcome: Progressing Earlier had wanted to leave AMA.  But now content to spend another day of treatment  Problem: Phase I Progression Outcomes Goal: Pt OOB to Walk or Exercise Daily With Nursing or PT Patient OOB to walk or exercise daily with nursing or PT if activity order permits  Outcome: Progressing Up in room.  Dyspnea with exertion Goal: Tolerating diet Outcome: Progressing Good diet intake

## 2014-10-07 NOTE — ED Notes (Signed)
RT in to draw blood gas.

## 2014-10-07 NOTE — ED Notes (Signed)
Pt resting, resps improved, pt continues with tachycardia, but rate improved. skinwarm and dry, normal color.

## 2014-10-07 NOTE — H&P (Addendum)
[facility] [place] [facility]   PATIENT NAME: Julie KnockSylvia Foley    MR#:  [id number]  DATE OF BIRTH:  [birth year]  DATE OF ADMISSION:  10/07/2014  PRIMARY CARE PHYSICIAN: No PCP Per Patient   REQUESTING/REFERRING PHYSICIAN: schaevitz  CHIEF COMPLAINT:   Chief Complaint  Patient presents with  . Shortness of Breath    HISTORY OF PRESENT ILLNESS:  Julie KnockSylvia Foley  is a 59 y.o. female who presents with acute respiratory failure due to COPD exacerbation. Patient states that she feels like some people in one of the apartments above her were smoking marijuana, she feels like she was sensing that coming to her air ducts. She states that this began to exacerbate her, and she went outside for fresh air and came back in but it only got worse, despite using her inhalers for shortness of breath progressed until she needed to come in to the ED. She has a cough and wheezing.  She denies any fever or chills, she has white sputum production, and her breathing was increased on arrival ED, with her O2 sats were low requiring BiPAP. Hospitalists were called for admission for COPD exacerbation causing acute respiratory failure with hypoxemia.  PAST MEDICAL HISTORY:   Past Medical History  Diagnosis Date  . COPD (chronic obstructive pulmonary disease)   . CHF (congestive heart failure)   . HTN (hypertension)   . GERD (gastroesophageal reflux disease)   . Depression   . Anxiety     PAST SURGICAL HISTORY:   Past Surgical History  Procedure Laterality Date  . Tubal ligation Bilateral     SOCIAL HISTORY:   History  Substance Use Topics  . Smoking status: Former Smoker -- 2.00 packs/day for 35 years    Types: Cigarettes  . Smokeless tobacco: Not on file  . Alcohol Use: Yes    FAMILY HISTORY:   Family History  Problem Relation Age of Onset  . Cancer Mother   . Hypertension Mother   . Diabetes Mother   . Asthma Son     DRUG ALLERGIES:   Allergies  Allergen  Reactions  . Budesonide-Formoterol Fumarate Swelling    MEDICATIONS AT HOME:   Prior to Admission medications   Medication Sig Start Date End Date Taking? Authorizing Provider  albuterol (PROVENTIL HFA;VENTOLIN HFA) 108 (90 BASE) MCG/ACT inhaler Inhale 1 puff into the lungs every 6 (six) hours as needed for wheezing or shortness of breath.    Historical Provider, MD  albuterol (PROVENTIL) (2.5 MG/3ML) 0.083% nebulizer solution Take 2.5 mg by nebulization every 4 (four) hours as needed for wheezing or shortness of breath.    Historical Provider, MD  amLODipine (NORVASC) 5 MG tablet Take 5 mg by mouth daily.    Historical Provider, MD  beclomethasone (QVAR) 80 MCG/ACT inhaler Inhale 1 puff into the lungs 2 (two) times daily.    Historical Provider, MD  carvedilol (COREG) 12.5 MG tablet Take 1 tablet (12.5 mg total) by mouth 2 (two) times daily with a meal. 08/13/14   Sital Mody, MD  enalapril (VASOTEC) 20 MG tablet Take 20 mg by mouth 2 (two) times daily.     Historical Provider, MD  famotidine (PEPCID) 20 MG tablet Take 20 mg by mouth 2 (two) times daily.    Historical Provider, MD  fluticasone (FLONASE) 50 MCG/ACT nasal spray Place 1 spray into both nostrils daily.    Historical Provider, MD  Fluticasone-Salmeterol (ADVAIR) 500-50 MCG/DOSE AEPB Inhale 1 puff into the lungs 2 (  two) times daily.    Historical Provider, MD  hydrochlorothiazide (HYDRODIURIL) 25 MG tablet Take 25 mg by mouth daily.    Historical Provider, MD  ipratropium (ATROVENT) 0.02 % nebulizer solution Take 0.5 mg by nebulization 2 (two) times daily as needed for wheezing or shortness of breath.    Historical Provider, MD  ipratropium-albuterol (DUONEB) 0.5-2.5 (3) MG/3ML SOLN Take 3 mLs by nebulization every 4 (four) hours as needed.    Historical Provider, MD  loratadine (CLARITIN) 10 MG tablet Take 10 mg by mouth daily.    Historical Provider, MD  LORazepam (ATIVAN) 0.5 MG tablet Take 1 tablet (0.5 mg total) by mouth every 8  (eight) hours as needed for anxiety. 09/23/14   Shaune Pollack, MD  montelukast (SINGULAIR) 10 MG tablet Take 10 mg by mouth every evening.    Historical Provider, MD  nitroGLYCERIN (NITROSTAT) 0.4 MG SL tablet Place 0.4 mg under the tongue every 5 (five) minutes as needed for chest pain.    Historical Provider, MD  predniSONE (DELTASONE) 5 MG tablet Take 4 tablets (20 mg total) by mouth daily with breakfast. 4 tab po daily for 2 days, then, 2 tab po for 2 days, then 1 tab for 2 days. 09/23/14   Shaune Pollack, MD  roflumilast (DALIRESP) 500 MCG TABS tablet Take 500 mcg by mouth daily.    Historical Provider, MD  tiotropium (SPIRIVA) 18 MCG inhalation capsule Place 18 mcg into inhaler and inhale daily.    Historical Provider, MD    REVIEW OF SYSTEMS:  Review of Systems  Constitutional: Negative for fever, chills, weight loss and malaise/fatigue.  HENT: Negative for ear pain, hearing loss and tinnitus.   Eyes: Negative for blurred vision, double vision, pain and redness.  Respiratory: Positive for cough, sputum production, shortness of breath and wheezing. Negative for hemoptysis.   Cardiovascular: Negative for chest pain, palpitations, orthopnea and leg swelling.  Gastrointestinal: Negative for nausea, vomiting, abdominal pain, diarrhea and constipation.  Genitourinary: Negative for dysuria, frequency and hematuria.  Musculoskeletal: Negative for back pain, joint pain and neck pain.  Skin:       No acne, rash, or lesions  Neurological: Negative for dizziness, tremors, focal weakness and weakness.  Endo/Heme/Allergies: Negative for polydipsia. Does not bruise/bleed easily.  Psychiatric/Behavioral: Negative for depression. The patient is not nervous/anxious and does not have insomnia.      VITAL SIGNS:   Filed Vitals:   10/07/14 0030 10/07/14 0045 10/07/14 0100 10/07/14 0115  BP: 111/54     Pulse: 125 123  120  Temp:      TempSrc:      Resp: 16 16 27 16   SpO2: 100% 100%  100%   Wt Readings from  Last 3 Encounters:  09/22/14 72.576 kg (160 lb)  08/13/14 75.297 kg (166 lb)    PHYSICAL EXAMINATION:  Physical Exam  Constitutional: She is oriented to person, place, and time. She appears well-developed and well-nourished. No distress.  HENT:  Head: Normocephalic and atraumatic.  Mouth/Throat: Oropharynx is clear and moist.  Eyes: Conjunctivae and EOM are normal. Pupils are equal, round, and reactive to light. No scleral icterus.  Neck: Normal range of motion. Neck supple. No JVD present. No thyromegaly present.  Cardiovascular: Normal rate, regular rhythm and intact distal pulses.  Exam reveals no gallop and no friction rub.   No murmur heard. Respiratory: She is in respiratory distress. She has wheezes (bilateral). She has no rales.  GI: Soft. Bowel sounds are normal. She exhibits  no distension. There is no tenderness.  Musculoskeletal: Normal range of motion. She exhibits no edema.  No arthritis, no gout  Lymphadenopathy:    She has no cervical adenopathy.  Neurological: She is alert and oriented to person, place, and time. No cranial nerve deficit.  No dysarthria, no aphasia  Skin: Skin is warm and dry. No rash noted. No erythema.  Psychiatric: She has a normal mood and affect. Her behavior is normal. Judgment and thought content normal.    LABORATORY PANEL:   CBC  Recent Labs Lab 10/06/14 2319  WBC 5.9  HGB 12.8  HCT 39.0  PLT 283   ------------------------------------------------------------------------------------------------------------------  Chemistries   Recent Labs Lab 10/06/14 2319  NA 144  K 4.2  CL 105  CO2 31  GLUCOSE 119*  BUN 14  CREATININE 0.87  CALCIUM 9.3   ------------------------------------------------------------------------------------------------------------------  Cardiac Enzymes  Recent Labs Lab 10/06/14 2319  TROPONINI <0.03    ------------------------------------------------------------------------------------------------------------------  RADIOLOGY:  Dg Chest 1 View  10/06/2014   CLINICAL DATA:  Dyspnea.  History of CHF, COPD and hypertension.  EXAM: CHEST  1 VIEW  COMPARISON:  Chest radiograph September 22, 2014  FINDINGS: Cardiomediastinal silhouette is unremarkable. Strandy densities bilateral lung bases. Mild blunting of the costophrenic angles. Increased lung volumes compatible with history of COPD. No pneumothorax. Soft tissue planes and included osseous structure nonsuspicious.  IMPRESSION: Strandy bibasilar airspace opacities could represent atelectasis with small suspected pleural effusions. Recommend PA and lateral views of the chest clinically able.   Electronically Signed   By: Awilda Metro M.D.   On: 10/06/2014 23:42    EKG:   Orders placed or performed during the hospital encounter of 09/22/14  . ED EKG  . ED EKG  . EKG 12-Lead  . EKG 12-Lead  . EKG 12-Lead  . EKG 12-Lead  . EKG 12-Lead  . EKG 12-Lead  . EKG    IMPRESSION AND PLAN:  Principal Problem:   Acute respiratory failure with hypoxemia - due to her COPD exacerbation. She is currently on BiPAP, we will continue this until she is able to wean off of it is good O2 sats. She got COPD exacerbation treatment as below. Active Problems:   COPD exacerbation - got IV Solu-Medrol, duo nebs, and azithromycin in the ED. We'll continue these medications for now, with a goal of weaning her IV steroids as her breathing improves.   HTN (hypertension) - chronic problem, continue home meds   CHF (congestive heart failure) - chronic problem, stable for now, continue home meds   GERD (gastroesophageal reflux disease) - equivalent home dose PPI   Anxiety - stable, home meds  All the records are reviewed and case discussed with ED provider. Management plans discussed with the patient and/or family.  DVT PROPHYLAXIS: SubQ lovenox  ADMISSION STATUS:  Inpatient  CODE STATUS: Full  TOTAL TIME TAKING CARE OF THIS PATIENT: 45 minutes.    Trenna Kiely FIELDING 10/07/2014, 1:24 AM  [facility]  Office  [phone]  CC: Primary care physician; No PCP Per Patient

## 2014-10-08 MED ORDER — AZITHROMYCIN 250 MG PO TABS
500.0000 mg | ORAL_TABLET | Freq: Every day | ORAL | Status: DC
  Administered 2014-10-08: 500 mg via ORAL
  Filled 2014-10-08: qty 2

## 2014-10-08 NOTE — Progress Notes (Signed)
PHARMACIST - PHYSICIAN COMMUNICATION  CONCERNING: Antibiotic IV to Oral Route Change Policy  RECOMMENDATION: This patient is receiving azithromycin by the intravenous route.  Based on criteria approved by the Pharmacy and Therapeutics Committee, the antibiotic(s) is/are being converted to the equivalent oral dose form(s).   DESCRIPTION: These criteria include:  Patient being treated for a respiratory tract infection, urinary tract infection, cellulitis or clostridium difficile associated diarrhea if on metronidazole  The patient is not neutropenic and does not exhibit a GI malabsorption state  The patient is eating (either orally or via tube) and/or has been taking other orally administered medications for a least 24 hours  The patient is improving clinically and has a Tmax < 100.5  If you have questions about this conversion, please contact the Pharmacy Department  []   [phone]( [phone] )  [facility] [x]   [phone]( [phone] )  [facility] []   [phone]( [phone] )  [facility] []   [phone]( [phone] )  [facility] []   [phone]( [phone] )  [facility]    Clarisa Schoolsrystal Elverda Wendel, PharmD Clinical Pharmacist 10/08/2014

## 2014-10-08 NOTE — Evaluation (Signed)
Physical Therapy Evaluation Patient Details Name: Julie Foley MRN: [id number] DOB: January 01, 1956 Today's Date: 10/08/2014   History of Present Illness  Pt here with COPD exacerbation, hypoxia  Clinical Impression  Pt is able to ambulate well with 4 liters o2 and no AD, she is also effective and safe on steps w/o assist.  Pt has some L ankle DF weakness, but this does not seem to limit or alter her ambulation.     Follow Up Recommendations No PT follow up    Equipment Recommendations   [facility] of episodes of signficant weakness)    Recommendations for Other Services       Precautions / Restrictions Precautions Precautions: Fall Restrictions Weight Bearing Restrictions: No      Mobility  Bed Mobility Overal bed mobility: Independent                Transfers Overall transfer level: Independent Equipment used: None                Ambulation/Gait Ambulation/Gait assistance: Modified independent (Device/Increase time) Ambulation Distance (Feet): 250 Feet Assistive device:  (4 liters O2, sats remain in mid 90s)       General Gait Details: Pt walks slowly but is good confidence and she reports she is at/near her baseline  Stairs Stairs: Yes Stairs assistance: Modified independent (Device/Increase time) Stair Management: One rail Left Number of Stairs: 4 General stair comments: Pt safe with stair negotiation  Wheelchair Mobility    Modified Rankin (Stroke Patients Only)       Balance                                             Pertinent Vitals/Pain Pain Assessment: No/denies pain    Home Living Family/patient expects to be discharged to:: Private residence Living Arrangements: Spouse/significant other Available Help at Discharge: Family   Home Access: Stairs to enter   Secretary/administratorntrance Stairs-Number of Steps: 4          Prior Function Level of Independence: Independent               Hand Dominance       Extremity/Trunk Assessment   Upper Extremity Assessment: Overall WFL for tasks assessed           Lower Extremity Assessment: Overall WFL for tasks assessed (except L foot DF 3/5)         Communication   Communication: No difficulties  Cognition Arousal/Alertness: Awake/alert Behavior During Therapy: WFL for tasks assessed/performed Overall Cognitive Status: Within Functional Limits for tasks assessed                      General Comments      Exercises        Assessment/Plan    PT Assessment Patent does not need any further PT services  PT Diagnosis Difficulty walking   PT Problem List    PT Treatment Interventions     PT Goals (Current goals can be found in the Care Plan section) Acute Rehab PT Goals Patient Stated Goal: "Go back home"    Frequency     Barriers to discharge        Co-evaluation               End of Session Equipment Utilized During Treatment: Gait belt Activity Tolerance: Patient tolerated treatment  well (minimal fatigue) Patient left: with bed alarm set           Time: 1240-1302 PT Time Calculation (min) (ACUTE ONLY): 22 min   Charges:   PT Evaluation $Initial PT Evaluation Tier I: 1 Procedure     PT G Codes:       Julie Foley, PT, DPT [phone]  Malachi Pro 10/08/2014, 4:39 PM

## 2014-10-08 NOTE — Progress Notes (Signed)
Patient ID: Julie Foley, female   DOB: [birth year], 57 y.o.   MRN: [id number]   [facility]  HPI/Subjective: The patient had a rough night with her sleep. She required to go back on the BiPAP last night. This morning doesn't feel like her breathing is back to her usual self and she is scared about going home today. She is still wheezing. And still short of breath.  Objective: Filed Vitals:   10/08/14 0727  BP: 117/61  Pulse: 72  Temp: 97.9 F (36.6 C)  Resp: 19    Intake/Output Summary (Last 24 hours) at 10/08/14 0819 Last data filed at 10/07/14 1946  Gross per 24 hour  Intake   1230 ml  Output   1301 ml  Net    -71 ml   Filed Weights   10/07/14 0600 10/07/14 1753  Weight: 80.3 kg (177 lb 0.5 oz) 76.658 kg (169 lb)    ROS: Review of Systems  Constitutional: Negative for fever and chills.  Eyes: Negative for blurred vision.  Respiratory: Positive for cough, sputum production and shortness of breath.   Cardiovascular: Negative for chest pain.  Gastrointestinal: Negative for nausea, vomiting, abdominal pain, diarrhea and constipation.  Genitourinary: Negative for dysuria.  Musculoskeletal: Negative for joint pain.  Neurological: Negative for dizziness and headaches.   Exam: Physical Exam  Constitutional: She is oriented to person, place, and time.  HENT:  Nose: No mucosal edema.  Mouth/Throat: No oropharyngeal exudate or posterior oropharyngeal edema.  Eyes: Conjunctivae, EOM and lids are normal. Pupils are equal, round, and reactive to light.  Neck: No JVD present. Carotid bruit is not present. No edema present. No thyroid mass and no thyromegaly present.  Cardiovascular: S1 normal and S2 normal.  Exam reveals no gallop.   No murmur heard. Pulses:      Dorsalis pedis pulses are 2+ on the right side, and 2+ on the left side.  Respiratory: No respiratory distress. She has decreased breath sounds in the right lower field and the left lower  field. She has wheezes in the right upper field and the left upper field. She has no rhonchi. She has no rales.  GI: Soft. Bowel sounds are normal. There is no tenderness.  Musculoskeletal:       Right ankle: She exhibits swelling.       Left ankle: She exhibits swelling.  Lymphadenopathy:    She has no cervical adenopathy.  Neurological: She is alert and oriented to person, place, and time. No cranial nerve deficit.  Skin: Skin is warm. No rash noted. Nails show no clubbing.  Psychiatric: She has a normal mood and affect.    Data Reviewed: Basic Metabolic Panel:  Recent Labs Lab 10/06/14 2319  NA 144  K 4.2  CL 105  CO2 31  GLUCOSE 119*  BUN 14  CREATININE 0.87  CALCIUM 9.3   CBC:  Recent Labs Lab 10/06/14 2319  WBC 5.9  NEUTROABS 2.3  HGB 12.8  HCT 39.0  MCV 100.8*  PLT 283   Cardiac Enzymes:  Recent Labs Lab 10/06/14 2319  TROPONINI <0.03   BNP (last 3 results)  Recent Labs  09/22/14 2315  BNP 15.0   Studies: Dg Chest 1 View  10/06/2014   CLINICAL DATA:  Dyspnea.  History of CHF, COPD and hypertension.  EXAM: CHEST  1 VIEW  COMPARISON:  Chest radiograph September 22, 2014  FINDINGS: Cardiomediastinal silhouette is unremarkable. Strandy densities bilateral lung bases. Mild blunting of the  costophrenic angles. Increased lung volumes compatible with history of COPD. No pneumothorax. Soft tissue planes and included osseous structure nonsuspicious.  IMPRESSION: Strandy bibasilar airspace opacities could represent atelectasis with small suspected pleural effusions. Recommend PA and lateral views of the chest clinically able.   Electronically Signed   By: Awilda Metro M.D.   On: 10/06/2014 23:42    Scheduled Meds: . antiseptic oral rinse  7 mL Mouth Rinse q12n4p  . azithromycin (ZITHROMAX) 500 MG IVPB  500 mg Intravenous Q24H  . carvedilol  12.5 mg Oral BID WC  . chlorhexidine  15 mL Mouth Rinse BID  . enoxaparin (LOVENOX) injection  40 mg Subcutaneous Q24H   . loratadine  10 mg Oral Daily  . methylPREDNISolone (SOLU-MEDROL) injection  40 mg Intravenous Q6H  . mometasone-formoterol  2 puff Inhalation BID  . montelukast  10 mg Oral QPM  . roflumilast  500 mcg Oral Daily  . sodium chloride  3 mL Intravenous Q12H  . tiotropium  18 mcg Inhalation Daily    Assessment/Plan:  1. Acute on chronic respiratory failure. The patient required BiPAP last night to breathe. Currently on nasal cannula 4 L. 2. COPD exacerbation. This could be due to an irritant/exposure to marijuana from a neighboring apartment. Continue Solu-Medrol to 40 mg IV every 6 hours and continue nebulizer treatments. The patient does have chronic respiratory failure and she is Roflumilast. 3. Essential hypertension- blood pressure on the lower side continue Coreg. 4. History of congestive heart failure but the patient does not believe so. No signs of heart failure currently 5. Anxiety and depression continue Ativan when necessary.  Code Status:     Code Status Orders        Start     Ordered   10/07/14 0355  Full code   Continuous     10/07/14 0354     Family Communication: Family at bedside. Disposition Plan: Possibly tomorrow.  Time spent: 20 minutes  Alford Highland  [facility]

## 2014-10-09 MED ORDER — AZITHROMYCIN 250 MG PO TABS: ORAL_TABLET | ORAL | Status: DC

## 2014-10-09 MED ORDER — PREDNISONE 20 MG PO TABS: 40.0000 mg | ORAL_TABLET | Freq: Every day | ORAL | Status: DC

## 2014-10-09 MED ORDER — PREDNISONE 20 MG PO TABS
40.0000 mg | ORAL_TABLET | Freq: Every day | ORAL | Status: DC
  Administered 2014-10-09: 40 mg via ORAL
  Filled 2014-10-09: qty 2

## 2014-10-09 MED ORDER — IPRATROPIUM-ALBUTEROL 0.5-2.5 (3) MG/3ML IN SOLN
3.0000 mL | RESPIRATORY_TRACT | Status: DC | PRN
  Administered 2014-10-09: 3 mL via RESPIRATORY_TRACT
  Filled 2014-10-09: qty 3

## 2014-10-09 MED ORDER — LORAZEPAM 0.5 MG PO TABS: 0.5000 mg | ORAL_TABLET | Freq: Three times a day (TID) | ORAL | Status: DC | PRN

## 2014-10-09 NOTE — Discharge Summary (Signed)
[facility] [place] [facility]   PATIENT NAME: Julie KnockSylvia Foley    MR#:  [id number]  DATE OF BIRTH:  November 21, 1955  DATE OF ADMISSION:  10/06/2014 ADMITTING PHYSICIAN: Oralia Manisavid Willis, MD  DATE OF DISCHARGE: 10/09/2014  [facility]   ADMISSION DIAGNOSIS:  COPD exacerbation [J44.1]  DISCHARGE DIAGNOSIS:  Principal Problem:   Acute respiratory failure with hypoxemia Active Problems:   COPD exacerbation   HTN (hypertension)   GERD (gastroesophageal reflux disease)   CHF (congestive heart failure)   Anxiety   SECONDARY DIAGNOSIS:   Past Medical History  Diagnosis Date  . COPD (chronic obstructive pulmonary disease)   . CHF (congestive heart failure)   . HTN (hypertension)   . GERD (gastroesophageal reflux disease)   . Depression   . Anxiety     HOSPITAL COURSE:   1. For the patient's acute on chronic respiratory failure initially the patient required BiPAP in order to oxygenate. Patient was switched over to her normal nasal cannula 4 L. 2. COPD exacerbation- patient was started on high-dose Solu-Medrol taper down to medium dose Solu-Medrol and then over to prednisone taper. Zithromax will be prescribed also. She has all her usual inhalers and nebulizers at home. 3. Hypertension essential- blood pressure stable. During the hospital course we did not prescribe the Norvasc or hydrochlorothiazide. Child DC these medications. 4. History of congestive heart failure- currently no signs. 5. Depression and anxiety- I had to write a small prescription for Ativan. 6. Gastroesophageal reflux disease without esophagitis- continue famotidine. 7. I recommended the patient get an outpatient sleep study to see if she has sleep apnea.  DISCHARGE CONDITIONS:   Satisfactory  CONSULTS OBTAINED:   None  DRUG ALLERGIES:   Allergies  Allergen Reactions  . Budesonide-Formoterol Fumarate Swelling    DISCHARGE MEDICATIONS:   Current  Discharge Medication List    START taking these medications   Details  azithromycin (ZITHROMAX) 250 MG tablet One tab daily for three days Qty: 3 each, Refills: 0    predniSONE (DELTASONE) 20 MG tablet Take 2 tablets (40 mg total) by mouth daily with breakfast. Qty: 8 tablet, Refills: 0      CONTINUE these medications which have CHANGED   Details  LORazepam (ATIVAN) 0.5 MG tablet Take 1 tablet (0.5 mg total) by mouth every 8 (eight) hours as needed for anxiety. Qty: 21 tablet, Refills: 0      CONTINUE these medications which have NOT CHANGED   Details  albuterol (PROVENTIL) (2.5 MG/3ML) 0.083% nebulizer solution Take 2.5 mg by nebulization every 4 (four) hours as needed for wheezing or shortness of breath.    beclomethasone (QVAR) 80 MCG/ACT inhaler Inhale 1 puff into the lungs 2 (two) times daily.    carvedilol (COREG) 12.5 MG tablet Take 1 tablet (12.5 mg total) by mouth 2 (two) times daily with a meal. Qty: 30 tablet, Refills: 0    enalapril (VASOTEC) 20 MG tablet Take 20 mg by mouth 2 (two) times daily.     famotidine (PEPCID) 20 MG tablet Take 20 mg by mouth 2 (two) times daily.    fluticasone (FLONASE) 50 MCG/ACT nasal spray Place 1 spray into both nostrils daily.    Fluticasone-Salmeterol (ADVAIR) 500-50 MCG/DOSE AEPB Inhale 1 puff into the lungs 2 (two) times daily.    ipratropium-albuterol (DUONEB) 0.5-2.5 (3) MG/3ML SOLN Take 3 mLs by nebulization every 4 (four) hours as needed.    montelukast (SINGULAIR) 10 MG tablet Take 10 mg  by mouth every evening.    roflumilast (DALIRESP) 500 MCG TABS tablet Take 500 mcg by mouth daily.    tiotropium (SPIRIVA) 18 MCG inhalation capsule Place 18 mcg into inhaler and inhale daily.      STOP taking these medications     amLODipine (NORVASC) 5 MG tablet      hydrochlorothiazide (HYDRODIURIL) 25 MG tablet      loratadine (CLARITIN) 10 MG tablet          DISCHARGE INSTRUCTIONS:   Follow-up with your medical doctor at  [facility] in 1 week. Follow-up with your pulmonologist at [facility] in 2 weeks. Recommend outpatient sleep study.  If you experience worsening of your admission symptoms, develop shortness of breath, life threatening emergency, suicidal or homicidal thoughts you must seek medical attention immediately by calling 911 or calling your MD immediately  if symptoms less severe.  You Must read complete instructions/literature along with all the possible adverse reactions/side effects for all the Medicines you take and that have been prescribed to you. Take any new Medicines after you have completely understood and accept all the possible adverse reactions/side effects.   Please note  You were cared for by a hospitalist during your hospital stay. If you have any questions about your discharge medications or the care you received while you were in the hospital after you are discharged, you can call the unit and asked to speak with the hospitalist on call if the hospitalist that took care of you is not available. Once you are discharged, your primary care physician will handle any further medical issues. Please note that NO REFILLS for any discharge medications will be authorized once you are discharged, as it is imperative that you return to your primary care physician (or establish a relationship with a primary care physician if you do not have one) for your aftercare needs so that they can reassess your need for medications and monitor your lab values.    Today   CHIEF COMPLAINT:   Chief Complaint  Patient presents with  . Shortness of Breath    HISTORY OF PRESENT ILLNESS:  Julie Foley  is a 59 y.o. female with a known history of chronic respiratory failure and COPD presented with shortness of breath and found to be in COPD exacerbation with acute on chronic respiratory failure.   VITAL SIGNS:  Blood pressure 120/65, pulse 76, temperature 97.7 F (36.5 C), temperature source Oral, resp. rate 18, height 5'  2" (1.575 m), weight 76.658 kg (169 lb), SpO2 99 %.  I/O:   Intake/Output Summary (Last 24 hours) at 10/09/14 0748 Last data filed at 10/08/14 1800  Gross per 24 hour  Intake    720 ml  Output      0 ml  Net    720 ml    PHYSICAL EXAMINATION:  GENERAL:  59 y.o.-year-old patient lying in the bed with no acute distress.  EYES: Pupils equal, round, reactive to light and accommodation. No scleral icterus. Extraocular muscles intact.  HEENT: Head atraumatic, normocephalic. Oropharynx and nasopharynx clear.  NECK:  Supple, no jugular venous distention. No thyroid enlargement, no tenderness.  LUNGS: Normal breath sounds bilaterally, no wheezing, rales,rhonchi or crepitation. No use of accessory muscles of respiration.  CARDIOVASCULAR: S1, S2 normal. No murmurs, rubs, or gallops.  ABDOMEN: Soft, non-tender, non-distended. Bowel sounds present. No organomegaly or mass.  EXTREMITIES: Trace edema, no cyanosis.  NEUROLOGIC: Cranial nerves II through XII are intact. Muscle strength 5/5 in all extremities. Sensation intact.  Gait not checked.  PSYCHIATRIC: The patient is alert and oriented x 3.  SKIN: No obvious rash, lesion, or ulcer.   DATA REVIEW:   CBC  Recent Labs Lab 10/06/14 2319  WBC 5.9  HGB 12.8  HCT 39.0  PLT 283    Chemistries   Recent Labs Lab 10/06/14 2319  NA 144  K 4.2  CL 105  CO2 31  GLUCOSE 119*  BUN 14  CREATININE 0.87  CALCIUM 9.3    Cardiac Enzymes  Recent Labs Lab 10/06/14 2319  TROPONINI <0.03    Management plans discussed with the patient, family and they are in agreement.  CODE STATUS:     Code Status Orders        Start     Ordered   10/07/14 0355  Full code   Continuous     10/07/14 0354      TOTAL TIME TAKING CARE OF THIS PATIENT: 35 minutes.    Alford Highland M.D on 10/09/2014 at 7:48 AM  Between 7am to 6pm - Pager - [phone]  After 6pm go to [email or web address] - password [email or web address] [facility]  [place] [facility]   Office  [phone]  CC: Primary care physician; [facility].

## 2014-10-09 NOTE — Progress Notes (Signed)
Pt rested during evening. PRN duoneb for shortness of breath, relief. PRN ativan x 1. Ambulates to bathroom. Denies pain on assessment. Will cont to monitor.

## 2014-10-09 NOTE — Progress Notes (Signed)
Pt discharged home ambulatory with home o2. . Verbalized understanding  Of d/c instructions  Including prednisone  And azithromycin

## 2014-10-09 NOTE — Discharge Instructions (Addendum)
Acute Respiratory Failure °Respiratory failure is when your lungs are not working well and your breathing (respiratory) system fails. When respiratory failure occurs, it is difficult for your lungs to get enough oxygen, get rid of carbon dioxide, or both. Respiratory failure can be life threatening.  °Respiratory failure can be acute or chronic. Acute respiratory failure is sudden, severe, and requires emergency medical treatment. Chronic respiratory failure is less severe, happens over time, and requires ongoing treatment.  °WHAT ARE THE CAUSES OF ACUTE RESPIRATORY FAILURE?  °Any problem affecting the heart or lungs can cause acute respiratory failure. Some of these causes include the following: °· Chronic bronchitis and emphysema (COPD).   °· Blood clot going to a lung (pulmonary embolism).   °· Having water in the lungs caused by heart failure, lung injury, or infection (pulmonary edema).   °· Collapsed lung (pneumothorax).   °· Pneumonia.   °· Pulmonary fibrosis.   °· Obesity.   °· Asthma.   °· Heart failure.   °· Any type of trauma to the chest that can make breathing difficult.   °· Nerve or muscle diseases making chest movements difficult. °WHAT SYMPTOMS SHOULD YOU WATCH FOR?  °If you have any of these signs or symptoms, you should seek immediate medical care:  °· You have shortness of breath (dyspnea) with or without activity.   °· You have rapid, fast breathing (tachypnea).   °· You are wheezing. °· You are unable to say more than a few words without having to catch your breath. °· You find it very difficult to function normally. °· You have a fast heart rate.   °· You have a bluish color to your finger or toe nail beds.   °· You have confusion or drowsiness or both.   °HOW WILL MY ACUTE RESPIRATORY FAILURE BE TREATED?  °Treatment of acute respiratory failure depends on the cause of the respiratory failure. Usually, you will stay in the intensive care unit so your breathing can be watched closely. Treatment  can include the following: °· Oxygen. Oxygen can be delivered through the following: °· Nasal cannula. This is small tubing that goes in your nose to give you oxygen. °· Face mask. A face mask covers your nose and mouth to give you oxygen. °· Medicine. Different medicines can be given to help with breathing. These can include: °· Nebulizers. Nebulizers deliver medicines to open the air passages (bronchodilators). These medicines help to open or relax the airways in the lungs so you can breathe better. They can also help loosen mucus from your lungs. °· Diuretics. Diuretic medicines can help you breathe better by getting rid of extra water in your body. °· Steroids. Steroid medicines can help decrease swelling (inflammation) in your lungs. °· Antibiotics. °· Chest tube. If you have a collapsed lung (pneumothorax), a chest tube is placed to help reinflate the lung. °· Non-invasive positive pressure ventilation (NPPV). This is a tight-fitting mask that goes over your nose and mouth. The mask has tubing that is attached to a machine. The machine blows air into the tubing, which helps to keep the tiny air sacs (alveoli) in your lungs open. This machine allows you to breathe on your own. °· Ventilator. A ventilator is a breathing machine. When on a ventilator, a breathing tube is put into the lungs. A ventilator is used when you can no longer breathe well enough on your own. You may have low oxygen levels or high carbon dioxide (CO2) levels in your blood. When you are on a ventilator, sedation and pain medicines are given to make you sleep   so your lungs can heal. Document Released: 03/22/2013 Document Revised: 08/01/2013 Document Reviewed: 03/22/2013 [facility] Patient Information 2015 [place], [place]. This information is not intended to replace advice given to you by your health care provider. Make sure you discuss any questions you have with your health care provider.  Chronic Obstructive Pulmonary Disease  Exacerbation  Chronic obstructive pulmonary disease (COPD) is a common lung problem. In COPD, the flow of air from the lungs is limited. COPD exacerbations are times that breathing gets worse and you need extra treatment. Without treatment they can be life threatening. If they happen often, your lungs can become more damaged. HOME CARE  Do not smoke.  Avoid tobacco smoke and other things that bother your lungs.  If given, take your antibiotic medicine as told. Finish the medicine even if you start to feel better.  Only take medicines as told by your doctor.  Drink enough fluids to keep your pee (urine) clear or pale yellow (unless your doctor has told you not to).  Use a cool mist machine (vaporizer).  If you use oxygen or a machine that turns liquid medicine into a mist (nebulizer), continue to use them as told.  Keep up with shots (vaccinations) as told by your doctor.  Exercise regularly.  Eat healthy foods.  Keep all doctor visits as told. GET HELP RIGHT AWAY IF:  You are very short of breath and it gets worse.  You have trouble talking.  You have bad chest pain.  You have blood in your spit (sputum).  You have a fever.  You keep throwing up (vomiting).  You feel weak, or you pass out (faint).  You feel confused.  You keep getting worse. MAKE SURE YOU:   Understand these instructions.  Will watch your condition.  Will get help right away if you are not doing well or get worse. Document Released: 03/06/2011 Document Revised: 01/05/2013 Document Reviewed: 11/19/2012 [facility] Patient Information 2015 [place], [place]. This information is not intended to replace advice given to you by your health care provider. Make sure you discuss any questions you have with your health care provider.

## 2014-10-11 LAB — GLUCOSE, CAPILLARY: Glucose-Capillary: 139 mg/dL — ABNORMAL HIGH (ref 65–99)

## 2014-10-31 ENCOUNTER — Emergency Department: Payer: Medicaid Other

## 2014-10-31 ENCOUNTER — Other Ambulatory Visit: Payer: Self-pay

## 2014-10-31 ENCOUNTER — Encounter: Payer: Self-pay | Admitting: Medical Oncology

## 2014-10-31 ENCOUNTER — Emergency Department
Admission: EM | Admit: 2014-10-31 | Discharge: 2014-10-31 | Disposition: A | Discharge status: Home / Self Care | Payer: Medicaid Other | Attending: Emergency Medicine | Admitting: Emergency Medicine

## 2014-10-31 DIAGNOSIS — Z7952 Long term (current) use of systemic steroids: Secondary | ICD-10-CM | POA: Insufficient documentation

## 2014-10-31 DIAGNOSIS — J441 Chronic obstructive pulmonary disease with (acute) exacerbation: Secondary | ICD-10-CM | POA: Diagnosis not present

## 2014-10-31 DIAGNOSIS — Z79899 Other long term (current) drug therapy: Secondary | ICD-10-CM | POA: Insufficient documentation

## 2014-10-31 DIAGNOSIS — Z7951 Long term (current) use of inhaled steroids: Secondary | ICD-10-CM | POA: Diagnosis not present

## 2014-10-31 DIAGNOSIS — I10 Essential (primary) hypertension: Secondary | ICD-10-CM | POA: Insufficient documentation

## 2014-10-31 DIAGNOSIS — Z87891 Personal history of nicotine dependence: Secondary | ICD-10-CM | POA: Diagnosis not present

## 2014-10-31 DIAGNOSIS — R0602 Shortness of breath: Secondary | ICD-10-CM | POA: Diagnosis present

## 2014-10-31 MED ORDER — IPRATROPIUM-ALBUTEROL 0.5-2.5 (3) MG/3ML IN SOLN
3.0000 mL | RESPIRATORY_TRACT | Status: AC
Start: 2014-10-31 — End: 2014-10-31
  Administered 2014-10-31 (×3): 3 mL via RESPIRATORY_TRACT
  Filled 2014-10-31 (×2): qty 3

## 2014-10-31 MED ORDER — PREDNISONE 20 MG PO TABS: 40.0000 mg | ORAL_TABLET | Freq: Every day | ORAL | Status: DC

## 2014-10-31 MED ORDER — PREDNISONE 20 MG PO TABS
60.0000 mg | ORAL_TABLET | Freq: Once | ORAL | Status: AC
  Administered 2014-10-31: 60 mg via ORAL
  Filled 2014-10-31: qty 3

## 2014-10-31 NOTE — Discharge Instructions (Signed)

## 2014-10-31 NOTE — ED Notes (Signed)
Flu/pna not collected charted in error
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
## 2014-10-31 NOTE — ED Notes (Signed)
Pt discharged home after verbalizing understanding of discharge instructions; nad noted. 

## 2014-10-31 NOTE — ED Notes (Signed)
Pt assisted with toileting and refuses flu screening

## 2014-12-01 ENCOUNTER — Encounter: Payer: Self-pay | Admitting: *Deleted

## 2014-12-01 ENCOUNTER — Emergency Department
Admission: EM | Admit: 2014-12-01 | Discharge: 2014-12-02 | Disposition: A | Discharge status: Home / Self Care | Payer: Medicaid Other | Attending: Emergency Medicine | Admitting: Emergency Medicine

## 2014-12-01 ENCOUNTER — Emergency Department: Payer: Medicaid Other

## 2014-12-01 ENCOUNTER — Other Ambulatory Visit: Payer: Self-pay

## 2014-12-01 DIAGNOSIS — J441 Chronic obstructive pulmonary disease with (acute) exacerbation: Secondary | ICD-10-CM | POA: Diagnosis not present

## 2014-12-01 DIAGNOSIS — Z87891 Personal history of nicotine dependence: Secondary | ICD-10-CM | POA: Diagnosis not present

## 2014-12-01 DIAGNOSIS — Z79899 Other long term (current) drug therapy: Secondary | ICD-10-CM | POA: Insufficient documentation

## 2014-12-01 DIAGNOSIS — Z792 Long term (current) use of antibiotics: Secondary | ICD-10-CM | POA: Diagnosis not present

## 2014-12-01 DIAGNOSIS — I10 Essential (primary) hypertension: Secondary | ICD-10-CM | POA: Diagnosis not present

## 2014-12-01 DIAGNOSIS — Z7951 Long term (current) use of inhaled steroids: Secondary | ICD-10-CM | POA: Insufficient documentation

## 2014-12-01 DIAGNOSIS — R0600 Dyspnea, unspecified: Secondary | ICD-10-CM | POA: Diagnosis present

## 2014-12-01 MED ORDER — MAGNESIUM SULFATE 2 GM/50ML IV SOLN
2.0000 g | Freq: Once | INTRAVENOUS | Status: AC
  Administered 2014-12-01: 2 g via INTRAVENOUS
  Filled 2014-12-01: qty 50

## 2014-12-01 MED ORDER — PREDNISONE 10 MG PO TABS: 50.0000 mg | ORAL_TABLET | Freq: Every day | ORAL | Status: DC

## 2014-12-01 MED ORDER — IPRATROPIUM-ALBUTEROL 0.5-2.5 (3) MG/3ML IN SOLN
3.0000 mL | Freq: Once | RESPIRATORY_TRACT | Status: AC
  Administered 2014-12-01: 3 mL via RESPIRATORY_TRACT
  Filled 2014-12-01: qty 3

## 2014-12-01 NOTE — ED Notes (Signed)
Pt refusing second IV at this time, stating "i am not going to the ICU so i dont need another one" RN states she would come back and attempt later and pt agreed.

## 2014-12-01 NOTE — Discharge Instructions (Signed)
You were evaluated for COPD exacerbation and severe wheezing on arrival. I recommended hospital admission, however you chose to go home today.  Return to the emergency department for any new or worsening condition including chest pain, trouble breathing, shortness breath, fever, altered mental status, or any other symptoms concerning to you.  You're being prescribed prednisone for COPD exacerbation. Take your albuterol inhaler 2 puffs every 4 hours as needed for wheezing or shortness of breath.   Chronic Obstructive Pulmonary Disease Exacerbation Chronic obstructive pulmonary disease (COPD) is a common lung condition in which airflow from the lungs is limited. COPD is a general term that can be used to describe many different lung problems that limit airflow, including chronic bronchitis and emphysema. COPD exacerbations are episodes when breathing symptoms become much worse and require extra treatment. Without treatment, COPD exacerbations can be life threatening, and frequent COPD exacerbations can cause further damage to your lungs. CAUSES   Respiratory infections.   Exposure to smoke.   Exposure to air pollution, chemical fumes, or dust. Sometimes there is no apparent cause or trigger. RISK FACTORS  Smoking cigarettes.  Older age.  Frequent prior COPD exacerbations. SIGNS AND SYMPTOMS   Increased coughing.   Increased thick spit (sputum) production.   Increased wheezing.   Increased shortness of breath.   Rapid breathing.   Chest tightness. DIAGNOSIS  Your medical history, a physical exam, and tests will help your health care provider make a diagnosis. Tests may include:  A chest X-ray.  Basic lab tests.  Sputum testing.  An arterial blood gas test. TREATMENT  Depending on the severity of your COPD exacerbation, you may need to be admitted to a hospital for treatment. Some of the treatments commonly used to treat COPD exacerbations are:   Antibiotic  medicines.   Bronchodilators. These are drugs that expand the air passages. They may be given with an inhaler or nebulizer. Spacer devices may be needed to help improve drug delivery.  Corticosteroid medicines.  Supplemental oxygen therapy.  HOME CARE INSTRUCTIONS   Do not smoke. Quitting smoking is very important to prevent COPD from getting worse and exacerbations from happening as often.  Avoid exposure to all substances that irritate the airway, especially to tobacco smoke.   If you were prescribed an antibiotic medicine, finish it all even if you start to feel better.  Take all medicines as directed by your health care provider.It is important to use correct technique with inhaled medicines.  Drink enough fluids to keep your urine clear or pale yellow (unless you have a medical condition that requires fluid restriction).  Use a cool mist vaporizer. This makes it easier to clear your chest when you cough.   If you have a home nebulizer and oxygen, continue to use them as directed.   Maintain all necessary vaccinations to prevent infections.   Exercise regularly.   Eat a healthy diet.   Keep all follow-up appointments as directed by your health care provider. SEEK IMMEDIATE MEDICAL CARE IF:  You have worsening shortness of breath.   You have trouble talking.   You have severe chest pain.  You have blood in your sputum.  You have a fever.  You have weakness, vomit repeatedly, or faint.   You feel confused.   You continue to get worse. MAKE SURE YOU:   Understand these instructions.  Will watch your condition.  Will get help right away if you are not doing well or get worse. Document Released: 01/12/2007 Document Revised:  08/01/2013 Document Reviewed: 11/19/2012 [facility] Patient Information 2015 [place], [place]. This information is not intended to replace advice given to you by your health care provider. Make sure you discuss any questions you  have with your health care provider.

## 2014-12-01 NOTE — ED Notes (Addendum)
Pt to ED from home via EMS due to respiratory distress. Hx of asthma, COPD, CHF, hypertension. Pt AAOx3 on arrival using BiPap, talking in choppy/full sentences. Pt given 125 solumedrol and 2 duonebs via EMS. MD Lord and RT at bedside on arrival.

## 2014-12-01 NOTE — ED Provider Notes (Signed)
[facility] Emergency Department Provider Note   ____________________________________________  Time seen: On EMS arrival I have reviewed the triage vital signs and the triage nursing note.  HISTORY  Chief Complaint Respiratory Distress   Historian Patient, somewhat limited due to respiratory distress.  HPI Julie Foley is a 59 y.o. female who has a history of COPD and CHF as well as hypertension, and anxiety, who called EMS due to respiratory distress. She is found to have severely decreased breath soundsand was started on cPAP. She was also given DuoNeb 2 in the field along with slight Medrol IV. Patient was reportedly much better than when EMS found her, however she is still on CPAP on arrival. Denies chest pain. Denies palpitations. Positive for severe shortness of breath. No recent fevers.   Past Medical History  Diagnosis Date  . COPD (chronic obstructive pulmonary disease)   . CHF (congestive heart failure)   . HTN (hypertension)   . GERD (gastroesophageal reflux disease)   . Depression   . Anxiety     Patient Active Problem List   Diagnosis Date Noted  . Acute respiratory failure with hypoxemia 10/07/2014  . Anxiety 10/07/2014  . HTN (hypertension) 09/23/2014  . GERD (gastroesophageal reflux disease) 09/23/2014  . CHF (congestive heart failure) 09/23/2014  . COPD exacerbation 08/11/2014    Past Surgical History  Procedure Laterality Date  . Tubal ligation Bilateral     Current Outpatient Rx  Name  Route  Sig  Dispense  Refill  . albuterol (PROVENTIL) (2.5 MG/3ML) 0.083% nebulizer solution   Nebulization   Take 2.5 mg by nebulization every 4 (four) hours as needed for wheezing or shortness of breath.         Marland Kitchen amLODipine (NORVASC) 5 MG tablet   Oral   Take 5 mg by mouth daily.         Marland Kitchen azithromycin (ZITHROMAX) 250 MG tablet      One tab daily for three days   3 each   0   . beclomethasone (QVAR) 80 MCG/ACT inhaler    Inhalation   Inhale 1 puff into the lungs 2 (two) times daily.         . carvedilol (COREG) 12.5 MG tablet   Oral   Take 1 tablet (12.5 mg total) by mouth 2 (two) times daily with a meal.   30 tablet   0   . citalopram (CELEXA) 10 MG tablet   Oral   Take 10 mg by mouth daily.         . enalapril (VASOTEC) 20 MG tablet   Oral   Take 20 mg by mouth 2 (two) times daily.          . famotidine (PEPCID) 20 MG tablet   Oral   Take 20 mg by mouth 2 (two) times daily.         . fluticasone (FLONASE) 50 MCG/ACT nasal spray   Each Nare   Place 1 spray into both nostrils daily.         . Fluticasone-Salmeterol (ADVAIR) 500-50 MCG/DOSE AEPB   Inhalation   Inhale 1 puff into the lungs 2 (two) times daily.         . hydrochlorothiazide (HYDRODIURIL) 25 MG tablet   Oral   Take 25 mg by mouth daily.         Marland Kitchen ipratropium (ATROVENT) 0.02 % nebulizer solution   Nebulization   Take 0.5 mg by nebulization 4 (four) times daily.         Marland Kitchen  loratadine (CLARITIN) 10 MG tablet   Oral   Take 10 mg by mouth daily.         Marland Kitchen LORazepam (ATIVAN) 0.5 MG tablet   Oral   Take 1 tablet (0.5 mg total) by mouth every 8 (eight) hours as needed for anxiety.   21 tablet   0   . montelukast (SINGULAIR) 10 MG tablet   Oral   Take 10 mg by mouth every evening.         . nitroGLYCERIN (NITROSTAT) 0.4 MG SL tablet   Sublingual   Place 0.4 mg under the tongue every 5 (five) minutes as needed for chest pain.         . predniSONE (DELTASONE) 10 MG tablet   Oral   Take 5 tablets (50 mg total) by mouth daily.   20 tablet   0   . roflumilast (DALIRESP) 500 MCG TABS tablet   Oral   Take 500 mcg by mouth daily.         Marland Kitchen tiotropium (SPIRIVA) 18 MCG inhalation capsule   Inhalation   Place 18 mcg into inhaler and inhale daily.           Allergies Budesonide-formoterol fumarate  Family History  Problem Relation Age of Onset  . Cancer Mother   . Hypertension Mother   .  Diabetes Mother   . Asthma Son     Social History Social History  Substance Use Topics  . Smoking status: Former Smoker -- 2.00 packs/day for 35 years    Types: Cigarettes  . Smokeless tobacco: None  . Alcohol Use: Yes    Review of Systems Obtained after patient no longer in significant respiratory distress Constitutional: Negative for fever. Eyes: Negative for visual changes. ENT: Negative for sore throat. Cardiovascular: Negative for chest pain. Respiratory: Positive for wheezing and for shortness of breath. Gastrointestinal: Negative for abdominal pain, vomiting and diarrhea. Genitourinary: Negative for dysuria. Musculoskeletal: Negative for back pain. Skin: Negative for rash. Neurological: Negative for headache. 10 point Review of Systems otherwise negative ____________________________________________   PHYSICAL EXAM:  VITAL SIGNS: ED Triage Vitals  Enc Vitals Group     BP 12/01/14 2132 126/90 mmHg     Pulse Rate 12/01/14 2132 136     Resp 12/01/14 2210 26     Temp 12/01/14 2132 98.2 F (36.8 C)     Temp Source 12/01/14 2132 Axillary     SpO2 12/01/14 2132 99 %     Weight 12/01/14 2132 174 lb (78.926 kg)     Height 12/01/14 2132  (1.575 m)     Head Cir --      Peak Flow --      Pain Score --      Pain Loc --      Pain Edu? --      Excl. in GC? --      Constitutional: Alert and oriented. Tripoding with severe respiratory distress. EMS CPAP in place. Eyes: Conjunctivae are normal. PERRL. Normal extraocular movements. ENT   Head: Normocephalic and atraumatic.   Nose: No congestion/rhinnorhea.   Mouth/Throat: Mucous membranes are moist.   Neck: No stridor. Cardiovascular/Chest: Tachycardic and regular.  No murmurs, rubs, or gallops. Respiratory: Tripoding. Severely decreased air movement throughout all fields with significant wheezing in all fields. No rhonchi. Positive for retractions. Gastrointestinal: Soft. No distention, no guarding,  no rebound. Nontender   Genitourinary/rectal:Deferred Musculoskeletal: Nontender with normal range of motion in all extremities. No joint effusions.  No  lower extremity tenderness.  No edema. Neurologic:  Normal speech and language. No gross or focal neurologic deficits are appreciated. Skin:  Skin is warm, dry and intact. No rash noted. Psychiatric: Mood and affect are normal. Speech and behavior are normal. No psychosis. She is alert and oriented 4.  ____________________________________________   EKG I, Governor Rooks, MD, the attending physician have personally viewed and interpreted all ECGs.  136 bpm. Sinus tachycardia. Narrow QRS. Normal axis. Normal ST and T-wave. ____________________________________________  LABS (pertinent positives/negatives)  Pending  ____________________________________________  RADIOLOGY All Xrays were viewed by me. Imaging interpreted by Radiologist.  Chest X ray: Pending __________________________________________  PROCEDURES  Procedure(s) performed: None  Critical Care performed: CRITICAL CARE Performed by: Governor Rooks   Total critical care time: 30 minutes  Critical care time was exclusive of separately billable procedures and treating other patients.  Critical care was necessary to treat or prevent imminent or life-threatening deterioration.  Critical care was time spent personally by me on the following activities: development of treatment plan with patient and/or surrogate as well as nursing, discussions with consultants, evaluation of patient's response to treatment, examination of patient, obtaining history from patient or surrogate, ordering and performing treatments and interventions, ordering and review of laboratory studies, ordering and review of radiographic studies, pulse oximetry and re-evaluation of patient's condition.   ____________________________________________   ED COURSE / ASSESSMENT AND PLAN  CONSULTATIONS:  None  Pertinent labs & imaging results that were available during my care of the patient were reviewed by me and considered in my medical decision making (see chart for details).  Patient is well-known to this emergency department for severe COPD and frequent COPD exacerbation. She arrived still in respiratory distress with CPAP. She was transferred to BiPAP and continued on 2 additional DuoNeb's after she received 2 in the field. I added magnesium as well.  Patient frequently refuses hospital admission despite the severity of her underlying respiratory disease and COPD exacerbation. Today even while tripoding patient is asking when she could go home.  ----------------------------------------- 11:41 PM on 12/01/2014 -----------------------------------------  Patient started on BiPAP, however much improved breath sounds, decreased wheezing, increased air movement, and decreased retractions. After labs and chest x-ray, patient will be tried on room air to see how she tolerates this. Patient stated that she is going to go home and she is going to refuse hospital admission.  Patient care transferred to Dr. Lenard Lance, labs and chest x-ray are pending. Patient will need to be checked off of CPAP and reevaluated to determine whether or not she is to stay in the hospital for COPD exacerbation or be discharged home. If she is discharged home, I filled out her discharge instructions.  Patient / Family / Caregiver informed of clinical course, medical decision-making process, and agree with plan.   I discussed return precautions, follow-up instructions, and discharged instructions with patient and/or family.  ___________________________________________   FINAL CLINICAL IMPRESSION(S) / ED DIAGNOSES   Final diagnoses:  COPD exacerbation       Governor Rooks, MD 12/01/14 [phone]

## 2014-12-02 LAB — BASIC METABOLIC PANEL
ANION GAP: 7 (ref 5–15)
BUN: 9 mg/dL (ref 6–20)
CHLORIDE: 106 mmol/L (ref 101–111)
CO2: 29 mmol/L (ref 22–32)
CREATININE: 0.85 mg/dL (ref 0.44–1.00)
Calcium: 9.4 mg/dL (ref 8.9–10.3)
GFR calc non Af Amer: >60 mL/min (ref >60)
Glucose, Bld: 113 mg/dL — ABNORMAL HIGH (ref 65–99)
Potassium: 4.2 mmol/L (ref 3.5–5.1)
Sodium: 142 mmol/L (ref 135–145)

## 2014-12-02 LAB — CBC WITH DIFFERENTIAL/PLATELET
BASOS ABS: 0.1 10*3/uL (ref 0–0.1)
BASOS PCT: 1 %
EOS ABS: 0.4 10*3/uL (ref 0–0.7)
EOS PCT: 4 %
HCT: 40.2 % (ref 35.0–47.0)
HEMOGLOBIN: 13.1 g/dL (ref 12.0–16.0)
Lymphocytes Relative: 26 %
Lymphs Abs: 2.7 10*3/uL (ref 1.0–3.6)
MCH: 32.9 pg (ref 26.0–34.0)
MCHC: 32.6 g/dL (ref 32.0–36.0)
MCV: 100.9 fL — ABNORMAL HIGH (ref 80.0–100.0)
Monocytes Absolute: 0.7 10*3/uL (ref 0.2–0.9)
Monocytes Relative: 7 %
NEUTROS PCT: 62 %
Neutro Abs: 6.5 10*3/uL (ref 1.4–6.5)
PLATELETS: 363 10*3/uL (ref 150–440)
RBC: 3.99 MIL/uL (ref 3.80–5.20)
RDW: 13.8 % (ref 11.5–14.5)
WBC: 10.4 10*3/uL (ref 3.6–11.0)

## 2014-12-02 LAB — TROPONIN I: Troponin I: <0.03 ng/mL (ref <0.031)

## 2014-12-02 NOTE — ED Provider Notes (Signed)
-----------------------------------------   1:47 AM on 12/02/2014 -----------------------------------------  Patient states she feels back to normal. BiPAP mask is been removed, patient continues to sat 97-98%. Patient is asking to be discharged home at this time. We will discharge patient home with strict return precautions. Patient is agreeable to plan.  [name], MD 12/02/14 [phone]

## 2014-12-02 NOTE — ED Notes (Signed)
resp therapy called to remove pt from bipap to see how she tolerates staying on 4L. tolerated well.

## 2014-12-10 ENCOUNTER — Inpatient Hospital Stay
Admission: EM | Admit: 2014-12-10 | Discharge: 2014-12-11 | DRG: 190 | Disposition: A | Discharge status: Home Health | Payer: Medicaid Other | Attending: Internal Medicine | Admitting: Internal Medicine

## 2014-12-10 ENCOUNTER — Emergency Department: Payer: Medicaid Other

## 2014-12-10 ENCOUNTER — Encounter: Payer: Self-pay | Admitting: Emergency Medicine

## 2014-12-10 DIAGNOSIS — J9620 Acute and chronic respiratory failure, unspecified whether with hypoxia or hypercapnia: Secondary | ICD-10-CM | POA: Diagnosis present

## 2014-12-10 DIAGNOSIS — Z9981 Dependence on supplemental oxygen: Secondary | ICD-10-CM | POA: Diagnosis not present

## 2014-12-10 DIAGNOSIS — Z87891 Personal history of nicotine dependence: Secondary | ICD-10-CM | POA: Diagnosis not present

## 2014-12-10 DIAGNOSIS — I5032 Chronic diastolic (congestive) heart failure: Secondary | ICD-10-CM | POA: Diagnosis present

## 2014-12-10 DIAGNOSIS — J45909 Unspecified asthma, uncomplicated: Secondary | ICD-10-CM | POA: Diagnosis present

## 2014-12-10 DIAGNOSIS — Z79899 Other long term (current) drug therapy: Secondary | ICD-10-CM | POA: Diagnosis not present

## 2014-12-10 DIAGNOSIS — Z888 Allergy status to other drugs, medicaments and biological substances status: Secondary | ICD-10-CM | POA: Diagnosis not present

## 2014-12-10 DIAGNOSIS — F419 Anxiety disorder, unspecified: Secondary | ICD-10-CM | POA: Diagnosis present

## 2014-12-10 DIAGNOSIS — J440 Chronic obstructive pulmonary disease with acute lower respiratory infection: Principal | ICD-10-CM | POA: Diagnosis present

## 2014-12-10 DIAGNOSIS — K219 Gastro-esophageal reflux disease without esophagitis: Secondary | ICD-10-CM | POA: Diagnosis present

## 2014-12-10 DIAGNOSIS — I10 Essential (primary) hypertension: Secondary | ICD-10-CM | POA: Diagnosis present

## 2014-12-10 DIAGNOSIS — J209 Acute bronchitis, unspecified: Secondary | ICD-10-CM | POA: Diagnosis present

## 2014-12-10 DIAGNOSIS — J441 Chronic obstructive pulmonary disease with (acute) exacerbation: Secondary | ICD-10-CM | POA: Diagnosis not present

## 2014-12-10 DIAGNOSIS — F329 Major depressive disorder, single episode, unspecified: Secondary | ICD-10-CM | POA: Diagnosis present

## 2014-12-10 LAB — BASIC METABOLIC PANEL
Anion gap: 6 (ref 5–15)
BUN: 10 mg/dL (ref 6–20)
CALCIUM: 9.1 mg/dL (ref 8.9–10.3)
CO2: 31 mmol/L (ref 22–32)
CREATININE: 0.75 mg/dL (ref 0.44–1.00)
Chloride: 105 mmol/L (ref 101–111)
GFR calc Af Amer: >60 mL/min (ref >60)
GFR calc non Af Amer: >60 mL/min (ref >60)
GLUCOSE: 111 mg/dL — AB (ref 65–99)
Potassium: 4.2 mmol/L (ref 3.5–5.1)
Sodium: 142 mmol/L (ref 135–145)

## 2014-12-10 LAB — BLOOD GAS, ARTERIAL
ACID-BASE EXCESS: 2.8 mmol/L (ref 0.0–3.0)
BICARBONATE: 31.5 meq/L — AB (ref 21.0–28.0)
Delivery systems: POSITIVE
EXPIRATORY PAP: 8
FIO2: 0.6
Inspiratory PAP: 16
MECHANICAL RATE: 10
O2 Saturation: 99.7 %
PATIENT TEMPERATURE: 37
pCO2 arterial: 67 mmHg — ABNORMAL HIGH (ref 32.0–48.0)
pH, Arterial: 7.28 — ABNORMAL LOW (ref 7.350–7.450)
pO2, Arterial: 207 mmHg — ABNORMAL HIGH (ref 83.0–108.0)

## 2014-12-10 LAB — CBC
HCT: 39 % (ref 35.0–47.0)
Hemoglobin: 13.1 g/dL (ref 12.0–16.0)
MCH: 33.6 pg (ref 26.0–34.0)
MCHC: 33.6 g/dL (ref 32.0–36.0)
MCV: 100 fL (ref 80.0–100.0)
PLATELETS: 263 10*3/uL (ref 150–440)
RBC: 3.89 MIL/uL (ref 3.80–5.20)
RDW: 13.1 % (ref 11.5–14.5)
WBC: 9.3 10*3/uL (ref 3.6–11.0)

## 2014-12-10 LAB — MRSA PCR SCREENING: MRSA BY PCR: NEGATIVE

## 2014-12-10 LAB — GLUCOSE, CAPILLARY: GLUCOSE-CAPILLARY: 151 mg/dL — AB (ref 65–99)

## 2014-12-10 LAB — TROPONIN I

## 2014-12-10 LAB — BRAIN NATRIURETIC PEPTIDE: B Natriuretic Peptide: 51 pg/mL (ref 0.0–100.0)

## 2014-12-10 MED ORDER — SODIUM CHLORIDE 0.9 % IJ SOLN: 3.0000 mL | INTRAMUSCULAR | Status: DC | PRN

## 2014-12-10 MED ORDER — IPRATROPIUM-ALBUTEROL 0.5-2.5 (3) MG/3ML IN SOLN
6.0000 mL | Freq: Once | RESPIRATORY_TRACT | Status: AC
  Administered 2014-12-10: 6 mL via RESPIRATORY_TRACT

## 2014-12-10 MED ORDER — TIOTROPIUM BROMIDE MONOHYDRATE 18 MCG IN CAPS
18.0000 ug | ORAL_CAPSULE | Freq: Every day | RESPIRATORY_TRACT | Status: DC
  Administered 2014-12-11: 18 ug via RESPIRATORY_TRACT
  Filled 2014-12-10: qty 5

## 2014-12-10 MED ORDER — BECLOMETHASONE DIPROPIONATE 80 MCG/ACT IN AERS: 1.0000 | INHALATION_SPRAY | Freq: Two times a day (BID) | RESPIRATORY_TRACT | Status: DC

## 2014-12-10 MED ORDER — SODIUM CHLORIDE 0.9 % IJ SOLN
3.0000 mL | Freq: Two times a day (BID) | INTRAMUSCULAR | Status: DC
  Administered 2014-12-10: 3 mL via INTRAVENOUS

## 2014-12-10 MED ORDER — ONDANSETRON HCL 4 MG PO TABS: 4.0000 mg | ORAL_TABLET | Freq: Four times a day (QID) | ORAL | Status: DC | PRN

## 2014-12-10 MED ORDER — IPRATROPIUM-ALBUTEROL 0.5-2.5 (3) MG/3ML IN SOLN: 3.0000 mL | RESPIRATORY_TRACT | Status: DC

## 2014-12-10 MED ORDER — ROFLUMILAST 500 MCG PO TABS
500.0000 ug | ORAL_TABLET | Freq: Every day | ORAL | Status: DC
  Administered 2014-12-10: 500 ug via ORAL
  Filled 2014-12-10: qty 1

## 2014-12-10 MED ORDER — HYDROCHLOROTHIAZIDE 25 MG PO TABS
25.0000 mg | ORAL_TABLET | Freq: Every day | ORAL | Status: DC
Start: 2014-12-10 — End: 2014-12-11

## 2014-12-10 MED ORDER — LORAZEPAM 2 MG/ML IJ SOLN
1.0000 mg | Freq: Once | INTRAMUSCULAR | Status: AC
  Administered 2014-12-10: 1 mg via INTRAVENOUS

## 2014-12-10 MED ORDER — LORATADINE 10 MG PO TABS
10.0000 mg | ORAL_TABLET | Freq: Every day | ORAL | Status: DC
  Administered 2014-12-10: 10 mg via ORAL
  Filled 2014-12-10: qty 1

## 2014-12-10 MED ORDER — ENOXAPARIN SODIUM 40 MG/0.4ML ~~LOC~~ SOLN
40.0000 mg | SUBCUTANEOUS | Status: DC
  Administered 2014-12-10: 40 mg via SUBCUTANEOUS
  Filled 2014-12-10: qty 0.4

## 2014-12-10 MED ORDER — MONTELUKAST SODIUM 10 MG PO TABS
10.0000 mg | ORAL_TABLET | Freq: Every evening | ORAL | Status: DC
  Administered 2014-12-10: 10 mg via ORAL
  Filled 2014-12-10: qty 1

## 2014-12-10 MED ORDER — ACETAMINOPHEN 325 MG PO TABS: 650.0000 mg | ORAL_TABLET | Freq: Four times a day (QID) | ORAL | Status: DC | PRN

## 2014-12-10 MED ORDER — ACETAMINOPHEN 650 MG RE SUPP: 650.0000 mg | Freq: Four times a day (QID) | RECTAL | Status: DC | PRN

## 2014-12-10 MED ORDER — METHYLPREDNISOLONE SODIUM SUCC 125 MG IJ SOLR
60.0000 mg | Freq: Four times a day (QID) | INTRAMUSCULAR | Status: DC
  Administered 2014-12-10 – 2014-12-11 (×2): 60 mg via INTRAVENOUS
  Filled 2014-12-10 (×2): qty 2

## 2014-12-10 MED ORDER — LORAZEPAM 0.5 MG PO TABS: 0.5000 mg | ORAL_TABLET | Freq: Three times a day (TID) | ORAL | Status: DC | PRN

## 2014-12-10 MED ORDER — HYDROCODONE-ACETAMINOPHEN 5-325 MG PO TABS: 1.0000 | ORAL_TABLET | ORAL | Status: DC | PRN

## 2014-12-10 MED ORDER — ENALAPRIL MALEATE 10 MG PO TABS
20.0000 mg | ORAL_TABLET | Freq: Two times a day (BID) | ORAL | Status: DC
  Administered 2014-12-10: 20 mg via ORAL
  Filled 2014-12-10: qty 2

## 2014-12-10 MED ORDER — ONDANSETRON HCL 4 MG/2ML IJ SOLN: 4.0000 mg | Freq: Four times a day (QID) | INTRAMUSCULAR | Status: DC | PRN

## 2014-12-10 MED ORDER — FAMOTIDINE 20 MG PO TABS
20.0000 mg | ORAL_TABLET | Freq: Two times a day (BID) | ORAL | Status: DC
  Administered 2014-12-10: 20 mg via ORAL
  Filled 2014-12-10: qty 1

## 2014-12-10 MED ORDER — CARVEDILOL 12.5 MG PO TABS
12.5000 mg | ORAL_TABLET | Freq: Two times a day (BID) | ORAL | Status: DC
  Administered 2014-12-11: 12.5 mg via ORAL
  Filled 2014-12-10: qty 1

## 2014-12-10 MED ORDER — SODIUM CHLORIDE 0.9 % IV SOLN: 250.0000 mL | INTRAVENOUS | Status: DC | PRN

## 2014-12-10 MED ORDER — MOMETASONE FURO-FORMOTEROL FUM 200-5 MCG/ACT IN AERO: 2.0000 | INHALATION_SPRAY | Freq: Two times a day (BID) | RESPIRATORY_TRACT | Status: DC

## 2014-12-10 MED ORDER — FLUTICASONE PROPIONATE 50 MCG/ACT NA SUSP
1.0000 | Freq: Every day | NASAL | Status: DC
  Administered 2014-12-10: 1 via NASAL
  Filled 2014-12-10: qty 16

## 2014-12-10 MED ORDER — AMLODIPINE BESYLATE 5 MG PO TABS: 5.0000 mg | ORAL_TABLET | Freq: Every day | ORAL | Status: DC

## 2014-12-10 MED ORDER — LEVOFLOXACIN IN D5W 500 MG/100ML IV SOLN
500.0000 mg | INTRAVENOUS | Status: DC
  Administered 2014-12-10: 500 mg via INTRAVENOUS
  Filled 2014-12-10 (×2): qty 100

## 2014-12-10 MED ORDER — CITALOPRAM HYDROBROMIDE 20 MG PO TABS
10.0000 mg | ORAL_TABLET | Freq: Every day | ORAL | Status: DC
  Filled 2014-12-10: qty 1

## 2014-12-10 MED ORDER — NITROGLYCERIN 0.4 MG SL SUBL: 0.4000 mg | SUBLINGUAL_TABLET | SUBLINGUAL | Status: DC | PRN

## 2014-12-10 MED ORDER — LEVALBUTEROL HCL 1.25 MG/0.5ML IN NEBU
1.2500 mg | INHALATION_SOLUTION | Freq: Four times a day (QID) | RESPIRATORY_TRACT | Status: DC
  Administered 2014-12-10 – 2014-12-11 (×3): 1.25 mg via RESPIRATORY_TRACT
  Filled 2014-12-10 (×4): qty 0.5

## 2014-12-10 MED ORDER — ALBUTEROL SULFATE (2.5 MG/3ML) 0.083% IN NEBU: 2.5000 mg | INHALATION_SOLUTION | RESPIRATORY_TRACT | Status: DC

## 2014-12-10 NOTE — Progress Notes (Signed)
eLink Physician-Brief Progress Note Patient Name: Julie Foley DOB: 1955/06/08 MRN: [id number]   Date of Service  12/10/2014  HPI/Events of Note  New admission  COPD exacerbation Stable on camera check  eICU Interventions  No eICU intervention     Intervention Category Evaluation Type: New Patient Evaluation  MCQUAID, DOUGLAS 12/10/2014, 10:25 PM

## 2014-12-10 NOTE — ED Notes (Signed)
Pt comes into the ED c/o of respiratory distress.  Patient found at home struggling to breath with use of accessory muscles and patient was diaphoretic.  H/o asthma, COPD, and HTN.  1 duoneb and 125 mg solumedrol given in the field.  Patient placed on CPAP with 100 % SpO2.  CBG 101, 170/110, 124 HR, 30 RR.  Bilateral wheezing.

## 2014-12-10 NOTE — H&P (Cosign Needed)
[facility] [place] at [facility]   PATIENT NAME: Julie Foley    MR#:  [id number]  DATE OF BIRTH:  1955-06-13  DATE OF ADMISSION:  12/10/2014  PRIMARY CARE PHYSICIAN: Pcp Not In System   REQUESTING/REFERRING PHYSICIAN: Steele Sizer  CHIEF COMPLAINT:   Chief Complaint  Patient presents with  . Respiratory Distress    HISTORY OF PRESENT ILLNESS: Julie Foley  is a 59 y.o. female with a known history of severe COPD on 4 L of oxygen at home who has had multiple ED visits and at admissions for COPD who actually was seen here on 9/03 and had to be placed on BiPAP in the emergency room then patient wanted to go home so she was discharged , earlier today develop shortness of breath and EMS was called when they arrived patient was noted to be in severe respiratory distress. They gave her DuoNeb's and and steroids and placed her on CPAP with improvement. On arrival to the ER she continued to have respiratory distress therefore she was placed on BiPAP. She continues to be on BiPAP. She denies any fevers or chills complains of a nonproductive cough.   PAST MEDICAL HISTORY:   Past Medical History  Diagnosis Date  . COPD (chronic obstructive pulmonary disease)   . CHF (congestive heart failure)   . HTN (hypertension)   . GERD (gastroesophageal reflux disease)   . Depression   . Anxiety     PAST SURGICAL HISTORY:  Past Surgical History  Procedure Laterality Date  . Tubal ligation Bilateral     SOCIAL HISTORY:  Social History  Substance Use Topics  . Smoking status: Former Smoker -- 2.00 packs/day for 35 years    Types: Cigarettes  . Smokeless tobacco: Not on file  . Alcohol Use: Yes    FAMILY HISTORY:  Family History  Problem Relation Age of Onset  . Cancer Mother   . Hypertension Mother   . Diabetes Mother   . Asthma Son     DRUG ALLERGIES:  Allergies  Allergen Reactions  . Budesonide-Formoterol Fumarate Swelling    REVIEW OF SYSTEMS:    CONSTITUTIONAL: No fever, fatigue or weakness.  EYES: No blurred or double vision.  EARS, NOSE, AND THROAT: No tinnitus or ear pain.  RESPIRATORY:Positive cough , shortness of breath, wheezing  ,no  hemoptysis.  CARDIOVASCULAR: No chest pain, orthopnea, edema.  GASTROINTESTINAL: No nausea, vomiting, diarrhea or abdominal pain.  GENITOURINARY: No dysuria, hematuria.  ENDOCRINE: No polyuria, nocturia,  HEMATOLOGY: No anemia, easy bruising or bleeding SKIN: No rash or lesion. MUSCULOSKELETAL: No joint pain or arthritis.   NEUROLOGIC: No tingling, numbness, weakness.  PSYCHIATRY: No anxiety or depression.   MEDICATIONS AT HOME:  Prior to Admission medications   Medication Sig Start Date End Date Taking? Authorizing Provider  albuterol (PROVENTIL) (2.5 MG/3ML) 0.083% nebulizer solution Take 2.5 mg by nebulization every 4 (four) hours as needed for wheezing or shortness of breath.    Historical Provider, MD  amLODipine (NORVASC) 5 MG tablet Take 5 mg by mouth daily.    Historical Provider, MD  azithromycin (ZITHROMAX) 250 MG tablet One tab daily for three days 10/09/14   Alford Highland, MD  beclomethasone (QVAR) 80 MCG/ACT inhaler Inhale 1 puff into the lungs 2 (two) times daily.    Historical Provider, MD  carvedilol (COREG) 12.5 MG tablet Take 1 tablet (12.5 mg total) by mouth 2 (two) times daily with a meal. 08/13/14   Adrian Saran, MD  citalopram (  CELEXA) 10 MG tablet Take 10 mg by mouth daily.    Historical Provider, MD  enalapril (VASOTEC) 20 MG tablet Take 20 mg by mouth 2 (two) times daily.     Historical Provider, MD  famotidine (PEPCID) 20 MG tablet Take 20 mg by mouth 2 (two) times daily.    Historical Provider, MD  fluticasone (FLONASE) 50 MCG/ACT nasal spray Place 1 spray into both nostrils daily.    Historical Provider, MD  Fluticasone-Salmeterol (ADVAIR) 500-50 MCG/DOSE AEPB Inhale 1 puff into the lungs 2 (two) times daily.    Historical Provider, MD  hydrochlorothiazide  (HYDRODIURIL) 25 MG tablet Take 25 mg by mouth daily.    Historical Provider, MD  ipratropium (ATROVENT) 0.02 % nebulizer solution Take 0.5 mg by nebulization 4 (four) times daily.    Historical Provider, MD  loratadine (CLARITIN) 10 MG tablet Take 10 mg by mouth daily.    Historical Provider, MD  LORazepam (ATIVAN) 0.5 MG tablet Take 1 tablet (0.5 mg total) by mouth every 8 (eight) hours as needed for anxiety. 10/09/14   Alford Highland, MD  montelukast (SINGULAIR) 10 MG tablet Take 10 mg by mouth every evening.    Historical Provider, MD  nitroGLYCERIN (NITROSTAT) 0.4 MG SL tablet Place 0.4 mg under the tongue every 5 (five) minutes as needed for chest pain.    Historical Provider, MD  predniSONE (DELTASONE) 10 MG tablet Take 5 tablets (50 mg total) by mouth daily. 12/01/14   Governor Rooks, MD  roflumilast (DALIRESP) 500 MCG TABS tablet Take 500 mcg by mouth daily.    Historical Provider, MD  tiotropium (SPIRIVA) 18 MCG inhalation capsule Place 18 mcg into inhaler and inhale daily.    Historical Provider, MD      PHYSICAL EXAMINATION:   VITAL SIGNS: Blood pressure 113/80, pulse 131, resp. rate 26, height 5\' 2"  (1.575 m), weight 78.926 kg (174 lb), SpO2 100 %.  GENERAL:  59 y.o.-year-old patient lying in the bed Critically ill-appearing and acute respiratory distress  EYES: Pupils equal, round, reactive to light and accommodation. No scleral icterus. Extraocular muscles intact.  HEENT: Head atraumatic, normocephalic. Oropharynx and nasopharynx clear.  NECK:  Supple, no jugular venous distention. No thyroid enlargement, no tenderness.  LUNGS:Decreased breath sounds bilaterally with rhonchi and wheezes throughout both lungs Cardiovascular: S1, S2 normal.Tachycardia  No murmurs, rubs, or gallops.  ABDOMEN: Soft, nontender, nondistended. Bowel sounds present. No organomegaly or mass.  EXTREMITIES: No pedal edema, cyanosis, or clubbing.  NEUROLOGIC: Cranial nerves II through XII are intact. Muscle  strength 5/5 in all extremities. Sensation intact. Gait not checked.  PSYCHIATRIC: The patient is alert and oriented x 3.  SKIN: No obvious rash, lesion, or ulcer.   LABORATORY PANEL:   CBC  Recent Labs Lab 12/10/14 1534  WBC 9.3  HGB 13.1  HCT 39.0  PLT 263  MCV 100.0  MCH 33.6  MCHC 33.6  RDW 13.1   ------------------------------------------------------------------------------------------------------------------  Chemistries   Recent Labs Lab 12/10/14 1534  NA 142  K 4.2  CL 105  CO2 31  GLUCOSE 111*  BUN 10  CREATININE 0.75  CALCIUM 9.1   ------------------------------------------------------------------------------------------------------------------ estimated creatinine clearance is 76.4 mL/min (by C-G formula based on Cr of 0.75). ------------------------------------------------------------------------------------------------------------------ No results for input(s): TSH, T4TOTAL, T3FREE, THYROIDAB in the last 72 hours.  Invalid input(s): FREET3   Coagulation profile No results for input(s): INR, PROTIME in the last 168 hours. ------------------------------------------------------------------------------------------------------------------- No results for input(s): DDIMER in the last 72 hours. -------------------------------------------------------------------------------------------------------------------  Cardiac  Enzymes  Recent Labs Lab 12/10/14 1534  TROPONINI <0.03   ------------------------------------------------------------------------------------------------------------------ Invalid input(s): POCBNP  ---------------------------------------------------------------------------------------------------------------  Urinalysis    Component Value Date/Time   COLORURINE Yellow 12/23/2013 0901   APPEARANCEUR Cloudy 12/23/2013 0901   LABSPEC 1.025 12/23/2013 0901   PHURINE 6.0 12/23/2013 0901   GLUCOSEU 50 mg/dL 75/64/3329 5188   HGBUR 3+  12/23/2013 0901   BILIRUBINUR Negative 12/23/2013 0901   KETONESUR Negative 12/23/2013 0901   PROTEINUR 100 mg/dL 41/66/0630 1601   NITRITE Negative 12/23/2013 0901   LEUKOCYTESUR Negative 12/23/2013 0901     RADIOLOGY: Dg Chest Port 1 View  12/10/2014   CLINICAL DATA:  Respiratory distress.  EXAM: PORTABLE CHEST - 1 VIEW  COMPARISON:  December 01, 2014.  FINDINGS: The heart size and mediastinal contours are within normal limits. Both lungs are clear. No pneumothorax or pleural effusion is noted. The visualized skeletal structures are unremarkable.  IMPRESSION: No acute cardiopulmonary abnormality seen.   Electronically Signed   By: Lupita Raider, M.D.   On: 12/10/2014 15:54    EKG: Orders placed or performed during the hospital encounter of 12/10/14  . EKG test  . EKG test    IMPRESSION AND PLAN: Patient is a 59 year old white female with history of severe COPD chronic respiratory failure presents with worsening respiratory failure  1. Acute on chronic respiratory failure due to acute on chronic COPD exasperation, as well as acute bronchitis at this time will treat her with nebulizers, steroids and IV Levaquin, check ABG continue BiPAP for now weaned to oxygen as tolerated.   2. Chronic diastolic congestive heart failure appears compensated monitor fluid status continue Coreg  3. Hypertension continue amlodipine and enalapril and hydrochlorothiazide  4. Miscellaneous:     All the records are reviewed and case discussed with ED provider. Management plans discussed with the patient, family and they are in agreement.  CODE STATUS:Full    TOTAL TIME TAKING CARE OF THIS PATIENT: 55 minutes of critical care time  Auburn Bilberry M.D on 12/10/2014 at 4:42 PM  Between 7am to 6pm - Pager - [phone]  After 6pm go to [email or web address] - password [email or web address]  [facility]  Office  [phone]  CC: Primary care physician; Pcp Not In System

## 2014-12-10 NOTE — ED Provider Notes (Signed)
Julie Foley Emergency Department Provider Note REMINDER - THIS NOTE IS NOT A FINAL MEDICAL RECORD UNTIL IT IS SIGNED. UNTIL THEN, THE CONTENT BELOW MAY REFLECT INFORMATION FROM A DOCUMENTATION TEMPLATE, NOT THE ACTUAL PATIENT VISIT. ____________________________________________  Time seen: Approximately 4:06 PM  I have reviewed the triage vital signs and the nursing notes.   HISTORY  Chief Complaint Respiratory Distress    HPI ESTEPHANIE Foley is a 59 y.o. female history of COPD, asthma, congestive heart failure. She reports she started feeling short of breath yesterday, her symptoms worsened throughout the day. She called 911 today because she could not breathe. EMS reports they found her in a tripod position, administer a DuoNeb, slight Medrol 125 and placed her on CPAP with improvement. On arrival to the ER she reports continued feel very short of breath and wheezing.  History, review of systems and physical are  limited by patient's acuity and respiratory distress.  Past Medical History  Diagnosis Date  . COPD (chronic obstructive pulmonary disease)   . CHF (congestive heart failure)   . HTN (hypertension)   . GERD (gastroesophageal reflux disease)   . Depression   . Anxiety     Patient Active Problem List   Diagnosis Date Noted  . Acute respiratory failure with hypoxemia 10/07/2014  . Anxiety 10/07/2014  . HTN (hypertension) 09/23/2014  . GERD (gastroesophageal reflux disease) 09/23/2014  . CHF (congestive heart failure) 09/23/2014  . COPD exacerbation 08/11/2014    Past Surgical History  Procedure Laterality Date  . Tubal ligation Bilateral     Current Outpatient Rx  Name  Route  Sig  Dispense  Refill  . albuterol (PROVENTIL) (2.5 MG/3ML) 0.083% nebulizer solution   Nebulization   Take 2.5 mg by nebulization every 4 (four) hours as needed for wheezing or shortness of breath.         Marland Kitchen amLODipine (NORVASC) 5 MG tablet   Oral   Take 5 mg  by mouth daily.         Marland Kitchen azithromycin (ZITHROMAX) 250 MG tablet      One tab daily for three days   3 each   0   . beclomethasone (QVAR) 80 MCG/ACT inhaler   Inhalation   Inhale 1 puff into the lungs 2 (two) times daily.         . carvedilol (COREG) 12.5 MG tablet   Oral   Take 1 tablet (12.5 mg total) by mouth 2 (two) times daily with a meal.   30 tablet   0   . citalopram (CELEXA) 10 MG tablet   Oral   Take 10 mg by mouth daily.         . enalapril (VASOTEC) 20 MG tablet   Oral   Take 20 mg by mouth 2 (two) times daily.          . famotidine (PEPCID) 20 MG tablet   Oral   Take 20 mg by mouth 2 (two) times daily.         . fluticasone (FLONASE) 50 MCG/ACT nasal spray   Each Nare   Place 1 spray into both nostrils daily.         . Fluticasone-Salmeterol (ADVAIR) 500-50 MCG/DOSE AEPB   Inhalation   Inhale 1 puff into the lungs 2 (two) times daily.         . hydrochlorothiazide (HYDRODIURIL) 25 MG tablet   Oral   Take 25 mg by mouth daily.         Marland Kitchen  ipratropium (ATROVENT) 0.02 % nebulizer solution   Nebulization   Take 0.5 mg by nebulization 4 (four) times daily.         Marland Kitchen loratadine (CLARITIN) 10 MG tablet   Oral   Take 10 mg by mouth daily.         Marland Kitchen LORazepam (ATIVAN) 0.5 MG tablet   Oral   Take 1 tablet (0.5 mg total) by mouth every 8 (eight) hours as needed for anxiety.   21 tablet   0   . montelukast (SINGULAIR) 10 MG tablet   Oral   Take 10 mg by mouth every evening.         . nitroGLYCERIN (NITROSTAT) 0.4 MG SL tablet   Sublingual   Place 0.4 mg under the tongue every 5 (five) minutes as needed for chest pain.         . predniSONE (DELTASONE) 10 MG tablet   Oral   Take 5 tablets (50 mg total) by mouth daily.   20 tablet   0   . roflumilast (DALIRESP) 500 MCG TABS tablet   Oral   Take 500 mcg by mouth daily.         Marland Kitchen tiotropium (SPIRIVA) 18 MCG inhalation capsule   Inhalation   Place 18 mcg into inhaler and  inhale daily.           Allergies Budesonide-formoterol fumarate  Family History  Problem Relation Age of Onset  . Cancer Mother   . Hypertension Mother   . Diabetes Mother   . Asthma Son     Social History Social History  Substance Use Topics  . Smoking status: Former Smoker -- 2.00 packs/day for 35 years    Types: Cigarettes  . Smokeless tobacco: None  . Alcohol Use: Yes    Review of Systems Constitutional: No fever/chills Eyes: No visual changes. ENT: No sore throat. Cardiovascular: Denies chest pain. Respiratory: Patient reports she is very short of breath and wheezing. ____________________________________________   PHYSICAL EXAM:  VITAL SIGNS: ED Triage Vitals  Enc Vitals Group     BP 12/10/14 1528 118/94 mmHg     Pulse Rate 12/10/14 1528 127     Resp 12/10/14 1528 26     Temp --      Temp src --      SpO2 12/10/14 1528 100 %     Weight 12/10/14 1528 174 lb (78.926 kg)     Height 12/10/14 1528  (1.575 m)     Head Cir --      Peak Flow --      Pain Score --      Pain Loc --      Pain Edu? --      Excl. in GC? --    Constitutional: Alert and oriented. Patient sitting upright, moderate increased work of breathing and able to speak in only very short one to 2 word phrases. Eyes: Conjunctivae are normal. PERRL. EOMI. Head: Atraumatic. Nose: No congestion/rhinnorhea. Mouth/Throat: Mucous membranes are slightly dry.  Oropharynx non-erythematous. Neck: No stridor.   Cardiovascular: Tachycardic, regular rhythm. Grossly normal heart sounds.  Good peripheral circulation. Respiratory: Moderate increased work of breathing, on CPAP, and expiratory wheezes throughout. No Rales. The patient does not appear to be fatiguing. She does appear slightly anxious. Gastrointestinal: Soft and nontender. No distention. No abdominal bruits. No CVA tenderness. Musculoskeletal: No lower extremity tenderness nor edema.  No joint effusions. Neurologic:  Normal speech and  language. No gross focal neurologic deficits  are appreciated. Skin:  Skin is warm, dry and intact. No rash noted. Psychiatric: Mood and affect are anxious  ____________________________________________   LABS (all labs ordered are listed, but only abnormal results are displayed)  Labs Reviewed  BASIC METABOLIC PANEL - Abnormal; Notable for the following:    Glucose, Bld 111 (*)    All other components within normal limits  CBC  TROPONIN I  BRAIN NATRIURETIC PEPTIDE   ____________________________________________  EKG  Reviewed and interpreted by me Sinus tachycardia Ventricular rate 120 PR 1:30 QRS 68 QTc 452 Reviewed and interpreted as sinus tachycardia with nonspecific T-wave abnormality, though there does appear to be some probable depressions in the lateral leads. No acute ST elevation ____________________________________________  RADIOLOGY  DG Chest Port 1 View (Final result) Result time: 12/10/14 15:54:31   Final result by Rad Results In Interface (12/10/14 15:54:31)   Narrative:   CLINICAL DATA: Respiratory distress.  EXAM: PORTABLE CHEST - 1 VIEW  COMPARISON: December 01, 2014.  FINDINGS: The heart size and mediastinal contours are within normal limits. Both lungs are clear. No pneumothorax or pleural effusion is noted. The visualized skeletal structures are unremarkable.  IMPRESSION: No acute cardiopulmonary abnormality seen.     ____________________________________________   PROCEDURES  Procedure(s) performed: None  Critical Care performed: Yes, see critical care note(s)  CRITICAL CARE Performed by: Sharyn Creamer   Total critical care time: 35  Critical care time was exclusive of separately billable procedures and treating other patients.  Critical care was necessary to treat or prevent imminent or life-threatening deterioration.  Critical care was time spent personally by me on the following activities: development of treatment plan  with patient and/or surrogate as well as nursing, discussions with consultants, evaluation of patient's response to treatment, examination of patient, obtaining history from patient or surrogate, ordering and performing treatments and interventions, ordering and review of laboratory studies, ordering and review of radiographic studies, pulse oximetry and re-evaluation of patient's condition.  ____________________________________________   INITIAL IMPRESSION / ASSESSMENT AND PLAN / ED COURSE  Pertinent labs & imaging results that were available during my care of the patient were reviewed by me and considered in my medical decision making (see chart for details).    ----------------------------------------- 4:29 PM on 12/10/2014 -----------------------------------------  The patient reports improvement. She is currently resting and tolerating BiPAP very well. So wheezing in lung fields, but overall improved. No hypoxia. Discussed with patient and the hospitalist service, we'll admit the patient for ongoing care of COPD exacerbation. Patient initially expressed that she wished to leave, but in the setting of still being on BiPAP, I further discussed with her that her heart rate was still elevated but she still wheezing and she reports that she is willing to stay for hospitalization as I reported to at high risk for worsening or death should she leave, the patient agreeable with admission. ____________________________________________   FINAL CLINICAL IMPRESSION(S) / ED DIAGNOSES  Final diagnoses:  COPD with acute exacerbation      Sharyn Creamer, MD 12/10/14 [phone]

## 2014-12-11 LAB — CBC
HEMATOCRIT: 37.5 % (ref 35.0–47.0)
Hemoglobin: 12.3 g/dL (ref 12.0–16.0)
MCH: 33.4 pg (ref 26.0–34.0)
MCHC: 32.9 g/dL (ref 32.0–36.0)
MCV: 101.6 fL — AB (ref 80.0–100.0)
PLATELETS: 256 10*3/uL (ref 150–440)
RBC: 3.69 MIL/uL — AB (ref 3.80–5.20)
RDW: 13.2 % (ref 11.5–14.5)
WBC: 7.5 10*3/uL (ref 3.6–11.0)

## 2014-12-11 LAB — BASIC METABOLIC PANEL
ANION GAP: 7 (ref 5–15)
BUN: 12 mg/dL (ref 6–20)
CO2: 30 mmol/L (ref 22–32)
Calcium: 9.5 mg/dL (ref 8.9–10.3)
Chloride: 105 mmol/L (ref 101–111)
Creatinine, Ser: 0.77 mg/dL (ref 0.44–1.00)
GLUCOSE: 119 mg/dL — AB (ref 65–99)
POTASSIUM: 4.2 mmol/L (ref 3.5–5.1)
Sodium: 142 mmol/L (ref 135–145)

## 2014-12-11 MED ORDER — LEVOFLOXACIN 500 MG PO TABS: 500.0000 mg | ORAL_TABLET | Freq: Every day | ORAL | Status: DC

## 2014-12-11 MED ORDER — LORAZEPAM 0.5 MG PO TABS: 0.5000 mg | ORAL_TABLET | Freq: Three times a day (TID) | ORAL | Status: DC | PRN

## 2014-12-11 MED ORDER — PREDNISONE 10 MG (21) PO TBPK: 10.0000 mg | ORAL_TABLET | Freq: Every day | ORAL | Status: DC

## 2014-12-11 NOTE — Plan of Care (Signed)
Problem: Phase I Progression Outcomes Goal: Pt OOB to Walk or Exercise Daily With Nursing or PT Patient OOB to walk or exercise daily with nursing or PT if activity order permits  Outcome: Completed/Met Date Met:  12/11/14 Walked AM 12/11/2014 with O2 at home level of 4 L, no signs of distress.

## 2014-12-11 NOTE — Discharge Summary (Signed)
[facility] [place] at [facility]   PATIENT NAME: Julie Foley    MR#:  [id number]  DATE OF BIRTH:  10-Dec-1955  DATE OF ADMISSION:  12/10/2014 ADMITTING PHYSICIAN: Auburn Bilberry, MD  DATE OF DISCHARGE: 12/11/14  PRIMARY CARE PHYSICIAN: [facility]   ADMISSION DIAGNOSIS:  COPD with acute exacerbation [J44.1]  DISCHARGE DIAGNOSIS:  Active Problems:   Acute respiratory failure   SECONDARY DIAGNOSIS:   Past Medical History  Diagnosis Date  . COPD (chronic obstructive pulmonary disease)   . CHF (congestive heart failure)   . HTN (hypertension)   . GERD (gastroesophageal reflux disease)   . Depression   . Anxiety     HOSPITAL COURSE:   59 year old African-American female with known history of COPD on 4 L home oxygen, anxiety, hypertension, diastolic CHF who has had multiple ER visits and multiple hospitalizations in the past presents with difficulty breathing.  #1 acute respiratory failure-secondary to acute on chronic COPD exacerbation. -Started on BiPAP last night, seems like that helps each time she comes in. -Very anxious to go back home today. Minimal wheezing, moving air bilaterally. Back on 4 L oxygen and ambulating in the room well. -We'll check with care manager to set up BiPAP at home if possible. -Discharge on 2 week prednisone taper. Continue Levaquin to finish off course for her acute bronchitis symptoms. -Continue nebulizer at home, also on multiple home inhalers. Recommended strict outpatient follow-up to avoid multiple hospitalizations. -Continue home oxygen.  #2 chronic diastolic congestive heart failure-appears well compensated. Not on dietary takes at home. Continue to monitor.  #3-hypertension-continue home dose of amlodipine, enalapril and hydrochlorothiazide.  #4 depression and anxiety-on Celexa and also Ativan by mouth.  Will discharge today.   DISCHARGE CONDITIONS:   Guarded  CONSULTS OBTAINED:  Treatment Team:   Auburn Bilberry, MD Merwyn Katos, MD  DRUG ALLERGIES:   Allergies  Allergen Reactions  . Budesonide-Formoterol Fumarate Swelling    DISCHARGE MEDICATIONS:   Current Discharge Medication List    START taking these medications   Details  levofloxacin (LEVAQUIN) 500 MG tablet Take 1 tablet (500 mg total) by mouth daily. Qty: 5 tablet, Refills: 0    predniSONE (STERAPRED UNI-PAK 21 TAB) 10 MG (21) TBPK tablet Take 1 tablet (10 mg total) by mouth daily. 6 tabs PO x 2 days 5 tabs PO x 2 days 4 tabs PO x 2 days 3 tabs PO x 2days 2 tabs PO x 2days 1 tab PO x 2 days and stop Qty: 42 tablet, Refills: 0      CONTINUE these medications which have CHANGED   Details  LORazepam (ATIVAN) 0.5 MG tablet Take 1 tablet (0.5 mg total) by mouth every 8 (eight) hours as needed for anxiety. Qty: 10 tablet, Refills: 0      CONTINUE these medications which have NOT CHANGED   Details  albuterol (PROVENTIL) (2.5 MG/3ML) 0.083% nebulizer solution Take 2.5 mg by nebulization every 4 (four) hours as needed for wheezing or shortness of breath.    amLODipine (NORVASC) 5 MG tablet Take 5 mg by mouth daily.    beclomethasone (QVAR) 80 MCG/ACT inhaler Inhale 1 puff into the lungs 2 (two) times daily.    carvedilol (COREG) 12.5 MG tablet Take 1 tablet (12.5 mg total) by mouth 2 (two) times daily with a meal. Qty: 30 tablet, Refills: 0    citalopram (CELEXA) 10 MG tablet Take 10 mg by mouth daily.    enalapril (VASOTEC) 20 MG tablet Take  20 mg by mouth 2 (two) times daily.     famotidine (PEPCID) 20 MG tablet Take 20 mg by mouth 2 (two) times daily.    fluticasone (FLONASE) 50 MCG/ACT nasal spray Place 1 spray into both nostrils daily.    Fluticasone-Salmeterol (ADVAIR) 500-50 MCG/DOSE AEPB Inhale 1 puff into the lungs 2 (two) times daily.    hydrochlorothiazide (HYDRODIURIL) 25 MG tablet Take 25 mg by mouth daily.    ipratropium (ATROVENT) 0.02 % nebulizer solution Take 0.5 mg by nebulization 4  (four) times daily.    loratadine (CLARITIN) 10 MG tablet Take 10 mg by mouth daily.    montelukast (SINGULAIR) 10 MG tablet Take 10 mg by mouth every evening.    nitroGLYCERIN (NITROSTAT) 0.4 MG SL tablet Place 0.4 mg under the tongue every 5 (five) minutes as needed for chest pain.    roflumilast (DALIRESP) 500 MCG TABS tablet Take 500 mcg by mouth daily.    tiotropium (SPIRIVA) 18 MCG inhalation capsule Place 18 mcg into inhaler and inhale daily.      STOP taking these medications     azithromycin (ZITHROMAX) 250 MG tablet      azithromycin (ZITHROMAX) 250 MG tablet          DISCHARGE INSTRUCTIONS:   1. Resume home health services 2. Continue 4l home oxygen 3. PCP f/u in 1 week  If you experience worsening of your admission symptoms, develop shortness of breath, life threatening emergency, suicidal or homicidal thoughts you must seek medical attention immediately by calling 911 or calling your MD immediately  if symptoms less severe.  You Must read complete instructions/literature along with all the possible adverse reactions/side effects for all the Medicines you take and that have been prescribed to you. Take any new Medicines after you have completely understood and accept all the possible adverse reactions/side effects.   Please note  You were cared for by a hospitalist during your hospital stay. If you have any questions about your discharge medications or the care you received while you were in the hospital after you are discharged, you can call the unit and asked to speak with the hospitalist on call if the hospitalist that took care of you is not available. Once you are discharged, your primary care physician will handle any further medical issues. Please note that NO REFILLS for any discharge medications will be authorized once you are discharged, as it is imperative that you return to your primary care physician (or establish a relationship with a primary care  physician if you do not have one) for your aftercare needs so that they can reassess your need for medications and monitor your lab values.    Today   CHIEF COMPLAINT:   Chief Complaint  Patient presents with  . Respiratory Distress    VITAL SIGNS:  Blood pressure 153/83, pulse 83, temperature 98.2 F (36.8 C), temperature source Oral, resp. rate 17, height  (1.575 m), weight 76.8 kg (169 lb 5 oz), SpO2 100 %.  I/O:   Intake/Output Summary (Last 24 hours) at 12/11/14 0920 Last data filed at 12/11/14 0820  Gross per 24 hour  Intake   1303 ml  Output      0 ml  Net   1303 ml    PHYSICAL EXAMINATION:   Physical Exam  GENERAL:  59 y.o.-year-old patient lying in the bed with no acute distress. Anxious appearing. EYES: Pupils equal, round, reactive to light and accommodation. No scleral icterus. Extraocular muscles intact.  HEENT: Head atraumatic, normocephalic. Oropharynx and nasopharynx clear.  NECK:  Supple, no jugular venous distention. No thyroid enlargement, no tenderness.  LUNGS: Normal breath sounds bilaterally, no rales,rhonchi or crepitation. No use of accessory muscles of respiration. Minimal scattered expiratory wheezing posteriorly. CARDIOVASCULAR: S1, S2 normal. No murmurs, rubs, or gallops.  ABDOMEN: Soft, non-tender, non-distended. Bowel sounds present. No organomegaly or mass.  EXTREMITIES: No pedal edema, cyanosis, or clubbing.  NEUROLOGIC: Cranial nerves II through XII are intact. Muscle strength 5/5 in all extremities. Sensation intact. Gait not checked.  PSYCHIATRIC: The patient is alert and oriented x 3.  SKIN: No obvious rash, lesion, or ulcer.   DATA REVIEW:   CBC  Recent Labs Lab 12/11/14 0409  WBC 7.5  HGB 12.3  HCT 37.5  PLT 256    Chemistries   Recent Labs Lab 12/11/14 0409  NA 142  K 4.2  CL 105  CO2 30  GLUCOSE 119*  BUN 12  CREATININE 0.77  CALCIUM 9.5    Cardiac Enzymes  Recent Labs Lab 12/10/14 1534  TROPONINI  <0.03    Microbiology Results  Results for orders placed or performed during the hospital encounter of 12/10/14  MRSA PCR Screening     Status: None   Collection Time: 12/10/14  9:30 PM  Result Value Ref Range Status   MRSA by PCR NEGATIVE NEGATIVE Final    Comment:        The GeneXpert MRSA Assay (FDA approved for NASAL specimens only), is one component of a comprehensive MRSA colonization surveillance program. It is not intended to diagnose MRSA infection nor to guide or monitor treatment for MRSA infections.     RADIOLOGY:  Dg Chest Port 1 View  12/10/2014   CLINICAL DATA:  Respiratory distress.  EXAM: PORTABLE CHEST - 1 VIEW  COMPARISON:  December 01, 2014.  FINDINGS: The heart size and mediastinal contours are within normal limits. Both lungs are clear. No pneumothorax or pleural effusion is noted. The visualized skeletal structures are unremarkable.  IMPRESSION: No acute cardiopulmonary abnormality seen.   Electronically Signed   By: Lupita Raider, M.D.   On: 12/10/2014 15:54    EKG:   Orders placed or performed during the hospital encounter of 12/10/14  . EKG test  . EKG test  . EKG 12-Lead  . EKG 12-Lead      Management plans discussed with the patient, family and they are in agreement.  CODE STATUS:     Code Status Orders        Start     Ordered   12/10/14 2132  Full code   Continuous     12/10/14 2131      TOTAL TIME TAKING CARE OF THIS PATIENT: 37  minutes.    Enid Baas M.D on 12/11/2014 at 9:20 AM  Between 7am to 6pm - Pager - [phone]  After 6pm go to [email or web address] - password [email or web address]  [facility]  Office  [phone]  CC: Primary care physician; Pcp Not In System

## 2014-12-11 NOTE — Care Management Note (Addendum)
Case Management Note  Patient Details  Name: Julie Foley MRN: [id number] Date of Birth: 28-Jun-1955  Subjective/Objective:   Referral for COPD patient with recurrent ED admission and hospitalizations. Patient presents from home where she lives with her spouse. She ambulates without difficult around the ICU. Patient active with PCS services though [facility] home health services which provide her with 3 hours per day. HRI referral made to [facility] following approval from Dr. Osborne Oman, requested nursing and SW. PCP is Dr. Joie Bimler at [facility], Pulmonologist is Lavella Hammock at [facility]. She was made COPD Gold prior to discharge. DR. Mack Hook requested home bipap. She is unable to tolerate a CPAP. Referral made to [facility] for home BiPAP. Order received and given to Will with [facility] for home over night oximetry.  Home health orders, face to face and  Discharge summary emailed to Rennis Chris with [facility]                 Action/Plan: Home with home health through [place], [place] program  Expected Discharge Date:  12/11/14               Expected Discharge Plan:  Home w Home Health Services  In-House Referral:     Discharge planning Services  CM Consult  Post Acute Care Choice:  Home Health Choice offered to:  Patient  DME Arranged:    DME Agency:     HH Arranged:  RN, Disease Management, SW HH Agency:  [facility]  Status of Service:  Completed, signed off  Medicare Important Message Given:    Date Medicare IM Given:    Medicare IM give by:    Date Additional Medicare IM Given:    Additional Medicare Important Message give by:     If discussed at Long Length of Stay Meetings, dates discussed:    Additional Comments:  [name], RN 12/11/2014, 10:41 AM

## 2014-12-11 NOTE — Progress Notes (Signed)
Patient discharged. Discharge instructions reviewed. Patient stated she already was a part of COPD Gold and refused another packet and sign up. Patient refused heart failure packet stated she already had one. Patient refused MyChart signup as she stated she was already signed up and would check when she went home. Patient refused medications from RN, stated she will take her medications at home. Educated about new medications. Care manager spoke with patient and set up home care services towards getting patient home BIPAP. Questions answered. Patient left on home O2, her ride brought her tank.

## 2014-12-11 NOTE — Plan of Care (Signed)
Problem: ICU Phase Progression Outcomes Goal: Flu/PneumoVaccines if indicated Outcome: Not Applicable Date Met:  92/90/90 Flu vaccine already given per admission screening.

## 2014-12-23 ENCOUNTER — Emergency Department: Payer: Medicaid Other

## 2014-12-23 ENCOUNTER — Emergency Department
Admission: EM | Admit: 2014-12-23 | Discharge: 2014-12-23 | Disposition: A | Discharge status: Home / Self Care | Payer: Medicaid Other | Attending: Emergency Medicine | Admitting: Emergency Medicine

## 2014-12-23 DIAGNOSIS — Z7951 Long term (current) use of inhaled steroids: Secondary | ICD-10-CM | POA: Diagnosis not present

## 2014-12-23 DIAGNOSIS — R000 Tachycardia, unspecified: Secondary | ICD-10-CM | POA: Diagnosis not present

## 2014-12-23 DIAGNOSIS — Z792 Long term (current) use of antibiotics: Secondary | ICD-10-CM | POA: Insufficient documentation

## 2014-12-23 DIAGNOSIS — Z87891 Personal history of nicotine dependence: Secondary | ICD-10-CM | POA: Diagnosis not present

## 2014-12-23 DIAGNOSIS — Z79899 Other long term (current) drug therapy: Secondary | ICD-10-CM | POA: Insufficient documentation

## 2014-12-23 DIAGNOSIS — Z9989 Dependence on other enabling machines and devices: Secondary | ICD-10-CM | POA: Diagnosis not present

## 2014-12-23 DIAGNOSIS — J441 Chronic obstructive pulmonary disease with (acute) exacerbation: Secondary | ICD-10-CM | POA: Diagnosis not present

## 2014-12-23 DIAGNOSIS — R0602 Shortness of breath: Secondary | ICD-10-CM | POA: Diagnosis present

## 2014-12-23 DIAGNOSIS — I10 Essential (primary) hypertension: Secondary | ICD-10-CM | POA: Diagnosis not present

## 2014-12-23 LAB — CBC WITH DIFFERENTIAL/PLATELET
BASOS ABS: 0.1 10*3/uL (ref 0–0.1)
BASOS PCT: 1 %
Eosinophils Absolute: 0.6 10*3/uL (ref 0–0.7)
Eosinophils Relative: 8 %
HEMATOCRIT: 42.2 % (ref 35.0–47.0)
Hemoglobin: 13.7 g/dL (ref 12.0–16.0)
Lymphocytes Relative: 23 %
Lymphs Abs: 1.6 10*3/uL (ref 1.0–3.6)
MCH: 32.7 pg (ref 26.0–34.0)
MCHC: 32.4 g/dL (ref 32.0–36.0)
MCV: 100.9 fL — ABNORMAL HIGH (ref 80.0–100.0)
MONO ABS: 0.5 10*3/uL (ref 0.2–0.9)
Monocytes Relative: 7 %
NEUTROS ABS: 4.3 10*3/uL (ref 1.4–6.5)
NEUTROS PCT: 61 %
Platelets: 342 10*3/uL (ref 150–440)
RBC: 4.18 MIL/uL (ref 3.80–5.20)
RDW: 13.6 % (ref 11.5–14.5)
WBC: 7 10*3/uL (ref 3.6–11.0)

## 2014-12-23 LAB — COMPREHENSIVE METABOLIC PANEL
ALK PHOS: 53 U/L (ref 38–126)
ALT: 25 U/L (ref 14–54)
ANION GAP: 5 (ref 5–15)
AST: 25 U/L (ref 15–41)
Albumin: 4.3 g/dL (ref 3.5–5.0)
BILIRUBIN TOTAL: 0.8 mg/dL (ref 0.3–1.2)
BUN: 15 mg/dL (ref 6–20)
CALCIUM: 9.1 mg/dL (ref 8.9–10.3)
CO2: 31 mmol/L (ref 22–32)
Chloride: 105 mmol/L (ref 101–111)
Creatinine, Ser: 0.77 mg/dL (ref 0.44–1.00)
GFR calc Af Amer: >60 mL/min (ref >60)
GLUCOSE: 137 mg/dL — AB (ref 65–99)
Potassium: 4.4 mmol/L (ref 3.5–5.1)
Sodium: 141 mmol/L (ref 135–145)
TOTAL PROTEIN: 6.8 g/dL (ref 6.5–8.1)

## 2014-12-23 LAB — MAGNESIUM: MAGNESIUM: 2.2 mg/dL (ref 1.7–2.4)

## 2014-12-23 LAB — TROPONIN I: Troponin I: <0.03 ng/mL (ref <0.031)

## 2014-12-23 MED ORDER — ALBUTEROL SULFATE (2.5 MG/3ML) 0.083% IN NEBU
INHALATION_SOLUTION | RESPIRATORY_TRACT | Status: AC
  Administered 2014-12-23: 17:00:00
  Filled 2014-12-23: qty 6

## 2014-12-23 MED ORDER — METHYLPREDNISOLONE SODIUM SUCC 125 MG IJ SOLR
125.0000 mg | Freq: Once | INTRAMUSCULAR | Status: AC
  Administered 2014-12-23: 125 mg via INTRAVENOUS

## 2014-12-23 MED ORDER — ALBUTEROL (5 MG/ML) CONTINUOUS INHALATION SOLN
5.0000 mg/h | INHALATION_SOLUTION | Freq: Once | RESPIRATORY_TRACT | Status: AC
  Administered 2014-12-23: 5 mg/h via RESPIRATORY_TRACT

## 2014-12-23 MED ORDER — SODIUM CHLORIDE 0.9 % IV BOLUS (SEPSIS): 500.0000 mL | INTRAVENOUS | Status: DC

## 2014-12-23 MED ORDER — PREDNISONE 20 MG PO TABS: 60.0000 mg | ORAL_TABLET | Freq: Every day | ORAL | Status: DC

## 2014-12-23 MED ORDER — METHYLPREDNISOLONE SODIUM SUCC 125 MG IJ SOLR
INTRAMUSCULAR | Status: AC
  Administered 2014-12-23: 125 mg via INTRAVENOUS
  Filled 2014-12-23: qty 2

## 2014-12-23 MED ORDER — ALBUTEROL SULFATE (2.5 MG/3ML) 0.083% IN NEBU: 2.5000 mg | INHALATION_SOLUTION | RESPIRATORY_TRACT | Status: DC | PRN

## 2014-12-23 MED ORDER — LORAZEPAM 0.5 MG PO TABS: 0.5000 mg | ORAL_TABLET | Freq: Three times a day (TID) | ORAL | Status: DC | PRN

## 2014-12-23 MED ORDER — IPRATROPIUM-ALBUTEROL 0.5-2.5 (3) MG/3ML IN SOLN
3.0000 mL | Freq: Once | RESPIRATORY_TRACT | Status: AC
  Administered 2014-12-23: 3 mL via RESPIRATORY_TRACT
  Filled 2014-12-23: qty 3

## 2014-12-23 NOTE — ED Notes (Signed)
Pt placed on 4 liters [place] to see if she tolerates. Pt is wanting to be discharged. Wants to make sure she gets another rx for levaquin, xanax and duonebs. The doctor that discharged her a few days ago did not sign the rx's for them and she has not called that doctor to fix them.

## 2014-12-23 NOTE — Discharge Instructions (Signed)
We believe that your symptoms are caused today by an exacerbation of your COPD and from not being able to use your albuterol.  Please take the prescribed medications and any medications that you have at home for your COPD.  Follow up with your doctor as recommended.  If you develop any new or worsening symptoms, including but not limited to fever, persistent vomiting, worsening shortness of breath, or other symptoms that concern you, please return to the Emergency Department immediately.   Chronic Obstructive Pulmonary Disease Chronic obstructive pulmonary disease (COPD) is a common lung condition in which airflow from the lungs is limited. COPD is a general term that can be used to describe many different lung problems that limit airflow, including both chronic bronchitis and emphysema. If you have COPD, your lung function will probably never return to normal, but there are measures you can take to improve lung function and make yourself feel better.  CAUSES   Smoking (common).   Exposure to secondhand smoke.   Genetic problems.  Chronic inflammatory lung diseases or recurrent infections. SYMPTOMS   Shortness of breath, especially with physical activity.   Deep, persistent (chronic) cough with a large amount of thick mucus.   Wheezing.   Rapid breaths (tachypnea).   Gray or bluish discoloration (cyanosis) of the skin, especially in fingers, toes, or lips.   Fatigue.   Weight loss.   Frequent infections or episodes when breathing symptoms become much worse (exacerbations).   Chest tightness. DIAGNOSIS  Your health care provider will take a medical history and perform a physical examination to make the initial diagnosis. Additional tests for COPD may include:   Lung (pulmonary) function tests.  Chest X-ray.  CT scan.  Blood tests. TREATMENT  Treatment available to help you feel better when you have COPD includes:   Inhaler and nebulizer medicines. These help  manage the symptoms of COPD and make your breathing more comfortable.  Supplemental oxygen. Supplemental oxygen is only helpful if you have a low oxygen level in your blood.   Exercise and physical activity. These are beneficial for nearly all people with COPD. Some people may also benefit from a pulmonary rehabilitation program. HOME CARE INSTRUCTIONS   Take all medicines (inhaled or pills) as directed by your health care provider.  Avoid over-the-counter medicines or cough syrups that dry up your airway (such as antihistamines) and slow down the elimination of secretions unless instructed otherwise by your health care provider.   If you are a smoker, the most important thing that you can do is stop smoking. Continuing to smoke will cause further lung damage and breathing trouble. Ask your health care provider for help with quitting smoking. He or she can direct you to community resources or hospitals that provide support.  Avoid exposure to irritants such as smoke, chemicals, and fumes that aggravate your breathing.  Use oxygen therapy and pulmonary rehabilitation if directed by your health care provider. If you require home oxygen therapy, ask your health care provider whether you should purchase a pulse oximeter to measure your oxygen level at home.   Avoid contact with individuals who have a contagious illness.  Avoid extreme temperature and humidity changes.  Eat healthy foods. Eating smaller, more frequent meals and resting before meals may help you maintain your strength.  Stay active, but balance activity with periods of rest. Exercise and physical activity will help you maintain your ability to do things you want to do.  Preventing infection and hospitalization is very important  when you have COPD. Make sure to receive all the vaccines your health care provider recommends, especially the pneumococcal and influenza vaccines. Ask your health care provider whether you need a  pneumonia vaccine.  Learn and use relaxation techniques to manage stress.  Learn and use controlled breathing techniques as directed by your health care provider. Controlled breathing techniques include:   Pursed lip breathing. Start by breathing in (inhaling) through your nose for 1 second. Then, purse your lips as if you were going to whistle and breathe out (exhale) through the pursed lips for 2 seconds.   Diaphragmatic breathing. Start by putting one hand on your abdomen just above your waist. Inhale slowly through your nose. The hand on your abdomen should move out. Then purse your lips and exhale slowly. You should be able to feel the hand on your abdomen moving in as you exhale.   Learn and use controlled coughing to clear mucus from your lungs. Controlled coughing is a series of short, progressive coughs. The steps of controlled coughing are:  1. Lean your head slightly forward.  2. Breathe in deeply using diaphragmatic breathing.  3. Try to hold your breath for 3 seconds.  4. Keep your mouth slightly open while coughing twice.  5. Spit any mucus out into a tissue.  6. Rest and repeat the steps once or twice as needed. SEEK MEDICAL CARE IF:   You are coughing up more mucus than usual.   There is a change in the color or thickness of your mucus.   Your breathing is more labored than usual.   Your breathing is faster than usual.  SEEK IMMEDIATE MEDICAL CARE IF:   You have shortness of breath while you are resting.   You have shortness of breath that prevents you from:  Being able to talk.   Performing your usual physical activities.   You have chest pain lasting longer than 5 minutes.   Your skin color is more cyanotic than usual.  You measure low oxygen saturations for longer than 5 minutes with a pulse oximeter. MAKE SURE YOU:   Understand these instructions.  Will watch your condition.  Will get help right away if you are not doing well or get  worse. Document Released: 12/25/2004 Document Revised: 08/01/2013 Document Reviewed: 11/11/2012 [facility] Patient Information 2015 [place], [place]. This information is not intended to replace advice given to you by your health care provider. Make sure you discuss any questions you have with your health care provider.

## 2014-12-23 NOTE — ED Notes (Signed)
Was unable to fill her albuterol rx - feeling sob for a few hours

## 2014-12-23 NOTE — ED Provider Notes (Signed)
[facility] Provider Note  ____________________________________________  Time seen: Approximately 4:39 PM  I have reviewed the triage vital signs and the nursing notes.   HISTORY  Chief Complaint Shortness of Breath  History is limited by severe shortness of breath and inability to only speak in several word phrases.  HPI Julie Foley is a 59 y.o. female with a history of severe COPD on 4 L of oxygen at baseline as well as some history of medical noncompliance procedures most of her care at [facility] although she has had multiple emergency department visits and hospitalizations here at [facility].  She presents today by ambulance emergency traffic for shortness of breath.  She reports that this is been a gradual onset worsening for several days.  After her last hospitalization several weeks ago she states that she was not able to have her Ativan prescription or her Levaquin prescriptions filled, and she is out of albuterol.  She has been taking her ipratropium but has had no albuterol for days.  She denies chest pain but states the shortness of breath is severe and feels similar to her prior exacerbations.Exertion makes it worse.  Denies N/V/D and dysuria.  Denies fever/chills.  EMS found her with oxygen saturation in the mid 80s in severe distress.  She improved significantly after 2 DuoNeb labs in route to the hospital.  They could not obtain IV access so she did not receive any other medications prior to arrival.     Past Medical History  Diagnosis Date  . COPD (chronic obstructive pulmonary disease)   . CHF (congestive heart failure)   . HTN (hypertension)   . GERD (gastroesophageal reflux disease)   . Depression   . Anxiety     Patient Active Problem List   Diagnosis Date Noted  . Acute respiratory failure 12/10/2014  . Acute respiratory failure with hypoxemia 10/07/2014  . Anxiety 10/07/2014  . HTN  (hypertension) 09/23/2014  . GERD (gastroesophageal reflux disease) 09/23/2014  . CHF (congestive heart failure) 09/23/2014  . COPD exacerbation 08/11/2014    Past Surgical History  Procedure Laterality Date  . Tubal ligation Bilateral     Current Outpatient Rx  Name  Route  Sig  Dispense  Refill  . albuterol (PROVENTIL) (2.5 MG/3ML) 0.083% nebulizer solution   Nebulization   Take 3 mLs (2.5 mg total) by nebulization every 4 (four) hours as needed for wheezing or shortness of breath.   75 mL   2   . amLODipine (NORVASC) 5 MG tablet   Oral   Take 5 mg by mouth daily.         . beclomethasone (QVAR) 80 MCG/ACT inhaler   Inhalation   Inhale 1 puff into the lungs 2 (two) times daily.         . carvedilol (COREG) 12.5 MG tablet   Oral   Take 1 tablet (12.5 mg total) by mouth 2 (two) times daily with a meal.   30 tablet   0   . citalopram (CELEXA) 10 MG tablet   Oral   Take 10 mg by mouth daily.         . enalapril (VASOTEC) 20 MG tablet   Oral   Take 20 mg by mouth 2 (two) times daily.          . famotidine (PEPCID) 20 MG tablet   Oral   Take 20 mg by mouth 2 (two) times daily.         Marland Kitchen  fluticasone (FLONASE) 50 MCG/ACT nasal spray   Each Nare   Place 1 spray into both nostrils daily.         . Fluticasone-Salmeterol (ADVAIR) 500-50 MCG/DOSE AEPB   Inhalation   Inhale 1 puff into the lungs 2 (two) times daily.         . hydrochlorothiazide (HYDRODIURIL) 25 MG tablet   Oral   Take 25 mg by mouth daily.         Marland Kitchen ipratropium (ATROVENT) 0.02 % nebulizer solution   Nebulization   Take 0.5 mg by nebulization 4 (four) times daily.         Marland Kitchen levofloxacin (LEVAQUIN) 500 MG tablet   Oral   Take 1 tablet (500 mg total) by mouth daily.   5 tablet   0   . loratadine (CLARITIN) 10 MG tablet   Oral   Take 10 mg by mouth daily.         Marland Kitchen LORazepam (ATIVAN) 0.5 MG tablet   Oral   Take 1 tablet (0.5 mg total) by mouth every 8 (eight) hours as  needed for anxiety.   10 tablet   0   . montelukast (SINGULAIR) 10 MG tablet   Oral   Take 10 mg by mouth every evening.         . nitroGLYCERIN (NITROSTAT) 0.4 MG SL tablet   Sublingual   Place 0.4 mg under the tongue every 5 (five) minutes as needed for chest pain.         . predniSONE (DELTASONE) 20 MG tablet   Oral   Take 3 tablets (60 mg total) by mouth daily.   15 tablet   0   . roflumilast (DALIRESP) 500 MCG TABS tablet   Oral   Take 500 mcg by mouth daily.         Marland Kitchen tiotropium (SPIRIVA) 18 MCG inhalation capsule   Inhalation   Place 18 mcg into inhaler and inhale daily.           Allergies Budesonide-formoterol fumarate  Family History  Problem Relation Age of Onset  . Cancer Mother   . Hypertension Mother   . Diabetes Mother   . Asthma Son     Social History Social History  Substance Use Topics  . Smoking status: Former Smoker -- 2.00 packs/day for 35 years    Types: Cigarettes  . Smokeless tobacco: Not on file  . Alcohol Use: Yes    Review of Systems Constitutional: No fever/chills Eyes: No visual changes. ENT: No sore throat. Cardiovascular: Denies chest pain. Respiratory: Severe SOB with wheezing and retractions Gastrointestinal: No abdominal pain.  No nausea, no vomiting.  No diarrhea.  No constipation. Genitourinary: Negative for dysuria. Musculoskeletal: Negative for back pain. Skin: Negative for rash. Neurological: Negative for headaches, focal weakness or numbness.  10-point ROS otherwise negative.  ____________________________________________   PHYSICAL EXAM:  VITAL SIGNS: ED Triage Vitals  Enc Vitals Group     BP 12/23/14 1637 131/93 mmHg     Pulse Rate 12/23/14 1637 128     Resp 12/23/14 1637 24     Temp 12/23/14 1637 97.9 F (36.6 C)     Temp src --      SpO2 12/23/14 1637 95 %     Weight 12/23/14 1637 167 lb (75.751 kg)     Height 12/23/14 1637 5\' 2"  (1.575 m)     Head Cir --      Peak Flow --  Pain  Score --      Pain Loc --      Pain Edu? --      Excl. in GC? --     Constitutional: Alert and oriented.  Severe respiratory distress Eyes: Conjunctivae are normal. PERRL. EOMI. Head: Atraumatic. Nose: No congestion/rhinnorhea. Mouth/Throat: Mucous membranes are dry.  Oropharynx non-erythematous. Neck: No stridor.   Cardiovascular: Tachycardia, regular rhythm. Grossly normal heart sounds.  Good peripheral circulation. Respiratory: Retractions and accessory muscle usage with decreased breath sounds and strong expiratory wheezing throughout lung fields with generalized crackles throughout. Gastrointestinal: Soft and nontender. No distention. No abdominal bruits. No CVA tenderness. Musculoskeletal: No lower extremity tenderness nor edema.  No joint effusions. Neurologic:  Normal speech and language. No gross focal neurologic deficits are appreciated.  Skin:  Skin is warm, dry and intact. No rash noted. Psychiatric: Mood and affect are anxious. Speech and behavior are normal.  ____________________________________________   LABS (all labs ordered are listed, but only abnormal results are displayed)  Labs Reviewed  COMPREHENSIVE METABOLIC PANEL - Abnormal; Notable for the following:    Glucose, Bld 137 (*)    All other components within normal limits  CBC WITH DIFFERENTIAL/PLATELET - Abnormal; Notable for the following:    MCV 100.9 (*)    All other components within normal limits  TROPONIN I  MAGNESIUM   ____________________________________________  EKG  ED ECG REPORT I, FORBACH, CORY, the attending physician, personally viewed and interpreted this ECG.   Date: 12/23/2014  EKG Time: 16:37  Rate: 128  Rhythm: sinus tachycardia  Axis: Normal  Intervals:Significant artifact is present due to motion , but just feels appear normal  ST&T Change: Non-specific ST segment / T-wave changes, but no evidence of acute ischemia. Significant artifact is  present  ____________________________________________  RADIOLOGY   Dg Chest Portable 1 View  12/23/2014   CLINICAL DATA:  Shortness of breath, history of prior tobacco use  EXAM: PORTABLE CHEST - 1 VIEW  COMPARISON:  12/10/2014  FINDINGS: The heart size and mediastinal contours are within normal limits. Both lungs are clear. The visualized skeletal structures are unremarkable.  IMPRESSION: No active disease.   Electronically Signed   By: Alcide Clever M.D.   On: 12/23/2014 16:57    ____________________________________________   PROCEDURES  Procedure(s) performed: None  Critical Care performed: Yes, see critical care note(s)   CRITICAL CARE Performed by: Loleta Rose   Total critical care time: 30 minutes  Critical care time was exclusive of separately billable procedures and treating other patients.  Critical care was necessary to treat or prevent imminent or life-threatening deterioration.  Critical care was time spent personally by me on the following activities: development of treatment plan with patient and/or surrogate as well as nursing, discussions with consultants, evaluation of patient's response to treatment, examination of patient, obtaining history from patient or surrogate, ordering and performing treatments and interventions, ordering and review of laboratory studies, ordering and review of radiographic studies, pulse oximetry and re-evaluation of patient's condition.  ____________________________________________   INITIAL IMPRESSION / ASSESSMENT AND PLAN / ED COURSE  Pertinent labs & imaging results that were available during my care of the patient were reviewed by me and considered in my medical decision making (see chart for details).  The patient is in severe distress upon arrival and is well-known to this emergency department for her COPD.  I started her on BiPAP immediately for her comfort and work of breathing and ran in-line albuterol.  We gave her  SoluMedrol 125 mg IV and I considered a small fluid bolus but she also has a history of CHF so I held for now.  I will obtain normal labs and a chest x-ray particular since she said she was not able to take her Levaquin recently and I want to rule out a pneumonia.   (Note that documentation was delayed due to multiple ED patients requiring immediate care.)   I reassessed the patient twice and she was improving significantly each time.  We are able to transition her off the BiPAP and onto nasal cannula 4 L which is her baseline and she was satting well.  She wanted to go home almost immediately after the completion of her treatments and the resolution of her severe distress but we observed her for several hours.  Given that she appears to be back at her baseline and that her lung sounds have improved significantly with only mild expiratory wheezing and some generalized crackles and overall reassuring workup otherwise, I think this is appropriate.  Her new prescription for albuterol nebulizer solution as well as a prescription for Ativan which she was not able to fill previously.  I do not believe she needs Levaquin at this time sided not give her a new prescription for that.  I encouraged her to follow up as soon as possible with her pulmonologist at [facility].  She understands and agrees with plan and very much wants to go home.  He was tolerating good by mouth intake  ____________________________________________  FINAL CLINICAL IMPRESSION(S) / ED DIAGNOSES  Final diagnoses:  Acute exacerbation of chronic obstructive pulmonary disease (COPD)      NEW MEDICATIONS STARTED DURING THIS VISIT:  Discharge Medication List as of 12/23/2014  7:20 PM    START taking these medications   Details  predniSONE (DELTASONE) 20 MG tablet Take 3 tablets (60 mg total) by mouth daily., Starting 12/23/2014, Until Discontinued, Print       provided new prescriptions for albuterol nebulizer solution and Ativan, which the  patient was not able to fill after the last discharge   Loleta Rose, MD 12/23/14 2024

## 2015-01-02 ENCOUNTER — Inpatient Hospital Stay
Admission: EM | Admit: 2015-01-02 | Discharge: 2015-01-03 | DRG: 189 | Disposition: A | Discharge status: Home / Self Care | Payer: Medicaid Other | Attending: Internal Medicine | Admitting: Internal Medicine

## 2015-01-02 ENCOUNTER — Other Ambulatory Visit: Payer: Self-pay

## 2015-01-02 ENCOUNTER — Encounter: Payer: Self-pay | Admitting: Emergency Medicine

## 2015-01-02 ENCOUNTER — Emergency Department: Payer: Medicaid Other

## 2015-01-02 DIAGNOSIS — K219 Gastro-esophageal reflux disease without esophagitis: Secondary | ICD-10-CM | POA: Diagnosis present

## 2015-01-02 DIAGNOSIS — J441 Chronic obstructive pulmonary disease with (acute) exacerbation: Secondary | ICD-10-CM | POA: Diagnosis present

## 2015-01-02 DIAGNOSIS — Z792 Long term (current) use of antibiotics: Secondary | ICD-10-CM

## 2015-01-02 DIAGNOSIS — J9621 Acute and chronic respiratory failure with hypoxia: Secondary | ICD-10-CM | POA: Diagnosis present

## 2015-01-02 DIAGNOSIS — Z9981 Dependence on supplemental oxygen: Secondary | ICD-10-CM | POA: Diagnosis not present

## 2015-01-02 DIAGNOSIS — Z888 Allergy status to other drugs, medicaments and biological substances status: Secondary | ICD-10-CM | POA: Diagnosis not present

## 2015-01-02 DIAGNOSIS — Z8249 Family history of ischemic heart disease and other diseases of the circulatory system: Secondary | ICD-10-CM | POA: Diagnosis not present

## 2015-01-02 DIAGNOSIS — I509 Heart failure, unspecified: Secondary | ICD-10-CM | POA: Diagnosis present

## 2015-01-02 DIAGNOSIS — I10 Essential (primary) hypertension: Secondary | ICD-10-CM | POA: Diagnosis present

## 2015-01-02 DIAGNOSIS — Z825 Family history of asthma and other chronic lower respiratory diseases: Secondary | ICD-10-CM

## 2015-01-02 DIAGNOSIS — F329 Major depressive disorder, single episode, unspecified: Secondary | ICD-10-CM | POA: Diagnosis present

## 2015-01-02 DIAGNOSIS — Z87891 Personal history of nicotine dependence: Secondary | ICD-10-CM

## 2015-01-02 DIAGNOSIS — J45909 Unspecified asthma, uncomplicated: Secondary | ICD-10-CM | POA: Diagnosis present

## 2015-01-02 DIAGNOSIS — Z833 Family history of diabetes mellitus: Secondary | ICD-10-CM | POA: Diagnosis not present

## 2015-01-02 DIAGNOSIS — Z809 Family history of malignant neoplasm, unspecified: Secondary | ICD-10-CM

## 2015-01-02 DIAGNOSIS — F419 Anxiety disorder, unspecified: Secondary | ICD-10-CM | POA: Diagnosis present

## 2015-01-02 LAB — CBC WITH DIFFERENTIAL/PLATELET
BASOS PCT: 1 %
Basophils Absolute: 0 10*3/uL (ref 0–0.1)
Eosinophils Absolute: 0.4 10*3/uL (ref 0–0.7)
Eosinophils Relative: 5 %
HCT: 40.4 % (ref 35.0–47.0)
HEMOGLOBIN: 13.4 g/dL (ref 12.0–16.0)
Lymphocytes Relative: 20 %
Lymphs Abs: 1.6 10*3/uL (ref 1.0–3.6)
MCH: 33.1 pg (ref 26.0–34.0)
MCHC: 33.1 g/dL (ref 32.0–36.0)
MCV: 99.9 fL (ref 80.0–100.0)
MONOS PCT: 13 %
Monocytes Absolute: 1 10*3/uL — ABNORMAL HIGH (ref 0.2–0.9)
NEUTROS PCT: 61 %
Neutro Abs: 4.6 10*3/uL (ref 1.4–6.5)
Platelets: 340 10*3/uL (ref 150–440)
RBC: 4.05 MIL/uL (ref 3.80–5.20)
RDW: 12.6 % (ref 11.5–14.5)
WBC: 7.7 10*3/uL (ref 3.6–11.0)

## 2015-01-02 LAB — COMPREHENSIVE METABOLIC PANEL
ALBUMIN: 4.1 g/dL (ref 3.5–5.0)
ALK PHOS: 58 U/L (ref 38–126)
ALT: 33 U/L (ref 14–54)
ANION GAP: 6 (ref 5–15)
AST: 29 U/L (ref 15–41)
BUN: 20 mg/dL (ref 6–20)
CO2: 33 mmol/L — AB (ref 22–32)
Calcium: 9 mg/dL (ref 8.9–10.3)
Chloride: 101 mmol/L (ref 101–111)
Creatinine, Ser: 0.89 mg/dL (ref 0.44–1.00)
GFR calc Af Amer: >60 mL/min (ref >60)
GFR calc non Af Amer: >60 mL/min (ref >60)
GLUCOSE: 116 mg/dL — AB (ref 65–99)
POTASSIUM: 4.1 mmol/L (ref 3.5–5.1)
SODIUM: 140 mmol/L (ref 135–145)
Total Bilirubin: 0.5 mg/dL (ref 0.3–1.2)
Total Protein: 6.6 g/dL (ref 6.5–8.1)

## 2015-01-02 LAB — LACTIC ACID, PLASMA: LACTIC ACID, VENOUS: 1.4 mmol/L (ref 0.5–2.0)

## 2015-01-02 LAB — TROPONIN I: Troponin I: <0.03 ng/mL (ref <0.031)

## 2015-01-02 LAB — BRAIN NATRIURETIC PEPTIDE: B Natriuretic Peptide: 28 pg/mL (ref 0.0–100.0)

## 2015-01-02 MED ORDER — ENALAPRIL MALEATE 10 MG PO TABS
20.0000 mg | ORAL_TABLET | Freq: Two times a day (BID) | ORAL | Status: DC
  Administered 2015-01-03: 11:00:00 20 mg via ORAL
  Filled 2015-01-02: qty 2

## 2015-01-02 MED ORDER — ONDANSETRON HCL 4 MG/2ML IJ SOLN: 4.0000 mg | Freq: Four times a day (QID) | INTRAMUSCULAR | Status: DC | PRN

## 2015-01-02 MED ORDER — MONTELUKAST SODIUM 10 MG PO TABS: 10.0000 mg | ORAL_TABLET | Freq: Every evening | ORAL | Status: DC

## 2015-01-02 MED ORDER — PANTOPRAZOLE SODIUM 40 MG PO TBEC
40.0000 mg | DELAYED_RELEASE_TABLET | Freq: Every day | ORAL | Status: DC
Start: 2015-01-03 — End: 2015-01-03
  Administered 2015-01-03: 40 mg via ORAL
  Filled 2015-01-02: qty 1

## 2015-01-02 MED ORDER — IPRATROPIUM BROMIDE 0.02 % IN SOLN: 0.5000 mg | Freq: Four times a day (QID) | RESPIRATORY_TRACT | Status: DC | PRN

## 2015-01-02 MED ORDER — LORATADINE 10 MG PO TABS
10.0000 mg | ORAL_TABLET | Freq: Every day | ORAL | Status: DC
  Administered 2015-01-03: 11:00:00 10 mg via ORAL
  Filled 2015-01-02: qty 1

## 2015-01-02 MED ORDER — ENOXAPARIN SODIUM 40 MG/0.4ML ~~LOC~~ SOLN: 40.0000 mg | SUBCUTANEOUS | Status: DC

## 2015-01-02 MED ORDER — ALBUTEROL SULFATE (2.5 MG/3ML) 0.083% IN NEBU: 2.5000 mg | INHALATION_SOLUTION | RESPIRATORY_TRACT | Status: DC | PRN

## 2015-01-02 MED ORDER — LORAZEPAM 0.5 MG PO TABS
0.5000 mg | ORAL_TABLET | Freq: Three times a day (TID) | ORAL | Status: DC | PRN
  Administered 2015-01-03: 0.5 mg via ORAL
  Filled 2015-01-02: qty 1

## 2015-01-02 MED ORDER — METHYLPREDNISOLONE SODIUM SUCC 125 MG IJ SOLR
60.0000 mg | Freq: Four times a day (QID) | INTRAMUSCULAR | Status: DC
  Administered 2015-01-03 (×3): 60 mg via INTRAVENOUS
  Filled 2015-01-02 (×3): qty 2

## 2015-01-02 MED ORDER — IPRATROPIUM-ALBUTEROL 0.5-2.5 (3) MG/3ML IN SOLN
3.0000 mL | Freq: Once | RESPIRATORY_TRACT | Status: AC
  Administered 2015-01-02: 3 mL via RESPIRATORY_TRACT
  Filled 2015-01-02: qty 3

## 2015-01-02 MED ORDER — ROFLUMILAST 500 MCG PO TABS
500.0000 ug | ORAL_TABLET | Freq: Every day | ORAL | Status: DC
  Administered 2015-01-03: 11:00:00 500 ug via ORAL
  Filled 2015-01-02: qty 1

## 2015-01-02 MED ORDER — NITROGLYCERIN 0.4 MG SL SUBL: 0.4000 mg | SUBLINGUAL_TABLET | SUBLINGUAL | Status: DC | PRN

## 2015-01-02 MED ORDER — HYDROCHLOROTHIAZIDE 25 MG PO TABS
25.0000 mg | ORAL_TABLET | Freq: Every day | ORAL | Status: DC
  Administered 2015-01-03: 25 mg via ORAL
  Filled 2015-01-02: qty 1

## 2015-01-02 MED ORDER — BECLOMETHASONE DIPROPIONATE 80 MCG/ACT IN AERS: 1.0000 | INHALATION_SPRAY | Freq: Two times a day (BID) | RESPIRATORY_TRACT | Status: DC

## 2015-01-02 MED ORDER — CITALOPRAM HYDROBROMIDE 20 MG PO TABS
10.0000 mg | ORAL_TABLET | Freq: Every day | ORAL | Status: DC
  Filled 2015-01-02: qty 1

## 2015-01-02 MED ORDER — FAMOTIDINE 20 MG PO TABS
20.0000 mg | ORAL_TABLET | Freq: Two times a day (BID) | ORAL | Status: DC
Start: 2015-01-02 — End: 2015-01-03
  Administered 2015-01-03: 20 mg via ORAL
  Filled 2015-01-02: qty 1

## 2015-01-02 MED ORDER — ACETAMINOPHEN 325 MG PO TABS: 650.0000 mg | ORAL_TABLET | Freq: Four times a day (QID) | ORAL | Status: DC | PRN

## 2015-01-02 MED ORDER — FLUTICASONE PROPIONATE 50 MCG/ACT NA SUSP
1.0000 | Freq: Every day | NASAL | Status: DC
  Filled 2015-01-02: qty 16

## 2015-01-02 MED ORDER — ONDANSETRON HCL 4 MG PO TABS: 4.0000 mg | ORAL_TABLET | Freq: Four times a day (QID) | ORAL | Status: DC | PRN

## 2015-01-02 MED ORDER — LEVOFLOXACIN IN D5W 750 MG/150ML IV SOLN
750.0000 mg | Freq: Once | INTRAVENOUS | Status: AC
  Administered 2015-01-02: 750 mg via INTRAVENOUS
  Filled 2015-01-02: qty 150

## 2015-01-02 MED ORDER — FLUTICASONE PROPIONATE HFA 44 MCG/ACT IN AERO
2.0000 | INHALATION_SPRAY | Freq: Two times a day (BID) | RESPIRATORY_TRACT | Status: DC
  Administered 2015-01-03: 2 via RESPIRATORY_TRACT
  Filled 2015-01-02: qty 10.6

## 2015-01-02 MED ORDER — TIOTROPIUM BROMIDE MONOHYDRATE 18 MCG IN CAPS
18.0000 ug | ORAL_CAPSULE | Freq: Every day | RESPIRATORY_TRACT | Status: DC
  Administered 2015-01-03: 18 ug via RESPIRATORY_TRACT
  Filled 2015-01-02: qty 5

## 2015-01-02 MED ORDER — CARVEDILOL 6.25 MG PO TABS
12.5000 mg | ORAL_TABLET | Freq: Two times a day (BID) | ORAL | Status: DC
  Administered 2015-01-03: 12.5 mg via ORAL
  Filled 2015-01-02: qty 2

## 2015-01-02 MED ORDER — DOCUSATE SODIUM 100 MG PO CAPS
100.0000 mg | ORAL_CAPSULE | Freq: Two times a day (BID) | ORAL | Status: DC
  Administered 2015-01-03: 100 mg via ORAL
  Filled 2015-01-02: qty 1

## 2015-01-02 MED ORDER — ACETAMINOPHEN 650 MG RE SUPP: 650.0000 mg | Freq: Four times a day (QID) | RECTAL | Status: DC | PRN

## 2015-01-02 MED ORDER — AMLODIPINE BESYLATE 5 MG PO TABS
5.0000 mg | ORAL_TABLET | Freq: Every day | ORAL | Status: DC
Start: 2015-01-03 — End: 2015-01-03
  Administered 2015-01-03: 5 mg via ORAL
  Filled 2015-01-02: qty 1

## 2015-01-02 NOTE — H&P (Signed)
[facility] - [place] [facility]   PATIENT NAME: Julie Foley    MR#:  [id number]  DATE OF BIRTH:  July 13, 1955  DATE OF ADMISSION:  01/02/2015  PRIMARY CARE PHYSICIAN: Pcp Not In System   REQUESTING/REFERRING PHYSICIAN: dr.Gayle  CHIEF COMPLAINT:  sob  HISTORY OF PRESENT ILLNESS:  Julie Foley  is a 59 y.o. female with a known history of chronic COPD lives on 4 L of home oxygen, congestive heart failure, hypertension, GERD and anxiety is presenting to the ED with a chief complaint of shortness of breath for the past several days. Lately her shortness of breath is worse and patient couldn't breathe. Lately she has been having nasal congestion and cough which has aggravated her clinical situation. Patient has called EMS as she couldn't breathe, patient has received Solu-Medrol 125 mg IV by EMS and he is brought into the ED. She was placed on BiPAP as she was in severe respiratory distress by the ED physician. During my examination patient is slightly feeling better. Denies any history of smoking. Reports her chest hurts while coughing  PAST MEDICAL HISTORY:   Past Medical History  Diagnosis Date  . COPD (chronic obstructive pulmonary disease) (HCC)   . CHF (congestive heart failure) (HCC)   . HTN (hypertension)   . GERD (gastroesophageal reflux disease)   . Depression   . Anxiety     PAST SURGICAL HISTOIRY:   Past Surgical History  Procedure Laterality Date  . Tubal ligation Bilateral     SOCIAL HISTORY:   Social History  Substance Use Topics  . Smoking status: Former Smoker -- 2.00 packs/day for 35 years    Types: Cigarettes  . Smokeless tobacco: Not on file  . Alcohol Use: Yes    FAMILY HISTORY:   Family History  Problem Relation Age of Onset  . Cancer Mother   . Hypertension Mother   . Diabetes Mother   . Asthma Son     DRUG ALLERGIES:   Allergies  Allergen Reactions  . Budesonide-Formoterol Fumarate Swelling    REVIEW OF  SYSTEMS:  CONSTITUTIONAL: No fever, reporting fatigue and weakness.  EYES: No blurred or double vision.  EARS, NOSE, AND THROAT: No tinnitus or ear pain.  RESPIRATORY: Reports dry cough, shortness of breath, wheezing . Denies hemoptysis.  CARDIOVASCULAR: chest pain while coughing, orthopnea, edema.  GASTROINTESTINAL: No nausea, vomiting, diarrhea or abdominal pain.  GENITOURINARY: No dysuria, hematuria.  ENDOCRINE: No polyuria, nocturia,  HEMATOLOGY: No anemia, easy bruising or bleeding SKIN: No rash or lesion. MUSCULOSKELETAL: No joint pain or arthritis.   NEUROLOGIC: No tingling, numbness, weakness.  PSYCHIATRY: No anxiety or depression.   MEDICATIONS AT HOME:   Prior to Admission medications   Medication Sig Start Date End Date Taking? Authorizing Provider  albuterol (PROVENTIL HFA;VENTOLIN HFA) 108 (90 BASE) MCG/ACT inhaler Inhale 2 puffs into the lungs every 4 (four) hours as needed for wheezing or shortness of breath.   Yes Historical Provider, MD  albuterol (PROVENTIL) (2.5 MG/3ML) 0.083% nebulizer solution Take 3 mLs (2.5 mg total) by nebulization every 4 (four) hours as needed for wheezing or shortness of breath. 12/23/14  Yes Loleta Rose, MD  amLODipine (NORVASC) 5 MG tablet Take 5 mg by mouth daily.   Yes Historical Provider, MD  beclomethasone (QVAR) 80 MCG/ACT inhaler Inhale 1 puff into the lungs 2 (two) times daily.   Yes Historical Provider, MD  carvedilol (COREG) 12.5 MG tablet Take 1 tablet (12.5 mg total) by mouth 2 (  two) times daily with a meal. 08/13/14  Yes Sital Mody, MD  citalopram (CELEXA) 10 MG tablet Take 10 mg by mouth daily.   Yes Historical Provider, MD  enalapril (VASOTEC) 20 MG tablet Take 20 mg by mouth 2 (two) times daily.    Yes Historical Provider, MD  famotidine (PEPCID) 20 MG tablet Take 20 mg by mouth 2 (two) times daily.   Yes Historical Provider, MD  fluticasone (FLONASE) 50 MCG/ACT nasal spray Place 1 spray into both nostrils daily.   Yes Historical  Provider, MD  Fluticasone-Salmeterol (ADVAIR) 500-50 MCG/DOSE AEPB Inhale 1 puff into the lungs 2 (two) times daily.   Yes Historical Provider, MD  hydrochlorothiazide (HYDRODIURIL) 25 MG tablet Take 25 mg by mouth daily.   Yes Historical Provider, MD  ipratropium (ATROVENT) 0.02 % nebulizer solution Take 0.5 mg by nebulization 4 (four) times daily as needed for wheezing or shortness of breath.    Yes Historical Provider, MD  loratadine (CLARITIN) 10 MG tablet Take 10 mg by mouth daily.   Yes Historical Provider, MD  LORazepam (ATIVAN) 0.5 MG tablet Take 1 tablet (0.5 mg total) by mouth every 8 (eight) hours as needed for anxiety. 12/23/14  Yes Loleta Rose, MD  montelukast (SINGULAIR) 10 MG tablet Take 10 mg by mouth every evening.   Yes Historical Provider, MD  nitroGLYCERIN (NITROSTAT) 0.4 MG SL tablet Place 0.4 mg under the tongue every 5 (five) minutes as needed for chest pain.   Yes Historical Provider, MD  roflumilast (DALIRESP) 500 MCG TABS tablet Take 500 mcg by mouth daily.   Yes Historical Provider, MD  tiotropium (SPIRIVA) 18 MCG inhalation capsule Place 18 mcg into inhaler and inhale daily.   Yes Historical Provider, MD  levofloxacin (LEVAQUIN) 500 MG tablet Take 1 tablet (500 mg total) by mouth daily. Patient not taking: Reported on 01/02/2015 12/11/14   Enid Baas, MD  predniSONE (DELTASONE) 20 MG tablet Take 3 tablets (60 mg total) by mouth daily. Patient not taking: Reported on 01/02/2015 12/23/14   Loleta Rose, MD      VITAL SIGNS:  Blood pressure 136/70, pulse 122, temperature 98.3 F (36.8 C), temperature source Oral, resp. rate 32, height 5\' 2"  (1.575 m), weight 78.926 kg (174 lb), SpO2 99 %.  PHYSICAL EXAMINATION:  GENERAL:  59 y.o.-year-old patient lying in the bed with no acute distress.  EYES: Pupils equal, round, reactive to light and accommodation. No scleral icterus. Extraocular muscles intact.  HEENT: Head atraumatic, normocephalic. Oropharynx and nasopharynx  clear. On BiPAP mask NECK:  Supple, no jugular venous distention. No thyroid enlargement, no tenderness.  LUNGS: Coarse bronchial breath sounds bilaterally, moderate air entry, diffuse  wheezing, no rales,rhonchi or crepitation. No use of accessory muscles of respiration.  CARDIOVASCULAR: S1, S2 normal. Tachycardic No murmurs, rubs, or gallops.  ABDOMEN: Soft, nontender, nondistended. Bowel sounds present. No organomegaly or mass.  EXTREMITIES: No pedal edema, cyanosis, or clubbing.  NEUROLOGIC: Cranial nerves II through XII are intact. Muscle strength 5/5 in all extremities. Sensation intact. Gait not checked.  PSYCHIATRIC: The patient is alert and oriented x 3.  SKIN: No obvious rash, lesion, or ulcer.   LABORATORY PANEL:   CBC  Recent Labs Lab 01/02/15 1922  WBC 7.7  HGB 13.4  HCT 40.4  PLT 340   ------------------------------------------------------------------------------------------------------------------  Chemistries   Recent Labs Lab 01/02/15 1922  NA 140  K 4.1  CL 101  CO2 33*  GLUCOSE 116*  BUN 20  CREATININE 0.89  CALCIUM  9.0  AST 29  ALT 33  ALKPHOS 58  BILITOT 0.5   ------------------------------------------------------------------------------------------------------------------  Cardiac Enzymes  Recent Labs Lab 01/02/15 1922  TROPONINI <0.03   ------------------------------------------------------------------------------------------------------------------  RADIOLOGY:  Dg Chest Portable 1 View  01/02/2015   CLINICAL DATA:  Shortness of breath.  EXAM: PORTABLE CHEST 1 VIEW  COMPARISON:  December 23, 2014.  FINDINGS: The heart size and mediastinal contours are within normal limits. Both lungs are clear. No pneumothorax or pleural effusion is noted. The visualized skeletal structures are unremarkable.  IMPRESSION: No acute cardiopulmonary abnormality seen.   Electronically Signed   By: Lupita Raider, M.D.   On: 01/02/2015 19:42    EKG:    Orders placed or performed during the hospital encounter of 01/02/15  . ED EKG  . ED EKG    IMPRESSION AND PLAN:   Julie Foley  is a 59 y.o. female with a known history of chronic COPD lives on 4 L of home oxygen, congestive heart failure, hypertension, GERD and anxiety is presenting to the ED with a chief complaint of shortness of breath for the past several days. Lately her shortness of breath is worse and patient couldn't breathe. Lately she has been having nasal congestion and cough which has aggravated her clinical situation. Patient has called EMS as she couldn't breathe, patient has received Solu-Medrol 125 mg IV by EMS and he is brought into the ED.  1. Acute on chronic hypoxic respiratory failure secondary to COPD exacerbation  Currently on BiPAP Solu-Medrol 125 mg IV was given by the EMS Continues on Imdur 60 mg IV every 6 hours Provide nebulizer treatments Kevin levofloxacin is ordered per pharmacy consult Critical care consult is placed Continue home meds Singulair, Claritin, Daliresp and Flonase   2. Sinus tachycardia Probably secondary to the laser treatments Will continue close monitoring of the patient.   3, Current history of congestive heart failure Currently patient is not fluid overloaded  3 hypertension Continue home medication amlodipine, enalapril    4. GERD Provide PPI   All the records are reviewed and case discussed with ED provider. Management plans discussed with the patient, family and they are in agreement.  CODE STATUS: Full code, daughter is the healthcare power of attorney  TOTAL CRITICAL CARE TIME TAKING CARE OF THIS PATIENT: 50 minutes.    Ramonita Lab M.D on 01/02/2015 at 9:27 PM  Between 7am to 6pm - Pager - [phone]  After 6pm go to [email or web address] - password [email or web address]  [facility]  Office  [phone]  CC: Primary care physician; Pcp Not In System

## 2015-01-02 NOTE — ED Provider Notes (Signed)
[facility] Emergency Department Provider Note  ____________________________________________  Time seen: Approximately 7:23 PM  I have reviewed the triage vital signs and the nursing notes.   HISTORY  Chief Complaint Shortness of Breath  Caveat-history of present illness and review of systems is limited secondary to the patient's severe respiratory distress.  HPI MARELLY WEHRMAN is a 59 y.o. female with history of COPD with 4 L home oxygen requirement, CHF, hypertension, GERD who presents for evaluation of gradual onset shortness of breath for the past several days, worsening, intermittent. Patient reports that she has had runny nose, nasal congestion and cough for several days. She's had some shortness of breath but attempted to use her inhalers at home however her SOB became severe this evening. She called EMS and received Solu-Medrol as well as 1 DuoNeb for wheezing and respiratory distress. She is currently having some chest tightness. She feels slightly improved after duoneb treatment. Currently her symptoms are severe. No modifying factors.   Past Medical History  Diagnosis Date  . COPD (chronic obstructive pulmonary disease) (HCC)   . CHF (congestive heart failure) (HCC)   . HTN (hypertension)   . GERD (gastroesophageal reflux disease)   . Depression   . Anxiety     Patient Active Problem List   Diagnosis Date Noted  . Acute respiratory failure (HCC) 12/10/2014  . Acute respiratory failure with hypoxemia (HCC) 10/07/2014  . Anxiety 10/07/2014  . HTN (hypertension) 09/23/2014  . GERD (gastroesophageal reflux disease) 09/23/2014  . CHF (congestive heart failure) (HCC) 09/23/2014  . COPD exacerbation (HCC) 08/11/2014    Past Surgical History  Procedure Laterality Date  . Tubal ligation Bilateral     Current Outpatient Rx  Name  Route  Sig  Dispense  Refill  . albuterol (PROVENTIL) (2.5 MG/3ML) 0.083% nebulizer solution   Nebulization   Take 3  mLs (2.5 mg total) by nebulization every 4 (four) hours as needed for wheezing or shortness of breath.   75 mL   2   . amLODipine (NORVASC) 5 MG tablet   Oral   Take 5 mg by mouth daily.         . beclomethasone (QVAR) 80 MCG/ACT inhaler   Inhalation   Inhale 1 puff into the lungs 2 (two) times daily.         . carvedilol (COREG) 12.5 MG tablet   Oral   Take 1 tablet (12.5 mg total) by mouth 2 (two) times daily with a meal.   30 tablet   0   . citalopram (CELEXA) 10 MG tablet   Oral   Take 10 mg by mouth daily.         . enalapril (VASOTEC) 20 MG tablet   Oral   Take 20 mg by mouth 2 (two) times daily.          . famotidine (PEPCID) 20 MG tablet   Oral   Take 20 mg by mouth 2 (two) times daily.         . fluticasone (FLONASE) 50 MCG/ACT nasal spray   Each Nare   Place 1 spray into both nostrils daily.         . Fluticasone-Salmeterol (ADVAIR) 500-50 MCG/DOSE AEPB   Inhalation   Inhale 1 puff into the lungs 2 (two) times daily.         . hydrochlorothiazide (HYDRODIURIL) 25 MG tablet   Oral   Take 25 mg by mouth daily.         Marland Kitchen  ipratropium (ATROVENT) 0.02 % nebulizer solution   Nebulization   Take 0.5 mg by nebulization 4 (four) times daily.         Marland Kitchen levofloxacin (LEVAQUIN) 500 MG tablet   Oral   Take 1 tablet (500 mg total) by mouth daily.   5 tablet   0   . loratadine (CLARITIN) 10 MG tablet   Oral   Take 10 mg by mouth daily.         Marland Kitchen LORazepam (ATIVAN) 0.5 MG tablet   Oral   Take 1 tablet (0.5 mg total) by mouth every 8 (eight) hours as needed for anxiety.   10 tablet   0   . montelukast (SINGULAIR) 10 MG tablet   Oral   Take 10 mg by mouth every evening.         . nitroGLYCERIN (NITROSTAT) 0.4 MG SL tablet   Sublingual   Place 0.4 mg under the tongue every 5 (five) minutes as needed for chest pain.         . predniSONE (DELTASONE) 20 MG tablet   Oral   Take 3 tablets (60 mg total) by mouth daily.   15 tablet    0   . roflumilast (DALIRESP) 500 MCG TABS tablet   Oral   Take 500 mcg by mouth daily.         Marland Kitchen tiotropium (SPIRIVA) 18 MCG inhalation capsule   Inhalation   Place 18 mcg into inhaler and inhale daily.           Allergies Budesonide-formoterol fumarate  Family History  Problem Relation Age of Onset  . Cancer Mother   . Hypertension Mother   . Diabetes Mother   . Asthma Son     Social History Social History  Substance Use Topics  . Smoking status: Former Smoker -- 2.00 packs/day for 35 years    Types: Cigarettes  . Smokeless tobacco: None  . Alcohol Use: Yes    Review of Systems Constitutional: No fever/chills Eyes: No visual changes. ENT: No sore throat. Cardiovascular: +chest pain. Respiratory: +shortness of breath. Gastrointestinal: No abdominal pain.  No nausea, no vomiting.  No diarrhea.  No constipation. Genitourinary: Negative for dysuria. Musculoskeletal: Negative for back pain. Skin: Negative for rash. Neurological: Negative for headaches, focal weakness or numbness.  10-point ROS otherwise negative.  ____________________________________________   PHYSICAL EXAM:  VITAL SIGNS: ED Triage Vitals  Enc Vitals Group     BP 01/02/15 1915 136/70 mmHg     Pulse Rate 01/02/15 1915 122     Resp 01/02/15 1915 32     Temp 01/02/15 1915 98.3 F (36.8 C)     Temp Source 01/02/15 1915 Oral     SpO2 01/02/15 1915 100 %     Weight 01/02/15 1915 174 lb (78.926 kg)     Height 01/02/15 1915 5\' 2"  (1.575 m)     Head Cir --      Peak Flow --      Pain Score --      Pain Loc --      Pain Edu? --      Excl. in GC? --     Constitutional: Alert and oriented. In severe respiratory distress. Speaking in short sentences. Eyes: Conjunctivae are normal. PERRL. EOMI. Head: Atraumatic. Nose: No congestion/rhinnorhea. Mouth/Throat: Mucous membranes are moist.  Oropharynx non-erythematous. Neck: No stridor.  Cardiovascular: tachycardic rate, regular rhythm.  Grossly normal heart sounds.  Good peripheral circulation. Respiratory: Tachypnea with increased work of  breathing, diffuse expiratory wheeze. Gastrointestinal: Soft and nontender. No distention. No CVA tenderness. Genitourinary: Deferred Musculoskeletal: No lower extremity tenderness nor edema.  No joint effusions. Neurologic:  Normal speech and language. No gross focal neurologic deficits are appreciated.  Skin:  Skin is warm, dry and intact. No rash noted. Psychiatric: Mood and affect are normal. Speech and behavior are normal.  ____________________________________________   LABS (all labs ordered are listed, but only abnormal results are displayed)  Labs Reviewed  CBC WITH DIFFERENTIAL/PLATELET - Abnormal; Notable for the following:    Monocytes Absolute 1.0 (*)    All other components within normal limits  COMPREHENSIVE METABOLIC PANEL - Abnormal; Notable for the following:    CO2 33 (*)    Glucose, Bld 116 (*)    All other components within normal limits  CULTURE, BLOOD (ROUTINE X 2)  CULTURE, BLOOD (ROUTINE X 2)  TROPONIN I  BRAIN NATRIURETIC PEPTIDE  LACTIC ACID, PLASMA  LACTIC ACID, PLASMA   ____________________________________________  EKG  ED ECG REPORT I, Gayla Doss, the attending physician, personally viewed and interpreted this ECG.   Date: 01/02/2015  EKG Time: 19:19  Rate: 123  Rhythm: sinus tachycardia  Axis: normal  Intervals:none  ST&T Change: No acute ST elevation  ____________________________________________  RADIOLOGY  CXR IMPRESSION: No acute cardiopulmonary abnormality seen.   ____________________________________________   PROCEDURES  Procedure(s) performed: None  Critical Care performed: Yes, see critical care note(s). Total critical care time spent 30 minutes.  ____________________________________________   INITIAL IMPRESSION / ASSESSMENT AND PLAN / ED COURSE  Pertinent labs & imaging results that were available during  my care of the patient were reviewed by me and considered in my medical decision making (see chart for details).  JAHAIRA EARNHART is a 59 y.o. female with history of COPD with 4 L home oxygen requirement, CHF, hypertension, GERD who presents for evaluation of gradual onset shortness of breath for the past several days worse tonight. On arrival to the emergency department she is in severe respiratory distress, speaking in very short sentences, tachypnea with increased work of breathing and diffuse expiratory wheeze but no hypoxia. BiPAP initiated with immediate improvement in her symptoms. She is also tachycardic with heart rate in the 120s, EKG shows sinus tachycardia. Suspect COPD exacerbation, likely brought on by URI. We will give Levaquin, obtain labs, chest x-ray, continue BiPAP. We'll continue DuoNeb treatments once her heart rate improves. Anticipate admission.  ----------------------------------------- 8:24 PM on 01/02/2015 -----------------------------------------  Patient with improved air movement and improvement in her respiratory distress. She currently appears much more comfortable, is talking complete sentences on BiPAP. Labs reviewed. Normal CBC, CMP, negative troponin, BNP is not elevated. Reassuring lactic acid 1.4. We'll give one DuoNeb. Chest x-ray clear. Case discussed with Dr. Amado Coe, hospitalist, at this time for admission. ____________________________________________   FINAL CLINICAL IMPRESSION(S) / ED DIAGNOSES  Final diagnoses:  COPD with exacerbation (HCC)      Gayla Doss, MD 01/02/15 2025

## 2015-01-02 NOTE — ED Notes (Signed)
Pt from home via ems states she is SOB, has COPD here frequently for same, 20g IV left AC placed, deuneb, Solumendrol , pts HR was in the 200 currently now 132.

## 2015-01-03 LAB — CBC
HEMATOCRIT: 36 % (ref 35.0–47.0)
Hemoglobin: 12.1 g/dL (ref 12.0–16.0)
MCH: 33.7 pg (ref 26.0–34.0)
MCHC: 33.7 g/dL (ref 32.0–36.0)
MCV: 99.9 fL (ref 80.0–100.0)
PLATELETS: 311 10*3/uL (ref 150–440)
RBC: 3.6 MIL/uL — AB (ref 3.80–5.20)
RDW: 12.8 % (ref 11.5–14.5)
WBC: 6.1 10*3/uL (ref 3.6–11.0)

## 2015-01-03 LAB — BLOOD GAS, ARTERIAL
ALLENS TEST (PASS/FAIL): POSITIVE — AB
Acid-Base Excess: 7.1 mmol/L — ABNORMAL HIGH (ref 0.0–3.0)
Bicarbonate: 32 mEq/L — ABNORMAL HIGH (ref 21.0–28.0)
FIO2: 0.36
O2 SAT: 98.1 %
PCO2 ART: 45 mmHg (ref 32.0–48.0)
PH ART: 7.46 — AB (ref 7.350–7.450)
Patient temperature: 37
pO2, Arterial: 101 mmHg (ref 83.0–108.0)

## 2015-01-03 LAB — BASIC METABOLIC PANEL
Anion gap: 6 (ref 5–15)
BUN: 16 mg/dL (ref 6–20)
CHLORIDE: 103 mmol/L (ref 101–111)
CO2: 31 mmol/L (ref 22–32)
Calcium: 8.8 mg/dL — ABNORMAL LOW (ref 8.9–10.3)
Creatinine, Ser: 0.7 mg/dL (ref 0.44–1.00)
GFR calc non Af Amer: >60 mL/min (ref >60)
Glucose, Bld: 165 mg/dL — ABNORMAL HIGH (ref 65–99)
POTASSIUM: 4.2 mmol/L (ref 3.5–5.1)
SODIUM: 140 mmol/L (ref 135–145)

## 2015-01-03 MED ORDER — LORAZEPAM 0.5 MG PO TABS: 0.5000 mg | ORAL_TABLET | Freq: Three times a day (TID) | ORAL | Status: DC | PRN

## 2015-01-03 MED ORDER — PREDNISONE 10 MG PO TABS: 5.0000 mg | ORAL_TABLET | Freq: Every day | ORAL | Status: DC

## 2015-01-03 MED ORDER — LEVOFLOXACIN IN D5W 750 MG/150ML IV SOLN
750.0000 mg | INTRAVENOUS | Status: DC
  Filled 2015-01-03: qty 150

## 2015-01-03 NOTE — Progress Notes (Signed)
ANTIBIOTIC CONSULT NOTE - INITIAL  Pharmacy Consult for Levaquin dosing Indication: COPD exacerbation  Allergies  Allergen Reactions  . Budesonide-Formoterol Fumarate Swelling    Patient Measurements: Height:  (157.5 cm) Weight: 174 lb (78.926 kg) IBW/kg (Calculated) : 50.1 Adjusted Body Weight: n/a  Vital Signs: Temp: 98.3 F (36.8 C) (10/04 1915) Temp Source: Oral (10/04 1915) BP: 119/88 mmHg (10/04 2230) Pulse Rate: 102 (10/04 2230) Intake/Output from previous day:   Intake/Output from this shift:    Labs:  Recent Labs  01/02/15 1922  WBC 7.7  HGB 13.4  PLT 340  CREATININE 0.89   Estimated Creatinine Clearance: 68.6 mL/min (by C-G formula based on Cr of 0.89). No results for input(s): VANCOTROUGH, VANCOPEAK, VANCORANDOM, GENTTROUGH, GENTPEAK, GENTRANDOM, TOBRATROUGH, TOBRAPEAK, TOBRARND, AMIKACINPEAK, AMIKACINTROU, AMIKACIN in the last 72 hours.   Microbiology: Recent Results (from the past 720 hour(s))  MRSA PCR Screening     Status: None   Collection Time: 12/10/14  9:30 PM  Result Value Ref Range Status   MRSA by PCR NEGATIVE NEGATIVE Final    Comment:        The GeneXpert MRSA Assay (FDA approved for NASAL specimens only), is one component of a comprehensive MRSA colonization surveillance program. It is not intended to diagnose MRSA infection nor to guide or monitor treatment for MRSA infections.     Medical History: Past Medical History  Diagnosis Date  . COPD (chronic obstructive pulmonary disease) (HCC)   . CHF (congestive heart failure) (HCC)   . HTN (hypertension)   . GERD (gastroesophageal reflux disease)   . Depression   . Anxiety     Medications:   Assessment: Blood cx pending CXR: no acute disease  Goal of Therapy:  Resolution of infection   Plan:  Levaquin 750 mg IV q 24 hours ordered as continuation of original ED dosing.  Zander Ingham S 01/03/2015,2:10 AM

## 2015-01-03 NOTE — Progress Notes (Signed)
MD order previously received in [facility] to discharge pt home today; S. Wineman RN previously reviewed AVS with pt; ABG performed by Respiratory Therapy on 4L [place] in order to see if pt qualifies for a BIPAP at night through [facility], results indicate that the pt is not a candidate; pt ready for discharge; [employer] called the floor to advise they were at the visitor's entrance for pt discharge; pt discharged via wheelchair by volunteers to the visitor's entrance

## 2015-01-03 NOTE — Consult Note (Signed)
Pulmonary Critical Care  Initial Consult Note   Julie Foley RUE:[id number] DOB: 1955/10/13 DOA: 01/02/2015  Referring physician: Adrian Saran, MD PCP: Pcp Not In System   Chief Complaint: Shortness of breath  HPI: Julie Foley is a 59 y.o. female with prior history of COPD/Asthma Gold Stage 4 and multiple exacerbations being followed by [facility] presented to the hospital with increased shortness of breath. She is on chronic oxygen at home and is on 4lpm flow. Patient does have a prior history of smoking but now she states that she does not use tobacco. She has had increased wheeze and cough on presentation which is now improved. Patient states that she is ready to go home. She has been ambulating with minimal shortness of breath noted at this time.   Review of Systems:  Constitutional:  No weight loss, night sweats, Fevers, chills, +fatigue.  HEENT:  No headache  Cardio-vascular:  No chest pain, Orthopnea, PND, swelling in lower extremities  GI:  No heartburn, indigestion, abdominal pain, nausea, vomiting, diarrhea  Resp:  +shortness of breath with exertion. +cough, No coughing up of blood Skin:  no rash or lesions.  Musculoskeletal:  No joint pain or swelling.   Remainder ROS performed and is unremarkable other than noted in HPI  Past Medical History  Diagnosis Date  . COPD (chronic obstructive pulmonary disease) (HCC)   . CHF (congestive heart failure) (HCC)   . HTN (hypertension)   . GERD (gastroesophageal reflux disease)   . Depression   . Anxiety    Past Surgical History  Procedure Laterality Date  . Tubal ligation Bilateral    Social History:  reports that she has quit smoking. Her smoking use included Cigarettes. She has a 70 pack-year smoking history. She does not have any smokeless tobacco history on file. She reports that she drinks alcohol. She reports that she uses illicit drugs (Marijuana).  Allergies  Allergen Reactions  . Budesonide-Formoterol Fumarate  Swelling    Family History  Problem Relation Age of Onset  . Cancer Mother   . Hypertension Mother   . Diabetes Mother   . Asthma Son     Prior to Admission medications   Medication Sig Start Date End Date Taking? Authorizing Provider  albuterol (PROVENTIL HFA;VENTOLIN HFA) 108 (90 BASE) MCG/ACT inhaler Inhale 2 puffs into the lungs every 4 (four) hours as needed for wheezing or shortness of breath.   Yes Historical Provider, MD  albuterol (PROVENTIL) (2.5 MG/3ML) 0.083% nebulizer solution Take 3 mLs (2.5 mg total) by nebulization every 4 (four) hours as needed for wheezing or shortness of breath. 12/23/14  Yes Loleta Rose, MD  amLODipine (NORVASC) 5 MG tablet Take 5 mg by mouth daily.   Yes Historical Provider, MD  azithromycin (ZITHROMAX) 250 MG tablet Take 250 mg by mouth daily.   Yes Historical Provider, MD  beclomethasone (QVAR) 80 MCG/ACT inhaler Inhale 1 puff into the lungs 2 (two) times daily.   Yes Historical Provider, MD  carvedilol (COREG) 12.5 MG tablet Take 1 tablet (12.5 mg total) by mouth 2 (two) times daily with a meal. 08/13/14  Yes Sital Mody, MD  enalapril (VASOTEC) 20 MG tablet Take 20 mg by mouth 2 (two) times daily.    Yes Historical Provider, MD  famotidine (PEPCID) 20 MG tablet Take 20 mg by mouth 2 (two) times daily.   Yes Historical Provider, MD  fluticasone (FLONASE) 50 MCG/ACT nasal spray Place 1 spray into both nostrils daily.   Yes Historical Provider,  MD  Fluticasone-Salmeterol (ADVAIR) 500-50 MCG/DOSE AEPB Inhale 1 puff into the lungs 2 (two) times daily.   Yes Historical Provider, MD  hydrochlorothiazide (HYDRODIURIL) 25 MG tablet Take 25 mg by mouth daily.   Yes Historical Provider, MD  ipratropium (ATROVENT) 0.02 % nebulizer solution Take 0.5 mg by nebulization 4 (four) times daily as needed for wheezing or shortness of breath.    Yes Historical Provider, MD  loratadine (CLARITIN) 10 MG tablet Take 10 mg by mouth daily.   Yes Historical Provider, MD   LORazepam (ATIVAN) 0.5 MG tablet Take 1 tablet (0.5 mg total) by mouth every 8 (eight) hours as needed for anxiety. 12/23/14  Yes Loleta Rose, MD  montelukast (SINGULAIR) 10 MG tablet Take 10 mg by mouth every evening.   Yes Historical Provider, MD  nitroGLYCERIN (NITROSTAT) 0.4 MG SL tablet Place 0.4 mg under the tongue every 5 (five) minutes as needed for chest pain.   Yes Historical Provider, MD  roflumilast (DALIRESP) 500 MCG TABS tablet Take 500 mcg by mouth daily.   Yes Historical Provider, MD  tiotropium (SPIRIVA) 18 MCG inhalation capsule Place 18 mcg into inhaler and inhale daily.   Yes Historical Provider, MD  citalopram (CELEXA) 10 MG tablet Take 10 mg by mouth daily.    Historical Provider, MD  levofloxacin (LEVAQUIN) 500 MG tablet Take 1 tablet (500 mg total) by mouth daily. Patient not taking: Reported on 01/02/2015 12/11/14   Enid Baas, MD  LORazepam (ATIVAN) 0.5 MG tablet Take 1 tablet (0.5 mg total) by mouth every 8 (eight) hours as needed for anxiety. 01/03/15   Adrian Saran, MD  predniSONE (DELTASONE) 10 MG tablet Take 0.5 tablets (5 mg total) by mouth daily with breakfast. 01/03/15   Adrian Saran, MD  predniSONE (DELTASONE) 20 MG tablet Take 3 tablets (60 mg total) by mouth daily. Patient not taking: Reported on 01/02/2015 12/23/14   Loleta Rose, MD   Physical Exam: Filed Vitals:   01/02/15 2200 01/02/15 2215 01/02/15 2230 01/03/15 0513  BP:   119/88 107/81  Pulse:   102 89  Temp:    98.6 F (37 C)  TempSrc:    Oral  Resp: Height:      Weight:      SpO2:   100% 100%    Wt Readings from Last 3 Encounters:  01/02/15 78.926 kg (174 lb)  12/23/14 75.751 kg (167 lb)  12/10/14 76.8 kg (169 lb 5 oz)    General:  Appears calm and comfortable Eyes: PERRL, normal lids, irises & conjunctiva ENT: grossly normal hearing, lips & tongue Neck: no LAD, masses or thyromegaly Cardiovascular: RRR, no m/r/g. No LE edema. Respiratory: few scattered ronchi no  rales Abdomen: soft, nontender Skin: no rash or induration seen on limited exam Musculoskeletal: grossly normal tone BUE/BLE Psychiatric: grossly normal mood and affect Neurologic: grossly non-focal.          Labs on Admission:  Basic Metabolic Panel:  Recent Labs Lab 01/02/15 1922 01/03/15 0436  NA 140 140  K 4.1 4.2  CL 101 103  CO2 33* 31  GLUCOSE 116* 165*  BUN 20 16  CREATININE 0.89 0.70  CALCIUM 9.0 8.8*   Liver Function Tests:  Recent Labs Lab 01/02/15 1922  AST 29  ALT 33  ALKPHOS 58  BILITOT 0.5  PROT 6.6  ALBUMIN 4.1   No results for input(s): LIPASE, AMYLASE in the last 168 hours. No results for input(s): AMMONIA in the last  168 hours. CBC:  Recent Labs Lab 01/02/15 1922 01/03/15 0436  WBC 7.7 6.1  NEUTROABS 4.6  --   HGB 13.4 12.1  HCT 40.4 36.0  MCV 99.9 99.9  PLT 340 311   Cardiac Enzymes:  Recent Labs Lab 01/02/15 1922  TROPONINI <0.03    BNP (last 3 results)  Recent Labs  09/22/14 2315 12/10/14 1534 01/02/15 1922  BNP 15.0 51.0 28.0    ProBNP (last 3 results) No results for input(s): PROBNP in the last 8760 hours.  CBG: No results for input(s): GLUCAP in the last 168 hours.  Radiological Exams on Admission: Dg Chest Portable 1 View  01/02/2015   CLINICAL DATA:  Shortness of breath.  EXAM: PORTABLE CHEST 1 VIEW  COMPARISON:  December 23, 2014.  FINDINGS: The heart size and mediastinal contours are within normal limits. Both lungs are clear. No pneumothorax or pleural effusion is noted. The visualized skeletal structures are unremarkable.  IMPRESSION: No acute cardiopulmonary abnormality seen.   Electronically Signed   By: Lupita Raider, M.D.   On: 01/02/2015 19:42    EKG: Independently reviewed.  Assessment/Plan Active Problems:   Acute respiratory failure (HCC)   1. Acute respiratory failure with hypoxia -patient is on oxygen would assess the need for oxygen again at home prior to discharge -clinically  improving on current therapy -planned for discharge home with f/u in primary pulmonologist office  2. COPD with exacerbation -states she is on chronic azithromycin for recurring infections per [facility] -taper steroids as ordered     I have personally obtained a history, examined the patient, evaluated laboratory and imaging results, formulated the assessment and plan and placed orders.  The Patient requires high complexity decision making for assessment and support.    Yevonne Pax, MD [facility] Pulmonary Critical Care Medicine Sleep Medicine

## 2015-01-03 NOTE — Plan of Care (Signed)
Problem: Discharge Progression Outcomes Goal: Other Discharge Outcomes/Goals Outcome: Progressing Plan of Care Progress to Goal:   Pt is alert and denies pain, Pt is HOH. No other signs of distress noted. Will continue to monitor.

## 2015-01-03 NOTE — Discharge Instructions (Signed)
Chronic Obstructive Pulmonary Disease °Chronic obstructive pulmonary disease (COPD) is a common lung problem. In COPD, the flow of air from the lungs is limited. The way your lungs work will probably never return to normal, but there are things you can do to improve your lungs and make yourself feel better. Your doctor may treat your condition with: °· Medicines. °· Oxygen. °· Lung surgery. °· Changes to your diet. °· Rehabilitation. This may involve a team of specialists. °HOME CARE °· Take all medicines as told by your doctor. °· Avoid medicines or cough syrups that dry up your airway (such as antihistamines) and do not allow you to get rid of thick spit. You do not need to avoid them if told differently by your doctor. °· If you smoke, stop. Smoking makes the problem worse. °· Avoid being around things that make your breathing worse (like smoke, chemicals, and fumes). °· Use oxygen therapy and therapy to help improve your lungs (pulmonary rehabilitation) if told by your doctor. If you need home oxygen therapy, ask your doctor if you should buy a tool to measure your oxygen level (oximeter). °· Avoid people who have a sickness you can catch (contagious). °· Avoid going outside when it is very hot, cold, or humid. °· Eat healthy foods. Eat smaller meals more often. Rest before meals. °· Stay active, but remember to also rest. °· Make sure to get all the shots (vaccines) your doctor recommends. Ask your doctor if you need a pneumonia shot. °· Learn and use tips on how to relax. °· Learn and use tips on how to control your breathing as told by your doctor. Try: °¨ Breathing in (inhaling) through your nose for 1 second. Then, pucker your lips and breath out (exhale) through your lips for 2 seconds. °¨ Putting one hand on your belly (abdomen). Breathe in slowly through your nose for 1 second. Your hand on your belly should move out. Pucker your lips and breathe out slowly through your lips. Your hand on your belly  should move in as you breathe out. °· Learn and use controlled coughing to clear thick spit from your lungs. The steps are: °1. Lean your head a little forward. °2. Breathe in deeply. °3. Try to hold your breath for 3 seconds. °4. Keep your mouth slightly open while coughing 2 times. °5. Spit any thick spit out into a tissue. °6. Rest and do the steps again 1 or 2 times as needed. °GET HELP IF: °· You cough up more thick spit than usual. °· There is a change in the color or thickness of the spit. °· It is harder to breathe than usual. °· Your breathing is faster than usual. °GET HELP RIGHT AWAY IF: °· You have shortness of breath while resting. °· You have shortness of breath that stops you from: °¨ Being able to talk. °¨ Doing normal activities. °· You chest hurts for longer than 5 minutes. °· Your skin color is more blue than usual. °· Your pulse oximeter shows that you have low oxygen for longer than 5 minutes. °MAKE SURE YOU: °· Understand these instructions. °· Will watch your condition. °· Will get help right away if you are not doing well or get worse. °  °This information is not intended to replace advice given to you by your health care provider. Make sure you discuss any questions you have with your health care provider. °  °Document Released: 09/03/2007 Document Revised: 04/07/2014 Document Reviewed: 11/11/2012 °[employer] Interactive Patient   Education ©2016 [employer] ° °

## 2015-01-03 NOTE — Discharge Summary (Signed)
[facility] - [place] at [facility]   PATIENT NAME: Julie Foley    MR#:  [id number]  DATE OF BIRTH:  1955/09/12  DATE OF ADMISSION:  01/02/2015 ADMITTING PHYSICIAN: Ramonita Lab, MD  DATE OF DISCHARGE: 01/03/2015  PRIMARY CARE PHYSICIAN: Pcp Not In System    ADMISSION DIAGNOSIS:  COPD with exacerbation (HCC) [J44.1]  DISCHARGE DIAGNOSIS:  Active Problems:   Acute respiratory failure (HCC)   SECONDARY DIAGNOSIS:   Past Medical History  Diagnosis Date  . COPD (chronic obstructive pulmonary disease) (HCC)   . CHF (congestive heart failure) (HCC)   . HTN (hypertension)   . GERD (gastroesophageal reflux disease)   . Depression   . Anxiety     HOSPITAL COURSE:   59 y.o. female with a known history of chronic COPD lives on 4 L of home oxygen, congestive heart failure, hypertension, GERD and anxiety is presenting to the ED with a chief complaint of shortness of breath for the past several days. For further details, refer to H and P.  1. Acute on chronic hypoxic respiratory failure secondary to COPD exacerbation: Patient has had multiple admissions for acute COPD exacerbation. I discussed with the patient that she should probably have her home evaluated for mold and other pathogens which may be exacerbating her COPD. Patient was placed on IV steroids and at discharge is doing quite well. She has no wheezing on examination and is speaking full sentences. She is back on her home oxygen. She takes azithromycin daily as per her lung physician which she will continue. She will be discharged on prednisone taper, inhalers and oxygen. She is a follow-up with her pulmonologist in 1 week.  2. Chronic congestive heart failure: Patient will continue on enalapril. She had no signs of exacerbation at this time.  3. Essential hypertension: Continue enalapril, Norvasc and HCTZ 4. Anxiety: Continue Ativan when necessary    DISCHARGE CONDITIONS AND DIET:  Patient is to  be discharged home in stable condition on a heart healthy diet  CONSULTS OBTAINED:  Treatment Team:  Ramonita Lab, MD Yevonne Pax, MD  DRUG ALLERGIES:   Allergies  Allergen Reactions  . Budesonide-Formoterol Fumarate Swelling    DISCHARGE MEDICATIONS:   Current Discharge Medication List    CONTINUE these medications which have CHANGED   Details  !! LORazepam (ATIVAN) 0.5 MG tablet Take 1 tablet (0.5 mg total) by mouth every 8 (eight) hours as needed for anxiety. Qty: 30 tablet, Refills: 0    predniSONE (DELTASONE) 10 MG tablet Take 0.5 tablets (5 mg total) by mouth daily with breakfast. Qty: 42 tablet, Refills: 0     !! - Potential duplicate medications found. Please discuss with provider.    CONTINUE these medications which have NOT CHANGED   Details  albuterol (PROVENTIL HFA;VENTOLIN HFA) 108 (90 BASE) MCG/ACT inhaler Inhale 2 puffs into the lungs every 4 (four) hours as needed for wheezing or shortness of breath.    albuterol (PROVENTIL) (2.5 MG/3ML) 0.083% nebulizer solution Take 3 mLs (2.5 mg total) by nebulization every 4 (four) hours as needed for wheezing or shortness of breath. Qty: 75 mL, Refills: 2    amLODipine (NORVASC) 5 MG tablet Take 5 mg by mouth daily.    azithromycin (ZITHROMAX) 250 MG tablet Take 250 mg by mouth daily.    beclomethasone (QVAR) 80 MCG/ACT inhaler Inhale 1 puff into the lungs 2 (two) times daily.    carvedilol (COREG) 12.5 MG tablet Take 1 tablet (12.5 mg total)  by mouth 2 (two) times daily with a meal. Qty: 30 tablet, Refills: 0    enalapril (VASOTEC) 20 MG tablet Take 20 mg by mouth 2 (two) times daily.     famotidine (PEPCID) 20 MG tablet Take 20 mg by mouth 2 (two) times daily.    fluticasone (FLONASE) 50 MCG/ACT nasal spray Place 1 spray into both nostrils daily.    Fluticasone-Salmeterol (ADVAIR) 500-50 MCG/DOSE AEPB Inhale 1 puff into the lungs 2 (two) times daily.    hydrochlorothiazide (HYDRODIURIL) 25 MG tablet Take 25  mg by mouth daily.    ipratropium (ATROVENT) 0.02 % nebulizer solution Take 0.5 mg by nebulization 4 (four) times daily as needed for wheezing or shortness of breath.     loratadine (CLARITIN) 10 MG tablet Take 10 mg by mouth daily.    !! LORazepam (ATIVAN) 0.5 MG tablet Take 1 tablet (0.5 mg total) by mouth every 8 (eight) hours as needed for anxiety. Qty: 10 tablet, Refills: 0    montelukast (SINGULAIR) 10 MG tablet Take 10 mg by mouth every evening.    nitroGLYCERIN (NITROSTAT) 0.4 MG SL tablet Place 0.4 mg under the tongue every 5 (five) minutes as needed for chest pain.    roflumilast (DALIRESP) 500 MCG TABS tablet Take 500 mcg by mouth daily.    tiotropium (SPIRIVA) 18 MCG inhalation capsule Place 18 mcg into inhaler and inhale daily.    citalopram (CELEXA) 10 MG tablet Take 10 mg by mouth daily.     !! - Potential duplicate medications found. Please discuss with provider.    STOP taking these medications     levofloxacin (LEVAQUIN) 500 MG tablet               Today   CHIEF COMPLAINT:  Patient is doing well this morning. Patient reports she would like to go home. Patient denies chest pain or increasing shortness of breath or dyspnea exertion. She wears oxygen 3-4 L at home.   VITAL SIGNS:  Blood pressure 107/81, pulse 89, temperature 98.6 F (37 C), temperature source Oral, resp. rate 18, height 5\' 2"  (1.575 m), weight 78.926 kg (174 lb), SpO2 100 %.   REVIEW OF SYSTEMS:  Review of Systems  Constitutional: Negative for fever, chills and malaise/fatigue.  HENT: Negative for sore throat.   Eyes: Negative for blurred vision.  Respiratory: Negative for cough, hemoptysis, shortness of breath and wheezing.   Cardiovascular: Negative for chest pain, palpitations and leg swelling.  Gastrointestinal: Negative for nausea, vomiting, abdominal pain, diarrhea and blood in stool.  Genitourinary: Negative for dysuria.  Musculoskeletal: Negative for back pain.   Neurological: Negative for dizziness, tremors and headaches.  Endo/Heme/Allergies: Does not bruise/bleed easily.     PHYSICAL EXAMINATION:  GENERAL:  59 y.o.-year-old patient lying in the bed with no acute distress.  NECK:  Supple, no jugular venous distention. No thyroid enlargement, no tenderness.  LUNGS: Normal breath sounds bilaterally, no wheezing, rales,rhonchi  No use of accessory muscles of respiration.  CARDIOVASCULAR: S1, S2 normal. No murmurs, rubs, or gallops.  ABDOMEN: Soft, non-tender, non-distended. Bowel sounds present. No organomegaly or mass.  EXTREMITIES: No pedal edema, cyanosis, or clubbing.  PSYCHIATRIC: The patient is alert and oriented x 3.  SKIN: No obvious rash, lesion, or ulcer.   DATA REVIEW:   CBC  Recent Labs Lab 01/03/15 0436  WBC 6.1  HGB 12.1  HCT 36.0  PLT 311    Chemistries   Recent Labs Lab 01/02/15 1922 01/03/15 0436  NA  140 140  K 4.1 4.2  CL 101 103  CO2 33* 31  GLUCOSE 116* 165*  BUN 20 16  CREATININE 0.89 0.70  CALCIUM 9.0 8.8*  AST 29  --   ALT 33  --   ALKPHOS 58  --   BILITOT 0.5  --     Cardiac Enzymes  Recent Labs Lab 01/02/15 1922  TROPONINI <0.03    Microbiology Results  @  RADIOLOGY:  Dg Chest Portable 1 View  01/02/2015   CLINICAL DATA:  Shortness of breath.  EXAM: PORTABLE CHEST 1 VIEW  COMPARISON:  December 23, 2014.  FINDINGS: The heart size and mediastinal contours are within normal limits. Both lungs are clear. No pneumothorax or pleural effusion is noted. The visualized skeletal structures are unremarkable.  IMPRESSION: No acute cardiopulmonary abnormality seen.   Electronically Signed   By: Lupita Raider, M.D.   On: 01/02/2015 19:42      Management plans discussed with the patient and she is in agreement. Stable for discharge home  Patient should follow up with PCP and pulmonologist in 1 week  CODE STATUS:     Code Status Orders        Start     Ordered   01/02/15  2342  Full code   Continuous     01/02/15 2342    Advance Directive Documentation        Most Recent Value   Type of Advance Directive  Healthcare Power of Attorney   Pre-existing out of facility DNR order (yellow form or pink MOST form)     "MOST" Form in Place?        TOTAL TIME TAKING CARE OF THIS PATIENT: 35 minutes.    Yitzchok Carriger M.D on 01/03/2015 at 10:41 AM  Between 7am to 6pm - Pager - [phone] After 6pm go to [email or web address] - password [email or web address]  [facility]  Office  [phone]  CC: Primary care physician; Pcp Not In System

## 2015-01-07 LAB — CULTURE, BLOOD (ROUTINE X 2)
CULTURE: NO GROWTH
Culture: NO GROWTH

## 2015-01-25 ENCOUNTER — Inpatient Hospital Stay
Admit: 2015-01-25 | Discharge: 2015-01-25 | Disposition: A | Discharge status: Home / Self Care | Payer: Medicaid Other | Attending: Internal Medicine | Admitting: Internal Medicine

## 2015-01-25 ENCOUNTER — Encounter: Payer: Self-pay | Admitting: *Deleted

## 2015-01-25 ENCOUNTER — Inpatient Hospital Stay
Admission: EM | Admit: 2015-01-25 | Discharge: 2015-01-25 | DRG: 190 | Discharge status: Against Medical Advice | Payer: Medicaid Other | Attending: Internal Medicine | Admitting: Internal Medicine

## 2015-01-25 ENCOUNTER — Emergency Department: Payer: Medicaid Other

## 2015-01-25 DIAGNOSIS — Z87891 Personal history of nicotine dependence: Secondary | ICD-10-CM | POA: Diagnosis not present

## 2015-01-25 DIAGNOSIS — I110 Hypertensive heart disease with heart failure: Secondary | ICD-10-CM | POA: Diagnosis present

## 2015-01-25 DIAGNOSIS — F419 Anxiety disorder, unspecified: Secondary | ICD-10-CM | POA: Diagnosis present

## 2015-01-25 DIAGNOSIS — Z79899 Other long term (current) drug therapy: Secondary | ICD-10-CM | POA: Diagnosis not present

## 2015-01-25 DIAGNOSIS — K219 Gastro-esophageal reflux disease without esophagitis: Secondary | ICD-10-CM | POA: Diagnosis present

## 2015-01-25 DIAGNOSIS — J9621 Acute and chronic respiratory failure with hypoxia: Secondary | ICD-10-CM | POA: Diagnosis present

## 2015-01-25 DIAGNOSIS — J9601 Acute respiratory failure with hypoxia: Secondary | ICD-10-CM | POA: Diagnosis present

## 2015-01-25 DIAGNOSIS — I10 Essential (primary) hypertension: Secondary | ICD-10-CM | POA: Diagnosis present

## 2015-01-25 DIAGNOSIS — Z7952 Long term (current) use of systemic steroids: Secondary | ICD-10-CM | POA: Diagnosis not present

## 2015-01-25 DIAGNOSIS — Z888 Allergy status to other drugs, medicaments and biological substances status: Secondary | ICD-10-CM

## 2015-01-25 DIAGNOSIS — I509 Heart failure, unspecified: Secondary | ICD-10-CM | POA: Diagnosis present

## 2015-01-25 DIAGNOSIS — J441 Chronic obstructive pulmonary disease with (acute) exacerbation: Secondary | ICD-10-CM | POA: Diagnosis not present

## 2015-01-25 LAB — CBC
HCT: 38.5 % (ref 35.0–47.0)
HEMOGLOBIN: 12.9 g/dL (ref 12.0–16.0)
MCH: 33.1 pg (ref 26.0–34.0)
MCHC: 33.4 g/dL (ref 32.0–36.0)
MCV: 98.9 fL (ref 80.0–100.0)
PLATELETS: 355 10*3/uL (ref 150–440)
RBC: 3.9 MIL/uL (ref 3.80–5.20)
RDW: 13.2 % (ref 11.5–14.5)
WBC: 7.8 10*3/uL (ref 3.6–11.0)

## 2015-01-25 LAB — BASIC METABOLIC PANEL
ANION GAP: 9 (ref 5–15)
BUN: 12 mg/dL (ref 6–20)
CHLORIDE: 104 mmol/L (ref 101–111)
CO2: 28 mmol/L (ref 22–32)
CREATININE: 0.76 mg/dL (ref 0.44–1.00)
Calcium: 9.4 mg/dL (ref 8.9–10.3)
GFR calc non Af Amer: >60 mL/min (ref >60)
Glucose, Bld: 135 mg/dL — ABNORMAL HIGH (ref 65–99)
POTASSIUM: 4.1 mmol/L (ref 3.5–5.1)
SODIUM: 141 mmol/L (ref 135–145)

## 2015-01-25 LAB — TROPONIN I: Troponin I: <0.03 ng/mL (ref <0.031)

## 2015-01-25 MED ORDER — HYDROCHLOROTHIAZIDE 25 MG PO TABS
25.0000 mg | ORAL_TABLET | Freq: Every day | ORAL | Status: DC
  Administered 2015-01-25: 25 mg via ORAL
  Filled 2015-01-25: qty 1

## 2015-01-25 MED ORDER — METHYLPREDNISOLONE SODIUM SUCC 125 MG IJ SOLR
60.0000 mg | Freq: Four times a day (QID) | INTRAMUSCULAR | Status: DC
  Administered 2015-01-25 (×2): 60 mg via INTRAVENOUS
  Filled 2015-01-25 (×2): qty 2

## 2015-01-25 MED ORDER — ACETAMINOPHEN 325 MG PO TABS: 650.0000 mg | ORAL_TABLET | Freq: Four times a day (QID) | ORAL | Status: DC | PRN

## 2015-01-25 MED ORDER — IPRATROPIUM-ALBUTEROL 0.5-2.5 (3) MG/3ML IN SOLN
RESPIRATORY_TRACT | Status: AC
  Filled 2015-01-25: qty 6

## 2015-01-25 MED ORDER — LORAZEPAM 0.5 MG PO TABS
0.5000 mg | ORAL_TABLET | Freq: Three times a day (TID) | ORAL | Status: DC | PRN
  Administered 2015-01-25: 0.5 mg via ORAL
  Filled 2015-01-25: qty 1

## 2015-01-25 MED ORDER — ENALAPRIL MALEATE 5 MG PO TABS
20.0000 mg | ORAL_TABLET | Freq: Two times a day (BID) | ORAL | Status: DC
  Administered 2015-01-25: 20 mg via ORAL
  Filled 2015-01-25: qty 4

## 2015-01-25 MED ORDER — ENOXAPARIN SODIUM 40 MG/0.4ML ~~LOC~~ SOLN: 40.0000 mg | SUBCUTANEOUS | Status: DC

## 2015-01-25 MED ORDER — FLUTICASONE-SALMETEROL 500-50 MCG/DOSE IN AEPB
1.0000 | INHALATION_SPRAY | Freq: Two times a day (BID) | RESPIRATORY_TRACT | Status: DC
  Administered 2015-01-25: 1 via RESPIRATORY_TRACT
  Filled 2015-01-25: qty 60

## 2015-01-25 MED ORDER — MONTELUKAST SODIUM 10 MG PO TABS
10.0000 mg | ORAL_TABLET | Freq: Every evening | ORAL | Status: DC
  Administered 2015-01-25: 10 mg via ORAL
  Filled 2015-01-25: qty 1

## 2015-01-25 MED ORDER — IPRATROPIUM-ALBUTEROL 0.5-2.5 (3) MG/3ML IN SOLN
3.0000 mL | RESPIRATORY_TRACT | Status: DC | PRN
Start: 2015-01-25 — End: 2015-01-25
  Administered 2015-01-25 (×3): 3 mL via RESPIRATORY_TRACT
  Filled 2015-01-25 (×2): qty 3

## 2015-01-25 MED ORDER — AZITHROMYCIN 250 MG PO TABS
500.0000 mg | ORAL_TABLET | Freq: Every day | ORAL | Status: DC
  Administered 2015-01-25: 500 mg via ORAL
  Filled 2015-01-25: qty 2

## 2015-01-25 MED ORDER — CITALOPRAM HYDROBROMIDE 20 MG PO TABS
10.0000 mg | ORAL_TABLET | Freq: Every day | ORAL | Status: DC
  Filled 2015-01-25: qty 2

## 2015-01-25 MED ORDER — CARVEDILOL 12.5 MG PO TABS
12.5000 mg | ORAL_TABLET | Freq: Two times a day (BID) | ORAL | Status: DC
  Administered 2015-01-25 (×2): 12.5 mg via ORAL
  Filled 2015-01-25 (×2): qty 1

## 2015-01-25 MED ORDER — ACETAMINOPHEN 650 MG RE SUPP: 650.0000 mg | Freq: Four times a day (QID) | RECTAL | Status: DC | PRN

## 2015-01-25 MED ORDER — MOMETASONE FURO-FORMOTEROL FUM 200-5 MCG/ACT IN AERO: 2.0000 | INHALATION_SPRAY | Freq: Two times a day (BID) | RESPIRATORY_TRACT | Status: DC

## 2015-01-25 MED ORDER — ONDANSETRON HCL 4 MG PO TABS: 4.0000 mg | ORAL_TABLET | Freq: Four times a day (QID) | ORAL | Status: DC | PRN

## 2015-01-25 MED ORDER — AMLODIPINE BESYLATE 5 MG PO TABS
5.0000 mg | ORAL_TABLET | Freq: Every day | ORAL | Status: DC
  Administered 2015-01-25: 5 mg via ORAL
  Filled 2015-01-25: qty 1

## 2015-01-25 MED ORDER — IPRATROPIUM-ALBUTEROL 0.5-2.5 (3) MG/3ML IN SOLN
6.0000 mL | Freq: Once | RESPIRATORY_TRACT | Status: AC
  Administered 2015-01-25: 6 mL via RESPIRATORY_TRACT

## 2015-01-25 MED ORDER — ROFLUMILAST 500 MCG PO TABS
500.0000 ug | ORAL_TABLET | Freq: Every day | ORAL | Status: DC
  Administered 2015-01-25: 500 ug via ORAL
  Filled 2015-01-25: qty 1

## 2015-01-25 MED ORDER — ONDANSETRON HCL 4 MG/2ML IJ SOLN: 4.0000 mg | Freq: Four times a day (QID) | INTRAMUSCULAR | Status: DC | PRN

## 2015-01-25 MED ORDER — TIOTROPIUM BROMIDE MONOHYDRATE 18 MCG IN CAPS
18.0000 ug | ORAL_CAPSULE | Freq: Every day | RESPIRATORY_TRACT | Status: DC
  Administered 2015-01-25: 18 ug via RESPIRATORY_TRACT
  Filled 2015-01-25: qty 5

## 2015-01-25 NOTE — Progress Notes (Signed)
[facility] - [place] at [facility]   PATIENT NAME: Julie Foley    MR#:  [id number]  DATE OF BIRTH:  Apr 13, 1955  SUBJECTIVE:  CHIEF COMPLAINT:   Chief Complaint  Patient presents with  . Respiratory Distress   Was doing very well on my initial assessment this morning. Felt back to baseline. Breathing and conversing comfortably on 4 L nasal cannula. However this afternoon she has become more dyspneic and has requested to be placed back on BiPAP  REVIEW OF SYSTEMS:   Review of Systems  Constitutional: Negative for fever.  Respiratory: Positive for cough, shortness of breath and wheezing.   Cardiovascular: Negative for chest pain and palpitations.  Gastrointestinal: Negative for nausea, vomiting and abdominal pain.  Genitourinary: Negative for dysuria.  Psychiatric/Behavioral: The patient is nervous/anxious.     DRUG ALLERGIES:   Allergies  Allergen Reactions  . Budesonide-Formoterol Fumarate Swelling    VITALS:  Blood pressure 145/75, pulse 99, resp. rate 20, height  (1.575 m), weight 77.474 kg (170 lb 12.8 oz), SpO2 94 %.  PHYSICAL EXAMINATION:  GENERAL:  59 y.o.-year-old patient lying in the bed with no acute distress. Nasal cannula in place LUNGS: Scattered wheezes, good air movement, no rhonchi, no crackles, no distress  CARDIOVASCULAR: S1, S2 normal. No murmurs, rubs, or gallops.  ABDOMEN: Soft, nontender, nondistended. Bowel sounds present. No organomegaly or mass.  EXTREMITIES: No pedal edema, cyanosis, or clubbing.  NEUROLOGIC: Cranial nerves II through XII are intact. Muscle strength 5/5 in all extremities. Sensation intact. Gait not checked.  PSYCHIATRIC: The patient is alert and oriented x 3. Calm at this time but discusses significant anxiety with dyspnea and with specific triggers SKIN: No obvious rash, lesion, or ulcer.    LABORATORY PANEL:   CBC  Recent Labs Lab 01/25/15 0309  WBC 7.8  HGB 12.9  HCT 38.5  PLT 355    ------------------------------------------------------------------------------------------------------------------  Chemistries   Recent Labs Lab 01/25/15 0309  NA 141  K 4.1  CL 104  CO2 28  GLUCOSE 135*  BUN 12  CREATININE 0.76  CALCIUM 9.4   ------------------------------------------------------------------------------------------------------------------  Cardiac Enzymes  Recent Labs Lab 01/25/15 0309  TROPONINI <0.03   ------------------------------------------------------------------------------------------------------------------  RADIOLOGY:  Dg Chest Port 1 View  01/25/2015  CLINICAL DATA:  Acute onset of wheezing and shortness of breath. Difficulty breathing. Initial encounter. EXAM: PORTABLE CHEST 1 VIEW COMPARISON:  Chest radiograph performed 01/02/2015 FINDINGS: The lungs are well-aerated. Minimal bibasilar atelectasis is noted. There is no evidence of pleural effusion or pneumothorax. Bilateral nipple shadows are noted. The cardiomediastinal silhouette is within normal limits. No acute osseous abnormalities are seen. IMPRESSION: Minimal bibasilar atelectasis noted.  Lungs otherwise clear. Electronically Signed   By: Roanna Raider M.D.   On: 01/25/2015 03:07    EKG:   Orders placed or performed during the hospital encounter of 01/25/15  . ED EKG  . ED EKG    ASSESSMENT AND PLAN:   #1 acute on chronic respiratory failure with hypoxia: She is on 4 L nasal cannula at home due to advanced COPD. On admission required BiPAP, seemed to be doing better this morning with standard therapy of nebulizer steroids and antibiotics but this afternoon has declined again. Now requiring BiPAP again. She follows with [facility] pulmonology. She reports that she has run out of nebulizer solution at home and needs refills. She also reports that she has significant anxiety around shortness of breath and once she starts to become short of breath the  anxiety escalates. She usually takes  Xanax for this but she has run out of this medication as well. Due to her advanced COPD and lack of progress with standard therapy as well as frequent readmissions I will consult pulmonology for assistance.  #2 hypertension blood pressures well controlled. Continue enalapril, carvedilol, hydrochlorothiazide, amlodipine  #3 CHF no documented echo. I will obtain. Continue ACE inhibitor, beta blocker  #4 GERD continue PPI  #5 anxiety continue lorazepam as needed  CODE STATUS: Full  TOTAL TIME TAKING CARE OF THIS PATIENT: 35 minutes.  Greater than 50% of time spent in care coordination and counseling. POSSIBLE D/C IN 1-2 DAYS, DEPENDING ON CLINICAL CONDITION.   Elby ShowersWALSH, Darrelyn Morro M.D on 01/25/2015 at 12:07 PM  Between 7am to 6pm - Pager - [phone]  After 6pm go to [email or web address] - password [email or web address]  [facility]  Office  [phone]  CC: Primary care physician; Pcp Not In System

## 2015-01-25 NOTE — Progress Notes (Signed)
*  PRELIMINARY RESULTS* Echocardiogram 2D Echocardiogram has been performed.  Julie Foley 01/25/2015, 4:59 PM

## 2015-01-25 NOTE — ED Notes (Signed)
X-ray at bedside at this time.

## 2015-01-25 NOTE — H&P (Signed)
[facility] - [place] at [facility]   PATIENT NAME: Julie Foley    MR#:  [id number]  DATE OF BIRTH:  1955/12/23  DATE OF ADMISSION:  01/25/2015  PRIMARY CARE PHYSICIAN: Pcp Not In System   REQUESTING/REFERRING PHYSICIAN: Manson Passey, M.D.  CHIEF COMPLAINT:   Chief Complaint  Patient presents with  . Respiratory Distress    HISTORY OF PRESENT ILLNESS:  Julie Foley  is a 59 y.o. female who presents with respiratory distress and hypoxia. She has a known history of COPD with exacerbations that required hospitalization in the past. She presents to the ED today in respiratory distress and hypoxic requiring BiPAP. She states that she was at home today, and had run out of her albuterol nebulizer. She had only her ipratropium. She was caring for her grandchild and "running after him" a lot. She then became short of breath. Her symptoms progressed despite taking her medications, and she came to the ED. Lab workup largely benign. The patient's symptoms are improving on BiPAP, though she is still requiring it at this time after multiple nebs and Solu-Medrol. Hospitalists were called for admission for acute respiratory failure with hypoxemia and COPD exacerbation.  PAST MEDICAL HISTORY:   Past Medical History  Diagnosis Date  . COPD (chronic obstructive pulmonary disease) (HCC)   . CHF (congestive heart failure) (HCC)   . HTN (hypertension)   . GERD (gastroesophageal reflux disease)   . Depression   . Anxiety     PAST SURGICAL HISTORY:   Past Surgical History  Procedure Laterality Date  . Tubal ligation Bilateral     SOCIAL HISTORY:   Social History  Substance Use Topics  . Smoking status: Former Smoker -- 2.00 packs/day for 35 years    Types: Cigarettes  . Smokeless tobacco: Not on file  . Alcohol Use: Yes    FAMILY HISTORY:   Family History  Problem Relation Age of Onset  . Cancer Mother   . Hypertension Mother   . Diabetes Mother   . Asthma Son      DRUG ALLERGIES:   Allergies  Allergen Reactions  . Budesonide-Formoterol Fumarate Swelling    MEDICATIONS AT HOME:   Prior to Admission medications   Medication Sig Start Date End Date Taking? Authorizing Provider  albuterol (PROVENTIL HFA;VENTOLIN HFA) 108 (90 BASE) MCG/ACT inhaler Inhale 2 puffs into the lungs every 4 (four) hours as needed for wheezing or shortness of breath.    Historical Provider, MD  albuterol (PROVENTIL) (2.5 MG/3ML) 0.083% nebulizer solution Take 3 mLs (2.5 mg total) by nebulization every 4 (four) hours as needed for wheezing or shortness of breath. 12/23/14   Loleta Rose, MD  amLODipine (NORVASC) 5 MG tablet Take 5 mg by mouth daily.    Historical Provider, MD  azithromycin (ZITHROMAX) 250 MG tablet Take 250 mg by mouth daily.    Historical Provider, MD  carvedilol (COREG) 12.5 MG tablet Take 1 tablet (12.5 mg total) by mouth 2 (two) times daily with a meal. 08/13/14   Adrian Saran, MD  citalopram (CELEXA) 10 MG tablet Take 10 mg by mouth daily.    Historical Provider, MD  enalapril (VASOTEC) 20 MG tablet Take 20 mg by mouth 2 (two) times daily.     Historical Provider, MD  famotidine (PEPCID) 20 MG tablet Take 20 mg by mouth 2 (two) times daily.    Historical Provider, MD  fluticasone (FLONASE) 50 MCG/ACT nasal spray Place 1 spray into both nostrils daily.  Historical Provider, MD  Fluticasone-Salmeterol (ADVAIR) 500-50 MCG/DOSE AEPB Inhale 1 puff into the lungs 2 (two) times daily.    Historical Provider, MD  hydrochlorothiazide (HYDRODIURIL) 25 MG tablet Take 25 mg by mouth daily.    Historical Provider, MD  ipratropium (ATROVENT) 0.02 % nebulizer solution Take 0.5 mg by nebulization 4 (four) times daily as needed for wheezing or shortness of breath.     Historical Provider, MD  loratadine (CLARITIN) 10 MG tablet Take 10 mg by mouth daily.    Historical Provider, MD  LORazepam (ATIVAN) 0.5 MG tablet Take 1 tablet (0.5 mg total) by mouth every 8 (eight) hours  as needed for anxiety. 12/23/14   Loleta Rose, MD  LORazepam (ATIVAN) 0.5 MG tablet Take 1 tablet (0.5 mg total) by mouth every 8 (eight) hours as needed for anxiety. 01/03/15   Sital Mody, MD  montelukast (SINGULAIR) 10 MG tablet Take 10 mg by mouth every evening.    Historical Provider, MD  nitroGLYCERIN (NITROSTAT) 0.4 MG SL tablet Place 0.4 mg under the tongue every 5 (five) minutes as needed for chest pain.    Historical Provider, MD  predniSONE (DELTASONE) 10 MG tablet Take 0.5 tablets (5 mg total) by mouth daily with breakfast. 01/03/15   Adrian Saran, MD  roflumilast (DALIRESP) 500 MCG TABS tablet Take 500 mcg by mouth daily.    Historical Provider, MD  tiotropium (SPIRIVA) 18 MCG inhalation capsule Place 18 mcg into inhaler and inhale daily.    Historical Provider, MD    REVIEW OF SYSTEMS:  Review of Systems  Constitutional: Negative for fever, chills, weight loss and malaise/fatigue.  HENT: Negative for ear pain, hearing loss and tinnitus.   Eyes: Negative for blurred vision, double vision, pain and redness.  Respiratory: Positive for cough, shortness of breath and wheezing. Negative for hemoptysis.   Cardiovascular: Negative for chest pain, palpitations, orthopnea and leg swelling.  Gastrointestinal: Negative for nausea, vomiting, abdominal pain, diarrhea and constipation.  Genitourinary: Negative for dysuria, frequency and hematuria.  Musculoskeletal: Negative for back pain, joint pain and neck pain.  Skin:       No acne, rash, or lesions  Neurological: Negative for dizziness, tremors, focal weakness and weakness.  Endo/Heme/Allergies: Negative for polydipsia. Does not bruise/bleed easily.  Psychiatric/Behavioral: Negative for depression. The patient is not nervous/anxious and does not have insomnia.      VITAL SIGNS:   Filed Vitals:   01/25/15 0216 01/25/15 0218 01/25/15 0301  BP:   134/97  Pulse: 140  130  Resp: 26  26  Weight: 78.926 kg (174 lb)    SpO2: 95% 92% 100%    Wt Readings from Last 3 Encounters:  01/25/15 78.926 kg (174 lb)  01/02/15 78.926 kg (174 lb)  12/23/14 75.751 kg (167 lb)    PHYSICAL EXAMINATION:  Physical Exam  Vitals reviewed. Constitutional: She is oriented to person, place, and time. She appears well-developed and well-nourished. No distress.  HENT:  Head: Normocephalic and atraumatic.  Mouth/Throat: Oropharynx is clear and moist.  Eyes: Conjunctivae and EOM are normal. Pupils are equal, round, and reactive to light. No scleral icterus.  Neck: Normal range of motion. Neck supple. No JVD present. No thyromegaly present.  Cardiovascular: Regular rhythm and intact distal pulses.  Exam reveals no gallop and no friction rub.   No murmur heard. Tachycardic  Respiratory: She is in respiratory distress. She has wheezes. She has no rales.  Rhonchi  GI: Soft. Bowel sounds are normal. She exhibits no distension.  There is no tenderness.  Musculoskeletal: Normal range of motion. She exhibits no edema.  No arthritis, no gout  Lymphadenopathy:    She has no cervical adenopathy.  Neurological: She is alert and oriented to person, place, and time. No cranial nerve deficit.  No dysarthria, no aphasia  Skin: Skin is warm and dry. No rash noted. No erythema.  Psychiatric: She has a normal mood and affect. Her behavior is normal. Judgment and thought content normal.    LABORATORY PANEL:   CBC  Recent Labs Lab 01/25/15 0309  WBC 7.8  HGB 12.9  HCT 38.5  PLT 355   ------------------------------------------------------------------------------------------------------------------  Chemistries   Recent Labs Lab 01/25/15 0309  NA 141  K 4.1  CL 104  CO2 28  GLUCOSE 135*  BUN 12  CREATININE 0.76  CALCIUM 9.4   ------------------------------------------------------------------------------------------------------------------  Cardiac Enzymes  Recent Labs Lab 01/25/15 0309  TROPONINI <0.03    ------------------------------------------------------------------------------------------------------------------  RADIOLOGY:  Dg Chest Port 1 View  01/25/2015  CLINICAL DATA:  Acute onset of wheezing and shortness of breath. Difficulty breathing. Initial encounter. EXAM: PORTABLE CHEST 1 VIEW COMPARISON:  Chest radiograph performed 01/02/2015 FINDINGS: The lungs are well-aerated. Minimal bibasilar atelectasis is noted. There is no evidence of pleural effusion or pneumothorax. Bilateral nipple shadows are noted. The cardiomediastinal silhouette is within normal limits. No acute osseous abnormalities are seen. IMPRESSION: Minimal bibasilar atelectasis noted.  Lungs otherwise clear. Electronically Signed   By: Roanna RaiderJeffery  Chang M.D.   On: 01/25/2015 03:07    EKG:   Orders placed or performed during the hospital encounter of 01/25/15  . ED EKG  . ED EKG    IMPRESSION AND PLAN:  Principal Problem:   Acute respiratory failure with hypoxemia (HCC) - admit, continue BiPAP for now. Symptoms improving some, wean as able.  Active Problems:   COPD exacerbation (HCC) - Continue Solu-Medrol, and when necessary duo nebs. We'll give azithromycin as well. Continue home inhalers   HTN (hypertension) - at goal, continue home meds   CHF (congestive heart failure) (HCC) - continue home medications for this   GERD (gastroesophageal reflux disease) - equivalent home dose PPI   Anxiety - continue home anxiolytics  All the records are reviewed and case discussed with ED provider. Management plans discussed with the patient and/or family.  DVT PROPHYLAXIS: SubQ lovenox  ADMISSION STATUS: Inpatient  CODE STATUS: Full  TOTAL CRITICAL CARE TIME TAKING CARE OF THIS PATIENT: 45 minutes.    Will Schier FIELDING 01/25/2015, 4:11 AM  [facility]  Office  [phone]  CC: Primary care physician; Pcp Not In System

## 2015-01-25 NOTE — ED Notes (Signed)
Pt taken off of Bipap and placed on a non-rebreather. Pt is stating at 100 % after 10 minutes. Respiratory notified and report to RN that pt can be placed on [place] is stats remain at 100%. Pt also was able to ambulate to bathroom with little increase in WOB.

## 2015-01-25 NOTE — Progress Notes (Signed)
Pt requesting to be discharged from hospital/ pt continues to be sob and wheezing/ Dr. Clent RidgesWalsh paged and spoke with pt over phone/ pt states she wants to leave/ AMA form signed/ iv and tele removed/ transported off  Unit via wheelchair.

## 2015-01-25 NOTE — ED Notes (Signed)
Pt arrived via EMS from home in respiratory distress for the past three hours. Pt on NRB upon arrival with audible wheezing noted. Pt is unable to speak clearly. EMS administered 1 duoneb, 2 albuterol and 125 solumedrol in route.

## 2015-01-25 NOTE — Care Management Note (Signed)
Case Management Note  Patient Details  Name: Julie Foley MRN: [id number] Date of Birth: 1955-07-24  Subjective/Objective:                   Patient taking nebulizer but insists on talking with CM.  She appears extremely short of breath.  She has chronic home 02 [facility].  She [facility]  [facility] but  says" we just could not hook up.I had things to do.  Informed during [facility] meeting that [facility].  She says she wasn't a bipapp machine "but they only did that blood test and it did not work." She does not currently have cpap and says has not had a sleep study.  Unable to find documentation that patient has failed cpap or been ruled out for osa.  Discussed that she would require outpatient sleep study.  She [facility].   Dr Steve RattlerSturkie at ambulatory clinic and Dr Tula NakayamaBice- pulmonary  She says she does not want  to even discuss home health nursing.  " I am going home and it is not fair that they will not let me go." Says she has to ride the [employer] [facility] and "they come and pick me up at [facility] for a noon appointment."  I lost my medicaid transportation because  I did not do my annual review- nobody told me about that."  Patient now says that she would be agreeable  to home health and [facility].     Briefly discussed whether she would consider changing physicians to local.  Patient does not have an opinion on the mater at present.  Action/Plan:   Expected Discharge Date:                  Expected Discharge Plan:     In-House Referral:     Discharge planning Services  home health nursing and social work.  SW to address her transportation issues- navigate DSS to reinstate her  Post Acute Care Choice:    Choice offered to:     DME Arranged:    DME Agency:     HH Arranged:    HH Agency:     Status of Service:     Medicare Important Message Given:    Date Medicare  IM Given:    Medicare IM give by:    Date Additional Medicare IM Given:    Additional Medicare Important Message give by:     If discussed at Long Length of Stay Meetings, dates discussed:    Additional Comments:  Eber HongGreene, Lauriana Denes R, RN 01/25/2015, 3:39 PM

## 2015-01-25 NOTE — Consult Note (Signed)
Pulmonary Critical Care  Initial Consult Note   Julie BunSylvia L Eppolito JYN:[id number] DOB: 10-22-1955 DOA: 01/25/2015  Referring physician: Elby Showersatherine Walsh, MD PCP: Pcp Not In System   Chief Complaint: COPD  HPI: Julie Foley is a 59 y.o. female with history of COPD multiple admissions presented to the ED with increased shortness of breath. Patient was noted to have increased SOB going on for several days prior. Patient came into the ED and was significantly hypoxic. She was started on BIPAP with some improvement. She now wants to go home. Patient states taht she can use her oxygen at home. She states that she normally takes 4lpm at home. Pateint denies smoking at this time. In the hospital she has been treated with steroids nebs and antibiotics and now she states she wants to go home though she is still short of breath with exertion.   Review of Systems:  Complet 12 point ROS performed and is unremarkable other than noted in HPI  Past Medical History  Diagnosis Date  . COPD (chronic obstructive pulmonary disease) (HCC)   . CHF (congestive heart failure) (HCC)   . HTN (hypertension)   . GERD (gastroesophageal reflux disease)   . Depression   . Anxiety    Past Surgical History  Procedure Laterality Date  . Tubal ligation Bilateral    Social History:  reports that she has quit smoking. Her smoking use included Cigarettes. She has a 70 pack-year smoking history. She does not have any smokeless tobacco history on file. She reports that she drinks alcohol. She reports that she uses illicit drugs (Marijuana).  Allergies  Allergen Reactions  . Budesonide-Formoterol Fumarate Swelling    Family History  Problem Relation Age of Onset  . Cancer Mother   . Hypertension Mother   . Diabetes Mother   . Asthma Son     Prior to Admission medications   Medication Sig Start Date End Date Taking? Authorizing Provider  albuterol (PROVENTIL HFA;VENTOLIN HFA) 108 (90 BASE) MCG/ACT inhaler Inhale 2  puffs into the lungs every 4 (four) hours as needed for wheezing or shortness of breath.   Yes Historical Provider, MD  albuterol (PROVENTIL) (2.5 MG/3ML) 0.083% nebulizer solution Take 3 mLs (2.5 mg total) by nebulization every 4 (four) hours as needed for wheezing or shortness of breath. 12/23/14  Yes Loleta Roseory Forbach, MD  amLODipine (NORVASC) 5 MG tablet Take 5 mg by mouth daily.   Yes Historical Provider, MD  azithromycin (ZITHROMAX) 250 MG tablet Take 250 mg by mouth daily.   Yes Historical Provider, MD  carvedilol (COREG) 12.5 MG tablet Take 1 tablet (12.5 mg total) by mouth 2 (two) times daily with a meal. 08/13/14  Yes Sital Mody, MD  enalapril (VASOTEC) 20 MG tablet Take 20 mg by mouth 2 (two) times daily.    Yes Historical Provider, MD  famotidine (PEPCID) 20 MG tablet Take 20 mg by mouth 2 (two) times daily.   Yes Historical Provider, MD  fluticasone (FLONASE) 50 MCG/ACT nasal spray Place 1 spray into both nostrils 2 (two) times daily.    Yes Historical Provider, MD  Fluticasone-Salmeterol (ADVAIR) 500-50 MCG/DOSE AEPB Inhale 1 puff into the lungs 2 (two) times daily.   Yes Historical Provider, MD  hydrochlorothiazide (HYDRODIURIL) 25 MG tablet Take 25 mg by mouth daily.   Yes Historical Provider, MD  ipratropium (ATROVENT) 0.02 % nebulizer solution Take 0.5 mg by nebulization 4 (four) times daily as needed for wheezing or shortness of breath.    Yes  Historical Provider, MD  ipratropium-albuterol (DUONEB) 0.5-2.5 (3) MG/3ML SOLN Inhale 3 mLs into the lungs every 4 (four) hours as needed (wheezing/ shortness of breath).  01/05/15  Yes Historical Provider, MD  loratadine (CLARITIN) 10 MG tablet Take 10 mg by mouth daily.   Yes Historical Provider, MD  LORazepam (ATIVAN) 0.5 MG tablet Take 1 tablet (0.5 mg total) by mouth every 8 (eight) hours as needed for anxiety. 01/03/15  Yes Sital Mody, MD  montelukast (SINGULAIR) 10 MG tablet Take 10 mg by mouth every evening.   Yes Historical Provider, MD   nitroGLYCERIN (NITROSTAT) 0.4 MG SL tablet Place 0.4 mg under the tongue every 5 (five) minutes as needed for chest pain.   Yes Historical Provider, MD  roflumilast (DALIRESP) 500 MCG TABS tablet Take 500 mcg by mouth daily.   Yes Historical Provider, MD  tiotropium (SPIRIVA) 18 MCG inhalation capsule Place 18 mcg into inhaler and inhale daily.   Yes Historical Provider, MD   Physical Exam: Filed Vitals:   01/25/15 0730 01/25/15 0840 01/25/15 1157 01/25/15 1157  BP: 126/76   145/75  Pulse: 109   99  Resp: 20   20  Height:      Weight: 77.474 kg (170 lb 12.8 oz)     SpO2: 99% 99% 99% 94%    Wt Readings from Last 3 Encounters:  01/25/15 77.474 kg (170 lb 12.8 oz)  01/02/15 78.926 kg (174 lb)  12/23/14 75.751 kg (167 lb)    General:  Appears calm and comfortable Eyes: PERRL, normal lids, irises & conjunctiva ENT: grossly normal hearing, lips & tongue Neck: no LAD, masses or thyromegaly Cardiovascular: RRR, no m/r/g. No LE edema. Respiratory: diminished but CTA bilaterally, no w/r/r. Normal respiratory effort. Abdomen: soft, nontender Skin: no rash or induration seen on limited exam Musculoskeletal: grossly normal tone BUE/BLE Psychiatric: grossly normal mood and affect Neurologic: grossly non-focal.          Labs on Admission:  Basic Metabolic Panel:  Recent Labs Lab 01/25/15 0309  NA 141  K 4.1  CL 104  CO2 28  GLUCOSE 135*  BUN 12  CREATININE 0.76  CALCIUM 9.4   Liver Function Tests: No results for input(s): AST, ALT, ALKPHOS, BILITOT, PROT, ALBUMIN in the last 168 hours. No results for input(s): LIPASE, AMYLASE in the last 168 hours. No results for input(s): AMMONIA in the last 168 hours. CBC:  Recent Labs Lab 01/25/15 0309  WBC 7.8  HGB 12.9  HCT 38.5  MCV 98.9  PLT 355   Cardiac Enzymes:  Recent Labs Lab 01/25/15 0309  TROPONINI <0.03    BNP (last 3 results)  Recent Labs  09/22/14 2315 12/10/14 1534 01/02/15 1922  BNP 15.0 51.0 28.0     ProBNP (last 3 results) No results for input(s): PROBNP in the last 8760 hours.  CBG: No results for input(s): GLUCAP in the last 168 hours.  Radiological Exams on Admission: Dg Chest Port 1 View  01/25/2015  CLINICAL DATA:  Acute onset of wheezing and shortness of breath. Difficulty breathing. Initial encounter. EXAM: PORTABLE CHEST 1 VIEW COMPARISON:  Chest radiograph performed 01/02/2015 FINDINGS: The lungs are well-aerated. Minimal bibasilar atelectasis is noted. There is no evidence of pleural effusion or pneumothorax. Bilateral nipple shadows are noted. The cardiomediastinal silhouette is within normal limits. No acute osseous abnormalities are seen. IMPRESSION: Minimal bibasilar atelectasis noted.  Lungs otherwise clear. Electronically Signed   By: Roanna Raider M.D.   On: 01/25/2015 03:07  EKG: Independently reviewed.  Assessment/Plan Principal Problem:   Acute respiratory failure with hypoxemia (HCC) Active Problems:   COPD exacerbation (HCC)   HTN (hypertension)   GERD (gastroesophageal reflux disease)   CHF (congestive heart failure) (HCC)   Anxiety   COPD with acute exacerbation (HCC)   1. Acute on Chronic Respiratory failure with hypoxia -I explained to her that she is not quite ready to be discharged at this time -she might benefit from BIPAP at home but does not seem to be interested at this time -would continue with oxygen at the present level -would need follow up in the office if she does decide to leave  2. COPD with exacerbation -would place on oral steroids -oral antibiotics -continue with inhalers as prescribed and continue with oxygen therapy  Code Status: full code Family Communication: none (indicate person spoken with, if applicable, with phone number if by telephone) Disposition Plan: home      I have personally obtained a history, examined the patient, evaluated laboratory and imaging results, formulated the assessment and plan and  placed orders.  The Patient requires high complexity decision making for assessment and support.    Yevonne Pax, MD [facility] Pulmonary Critical Care Medicine Sleep Medicine

## 2015-01-25 NOTE — ED Provider Notes (Signed)
[facility] Emergency Department Provider Note  ____________________________________________  Time seen: 2:20 AM  I have reviewed the triage vital signs and the nursing notes. History Limited secondary to respiratory distress History obtained from EMS personnel secondary to respiratory distress HISTORY  Chief Complaint Respiratory Distress     HPI Maryruth BunSylvia L Ramnath is a 59 y.o. female presents with progressive respiratory distress with onset at 11:00 PM last night. Patient admits to c cough and shortness of breath with onset 11:00 pm. Patient denies any fever no chest pain.    Past Medical History  Diagnosis Date  . COPD (chronic obstructive pulmonary disease) (HCC)   . CHF (congestive heart failure) (HCC)   . HTN (hypertension)   . GERD (gastroesophageal reflux disease)   . Depression   . Anxiety     Patient Active Problem List   Diagnosis Date Noted  . Acute respiratory failure (HCC) 12/10/2014  . Acute respiratory failure with hypoxemia (HCC) 10/07/2014  . Anxiety 10/07/2014  . HTN (hypertension) 09/23/2014  . GERD (gastroesophageal reflux disease) 09/23/2014  . CHF (congestive heart failure) (HCC) 09/23/2014  . COPD exacerbation (HCC) 08/11/2014    Past Surgical History  Procedure Laterality Date  . Tubal ligation Bilateral     Current Outpatient Rx  Name  Route  Sig  Dispense  Refill  . albuterol (PROVENTIL HFA;VENTOLIN HFA) 108 (90 BASE) MCG/ACT inhaler   Inhalation   Inhale 2 puffs into the lungs every 4 (four) hours as needed for wheezing or shortness of breath.         Marland Kitchen. albuterol (PROVENTIL) (2.5 MG/3ML) 0.083% nebulizer solution   Nebulization   Take 3 mLs (2.5 mg total) by nebulization every 4 (four) hours as needed for wheezing or shortness of breath.   75 mL   2   . amLODipine (NORVASC) 5 MG tablet   Oral   Take 5 mg by mouth daily.         Marland Kitchen. azithromycin (ZITHROMAX) 250 MG tablet   Oral   Take 250 mg by mouth  daily.         . carvedilol (COREG) 12.5 MG tablet   Oral   Take 1 tablet (12.5 mg total) by mouth 2 (two) times daily with a meal.   30 tablet   0   . citalopram (CELEXA) 10 MG tablet   Oral   Take 10 mg by mouth daily.         . enalapril (VASOTEC) 20 MG tablet   Oral   Take 20 mg by mouth 2 (two) times daily.          . famotidine (PEPCID) 20 MG tablet   Oral   Take 20 mg by mouth 2 (two) times daily.         . fluticasone (FLONASE) 50 MCG/ACT nasal spray   Each Nare   Place 1 spray into both nostrils daily.         . Fluticasone-Salmeterol (ADVAIR) 500-50 MCG/DOSE AEPB   Inhalation   Inhale 1 puff into the lungs 2 (two) times daily.         . hydrochlorothiazide (HYDRODIURIL) 25 MG tablet   Oral   Take 25 mg by mouth daily.         Marland Kitchen. ipratropium (ATROVENT) 0.02 % nebulizer solution   Nebulization   Take 0.5 mg by nebulization 4 (four) times daily as needed for wheezing or shortness of breath.          .Marland Kitchen  loratadine (CLARITIN) 10 MG tablet   Oral   Take 10 mg by mouth daily.         Marland Kitchen LORazepam (ATIVAN) 0.5 MG tablet   Oral   Take 1 tablet (0.5 mg total) by mouth every 8 (eight) hours as needed for anxiety.   10 tablet   0   . LORazepam (ATIVAN) 0.5 MG tablet   Oral   Take 1 tablet (0.5 mg total) by mouth every 8 (eight) hours as needed for anxiety.   30 tablet   0   . montelukast (SINGULAIR) 10 MG tablet   Oral   Take 10 mg by mouth every evening.         . nitroGLYCERIN (NITROSTAT) 0.4 MG SL tablet   Sublingual   Place 0.4 mg under the tongue every 5 (five) minutes as needed for chest pain.         . predniSONE (DELTASONE) 10 MG tablet   Oral   Take 0.5 tablets (5 mg total) by mouth daily with breakfast.   42 tablet   0     Label  & dispense according to the schedule below: ...   . roflumilast (DALIRESP) 500 MCG TABS tablet   Oral   Take 500 mcg by mouth daily.         Marland Kitchen tiotropium (SPIRIVA) 18 MCG inhalation  capsule   Inhalation   Place 18 mcg into inhaler and inhale daily.           Allergies Budesonide-formoterol fumarate  Family History  Problem Relation Age of Onset  . Cancer Mother   . Hypertension Mother   . Diabetes Mother   . Asthma Son     Social History Social History  Substance Use Topics  . Smoking status: Former Smoker -- 2.00 packs/day for 35 years    Types: Cigarettes  . Smokeless tobacco: None  . Alcohol Use: Yes    Review of Systems  Constitutional: Negative for fever. Eyes: Negative for visual changes. ENT: Negative for sore throat. Cardiovascular: Negative for chest pain. Respiratory: Positive for shortness of breath, cough  Gastrointestinal: Negative for abdominal pain, vomiting and diarrhea. Genitourinary: Negative for dysuria. Musculoskeletal: Negative for back pain. Skin: Negative for rash. Neurological: Negative for headaches, focal weakness or numbness.   10-point ROS otherwise negative.  ____________________________________________   PHYSICAL EXAM:  VITAL SIGNS: ED Triage Vitals  Enc Vitals Group     BP 01/25/15 0301 134/97 mmHg     Pulse Rate 01/25/15 0216 140     Resp 01/25/15 0216 26     Temp --      Temp src --      SpO2 01/25/15 0216 95 %     Weight 01/25/15 0216 174 lb (78.926 kg)     Height --      Head Cir --      Peak Flow --      Pain Score 01/25/15 0218 3     Pain Loc --      Pain Edu? --      Excl. in GC? --     Constitutional: Alert and oriented. Apparent respiratory distress Eyes: Conjunctivae are normal. PERRL. Normal extraocular movements. ENT   Head: Normocephalic and atraumatic.   Nose: No congestion/rhinnorhea.   Mouth/Throat: Mucous membranes are moist.   Neck: No stridor. Hematological/Lymphatic/Immunilogical: No cervical lymphadenopathy. Cardiovascular: Normal rate, regular rhythm. Normal and symmetric distal pulses are present in all extremities. No murmurs, rubs, or  gallops. Respiratory:  Tachypnea, accessory muscle use, diffuse coarse wheezes. Speaking 2 word phrases Gastrointestinal: Soft and nontender. No distention. There is no CVA tenderness. Genitourinary: deferred Musculoskeletal: Nontender with normal range of motion in all extremities. No joint effusions.  No lower extremity tenderness nor edema. Neurologic:  Normal speech and language. No gross focal neurologic deficits are appreciated. Speech is normal.  Skin:  Skin is warm, dry and intact. No rash noted. Psychiatric: Mood and affect are normal. Speech and behavior are normal. Patient exhibits appropriate insight and judgment.  ____________________________________________    LABS (pertinent positives/negatives)  Labs Reviewed  BASIC METABOLIC PANEL - Abnormal; Notable for the following:    Glucose, Bld 135 (*)    All other components within normal limits  CBC  TROPONIN I         RADIOLOGY  DG Chest Port 1 View (Final result) Result time: 01/25/15 03:07:58   Final result by Rad Results In Interface (01/25/15 03:07:58)   Narrative:   CLINICAL DATA: Acute onset of wheezing and shortness of breath. Difficulty breathing. Initial encounter.  EXAM: PORTABLE CHEST 1 VIEW  COMPARISON: Chest radiograph performed 01/02/2015  FINDINGS: The lungs are well-aerated. Minimal bibasilar atelectasis is noted. There is no evidence of pleural effusion or pneumothorax. Bilateral nipple shadows are noted.  The cardiomediastinal silhouette is within normal limits. No acute osseous abnormalities are seen.  IMPRESSION: Minimal bibasilar atelectasis noted. Lungs otherwise clear.   Electronically Signed By: Roanna Raider M.D. On: 01/25/2015 03:07        Critical Care performed: CRITICAL CARE Performed by: Bayard Males N   Total critical care time:  Critical care time was exclusive of separately billable procedures and treating other patients.  Critical  care was necessary to treat or prevent imminent or life-threatening deterioration.  Critical care was time spent personally by me on the following activities: development of treatment plan with patient and/or surrogate as well as nursing, discussions with consultants, evaluation of patient's response to treatment, examination of patient, obtaining history from patient or surrogate, ordering and performing treatments and interventions, ordering and review of laboratory studies, ordering and review of radiographic studies, pulse oximetry and re-evaluation of patient's condition.   ____________________________________________   INITIAL IMPRESSION / ASSESSMENT AND PLAN / ED COURSE  Pertinent labs & imaging results that were available during my care of the patient were reviewed by me and considered in my medical decision making (see chart for details).  BiPAP applied to the patient on arrival to the emergency department given rest or distress and hypoxia. Patient received multiple DuoNeb treatments through the BiPAP. Patient was given Solu-Medrol 125 mg by EMS before arrival to the emergency department. Patient discussed with Dr. Anne Hahn hospitalist on call for hospital admission  ____________________________________________   FINAL CLINICAL IMPRESSION(S) / ED DIAGNOSES  Final diagnoses:  Acute respiratory failure with hypoxemia (HCC)      Darci Current, MD 01/26/15 0600

## 2015-01-26 NOTE — Discharge Summary (Signed)
  [facility] [place] [facility]  DISCHARGE SUMMARY   PATIENT NAME: Julie KnockSylvia Foley    MR#:  [id number]  DATE OF BIRTH:  1955/12/20  DATE OF ADMISSION:  01/25/2015 ADMITTING PHYSICIAN: Oralia Manisavid Willis, MD  DATE OF DISCHARGE: 01/24/2015  PRIMARY CARE PHYSICIAN: Pcp Not In System    ADMISSION DIAGNOSIS:  Acute respiratory failure with hypoxemia (HCC) [J96.01]  DISCHARGE DIAGNOSIS:  Principal Problem:   Acute respiratory failure with hypoxemia (HCC) Active Problems:   COPD exacerbation (HCC)   HTN (hypertension)   GERD (gastroesophageal reflux disease)   CHF (congestive heart failure) (HCC)   Anxiety   COPD with acute exacerbation (HCC)   SECONDARY DIAGNOSIS:   Past Medical History  Diagnosis Date  . COPD (chronic obstructive pulmonary disease) (HCC)   . CHF (congestive heart failure) (HCC)   . HTN (hypertension)   . GERD (gastroesophageal reflux disease)   . Depression   . Anxiety     HOSPITAL COURSE:   See last note for hospital course. Unfortunately patient chose to leave AGAINST MEDICAL ADVICE. No changes to medical course from prior note.  DISCHARGE CONDITIONS:   Patient left AGAINST MEDICAL ADVICE. She was not ready for discharge. She was competent and oriented and was able to make her own decisions. I discussed the decision to discharge prior to her leaving and encouraged her to stay. I was unable to give her refills of medications or follow-up appointments due to her decision to leave AMA.  CONSULTS OBTAINED:  Treatment Team:  Yevonne PaxSaadat A Khan, MD  DRUG ALLERGIES:   Allergies  Allergen Reactions  . Budesonide-Formoterol Fumarate Swelling    DISCHARGE MEDICATIONS:   No changes made to home regimen due to leaving AMA  DISCHARGE INSTRUCTIONS:   Patient left AMA. Unable to provide discharge instructions.   CODE STATUS: Full  TOTAL TIME TAKING CARE OF THIS PATIENT: 15 minutes.  Greater than 50% of time spent in care  coordination and counseling.  Elby ShowersWALSH, Aesha Agrawal M.D on 01/26/2015 at 3:45 PM  Between 7am to 6pm - Pager - [phone]  After 6pm go to [email or web address] - password [email or web address]  [facility]  Office  [phone]  CC: Primary care physician; Pcp Not In System

## 2015-02-16 ENCOUNTER — Inpatient Hospital Stay
Admission: EM | Admit: 2015-02-16 | Discharge: 2015-02-17 | DRG: 193 | Disposition: A | Discharge status: Home / Self Care | Payer: Medicaid Other | Attending: Internal Medicine | Admitting: Internal Medicine

## 2015-02-16 ENCOUNTER — Encounter: Payer: Self-pay | Admitting: Emergency Medicine

## 2015-02-16 ENCOUNTER — Emergency Department: Payer: Medicaid Other

## 2015-02-16 DIAGNOSIS — F419 Anxiety disorder, unspecified: Secondary | ICD-10-CM | POA: Diagnosis present

## 2015-02-16 DIAGNOSIS — J9621 Acute and chronic respiratory failure with hypoxia: Secondary | ICD-10-CM | POA: Diagnosis present

## 2015-02-16 DIAGNOSIS — J9601 Acute respiratory failure with hypoxia: Secondary | ICD-10-CM

## 2015-02-16 DIAGNOSIS — Z79899 Other long term (current) drug therapy: Secondary | ICD-10-CM

## 2015-02-16 DIAGNOSIS — I110 Hypertensive heart disease with heart failure: Secondary | ICD-10-CM | POA: Diagnosis present

## 2015-02-16 DIAGNOSIS — K219 Gastro-esophageal reflux disease without esophagitis: Secondary | ICD-10-CM | POA: Diagnosis present

## 2015-02-16 DIAGNOSIS — A419 Sepsis, unspecified organism: Secondary | ICD-10-CM | POA: Diagnosis present

## 2015-02-16 DIAGNOSIS — J189 Pneumonia, unspecified organism: Secondary | ICD-10-CM | POA: Diagnosis present

## 2015-02-16 DIAGNOSIS — J441 Chronic obstructive pulmonary disease with (acute) exacerbation: Secondary | ICD-10-CM | POA: Diagnosis present

## 2015-02-16 DIAGNOSIS — I509 Heart failure, unspecified: Secondary | ICD-10-CM | POA: Diagnosis present

## 2015-02-16 LAB — CBC
HCT: 43.8 % (ref 35.0–47.0)
HEMOGLOBIN: 14.1 g/dL (ref 12.0–16.0)
MCH: 31.8 pg (ref 26.0–34.0)
MCHC: 32.2 g/dL (ref 32.0–36.0)
MCV: 98.6 fL (ref 80.0–100.0)
PLATELETS: 359 10*3/uL (ref 150–440)
RBC: 4.44 MIL/uL (ref 3.80–5.20)
RDW: 12.6 % (ref 11.5–14.5)
WBC: 9.3 10*3/uL (ref 3.6–11.0)

## 2015-02-16 LAB — BASIC METABOLIC PANEL
Anion gap: 7 (ref 5–15)
BUN: 12 mg/dL (ref 6–20)
CALCIUM: 9.6 mg/dL (ref 8.9–10.3)
CO2: 33 mmol/L — AB (ref 22–32)
CREATININE: 0.82 mg/dL (ref 0.44–1.00)
Chloride: 101 mmol/L (ref 101–111)
GFR calc Af Amer: >60 mL/min (ref >60)
GLUCOSE: 122 mg/dL — AB (ref 65–99)
Potassium: 4.5 mmol/L (ref 3.5–5.1)
SODIUM: 141 mmol/L (ref 135–145)

## 2015-02-16 LAB — TROPONIN I

## 2015-02-16 MED ORDER — LORAZEPAM 2 MG/ML IJ SOLN
1.0000 mg | Freq: Once | INTRAMUSCULAR | Status: AC
  Administered 2015-02-16: 1 mg via INTRAVENOUS
  Filled 2015-02-16: qty 1

## 2015-02-16 MED ORDER — ONDANSETRON HCL 4 MG PO TABS: 4.0000 mg | ORAL_TABLET | Freq: Four times a day (QID) | ORAL | Status: DC | PRN

## 2015-02-16 MED ORDER — AZITHROMYCIN 250 MG PO TABS
250.0000 mg | ORAL_TABLET | Freq: Every day | ORAL | Status: DC
  Administered 2015-02-16 – 2015-02-17 (×2): 250 mg via ORAL
  Filled 2015-02-16 (×2): qty 1

## 2015-02-16 MED ORDER — PIPERACILLIN-TAZOBACTAM 3.375 G IVPB
3.3750 g | Freq: Once | INTRAVENOUS | Status: AC
  Administered 2015-02-16: 3.375 g via INTRAVENOUS
  Filled 2015-02-16: qty 50

## 2015-02-16 MED ORDER — ALBUTEROL SULFATE (2.5 MG/3ML) 0.083% IN NEBU
2.5000 mg | INHALATION_SOLUTION | RESPIRATORY_TRACT | Status: DC | PRN
  Administered 2015-02-16 – 2015-02-17 (×5): 2.5 mg via RESPIRATORY_TRACT
  Filled 2015-02-16 (×5): qty 3

## 2015-02-16 MED ORDER — NITROGLYCERIN 0.4 MG SL SUBL: 0.4000 mg | SUBLINGUAL_TABLET | SUBLINGUAL | Status: DC | PRN

## 2015-02-16 MED ORDER — DOCUSATE SODIUM 100 MG PO CAPS
100.0000 mg | ORAL_CAPSULE | Freq: Two times a day (BID) | ORAL | Status: DC
  Administered 2015-02-16 (×2): 100 mg via ORAL
  Filled 2015-02-16 (×3): qty 1

## 2015-02-16 MED ORDER — FAMOTIDINE 20 MG PO TABS
20.0000 mg | ORAL_TABLET | Freq: Two times a day (BID) | ORAL | Status: DC
  Administered 2015-02-16 – 2015-02-17 (×3): 20 mg via ORAL
  Filled 2015-02-16 (×3): qty 1

## 2015-02-16 MED ORDER — ALBUTEROL SULFATE HFA 108 (90 BASE) MCG/ACT IN AERS: 2.0000 | INHALATION_SPRAY | RESPIRATORY_TRACT | Status: DC | PRN

## 2015-02-16 MED ORDER — MORPHINE SULFATE (PF) 2 MG/ML IV SOLN
1.0000 mg | INTRAVENOUS | Status: DC | PRN
  Administered 2015-02-16: 1 mg via INTRAVENOUS
  Filled 2015-02-16: qty 1

## 2015-02-16 MED ORDER — AMLODIPINE BESYLATE 5 MG PO TABS
5.0000 mg | ORAL_TABLET | Freq: Every day | ORAL | Status: DC
  Administered 2015-02-16: 5 mg via ORAL
  Filled 2015-02-16 (×4): qty 1

## 2015-02-16 MED ORDER — MOMETASONE FURO-FORMOTEROL FUM 200-5 MCG/ACT IN AERO
2.0000 | INHALATION_SPRAY | Freq: Two times a day (BID) | RESPIRATORY_TRACT | Status: DC
  Administered 2015-02-16 – 2015-02-17 (×3): 2 via RESPIRATORY_TRACT
  Filled 2015-02-16: qty 8.8

## 2015-02-16 MED ORDER — FLUTICASONE PROPIONATE 50 MCG/ACT NA SUSP
1.0000 | Freq: Two times a day (BID) | NASAL | Status: DC
  Administered 2015-02-16 – 2015-02-17 (×3): 1 via NASAL
  Filled 2015-02-16: qty 16

## 2015-02-16 MED ORDER — MONTELUKAST SODIUM 10 MG PO TABS
10.0000 mg | ORAL_TABLET | Freq: Every evening | ORAL | Status: DC
  Administered 2015-02-16: 10 mg via ORAL
  Filled 2015-02-16: qty 1

## 2015-02-16 MED ORDER — LORAZEPAM 0.5 MG PO TABS
0.5000 mg | ORAL_TABLET | Freq: Three times a day (TID) | ORAL | Status: DC | PRN
  Administered 2015-02-16 (×2): 0.5 mg via ORAL
  Filled 2015-02-16 (×3): qty 1

## 2015-02-16 MED ORDER — TIOTROPIUM BROMIDE MONOHYDRATE 18 MCG IN CAPS
18.0000 ug | ORAL_CAPSULE | Freq: Every day | RESPIRATORY_TRACT | Status: DC
  Administered 2015-02-16 – 2015-02-17 (×2): 18 ug via RESPIRATORY_TRACT
  Filled 2015-02-16: qty 5

## 2015-02-16 MED ORDER — VANCOMYCIN HCL IN DEXTROSE 750-5 MG/150ML-% IV SOLN
750.0000 mg | Freq: Two times a day (BID) | INTRAVENOUS | Status: DC
  Administered 2015-02-16: 750 mg via INTRAVENOUS
  Filled 2015-02-16 (×2): qty 150

## 2015-02-16 MED ORDER — ENALAPRIL MALEATE 10 MG PO TABS
20.0000 mg | ORAL_TABLET | Freq: Two times a day (BID) | ORAL | Status: DC
  Administered 2015-02-16: 20 mg via ORAL
  Filled 2015-02-16 (×3): qty 2

## 2015-02-16 MED ORDER — ACETAMINOPHEN 650 MG RE SUPP: 650.0000 mg | Freq: Four times a day (QID) | RECTAL | Status: DC | PRN

## 2015-02-16 MED ORDER — ALBUTEROL SULFATE (2.5 MG/3ML) 0.083% IN NEBU
INHALATION_SOLUTION | RESPIRATORY_TRACT | Status: AC
  Filled 2015-02-16: qty 12

## 2015-02-16 MED ORDER — ROFLUMILAST 500 MCG PO TABS
500.0000 ug | ORAL_TABLET | Freq: Every day | ORAL | Status: DC
  Administered 2015-02-16 – 2015-02-17 (×2): 500 ug via ORAL
  Filled 2015-02-16 (×2): qty 1

## 2015-02-16 MED ORDER — PIPERACILLIN-TAZOBACTAM 3.375 G IVPB
3.3750 g | Freq: Three times a day (TID) | INTRAVENOUS | Status: DC
  Administered 2015-02-16 – 2015-02-17 (×3): 3.375 g via INTRAVENOUS
  Filled 2015-02-16 (×5): qty 50

## 2015-02-16 MED ORDER — HYDROCHLOROTHIAZIDE 25 MG PO TABS
25.0000 mg | ORAL_TABLET | Freq: Every day | ORAL | Status: DC
  Administered 2015-02-16: 25 mg via ORAL
  Filled 2015-02-16 (×2): qty 1

## 2015-02-16 MED ORDER — PIPERACILLIN-TAZOBACTAM 4.5 G IVPB
4.5000 g | Freq: Three times a day (TID) | INTRAVENOUS | Status: DC
  Administered 2015-02-16: 4.5 g via INTRAVENOUS
  Filled 2015-02-16 (×4): qty 100

## 2015-02-16 MED ORDER — VANCOMYCIN HCL IN DEXTROSE 1-5 GM/200ML-% IV SOLN
1000.0000 mg | Freq: Once | INTRAVENOUS | Status: AC
  Administered 2015-02-16: 1000 mg via INTRAVENOUS
  Filled 2015-02-16: qty 200

## 2015-02-16 MED ORDER — ALBUTEROL SULFATE (2.5 MG/3ML) 0.083% IN NEBU
10.0000 mg | INHALATION_SOLUTION | RESPIRATORY_TRACT | Status: AC
  Administered 2015-02-16: 10 mg via RESPIRATORY_TRACT

## 2015-02-16 MED ORDER — CARVEDILOL 12.5 MG PO TABS
12.5000 mg | ORAL_TABLET | Freq: Two times a day (BID) | ORAL | Status: DC
  Administered 2015-02-16 (×2): 12.5 mg via ORAL
  Filled 2015-02-16 (×2): qty 1

## 2015-02-16 MED ORDER — ENOXAPARIN SODIUM 40 MG/0.4ML ~~LOC~~ SOLN
40.0000 mg | SUBCUTANEOUS | Status: DC
  Filled 2015-02-16: qty 0.4

## 2015-02-16 MED ORDER — ACETAMINOPHEN 325 MG PO TABS: 650.0000 mg | ORAL_TABLET | Freq: Four times a day (QID) | ORAL | Status: DC | PRN

## 2015-02-16 MED ORDER — PREDNISONE 50 MG PO TABS
50.0000 mg | ORAL_TABLET | Freq: Every day | ORAL | Status: DC
Start: 2015-02-16 — End: 2015-02-17
  Administered 2015-02-16 – 2015-02-17 (×2): 50 mg via ORAL
  Filled 2015-02-16 (×2): qty 1

## 2015-02-16 MED ORDER — SODIUM CHLORIDE 0.9 % IJ SOLN
3.0000 mL | Freq: Two times a day (BID) | INTRAMUSCULAR | Status: DC
  Administered 2015-02-16 – 2015-02-17 (×3): 3 mL via INTRAVENOUS

## 2015-02-16 MED ORDER — LORATADINE 10 MG PO TABS
10.0000 mg | ORAL_TABLET | Freq: Every day | ORAL | Status: DC
  Administered 2015-02-16 – 2015-02-17 (×2): 10 mg via ORAL
  Filled 2015-02-16 (×2): qty 1

## 2015-02-16 MED ORDER — IPRATROPIUM BROMIDE 0.02 % IN SOLN
0.5000 mg | Freq: Four times a day (QID) | RESPIRATORY_TRACT | Status: DC | PRN
  Administered 2015-02-16 – 2015-02-17 (×4): 0.5 mg via RESPIRATORY_TRACT
  Filled 2015-02-16 (×4): qty 2.5

## 2015-02-16 MED ORDER — ONDANSETRON HCL 4 MG/2ML IJ SOLN
4.0000 mg | Freq: Four times a day (QID) | INTRAMUSCULAR | Status: DC | PRN
  Administered 2015-02-16: 4 mg via INTRAVENOUS
  Filled 2015-02-16: qty 2

## 2015-02-16 NOTE — Care Management (Signed)
Spoke with Julie Foley at [facility] who will call patient to discuss PACE program

## 2015-02-16 NOTE — Progress Notes (Signed)
Removed pt from Bipap for a trial on [place]. Placed pt. on 4L [place], SPO2 99%, HR 119, RR 16. Pt states she feels fine and is ready to go home. Bipap still at bedside. MD and RN aware.

## 2015-02-16 NOTE — Progress Notes (Signed)
Initial Nutrition Assessment     INTERVENTION:  Meals and snacks: Cater to pt preferences, small frequent meals may work for pt secondary to breathing issues.   NUTRITION DIAGNOSIS:    (none at this time) related to   as evidenced by  .    GOAL:   Patient will meet greater than or equal to 90% of their needs    MONITOR:    (Energy intake, Pulmonary profile)  REASON FOR ASSESSMENT:   Consult COPD Protocol  ASSESSMENT:      Pt admitted with shortness of breath, pneumonia, sepsis  Past Medical History  Diagnosis Date  . COPD (chronic obstructive pulmonary disease) (HCC)   . CHF (congestive heart failure) (HCC)   . HTN (hypertension)   . GERD (gastroesophageal reflux disease)   . Depression   . Anxiety     Current Nutrition: eating well per pt, wanting to go home  Food/Nutrition-Related History: eating well prior to admission   Scheduled Medications:  . amLODipine  5 mg Oral Daily  . azithromycin  250 mg Oral Daily  . carvedilol  12.5 mg Oral BID WC  . docusate sodium  100 mg Oral BID  . enalapril  20 mg Oral BID  . enoxaparin (LOVENOX) injection  40 mg Subcutaneous Q24H  . famotidine  20 mg Oral BID  . fluticasone  1 spray Each Nare BID  . hydrochlorothiazide  25 mg Oral Daily  . loratadine  10 mg Oral Daily  . mometasone-formoterol  2 puff Inhalation BID  . montelukast  10 mg Oral QPM  . piperacillin-tazobactam (ZOSYN)  IV  3.375 g Intravenous 3 times per day  . predniSONE  50 mg Oral Q breakfast  . roflumilast  500 mcg Oral Daily  . sodium chloride  3 mL Intravenous Q12H  . tiotropium  18 mcg Inhalation Daily    Electrolyte/Renal Profile and Glucose Profile:   Recent Labs Lab 02/16/15 0035  NA 141  K 4.5  CL 101  CO2 33*  BUN 12  CREATININE 0.82  CALCIUM 9.6  GLUCOSE 122*    Gastrointestinal Profile: Last BM: WDL  Weight Change: stable wt    Diet Order:  Diet regular Room service appropriate?: Yes; Fluid consistency::  Thin  Skin:   reviewed   Height:   Ht Readings from Last 1 Encounters:  02/16/15 5\' 2"  (1.575 m)    Weight:   Wt Readings from Last 1 Encounters:  02/16/15 165 lb (74.844 kg)    Ideal Body Weight:     BMI:  Body mass index is 30.17 kg/(m^2).   EDUCATION NEEDS:   No education needs identified at this time  LOW Care Level  Kabria Hetzer B. Freida BusmanAllen, RD, LDN [phone])

## 2015-02-16 NOTE — Care Management (Signed)
Patient was open to [facility] prior to admission. Asked MD for COPD gold protocol. Patient would benefit for HRI. More to follow.

## 2015-02-16 NOTE — Care Management (Signed)
[facility] was [facility] to admission. Would benefit from COPD Gold program and [facility]. Case discussed with Dr Imogene Burnhen Attending.

## 2015-02-16 NOTE — ED Provider Notes (Signed)
[facility] Emergency Department Provider Note  ____________________________________________  Time seen: 12:20 AM  I have reviewed the triage vital signs and the nursing notes.   HISTORY  Chief Complaint Respiratory Distress      HPI Julie Foley is a 59 y.o. female   presents with respiratory distress via EMS. Per EMS they responded to the patient was addressed twice today for rest or distress however the patient refused to come the first time. On this arrival EMS stated that the patient's oxygen level was 83% on room air with considerable work of breathing as such CPAP was applied. Patient presents to the emergency department a tripod position with apparent respiratory distress and accessory muscle use. Patient states that she's been sick for the past "couple days". Patient denies any fever     Past Medical History  Diagnosis Date  . COPD (chronic obstructive pulmonary disease) (HCC)   . CHF (congestive heart failure) (HCC)   . HTN (hypertension)   . GERD (gastroesophageal reflux disease)   . Depression   . Anxiety     Patient Active Problem List   Diagnosis Date Noted  . COPD with acute exacerbation (HCC) 01/25/2015  . Acute respiratory failure with hypoxemia (HCC) 10/07/2014  . Anxiety 10/07/2014  . HTN (hypertension) 09/23/2014  . GERD (gastroesophageal reflux disease) 09/23/2014  . CHF (congestive heart failure) (HCC) 09/23/2014  . COPD exacerbation (HCC) 08/11/2014    Past Surgical History  Procedure Laterality Date  . Tubal ligation Bilateral     Current Outpatient Rx  Name  Route  Sig  Dispense  Refill  . albuterol (PROVENTIL HFA;VENTOLIN HFA) 108 (90 BASE) MCG/ACT inhaler   Inhalation   Inhale 2 puffs into the lungs every 4 (four) hours as needed for wheezing or shortness of breath.         Marland Kitchen albuterol (PROVENTIL) (2.5 MG/3ML) 0.083% nebulizer solution   Nebulization   Take 3 mLs (2.5 mg total) by nebulization every 4  (four) hours as needed for wheezing or shortness of breath.   75 mL   2   . amLODipine (NORVASC) 5 MG tablet   Oral   Take 5 mg by mouth daily.         Marland Kitchen azithromycin (ZITHROMAX) 250 MG tablet   Oral   Take 250 mg by mouth daily.         . carvedilol (COREG) 12.5 MG tablet   Oral   Take 1 tablet (12.5 mg total) by mouth 2 (two) times daily with a meal.   30 tablet   0   . enalapril (VASOTEC) 20 MG tablet   Oral   Take 20 mg by mouth 2 (two) times daily.          . famotidine (PEPCID) 20 MG tablet   Oral   Take 20 mg by mouth 2 (two) times daily.         . fluticasone (FLONASE) 50 MCG/ACT nasal spray   Each Nare   Place 1 spray into both nostrils 2 (two) times daily.          . Fluticasone-Salmeterol (ADVAIR) 500-50 MCG/DOSE AEPB   Inhalation   Inhale 1 puff into the lungs 2 (two) times daily.         . hydrochlorothiazide (HYDRODIURIL) 25 MG tablet   Oral   Take 25 mg by mouth daily.         Marland Kitchen ipratropium (ATROVENT) 0.02 % nebulizer solution   Nebulization  Take 0.5 mg by nebulization 4 (four) times daily as needed for wheezing or shortness of breath.          Marland Kitchen ipratropium-albuterol (DUONEB) 0.5-2.5 (3) MG/3ML SOLN   Inhalation   Inhale 3 mLs into the lungs every 4 (four) hours as needed (wheezing/ shortness of breath).          . loratadine (CLARITIN) 10 MG tablet   Oral   Take 10 mg by mouth daily.         Marland Kitchen LORazepam (ATIVAN) 0.5 MG tablet   Oral   Take 1 tablet (0.5 mg total) by mouth every 8 (eight) hours as needed for anxiety.   30 tablet   0   . montelukast (SINGULAIR) 10 MG tablet   Oral   Take 10 mg by mouth every evening.         . nitroGLYCERIN (NITROSTAT) 0.4 MG SL tablet   Sublingual   Place 0.4 mg under the tongue every 5 (five) minutes as needed for chest pain.         . roflumilast (DALIRESP) 500 MCG TABS tablet   Oral   Take 500 mcg by mouth daily.         Marland Kitchen tiotropium (SPIRIVA) 18 MCG inhalation  capsule   Inhalation   Place 18 mcg into inhaler and inhale daily.           Allergies Budesonide-formoterol fumarate  Family History  Problem Relation Age of Onset  . Cancer Mother   . Hypertension Mother   . Diabetes Mother   . Asthma Son     Social History Social History  Substance Use Topics  . Smoking status: Former Smoker -- 2.00 packs/day for 35 years    Types: Cigarettes  . Smokeless tobacco: None  . Alcohol Use: Yes    Review of Systems  Constitutional: Negative for fever. Eyes: Negative for visual changes. ENT: Negative for sore throat. Cardiovascular: Negative for chest pain. Respiratory: Positive for shortness of breath and cough Gastrointestinal: Negative for abdominal pain, vomiting and diarrhea. Genitourinary: Negative for dysuria. Musculoskeletal: Negative for back pain. Skin: Negative for rash. Neurological: Negative for headaches, focal weakness or numbness.   10-point ROS otherwise negative.  ____________________________________________   PHYSICAL EXAM:  VITAL SIGNS: ED Triage Vitals  Enc Vitals Group     BP 02/16/15 0030 160/48 mmHg     Pulse Rate 02/16/15 0030 148     Resp 02/16/15 0030 27     Temp 02/16/15 0030 97.8 F (36.6 C)     Temp Source 02/16/15 0030 Axillary     SpO2 02/16/15 0025 83 %     Weight 02/16/15 0030 175 lb (79.379 kg)     Height 02/16/15 0030 5\' 2"  (1.575 m)     Head Cir --      Peak Flow --      Pain Score 02/16/15 0031 8     Pain Loc --      Pain Edu? --      Excl. in GC? --      Constitutional: Alert and oriented. Well appearing and in no distress. Eyes: Conjunctivae are normal. PERRL. Normal extraocular movements. ENT   Head: Normocephalic and atraumatic.   Nose: No congestion/rhinnorhea.   Mouth/Throat: Mucous membranes are moist.   Neck: No stridor. Hematological/Lymphatic/Immunilogical: No cervical lymphadenopathy. Cardiovascular: Tachycardia, regular rhythm. Normal and symmetric  distal pulses are present in all extremities. No murmurs, rubs, or gallops. Respiratory: Tachypnea and accessory respiratory muscle  use tripod hypoxic. Gastrointestinal: Soft and nontender. No distention. There is no CVA tenderness. Genitourinary: deferred Musculoskeletal: Nontender with normal range of motion in all extremities. No joint effusions.  No lower extremity tenderness nor edema. Neurologic:  Normal speech and language. No gross focal neurologic deficits are appreciated. Speech is normal.  Skin:  Skin is warm, dry and intact. No rash noted. Psychiatric: Mood and affect are normal. Speech and behavior are normal. Patient exhibits appropriate insight and judgment.  ____________________________________________    LABS (pertinent positives/negatives)  Labs Reviewed  BASIC METABOLIC PANEL - Abnormal; Notable for the following:    CO2 33 (*)    Glucose, Bld 122 (*)    All other components within normal limits  CBC  TROPONIN I     ____________________________________________   EKG  ED ECG REPORT I, Suella Cogar, [place] N, the attending physician, personally viewed and interpreted this ECG.   Date: 02/16/2015  EKG Time: 12:36 AM  Rate: 147   Rhythm: Sinus tachycardia  Axis: None  Intervals: Prolonged QTC 517  ST&T Change: Lateral ST segment depression V5 V6 1 aVL   ____________________________________________    RADIOLOGY   DG Chest Port 1 View (Final result) Result time: 02/16/15 00:49:43   Final result by Rad Results In Interface (02/16/15 00:49:43)   Narrative:   CLINICAL DATA: Acute onset of shortness of breath. Initial encounter.  EXAM: PORTABLE CHEST 1 VIEW  COMPARISON: Chest radiograph performed 01/25/2015  FINDINGS: Right middle lobe airspace opacification is compatible with pneumonia. The left lung appears relatively clear. No pleural effusion or pneumothorax is seen.  The cardiomediastinal silhouette is normal in size. No acute  osseous abnormalities are identified.  IMPRESSION: Right middle lobe pneumonia noted. Followup PA and lateral chest X-ray is recommended in 3-4 weeks following trial of antibiotic therapy to ensure resolution and exclude underlying malignancy.   Electronically Signed By: Roanna RaiderJeffery Chang M.D. On: 02/16/2015 00:49          Critical Care performed: CRITICAL CARE Performed by: Bayard MalesBROWN, [place] N   Total critical care time: 60 minutes  Critical care time was exclusive of separately billable procedures and treating other patients.  Critical care was necessary to treat or prevent imminent or life-threatening deterioration.  Critical care was time spent personally by me on the following activities: development of treatment plan with patient and/or surrogate as well as nursing, discussions with consultants, evaluation of patient's response to treatment, examination of patient, obtaining history from patient or surrogate, ordering and performing treatments and interventions, ordering and review of laboratory studies, ordering and review of radiographic studies, pulse oximetry and re-evaluation of patient's condition.  ____________________________________________   INITIAL IMPRESSION / ASSESSMENT AND PLAN / ED COURSE  Pertinent labs & imaging results that were available during my care of the patient were reviewed by me and considered in my medical decision making (see chart for details).  Bipap applied to the patient on presentation to the emergency department with continuous nebulized albuterol. Patient received Solu-Medrol and 25 mg and 2 DuoNeb's in route to the emergency department via EMS. Chest x-ray revealed a right middle lobe pneumonia as such patient received vancomycin and Zosyn for hospital car pneumonia given that the patient has had 9 emergency department visits in the last 6 months.  ----------------------------------------- 2:50 AM on  02/16/2015 -----------------------------------------  Patient stating that she is feeling better and requesting to be discharged. I advised the patient at length against this decision informing her of the possibility of worsening symptoms and potentially  death. ____________________________________________   FINAL CLINICAL IMPRESSION(S) / ED DIAGNOSES  Final diagnoses:  Acute respiratory failure with hypoxemia (HCC)  Healthcare-associated pneumonia      Darci Current, MD 02/16/15 [phone]

## 2015-02-16 NOTE — H&P (Signed)
Julie Foley is an 59 y.o. female.   Chief Complaint: shortness of breath HPI: the patient presents emergency department via EMS complaining of severe shortness of breath. The patient states that she began coughing more than usual and bringing up gray colored phlegm approximately 2 days ago. She denies fevers nausea or vomiting. In the emergency department the patient was initially requiring BiPAP to slow her respiratory rate and improve oxygenation. She was able to be transitioned to nasal cannula with 4 L of oxygen per her usual home requirement. Due to continued tachypnea and tachycardia as well as right middle lobe pneumonia seen on chest x-ray the emergency department staff called for admission.  Past Medical History  Diagnosis Date  . COPD (chronic obstructive pulmonary disease) (HCC)   . CHF (congestive heart failure) (HCC)   . HTN (hypertension)   . GERD (gastroesophageal reflux disease)   . Depression   . Anxiety     Past Surgical History  Procedure Laterality Date  . Tubal ligation Bilateral     Family History  Problem Relation Age of Onset  . Cancer Mother   . Hypertension Mother   . Diabetes Mother   . Asthma Son    Social History:  reports that she has quit smoking. Her smoking use included Cigarettes. She has a 70 pack-year smoking history. She does not have any smokeless tobacco history on file. She reports that she drinks alcohol. She reports that she uses illicit drugs (Marijuana).  Allergies:  Allergies  Allergen Reactions  . Budesonide-Formoterol Fumarate Swelling    Prior to Admission medications   Medication Sig Start Date End Date Taking? Authorizing Provider  albuterol (PROVENTIL HFA;VENTOLIN HFA) 108 (90 BASE) MCG/ACT inhaler Inhale 2 puffs into the lungs every 4 (four) hours as needed for wheezing or shortness of breath.    Historical Provider, MD  albuterol (PROVENTIL) (2.5 MG/3ML) 0.083% nebulizer solution Take 3 mLs (2.5 mg total) by nebulization  every 4 (four) hours as needed for wheezing or shortness of breath. 12/23/14   Cory Forbach, MD  amLODipine (NORVASC) 5 MG tablet Take 5 mg by mouth daily.    Historical Provider, MD  azithromycin (ZITHROMAX) 250 MG tablet Take 250 mg by mouth daily.    Historical Provider, MD  carvedilol (COREG) 12.5 MG tablet Take 1 tablet (12.5 mg total) by mouth 2 (two) times daily with a meal. 08/13/14   Sital Mody, MD  enalapril (VASOTEC) 20 MG tablet Take 20 mg by mouth 2 (two) times daily.     Historical Provider, MD  famotidine (PEPCID) 20 MG tablet Take 20 mg by mouth 2 (two) times daily.    Historical Provider, MD  fluticasone (FLONASE) 50 MCG/ACT nasal spray Place 1 spray into both nostrils 2 (two) times daily.     Historical Provider, MD  Fluticasone-Salmeterol (ADVAIR) 500-50 MCG/DOSE AEPB Inhale 1 puff into the lungs 2 (two) times daily.    Historical Provider, MD  hydrochlorothiazide (HYDRODIURIL) 25 MG tablet Take 25 mg by mouth daily.    Historical Provider, MD  ipratropium (ATROVENT) 0.02 % nebulizer solution Take 0.5 mg by nebulization 4 (four) times daily as needed for wheezing or shortness of breath.     Historical Provider, MD  ipratropium-albuterol (DUONEB) 0.5-2.5 (3) MG/3ML SOLN Inhale 3 mLs into the lungs every 4 (four) hours as needed (wheezing/ shortness of breath).  01/05/15   Historical Provider, MD  loratadine (CLARITIN) 10 MG tablet Take 10 mg by mouth daily.    Historical   Provider, MD  LORazepam (ATIVAN) 0.5 MG tablet Take 1 tablet (0.5 mg total) by mouth every 8 (eight) hours as needed for anxiety. 01/03/15   Sital Mody, MD  montelukast (SINGULAIR) 10 MG tablet Take 10 mg by mouth every evening.    Historical Provider, MD  nitroGLYCERIN (NITROSTAT) 0.4 MG SL tablet Place 0.4 mg under the tongue every 5 (five) minutes as needed for chest pain.    Historical Provider, MD  roflumilast (DALIRESP) 500 MCG TABS tablet Take 500 mcg by mouth daily.    Historical Provider, MD  tiotropium  (SPIRIVA) 18 MCG inhalation capsule Place 18 mcg into inhaler and inhale daily.    Historical Provider, MD     Results for orders placed or performed during the hospital encounter of 02/16/15 (from the past 48 hour(s))  Basic metabolic panel     Status: Abnormal   Collection Time: 02/16/15 12:35 AM  Result Value Ref Range   Sodium 141 135 - 145 mmol/L   Potassium 4.5 3.5 - 5.1 mmol/L   Chloride 101 101 - 111 mmol/L   CO2 33 (H) 22 - 32 mmol/L   Glucose, Bld 122 (H) 65 - 99 mg/dL   BUN 12 6 - 20 mg/dL   Creatinine, Ser 0.82 0.44 - 1.00 mg/dL   Calcium 9.6 8.9 - 10.3 mg/dL   GFR calc non Af Amer >60 >60 mL/min   GFR calc Af Amer >60 >60 mL/min    Comment: (NOTE) The eGFR has been calculated using the CKD EPI equation. This calculation has not been validated in all clinical situations. eGFR's persistently <60 mL/min signify possible Chronic Kidney Disease.    Anion gap 7 5 - 15  CBC     Status: None   Collection Time: 02/16/15 12:35 AM  Result Value Ref Range   WBC 9.3 3.6 - 11.0 K/uL   RBC 4.44 3.80 - 5.20 MIL/uL   Hemoglobin 14.1 12.0 - 16.0 g/dL   HCT 43.8 35.0 - 47.0 %   MCV 98.6 80.0 - 100.0 fL   MCH 31.8 26.0 - 34.0 pg   MCHC 32.2 32.0 - 36.0 g/dL   RDW 12.6 11.5 - 14.5 %   Platelets 359 150 - 440 K/uL  Troponin I     Status: None   Collection Time: 02/16/15 12:35 AM  Result Value Ref Range   Troponin I <0.03 <0.031 ng/mL    Comment:        NO INDICATION OF MYOCARDIAL INJURY.    Dg Chest Port 1 View  02/16/2015  CLINICAL DATA:  Acute onset of shortness of breath. Initial encounter. EXAM: PORTABLE CHEST 1 VIEW COMPARISON:  Chest radiograph performed 01/25/2015 FINDINGS: Right middle lobe airspace opacification is compatible with pneumonia. The left lung appears relatively clear. No pleural effusion or pneumothorax is seen. The cardiomediastinal silhouette is normal in size. No acute osseous abnormalities are identified. IMPRESSION: Right middle lobe pneumonia noted.  Followup PA and lateral chest X-ray is recommended in 3-4 weeks following trial of antibiotic therapy to ensure resolution and exclude underlying malignancy. Electronically Signed   By: Jeffery  Chang M.D.   On: 02/16/2015 00:49    Review of Systems  Constitutional: Negative for fever and chills.  HENT: Negative for sore throat and tinnitus.   Eyes: Negative for blurred vision and redness.  Respiratory: Positive for cough and shortness of breath.   Cardiovascular: Negative for chest pain, palpitations, orthopnea and PND.  Gastrointestinal: Negative for nausea, vomiting, abdominal pain and diarrhea.    Genitourinary: Negative for dysuria, urgency and frequency.  Musculoskeletal: Negative for myalgias and joint pain.  Skin: Negative for rash.       No lesions  Neurological: Negative for speech change, focal weakness and weakness.  Endo/Heme/Allergies: Does not bruise/bleed easily.       No temperature intolerance  Psychiatric/Behavioral: Negative for depression and suicidal ideas.    Blood pressure 112/78, pulse 117, temperature 97.8 F (36.6 C), temperature source Axillary, resp. rate 15, height 5' 2" (1.575 m), weight 79.379 kg (175 lb), SpO2 97 %. Physical Exam  Nursing note and vitals reviewed. Constitutional: She is oriented to person, place, and time. She appears well-developed and well-nourished. No distress.  HENT:  Head: Normocephalic and atraumatic.  Mouth/Throat: Oropharynx is clear and moist.  Eyes: Conjunctivae and EOM are normal. Pupils are equal, round, and reactive to light. No scleral icterus.  Neck: Normal range of motion. Neck supple. No JVD present. No tracheal deviation present. No thyromegaly present.  Cardiovascular: Normal rate, regular rhythm and normal heart sounds.  Exam reveals no gallop and no friction rub.   No murmur heard. Respiratory: Effort normal. She has decreased breath sounds in the right lower field. She has wheezes (expiratory wheezes throughout).  She has rhonchi in the right middle field. She has no rales.  GI: Soft. Bowel sounds are normal. She exhibits no distension. There is no tenderness.  Genitourinary:  deferred  Musculoskeletal: Normal range of motion. She exhibits no edema.  Lymphadenopathy:    She has no cervical adenopathy.  Neurological: She is alert and oriented to person, place, and time. No cranial nerve deficit. She exhibits normal muscle tone.  Skin: Skin is warm and dry. No rash noted. No erythema.  Psychiatric: She has a normal mood and affect. Her behavior is normal. Judgment and thought content normal.     Assessment/Plan This is a 59-year-old African American female admitted for right middle lobe pneumonia as well as acute on chronic respiratory failure with hypoxemia. 1. Pneumonia: Community-acquired; right middle lobe. The patient is observed brought patient antibiotics. She meets criteria for sepsis without shock. Continue supplemental oxygen as needed. 2. Sepsis: The patient criteria via tachypnea and tachycardia. She is hemodynamically stable. Follow blood cultures for sensitivities. 3. Acute on chronic respiratory failure: Patient states that she has been using her inhalers as directed. She has a history of recurrent exacerbations. I have started the patient on a non-tapering steroid dose. Continue inhalers per home regimen. 4. Hypertension: Continue carvedilol, enalapril and amlodipine 5. DVT prophylaxis: Lovenox 6. GI prophylaxis: H2 blocker per home regimen The patient is a full code. Time spent on admission orders and patient care approximately 45 minutes  ,   S 02/16/2015, 3:52 AM    

## 2015-02-16 NOTE — Care Management (Signed)
[facility] who inforLupita Leashmed me that ateitn does not want to give up her home health aid and [facility].  Patient does not want to sign up for PACE.

## 2015-02-16 NOTE — ED Notes (Signed)
Blood CX drawn prior to ABX #1@0055 , #2@0100 

## 2015-02-16 NOTE — Care Management (Signed)
[facility]. COPD gold program initiated. Patient is from home with husband and  Is on O2 at 4LPM at home provided by [facility]. Patient will need to be [facility] when discharge due to readmissions. Also patient stated the she was interested in applying for PACE program. [facility]

## 2015-02-16 NOTE — Progress Notes (Addendum)
[facility] [place] at [facility]   PATIENT NAME: Julie Foley    MR#:  [id number]  DATE OF BIRTH:  Oct 30, 1955  SUBJECTIVE:  CHIEF COMPLAINT:   Chief Complaint  Patient presents with  . Respiratory Distress   Cough, sputum, shortness of breath and wheezing. But she wanted to go home today. REVIEW OF SYSTEMS:  CONSTITUTIONAL: No fever, fatigue or weakness.  EYES: No blurred or double vision.  EARS, NOSE, AND THROAT: No tinnitus or ear pain.  RESPIRATORY: has cough, shortness of breath and wheezing, no hemoptysis.  CARDIOVASCULAR: No chest pain, orthopnea, edema.  GASTROINTESTINAL: No nausea, vomiting, diarrhea or abdominal pain.  GENITOURINARY: No dysuria, hematuria.  ENDOCRINE: No polyuria, nocturia,  HEMATOLOGY: No anemia, easy bruising or bleeding SKIN: No rash or lesion. MUSCULOSKELETAL: No joint pain or arthritis.   NEUROLOGIC: No tingling, numbness, weakness.  PSYCHIATRY: No anxiety or depression.   DRUG ALLERGIES:   Allergies  Allergen Reactions  . Budesonide-Formoterol Fumarate Swelling    VITALS:  Blood pressure 108/70, pulse 102, temperature 98.3 F (36.8 C), temperature source Oral, resp. rate 18, height  (1.575 m), weight 74.844 kg (165 lb), SpO2 89 %.  PHYSICAL EXAMINATION:  GENERAL:  59 y.o.-year-old patient sitting in the bed with no acute distress.  EYES: Pupils equal, round, reactive to light and accommodation. No scleral icterus. Extraocular muscles intact.  HEENT: Head atraumatic, normocephalic. Oropharynx and nasopharynx clear.  NECK:  Supple, no jugular venous distention. No thyroid enlargement, no tenderness.  LUNGS: Very weak breath sounds bilaterally, has wheezing and rhonchi. No use of accessory muscles of respiration.  CARDIOVASCULAR: S1, S2 normal. No murmurs, rubs, or gallops.  ABDOMEN: Soft, nontender, nondistended. Bowel sounds present. No organomegaly or mass.  EXTREMITIES: No pedal edema, cyanosis, or  clubbing.  NEUROLOGIC: Cranial nerves II through XII are intact. Muscle strength 5/5 in all extremities. Sensation intact. Gait not checked.  PSYCHIATRIC: The patient is alert and oriented x 3.  SKIN: No obvious rash, lesion, or ulcer.    LABORATORY PANEL:   CBC  Recent Labs Lab 02/16/15 0035  WBC 9.3  HGB 14.1  HCT 43.8  PLT 359   ------------------------------------------------------------------------------------------------------------------  Chemistries   Recent Labs Lab 02/16/15 0035  NA 141  K 4.5  CL 101  CO2 33*  GLUCOSE 122*  BUN 12  CREATININE 0.82  CALCIUM 9.6   ------------------------------------------------------------------------------------------------------------------  Cardiac Enzymes  Recent Labs Lab 02/16/15 0035  TROPONINI <0.03   ------------------------------------------------------------------------------------------------------------------  RADIOLOGY:  Dg Chest Port 1 View  02/16/2015  CLINICAL DATA:  Acute onset of shortness of breath. Initial encounter. EXAM: PORTABLE CHEST 1 VIEW COMPARISON:  Chest radiograph performed 01/25/2015 FINDINGS: Right middle lobe airspace opacification is compatible with pneumonia. The left lung appears relatively clear. No pleural effusion or pneumothorax is seen. The cardiomediastinal silhouette is normal in size. No acute osseous abnormalities are identified. IMPRESSION: Right middle lobe pneumonia noted. Followup PA and lateral chest X-ray is recommended in 3-4 weeks following trial of antibiotic therapy to ensure resolution and exclude underlying malignancy. Electronically Signed   By: Roanna Raider M.D.   On: 02/16/2015 00:49    EKG:   Orders placed or performed during the hospital encounter of 02/16/15  . ED EKG  . ED EKG    ASSESSMENT AND PLAN:   1. Pneumonia: Community-acquired; right middle lobe.  No evidence of sepsis. The patient only has tachycardia but no fever or  leukocytosis. Discontinue vancomycin, continue Zithromax and Zosyn. Follow  blood cultures for sensitivities.  2. Acute on chronic respiratory failure. The patient is off BiPAP and on oxygen 4 L by nasal cannula. Continue nebulizer treatment.  3. COPD exacerbation. Multiple admission, start GOLD protocol. Continue nebulizer treatment including DuoNeb, dulera, Spiriva.  4. Hypertension: Continue carvedilol, enalapril and amlodipine.  * Anxiety. Ativan when necessary.  I discussed with the patient and advised the patient that she needs to stay in the hospital for further treatment. She was upset since she cannot be discharged to home today.  All the records are reviewed and case discussed with Care Management/Social Workerr. Management plans discussed with the patient, family and they are in agreement. Greater than 50% time was spent on coordination of care and face-to-face counseling.  CODE STATUS: Full code  TOTAL TIME TAKING CARE OF THIS PATIENT: 46 minutes.   POSSIBLE D/C IN 3 DAYS, DEPENDING ON CLINICAL CONDITION.   Shaune Pollackhen, Leilene Diprima M.D on 02/16/2015 at 3:43 PM  Between 7am to 6pm - Pager - [phone]  After 6pm go to [email or web address] - password [email or web address]  [facility] [place] [facility]  Office  [phone]  CC: Primary care physician; Pcp Not In System

## 2015-02-16 NOTE — Care Management (Signed)
Spoke with Sheral FlowEdwina at [facility]. She stated that patient had missed multiple appointments with home health nurse. Would make appointment and then not be at home. She stated that she was HRI with [facility] but had been released due to this.

## 2015-02-16 NOTE — ED Notes (Signed)
Pt had increased SOB that started this morning. Pt has been doing breathing treatments throughout the day with no relief. EMS states pt is from home and upon their arrival pt was 83% on room air. Pt was placed on Bi-Pap, given two Duoneb's and Solumedrol enroute. Pt has labored breathing upon arrival to ER. Breathing treatments continued and pt remains on BiPap.

## 2015-02-16 NOTE — Progress Notes (Signed)
ANTIBIOTIC CONSULT NOTE - INITIAL  Pharmacy Consult for vancomycin and Zosyn dosing Indication: sepsis  Allergies  Allergen Reactions  . Budesonide-Formoterol Fumarate Swelling    Patient Measurements: Height: 5\' 2"  (157.5 cm) Weight: 165 lb (74.844 kg) IBW/kg (Calculated) : 50.1 Adjusted Body Weight: 59kg  Vital Signs: Temp: 98.1 F (36.7 C) (11/18 0457) Temp Source: Oral (11/18 0457) BP: 120/77 mmHg (11/18 0457) Pulse Rate: 121 (11/18 0457) Intake/Output from previous day:   Intake/Output from this shift:    Labs:  Recent Labs  02/16/15 0035  WBC 9.3  HGB 14.1  PLT 359  CREATININE 0.82   Estimated Creatinine Clearance: 71.7 mL/min (by C-G formula based on Cr of 0.82). No results for input(s): VANCOTROUGH, VANCOPEAK, VANCORANDOM, GENTTROUGH, GENTPEAK, GENTRANDOM, TOBRATROUGH, TOBRAPEAK, TOBRARND, AMIKACINPEAK, AMIKACINTROU, AMIKACIN in the last 72 hours.   Microbiology: No results found for this or any previous visit (from the past 720 hour(s)).  Medical History: Past Medical History  Diagnosis Date  . COPD (chronic obstructive pulmonary disease) (HCC)   . CHF (congestive heart failure) (HCC)   . HTN (hypertension)   . GERD (gastroesophageal reflux disease)   . Depression   . Anxiety     Medications:   Assessment: Blood cx pending CXR: R middle lobe pneumonia  Goal of Therapy:  Vancomycin trough level 15-20 mcg/ml  Plan:  Vancomycin TBW 71.7kg  IBW 50.1kg  DW 59kg  Vd 41L kei 0.063 hr-1  T1/2 11 hours. 750 mg q 12 hours ordered with stacked dosing. Level before 5th dose.  Zosyn 4.5 grams q 8 hours ordered for Pseudomonas risk of recent abx usage as outpatient.  Ivar Domangue S 02/16/2015,6:25 AM

## 2015-02-17 LAB — GLUCOSE, CAPILLARY: Glucose-Capillary: 175 mg/dL — ABNORMAL HIGH (ref 65–99)

## 2015-02-17 MED ORDER — PREDNISONE 50 MG PO TABS: 50.0000 mg | ORAL_TABLET | Freq: Every day | ORAL | Status: DC

## 2015-02-17 MED ORDER — LEVOFLOXACIN 500 MG PO TABS: 500.0000 mg | ORAL_TABLET | Freq: Every day | ORAL | Status: DC

## 2015-02-17 MED ORDER — LORAZEPAM 0.5 MG PO TABS
0.5000 mg | ORAL_TABLET | Freq: Once | ORAL | Status: AC
  Administered 2015-02-17: 0.5 mg via ORAL

## 2015-02-17 NOTE — Care Management Note (Signed)
Case Management Note  Patient Details  Name: Julie Foley MRN: [id number] Date of Birth: Oct 09, 1955  Subjective/Objective:     Discussed HRI status with the Case Manager Team leader. No new home health orders upon discharge home today.                Action/Plan:   Expected Discharge Date:                  Expected Discharge Plan:     In-House Referral:     Discharge planning Services     Post Acute Care Choice:    Choice offered to:     DME Arranged:    DME Agency:     HH Arranged:    HH Agency:     Status of Service:     Medicare Important Message Given:    Date Medicare IM Given:    Medicare IM give by:    Date Additional Medicare IM Given:    Additional Medicare Important Message give by:     If discussed at Long Length of Stay Meetings, dates discussed:    Additional Comments:  [name] A, RN 02/17/2015, 4:15 PM

## 2015-02-17 NOTE — Care Management Note (Signed)
Case Management Note  Patient Details  Name: Julie BunSylvia L Foley MRN: [id number] Date of Birth: [birth year]  Subjective/Objective:    [name] at [facility], [phone]. [name] reports that [name] is no longer a patient of [name] (due to not keeping scheduled appointments). [name] has nurse aid services through Medicaid/[name] [name].. [name] has refused a referral to [facility]. HRI patient. CM is searching for another home health provider.                Action/Plan:   Expected Discharge Date:                  Expected Discharge Plan:     In-House Referral:     Discharge planning Services     Post Acute Care Choice:    Choice offered to:     DME Arranged:    DME Agency:     HH Arranged:    HH Agency:     Status of Service:     Medicare Important Message Given:    Date Medicare IM Given:    Medicare IM give by:    Date Additional Medicare IM Given:    Additional Medicare Important Message give by:     If discussed at Long Length of Stay Meetings, dates discussed:    Additional Comments:  Trey Bebee A, RN 02/17/2015, 10:38 AM

## 2015-02-17 NOTE — Discharge Instructions (Signed)
. °  DIET:  °Cardiac diet ° °DISCHARGE CONDITION:  °Stable ° °ACTIVITY:  °Activity as tolerated ° °OXYGEN:  °Home Oxygen: Yes.   °  °Oxygen Delivery: 4 liters/min via Patient connected to nasal cannula oxygen ° °DISCHARGE LOCATION:  °home  ° °If you experience worsening of your admission symptoms, develop shortness of breath, life threatening emergency, suicidal or homicidal thoughts you must seek medical attention immediately by calling 911 or calling your MD immediately  if symptoms less severe. ° °You Must read complete instructions/literature along with all the possible adverse reactions/side effects for all the Medicines you take and that have been prescribed to you. Take any new Medicines after you have completely understood and accpet all the possible adverse reactions/side effects.  ° °Please note ° °You were cared for by a hospitalist during your hospital stay. If you have any questions about your discharge medications or the care you received while you were in the hospital after you are discharged, you can call the unit and asked to speak with the hospitalist on call if the hospitalist that took care of you is not available. Once you are discharged, your primary care physician will handle any further medical issues. Please note that NO REFILLS for any discharge medications will be authorized once you are discharged, as it is imperative that you return to your primary care physician (or establish a relationship with a primary care physician if you do not have one) for your aftercare needs so that they can reassess your need for medications and monitor your lab values. ° ° °

## 2015-02-17 NOTE — Progress Notes (Addendum)
IV and tele were removed. Discharge instructions, follow-up appointments, and prescriptions were provided to the pt. The pt refused wheelchair and walked downstairs accompanied by her husband. Pt was wearing 4L of O2 which is what pt is normally on at home. Dr. paged regarding pt needing printed prescriptions because other pharmacy [place] in [place]. Dr. Milus MallickPrinted new prescriptions.

## 2015-02-21 LAB — CULTURE, BLOOD (ROUTINE X 2)
CULTURE: NO GROWTH
Culture: NO GROWTH

## 2015-02-26 NOTE — Discharge Summary (Signed)
[facility] - [place] at [facility]   PATIENT NAME: Julie KnockSylvia Foley    MR#:  [id number]  DATE OF BIRTH:  Jan 26, 1956  DATE OF ADMISSION:  02/16/2015 ADMITTING PHYSICIAN: Arnaldo NatalMichael S Diamond, MD  DATE OF DISCHARGE: 02/17/2015 10:51 AM  PRIMARY CARE PHYSICIAN: Pcp Not In System    ADMISSION DIAGNOSIS:  Healthcare-associated pneumonia [J18.9] Acute respiratory failure with hypoxemia (HCC) [J96.01]  DISCHARGE DIAGNOSIS:  Active Problems:   Sepsis (HCC)   SECONDARY DIAGNOSIS:   Past Medical History  Diagnosis Date  . COPD (chronic obstructive pulmonary disease) (HCC)   . CHF (congestive heart failure) (HCC)   . HTN (hypertension)   . GERD (gastroesophageal reflux disease)   . Depression   . Anxiety      ADMITTING HISTORY  Chief Complaint: shortness of breath HPI: the patient presents emergency department via EMS complaining of severe shortness of breath. The patient states that she began coughing more than usual and bringing up gray colored phlegm approximately 2 days ago. She denies fevers nausea or vomiting. In the emergency department the patient was initially requiring BiPAP to slow her respiratory rate and improve oxygenation. She was able to be transitioned to nasal cannula with 4 L of oxygen per her usual home requirement. Due to continued tachypnea and tachycardia as well as right middle lobe pneumonia seen on chest x-ray the emergency department staff called for admission.   HOSPITAL COURSE:   1. Pneumonia: Community-acquired; right middle lobe.  No evidence of sepsis. The patient only has tachycardia but no fever or leukocytosis. Discontinued vancomycin, continue Zithromax and Zosyn. Switch to oral antibiotics at discharge. Cultures have been negative.  2. Acute on chronic respiratory failure. The patient is off BiPAP and on oxygen 4 L by nasal cannula. Continue nebulizer treatment. Baseline 4 L oxygen at home. Patient feels she is back to  her normal with breathing and has requested to be discharged home.  3. COPD exacerbation. Multiple admission, start GOLD protocol. Continue nebulizer treatment including DuoNeb, dulera, Spiriva.  4. Hypertension: Continue carvedilol, enalapril and amlodipine.  5. Anxiety. Ativan when necessary.  Stable for discharge. Follow-up with PCP and pulmonary in 1 week.  High risk for readmission due to advanced COPD and noncompliance.   CONSULTS OBTAINED:     DRUG ALLERGIES:   Allergies  Allergen Reactions  . Budesonide-Formoterol Fumarate Swelling    DISCHARGE MEDICATIONS:   Discharge Medication List as of 02/17/2015 10:32 AM    START taking these medications   Details  levofloxacin (LEVAQUIN) 500 MG tablet Take 1 tablet (500 mg total) by mouth daily., Starting 02/17/2015, Until Discontinued, Normal    predniSONE (DELTASONE) 50 MG tablet Take 1 tablet (50 mg total) by mouth daily with breakfast., Starting 02/17/2015, Until Discontinued, Normal      CONTINUE these medications which have NOT CHANGED   Details  albuterol (PROVENTIL HFA;VENTOLIN HFA) 108 (90 BASE) MCG/ACT inhaler Inhale 2 puffs into the lungs every 4 (four) hours as needed for wheezing or shortness of breath., Until Discontinued, Historical Med    albuterol (PROVENTIL) (2.5 MG/3ML) 0.083% nebulizer solution Take 3 mLs (2.5 mg total) by nebulization every 4 (four) hours as needed for wheezing or shortness of breath., Starting 12/23/2014, Until Discontinued, Print    amLODipine (NORVASC) 5 MG tablet Take 5 mg by mouth daily., Until Discontinued, Historical Med    azithromycin (ZITHROMAX) 250 MG tablet Take 250 mg by mouth daily., Until Discontinued, Historical Med    beclomethasone (QVAR) 80 MCG/ACT  inhaler Inhale 1 puff into the lungs 2 (two) times daily., Until Discontinued, Historical Med    carvedilol (COREG) 12.5 MG tablet Take 1 tablet (12.5 mg total) by mouth 2 (two) times daily with a meal., Starting  08/13/2014, Until Discontinued, Normal    enalapril (VASOTEC) 20 MG tablet Take 20 mg by mouth 2 (two) times daily. , Until Discontinued, Historical Med    famotidine (PEPCID) 20 MG tablet Take 20 mg by mouth 2 (two) times daily., Until Discontinued, Historical Med    fluticasone (FLONASE) 50 MCG/ACT nasal spray Place 1 spray into both nostrils 2 (two) times daily. , Until Discontinued, Historical Med    Fluticasone-Salmeterol (ADVAIR) 500-50 MCG/DOSE AEPB Inhale 1 puff into the lungs 2 (two) times daily., Until Discontinued, Historical Med    hydrochlorothiazide (HYDRODIURIL) 25 MG tablet Take 25 mg by mouth daily., Until Discontinued, Historical Med    ipratropium (ATROVENT) 0.02 % nebulizer solution Take 0.5 mg by nebulization 4 (four) times daily as needed for wheezing or shortness of breath. , Until Discontinued, Historical Med    ipratropium-albuterol (DUONEB) 0.5-2.5 (3) MG/3ML SOLN Inhale 3 mLs into the lungs every 4 (four) hours as needed (wheezing/ shortness of breath). , Starting 01/05/2015, Until Discontinued, Historical Med    loratadine (CLARITIN) 10 MG tablet Take 10 mg by mouth daily., Until Discontinued, Historical Med    LORazepam (ATIVAN) 0.5 MG tablet Take 1 tablet (0.5 mg total) by mouth every 8 (eight) hours as needed for anxiety., Starting 01/03/2015, Until Discontinued, Print    montelukast (SINGULAIR) 10 MG tablet Take 10 mg by mouth every evening., Until Discontinued, Historical Med    nitroGLYCERIN (NITROSTAT) 0.4 MG SL tablet Place 0.4 mg under the tongue every 5 (five) minutes as needed for chest pain., Until Discontinued, Historical Med    roflumilast (DALIRESP) 500 MCG TABS tablet Take 500 mcg by mouth daily., Until Discontinued, Historical Med    tiotropium (SPIRIVA) 18 MCG inhalation capsule Place 18 mcg into inhaler and inhale daily., Until Discontinued, Historical Med         Today    VITAL SIGNS:  Blood pressure 103/66, pulse 100, temperature 98.5  F (36.9 C), temperature source Oral, resp. rate 20, height  (1.575 m), weight 74.844 kg (165 lb), SpO2 100 %.  I/O:  No intake or output data in the 24 hours ending 02/26/15 1357  PHYSICAL EXAMINATION:  Physical Exam  GENERAL:  59 y.o.-year-old patient lying in the bed with no acute distress.  LUNGS: Normal breath sounds bilaterally, no wheezing, rales,rhonchi or crepitation. No use of accessory muscles of respiration.  CARDIOVASCULAR: S1, S2 normal. No murmurs, rubs, or gallops.  ABDOMEN: Soft, non-tender, non-distended. Bowel sounds present. No organomegaly or mass.  NEUROLOGIC: Moves all 4 extremities. PSYCHIATRIC: The patient is alert and oriented x 3.  SKIN: No obvious rash, lesion, or ulcer.   DATA REVIEW:   CBC No results for input(s): WBC, HGB, HCT, PLT in the last 168 hours.  Chemistries  No results for input(s): NA, K, CL, CO2, GLUCOSE, BUN, CREATININE, CALCIUM, MG, AST, ALT, ALKPHOS, BILITOT in the last 168 hours.  Invalid input(s): GFRCGP  Cardiac Enzymes No results for input(s): TROPONINI in the last 168 hours.  Microbiology Results  Results for orders placed or performed during the hospital encounter of 02/16/15  Blood culture (routine x 2)     Status: None   Collection Time: 02/16/15 12:53 AM  Result Value Ref Range Status   Specimen Description BLOOD RIGHT  ANTECUBITAL  Final   Special Requests BOTTLES DRAWN AEROBIC AND ANAEROBIC  Final   Culture NO GROWTH 5 DAYS  Final   Report Status 02/21/2015 FINAL  Final  Blood culture (routine x 2)     Status: None   Collection Time: 02/16/15  1:00 AM  Result Value Ref Range Status   Specimen Description BLOOD LEFT ANTECUBITAL  Final   Special Requests BOTTLES DRAWN AEROBIC AND ANAEROBIC 6CC  Final   Culture NO GROWTH 5 DAYS  Final   Report Status 02/21/2015 FINAL  Final    RADIOLOGY:  No results found.    Follow up with PCP in 1 week.  Management plans discussed with the patient, family and they  are in agreement.  CODE STATUS:   TOTAL TIME TAKING CARE OF THIS PATIENT ON DAY OF DISCHARGE: more than 30 minutes.    Milagros Loll R M.D on 02/26/2015 at 1:57 PM  Between 7am to 6pm - Pager - [phone]  After 6pm go to [email or web address] - password [email or web address]  [facility]  Office  [phone]  CC: Primary care physician; Pcp Not In System     Note: This dictation was prepared with Dragon dictation along with smaller phrase technology. Any transcriptional errors that result from this process are unintentional.

## 2015-03-01 ENCOUNTER — Emergency Department: Payer: Medicaid Other

## 2015-03-01 ENCOUNTER — Encounter: Payer: Self-pay | Admitting: *Deleted

## 2015-03-01 ENCOUNTER — Emergency Department
Admission: EM | Admit: 2015-03-01 | Discharge: 2015-03-01 | Disposition: A | Discharge status: Home / Self Care | Payer: Medicaid Other | Attending: Emergency Medicine | Admitting: Emergency Medicine

## 2015-03-01 DIAGNOSIS — Z87891 Personal history of nicotine dependence: Secondary | ICD-10-CM | POA: Diagnosis not present

## 2015-03-01 DIAGNOSIS — J441 Chronic obstructive pulmonary disease with (acute) exacerbation: Secondary | ICD-10-CM | POA: Insufficient documentation

## 2015-03-01 DIAGNOSIS — I10 Essential (primary) hypertension: Secondary | ICD-10-CM | POA: Diagnosis not present

## 2015-03-01 DIAGNOSIS — Z7952 Long term (current) use of systemic steroids: Secondary | ICD-10-CM | POA: Diagnosis not present

## 2015-03-01 DIAGNOSIS — Z7951 Long term (current) use of inhaled steroids: Secondary | ICD-10-CM | POA: Insufficient documentation

## 2015-03-01 DIAGNOSIS — R0602 Shortness of breath: Secondary | ICD-10-CM | POA: Diagnosis present

## 2015-03-01 DIAGNOSIS — Z792 Long term (current) use of antibiotics: Secondary | ICD-10-CM | POA: Insufficient documentation

## 2015-03-01 LAB — COMPREHENSIVE METABOLIC PANEL
ALT: 22 U/L (ref 14–54)
ANION GAP: 10 (ref 5–15)
AST: 28 U/L (ref 15–41)
Albumin: 4.7 g/dL (ref 3.5–5.0)
Alkaline Phosphatase: 56 U/L (ref 38–126)
BUN: 15 mg/dL (ref 6–20)
CHLORIDE: 102 mmol/L (ref 101–111)
CO2: 31 mmol/L (ref 22–32)
CREATININE: 0.79 mg/dL (ref 0.44–1.00)
Calcium: 9.9 mg/dL (ref 8.9–10.3)
Glucose, Bld: 76 mg/dL (ref 65–99)
POTASSIUM: 4.2 mmol/L (ref 3.5–5.1)
SODIUM: 143 mmol/L (ref 135–145)
Total Bilirubin: 0.7 mg/dL (ref 0.3–1.2)
Total Protein: 7.8 g/dL (ref 6.5–8.1)

## 2015-03-01 LAB — CBC WITH DIFFERENTIAL/PLATELET
BASOS ABS: 0.1 10*3/uL (ref 0–0.1)
Eosinophils Absolute: 0.3 10*3/uL (ref 0–0.7)
Eosinophils Relative: 2 %
HEMATOCRIT: 41.2 % (ref 35.0–47.0)
Hemoglobin: 13.3 g/dL (ref 12.0–16.0)
Lymphs Abs: 2.1 10*3/uL (ref 1.0–3.6)
MCH: 31.6 pg (ref 26.0–34.0)
MCHC: 32.2 g/dL (ref 32.0–36.0)
MCV: 98.2 fL (ref 80.0–100.0)
MONO ABS: 1 10*3/uL — AB (ref 0.2–0.9)
Monocytes Relative: 7 %
NEUTROS ABS: 10.8 10*3/uL — AB (ref 1.4–6.5)
Platelets: 384 10*3/uL (ref 150–440)
RBC: 4.2 MIL/uL (ref 3.80–5.20)
RDW: 12.9 % (ref 11.5–14.5)
WBC: 15.8 10*3/uL — AB (ref 3.6–11.0)

## 2015-03-01 LAB — TROPONIN I

## 2015-03-01 LAB — BRAIN NATRIURETIC PEPTIDE: B NATRIURETIC PEPTIDE 5: 35 pg/mL (ref 0.0–100.0)

## 2015-03-01 MED ORDER — LORAZEPAM 2 MG/ML IJ SOLN
INTRAMUSCULAR | Status: AC
  Administered 2015-03-01: 1 mg
  Filled 2015-03-01: qty 1

## 2015-03-01 MED ORDER — IPRATROPIUM-ALBUTEROL 0.5-2.5 (3) MG/3ML IN SOLN
RESPIRATORY_TRACT | Status: AC
  Filled 2015-03-01: qty 6

## 2015-03-01 MED ORDER — IPRATROPIUM-ALBUTEROL 0.5-2.5 (3) MG/3ML IN SOLN
3.0000 mL | Freq: Once | RESPIRATORY_TRACT | Status: AC
  Administered 2015-03-01: 3 mL via RESPIRATORY_TRACT
  Filled 2015-03-01: qty 3

## 2015-03-01 MED ORDER — METHYLPREDNISOLONE SODIUM SUCC 125 MG IJ SOLR
125.0000 mg | Freq: Once | INTRAMUSCULAR | Status: AC
  Administered 2015-03-01: 125 mg via INTRAVENOUS
  Filled 2015-03-01: qty 2

## 2015-03-01 MED ORDER — IPRATROPIUM-ALBUTEROL 0.5-2.5 (3) MG/3ML IN SOLN
3.0000 mL | Freq: Once | RESPIRATORY_TRACT | Status: AC
  Administered 2015-03-01: 3 mL via RESPIRATORY_TRACT

## 2015-03-01 MED ORDER — IPRATROPIUM-ALBUTEROL 0.5-2.5 (3) MG/3ML IN SOLN
3.0000 mL | Freq: Once | RESPIRATORY_TRACT | Status: AC
Start: 2015-03-01 — End: 2015-03-01
  Administered 2015-03-01: 3 mL via RESPIRATORY_TRACT

## 2015-03-01 MED ORDER — PREDNISONE 20 MG PO TABS
60.0000 mg | ORAL_TABLET | Freq: Once | ORAL | Status: AC
  Administered 2015-03-01: 60 mg via ORAL
  Filled 2015-03-01: qty 3

## 2015-03-01 MED ORDER — IPRATROPIUM-ALBUTEROL 0.5-2.5 (3) MG/3ML IN SOLN
3.0000 mL | Freq: Once | RESPIRATORY_TRACT | Status: AC
  Administered 2015-03-01: 3 mL via RESPIRATORY_TRACT
  Filled 2015-03-01: qty 9

## 2015-03-01 NOTE — ED Notes (Signed)
Pt placed on 4 liters of oxygen nasal cannula. Sinus tach on monitor.  Skin warm and dry.  States feeling better.  No chest pain now.

## 2015-03-01 NOTE — ED Notes (Signed)
Dr Mont DuttonMalina okay with pt having food and drink.

## 2015-03-01 NOTE — ED Notes (Signed)
md at bedside.  meds given.  Sinus tach on monitor.

## 2015-03-01 NOTE — ED Notes (Signed)
Pt brought in via ems with bipap.  Pt has sob.  Began this morning and worse this afternoon while at social services.  No iv. RT in with pt.  md at bedside.  Pt alert.

## 2015-03-01 NOTE — Progress Notes (Signed)
Pt stated that she feels much better and bipap was taken off and was placed on 5lpm Diaz, sats 96%, respiratory rate 22/min, pt able to talk in complete sentences, RN  made aware

## 2015-03-01 NOTE — ED Notes (Signed)
Pt discharged home.  Husband at bedside.  Daughter to drive home.  Pt in NAD and eager to go home.  Discharge teaching done with pt and husband.  Voiced understanding.  No questions or concerns at this time.  Items with pt upon discharge.  No items left in ED.

## 2015-03-01 NOTE — ED Provider Notes (Signed)
[facility] Emergency Department Provider Note  ____________________________________________  Time seen: Approximately 4:31 PM  I have reviewed the triage vital signs and the nursing notes.   HISTORY  Chief Complaint Shortness of Breath    HPI Julie Foley is a 59 y.o. female history given by EMS as patient is too short of breath to say much. A sheet that had some problems with her COPD this morning and EMS went out there gave her breathing treatment she did well she went to social services was there apparently doing paperwork was today went to the bathroom and smelled some chemical smell which triggered another worsening of her COPD came and had to apply CPAP to her she is now very short of breath working very hard to breathe and wheezes in all her lung fields on CPAP 98% sat on CPAP area patient is a known COPD or   Past Medical History  Diagnosis Date  . COPD (chronic obstructive pulmonary disease) (HCC)   . CHF (congestive heart failure) (HCC)   . HTN (hypertension)   . GERD (gastroesophageal reflux disease)   . Depression   . Anxiety     Patient Active Problem List   Diagnosis Date Noted  . Sepsis (HCC) 02/16/2015  . COPD with acute exacerbation (HCC) 01/25/2015  . Acute respiratory failure with hypoxemia (HCC) 10/07/2014  . Anxiety 10/07/2014  . HTN (hypertension) 09/23/2014  . GERD (gastroesophageal reflux disease) 09/23/2014  . CHF (congestive heart failure) (HCC) 09/23/2014  . COPD exacerbation (HCC) 08/11/2014    Past Surgical History  Procedure Laterality Date  . Tubal ligation Bilateral     Current Outpatient Rx  Name  Route  Sig  Dispense  Refill  . albuterol (PROVENTIL HFA;VENTOLIN HFA) 108 (90 BASE) MCG/ACT inhaler   Inhalation   Inhale 2 puffs into the lungs every 4 (four) hours as needed for wheezing or shortness of breath.         Marland Kitchen albuterol (PROVENTIL) (2.5 MG/3ML) 0.083% nebulizer solution   Nebulization   Take 3  mLs (2.5 mg total) by nebulization every 4 (four) hours as needed for wheezing or shortness of breath.   75 mL   2   . amLODipine (NORVASC) 5 MG tablet   Oral   Take 5 mg by mouth daily.         Marland Kitchen azithromycin (ZITHROMAX) 250 MG tablet   Oral   Take 250 mg by mouth daily.         . beclomethasone (QVAR) 80 MCG/ACT inhaler   Inhalation   Inhale 1 puff into the lungs 2 (two) times daily.         . carvedilol (COREG) 12.5 MG tablet   Oral   Take 1 tablet (12.5 mg total) by mouth 2 (two) times daily with a meal.   30 tablet   0   . enalapril (VASOTEC) 20 MG tablet   Oral   Take 20 mg by mouth 2 (two) times daily.          . famotidine (PEPCID) 20 MG tablet   Oral   Take 20 mg by mouth 2 (two) times daily.         . fluticasone (FLONASE) 50 MCG/ACT nasal spray   Each Nare   Place 1 spray into both nostrils 2 (two) times daily.          . Fluticasone-Salmeterol (ADVAIR) 500-50 MCG/DOSE AEPB   Inhalation   Inhale 1 puff into the lungs  2 (two) times daily.         . hydrochlorothiazide (HYDRODIURIL) 25 MG tablet   Oral   Take 25 mg by mouth daily.         Marland Kitchen ipratropium (ATROVENT) 0.02 % nebulizer solution   Nebulization   Take 0.5 mg by nebulization 4 (four) times daily as needed for wheezing or shortness of breath.          Marland Kitchen ipratropium-albuterol (DUONEB) 0.5-2.5 (3) MG/3ML SOLN   Inhalation   Inhale 3 mLs into the lungs every 4 (four) hours as needed (wheezing/ shortness of breath).          Marland Kitchen levofloxacin (LEVAQUIN) 500 MG tablet   Oral   Take 1 tablet (500 mg total) by mouth daily.   7 tablet   0   . loratadine (CLARITIN) 10 MG tablet   Oral   Take 10 mg by mouth daily.         Marland Kitchen LORazepam (ATIVAN) 0.5 MG tablet   Oral   Take 1 tablet (0.5 mg total) by mouth every 8 (eight) hours as needed for anxiety.   30 tablet   0   . montelukast (SINGULAIR) 10 MG tablet   Oral   Take 10 mg by mouth every evening.         . nitroGLYCERIN  (NITROSTAT) 0.4 MG SL tablet   Sublingual   Place 0.4 mg under the tongue every 5 (five) minutes as needed for chest pain.         . predniSONE (DELTASONE) 50 MG tablet   Oral   Take 1 tablet (50 mg total) by mouth daily with breakfast.   6 tablet   0   . roflumilast (DALIRESP) 500 MCG TABS tablet   Oral   Take 500 mcg by mouth daily.         Marland Kitchen tiotropium (SPIRIVA) 18 MCG inhalation capsule   Inhalation   Place 18 mcg into inhaler and inhale daily.           Allergies Budesonide-formoterol fumarate  Family History  Problem Relation Age of Onset  . Cancer Mother   . Hypertension Mother   . Diabetes Mother   . Asthma Son     Social History Social History  Substance Use Topics  . Smoking status: Former Smoker -- 2.00 packs/day for 35 years    Types: Cigarettes  . Smokeless tobacco: None  . Alcohol Use: Yes    Review of Systems Constitutional: No fever/chills Eyes: No visual changes. ENT: No sore throat. Cardiovascular: Denies chest pain. Respiratory:shortness of breath. Gastrointestinal: No abdominal pain.  No nausea, no vomiting.  No diarrhea.  No constipation. Genitourinary: Negative for dysuria. Musculoskeletal: Negative for back pain. Skin: Negative for rash. Neurological: Negative for headaches, focal weakness or numbness.  10-point ROS otherwise negative.  ____________________________________________   PHYSICAL EXAM:  VITAL SIGNS: ED Triage Vitals  Enc Vitals Group     BP --      Pulse --      Resp --      Temp 03/01/15 1624 97.5 F (36.4 C)     Temp Source 03/01/15 1624 Tympanic     SpO2 --      Weight 03/01/15 1624 176 lb 5.9 oz (80 kg)     Height 03/01/15 1624  (1.575 m)     Head Cir --      Peak Flow --      Pain Score 03/01/15 1626 8  Pain Loc --      Pain Edu? --      Excl. in GC? --     Constitutional: Alert and oriented.in  acute respiratory distress. Eyes: Conjunctivae are normal. PERRL. EOMI. Head:  Atraumatic. Nose: No congestion/rhinnorhea. Mouth/Throat: Mucous membranes are moist.  Oropharynx non-erythematous. Neck: No stridor no JVD  Cardiovascular: Normal rate, regular rhythm. Grossly normal heart sounds.  Good peripheral circulation. Respiratory: Increased respiratory effort with accessory muscle movements.   retractions. Lungs wheezes throughout Gastrointestinal: Soft and nontender. No distention. No abdominal bruits. No CVA tenderness. Musculoskeletal: No lower extremity tenderness nor edema.  No joint effusions. Neurologic:  Normal speech and language. No gross focal neurologic deficits are appreciated. No gait instability. Skin:  Skin is warm, dry and intact. No rash noted. Psychiatric: Mood and affect are normal. Speech and behavior are normal.  ____________________________________________   LABS (all labs ordered are listed, but only abnormal results are displayed)  Labs Reviewed  CBC WITH DIFFERENTIAL/PLATELET - Abnormal; Notable for the following:    WBC 15.8 (*)    Neutro Abs 10.8 (*)    Monocytes Absolute 1.0 (*)    All other components within normal limits  COMPREHENSIVE METABOLIC PANEL  BRAIN NATRIURETIC PEPTIDE  TROPONIN I   ____________________________________________  EKG  EKG shows sinus tachycardia at a rate of 124 normal axis and ST T wave flattening especially inferiorly ____________________________________________  RADIOLOGY  Radiology reports no acute disease and resolution of previous pneumonia ____________________________________________   PROCEDURES  ----------------------------------------- 8:19 PM on 03/01/2015 -----------------------------------------  Patient reports now she feels fine and back to normal and wants to go home. Patient does look very well I will plan on discharging her I will give her some albuterol and Atrovent to last for tilt tonight until her medicines come in the morning and some prednisone.  _Patient reports  she is not allergic to prednisone. Patient also says she is out of her Ativan I will give her 6   1 mg pills to go home with ___________________________________________   INITIAL IMPRESSION / ASSESSMENT AND PLAN / ED COURSE  Pertinent labs & imaging results that were available during my care of the patient were reviewed by me and considered in my medical decision making (see chart for details).  ____________________________________________   FINAL CLINICAL IMPRESSION(S) / ED DIAGNOSES  Final diagnoses:  COPD exacerbation (HCC)      Arnaldo NatalPaul F Virginio Isidore, MD 03/01/15 2026

## 2015-03-01 NOTE — ED Notes (Signed)
Pt brought in via ems on bipap.  Pt became sob this am, and got worse this afternoon while at social services.  Pt has chest tightness on arrival.  ekg done.  Sinus tach on monitor. Iv started stat and labs sent.  Pt alert.  Skin warm and dry.  RT at bedside  Pt placed on cpap.  Pt with wheezing noted.  Breathing treatments given by rt.

## 2015-03-01 NOTE — Discharge Instructions (Signed)
Please use the DuoNeb's as needed. Take the prednisone for the next couple days. Please be sure to return call 911 if you need to if you get worse again. Please be careful to avoid any kind of the odors like the cleaning solution that triggered her attack today.

## 2015-03-01 NOTE — ED Notes (Signed)
Family with pt.  Sinus tach with occ pvc. on monitor.  Pt alert.  Iv in place.

## 2015-03-21 ENCOUNTER — Encounter: Payer: Self-pay | Admitting: Emergency Medicine

## 2015-03-21 ENCOUNTER — Emergency Department
Admission: EM | Admit: 2015-03-21 | Discharge: 2015-03-22 | Disposition: A | Discharge status: Home / Self Care | Payer: Medicaid Other | Attending: Emergency Medicine | Admitting: Emergency Medicine

## 2015-03-21 ENCOUNTER — Emergency Department: Payer: Medicaid Other

## 2015-03-21 DIAGNOSIS — J441 Chronic obstructive pulmonary disease with (acute) exacerbation: Secondary | ICD-10-CM | POA: Insufficient documentation

## 2015-03-21 DIAGNOSIS — Z87891 Personal history of nicotine dependence: Secondary | ICD-10-CM | POA: Diagnosis not present

## 2015-03-21 DIAGNOSIS — I10 Essential (primary) hypertension: Secondary | ICD-10-CM | POA: Diagnosis not present

## 2015-03-21 DIAGNOSIS — Z7951 Long term (current) use of inhaled steroids: Secondary | ICD-10-CM | POA: Diagnosis not present

## 2015-03-21 DIAGNOSIS — Z79899 Other long term (current) drug therapy: Secondary | ICD-10-CM | POA: Diagnosis not present

## 2015-03-21 DIAGNOSIS — R0600 Dyspnea, unspecified: Secondary | ICD-10-CM | POA: Diagnosis present

## 2015-03-21 DIAGNOSIS — Z792 Long term (current) use of antibiotics: Secondary | ICD-10-CM | POA: Diagnosis not present

## 2015-03-21 LAB — BASIC METABOLIC PANEL
ANION GAP: 4 — AB (ref 5–15)
BUN: 14 mg/dL (ref 6–20)
CALCIUM: 9.3 mg/dL (ref 8.9–10.3)
CHLORIDE: 101 mmol/L (ref 101–111)
CO2: 32 mmol/L (ref 22–32)
Creatinine, Ser: 0.85 mg/dL (ref 0.44–1.00)
GFR calc Af Amer: >60 mL/min (ref >60)
GFR calc non Af Amer: >60 mL/min (ref >60)
GLUCOSE: 89 mg/dL (ref 65–99)
POTASSIUM: 3.9 mmol/L (ref 3.5–5.1)
Sodium: 137 mmol/L (ref 135–145)

## 2015-03-21 LAB — CBC WITH DIFFERENTIAL/PLATELET
BASOS ABS: 0.1 10*3/uL (ref 0–0.1)
Basophils Relative: 1 %
Eosinophils Absolute: 0.6 10*3/uL (ref 0–0.7)
Eosinophils Relative: 10 %
HEMATOCRIT: 38.9 % (ref 35.0–47.0)
HEMOGLOBIN: 12.7 g/dL (ref 12.0–16.0)
LYMPHS PCT: 34 %
Lymphs Abs: 2 10*3/uL (ref 1.0–3.6)
MCH: 32.4 pg (ref 26.0–34.0)
MCHC: 32.7 g/dL (ref 32.0–36.0)
MCV: 98.9 fL (ref 80.0–100.0)
MONO ABS: 0.6 10*3/uL (ref 0.2–0.9)
MONOS PCT: 10 %
NEUTROS ABS: 2.5 10*3/uL (ref 1.4–6.5)
NEUTROS PCT: 45 %
Platelets: 291 10*3/uL (ref 150–440)
RBC: 3.94 MIL/uL (ref 3.80–5.20)
RDW: 13 % (ref 11.5–14.5)
WBC: 5.7 10*3/uL (ref 3.6–11.0)

## 2015-03-21 LAB — TROPONIN I: Troponin I: <0.03 ng/mL (ref <0.031)

## 2015-03-21 MED ORDER — IPRATROPIUM-ALBUTEROL 0.5-2.5 (3) MG/3ML IN SOLN
RESPIRATORY_TRACT | Status: AC
  Administered 2015-03-21: 23:00:00
  Filled 2015-03-21: qty 9

## 2015-03-21 MED ORDER — IPRATROPIUM-ALBUTEROL 0.5-2.5 (3) MG/3ML IN SOLN
3.0000 mL | Freq: Once | RESPIRATORY_TRACT | Status: AC
  Administered 2015-03-21: 3 mL via RESPIRATORY_TRACT

## 2015-03-21 NOTE — ED Provider Notes (Signed)
[facility] Emergency Department Provider Note  ____________________________________________  Time seen: Approximately 11:04 PM  I have reviewed the triage vital signs and the nursing notes.   HISTORY  Chief Complaint Respiratory Distress    HPI Julie Foley is a 59 y.o. female who presents to the ED from home via EMS with a chief complaint of shortness of breath. Patient has a history of COPD on 4 L home oxygen with frequent exacerbations who clean out a vacuum last night and inhale dust. Today she has been taking multiple nebulizer treatments at home without relief. Per EMS, patient appeared tired with labored breathing. IV Solu-Medrol and DuoNeb administered along with CPAP. Patient complains of associated chest tightness. Denies recent fever, chills, abdominal pain, nausea, vomiting, diarrhea. Denies recent travel or trauma. Nothing makes her symptoms better or worse.   Past Medical History  Diagnosis Date  . COPD (chronic obstructive pulmonary disease) (HCC)   . CHF (congestive heart failure) (HCC)   . HTN (hypertension)   . GERD (gastroesophageal reflux disease)   . Depression   . Anxiety     Patient Active Problem List   Diagnosis Date Noted  . Sepsis (HCC) 02/16/2015  . COPD with acute exacerbation (HCC) 01/25/2015  . Acute respiratory failure with hypoxemia (HCC) 10/07/2014  . Anxiety 10/07/2014  . HTN (hypertension) 09/23/2014  . GERD (gastroesophageal reflux disease) 09/23/2014  . CHF (congestive heart failure) (HCC) 09/23/2014  . COPD exacerbation (HCC) 08/11/2014    Past Surgical History  Procedure Laterality Date  . Tubal ligation Bilateral     Current Outpatient Rx  Name  Route  Sig  Dispense  Refill  . albuterol (PROVENTIL HFA;VENTOLIN HFA) 108 (90 BASE) MCG/ACT inhaler   Inhalation   Inhale 2 puffs into the lungs every 4 (four) hours as needed for wheezing or shortness of breath.         Marland Kitchen. albuterol (PROVENTIL) (2.5  MG/3ML) 0.083% nebulizer solution   Nebulization   Take 3 mLs (2.5 mg total) by nebulization every 4 (four) hours as needed for wheezing or shortness of breath.   75 mL   2   . amLODipine (NORVASC) 5 MG tablet   Oral   Take 5 mg by mouth daily.         Marland Kitchen. azithromycin (ZITHROMAX) 250 MG tablet   Oral   Take 250 mg by mouth daily.         . beclomethasone (QVAR) 80 MCG/ACT inhaler   Inhalation   Inhale 1 puff into the lungs 2 (two) times daily.         . carvedilol (COREG) 12.5 MG tablet   Oral   Take 1 tablet (12.5 mg total) by mouth 2 (two) times daily with a meal.   30 tablet   0   . enalapril (VASOTEC) 20 MG tablet   Oral   Take 20 mg by mouth 2 (two) times daily.          . famotidine (PEPCID) 20 MG tablet   Oral   Take 20 mg by mouth 2 (two) times daily.         . fluticasone (FLONASE) 50 MCG/ACT nasal spray   Each Nare   Place 1 spray into both nostrils 2 (two) times daily.          . Fluticasone-Salmeterol (ADVAIR) 500-50 MCG/DOSE AEPB   Inhalation   Inhale 1 puff into the lungs 2 (two) times daily.         .Marland Kitchen  hydrochlorothiazide (HYDRODIURIL) 25 MG tablet   Oral   Take 25 mg by mouth daily.         Marland Kitchen ipratropium (ATROVENT) 0.02 % nebulizer solution   Nebulization   Take 0.5 mg by nebulization 4 (four) times daily as needed for wheezing or shortness of breath.          Marland Kitchen ipratropium-albuterol (DUONEB) 0.5-2.5 (3) MG/3ML SOLN   Inhalation   Inhale 3 mLs into the lungs every 4 (four) hours as needed (wheezing/ shortness of breath).          Marland Kitchen levofloxacin (LEVAQUIN) 500 MG tablet   Oral   Take 1 tablet (500 mg total) by mouth daily.   7 tablet   0   . loratadine (CLARITIN) 10 MG tablet   Oral   Take 10 mg by mouth daily.         Marland Kitchen LORazepam (ATIVAN) 0.5 MG tablet   Oral   Take 1 tablet (0.5 mg total) by mouth every 8 (eight) hours as needed for anxiety.   15 tablet   0   . montelukast (SINGULAIR) 10 MG tablet   Oral   Take  10 mg by mouth every evening.         . nitroGLYCERIN (NITROSTAT) 0.4 MG SL tablet   Sublingual   Place 0.4 mg under the tongue every 5 (five) minutes as needed for chest pain.         . predniSONE (DELTASONE) 20 MG tablet      3 tablets daily 4 days   12 tablet   0   . roflumilast (DALIRESP) 500 MCG TABS tablet   Oral   Take 500 mcg by mouth daily.         Marland Kitchen tiotropium (SPIRIVA) 18 MCG inhalation capsule   Inhalation   Place 18 mcg into inhaler and inhale daily.           Allergies Budesonide-formoterol fumarate  Family History  Problem Relation Age of Onset  . Cancer Mother   . Hypertension Mother   . Diabetes Mother   . Asthma Son     Social History Social History  Substance Use Topics  . Smoking status: Former Smoker -- 2.00 packs/day for 35 years    Types: Cigarettes  . Smokeless tobacco: None  . Alcohol Use: Yes    Review of Systems Constitutional: No fever/chills Eyes: No visual changes. ENT: No sore throat. Cardiovascular: Positive for chest tightness. Respiratory: Positive for shortness of breath. Gastrointestinal: No abdominal pain.  No nausea, no vomiting.  No diarrhea.  No constipation. Genitourinary: Negative for dysuria. Musculoskeletal: Negative for back pain. Skin: Negative for rash. Neurological: Negative for headaches, focal weakness or numbness.  10-point ROS otherwise negative.  ____________________________________________   PHYSICAL EXAM:  VITAL SIGNS: ED Triage Vitals  Enc Vitals Group     BP --      Pulse --      Resp --      Temp --      Temp src --      SpO2 --      Weight --      Height --      Head Cir --      Peak Flow --      Pain Score --      Pain Loc --      Pain Edu? --      Excl. in GC? --     Constitutional: Alert and oriented.  Ill appearing and in moderate acute distress. Eyes: Conjunctivae are normal. PERRL. EOMI. Head: Atraumatic. Nose: No congestion/rhinnorhea. Mouth/Throat: Mucous  membranes are moist.  Oropharynx non-erythematous. Neck: No stridor.   Cardiovascular: Normal rate, regular rhythm. Grossly normal heart sounds.  Good peripheral circulation. Respiratory: Increased respiratory effort.  Retractions. Lungs with diffuse wheezing. Gastrointestinal: Soft and nontender. No distention. No abdominal bruits. No CVA tenderness. Musculoskeletal: No lower extremity tenderness nor edema.  No joint effusions. Neurologic:  Normal speech and language. No gross focal neurologic deficits are appreciated.  Skin:  Skin is warm, dry and intact. No rash noted. Psychiatric: Mood and affect are normal. Speech and behavior are normal.  ____________________________________________   LABS (all labs ordered are listed, but only abnormal results are displayed)  Labs Reviewed  BASIC METABOLIC PANEL - Abnormal; Notable for the following:    Anion gap 4 (*)    All other components within normal limits  CBC WITH DIFFERENTIAL/PLATELET  TROPONIN I   ____________________________________________  EKG  ED ECG REPORT I, Ninfa Giannelli J, the attending physician, personally viewed and interpreted this ECG.   Date: 03/22/2015  EKG Time: 0131  Rate: 108  Rhythm: sinus tachycardia  Axis: Normal  Intervals:none  ST&T Change: Nonspecific  ____________________________________________  RADIOLOGY  Portable chest x-ray (viewed by me, interpreted per Dr. Grace Isaac): COPD without acute superimposed finding.  ____________________________________________   PROCEDURES  Procedure(s) performed: None  Critical Care performed: Yes, see critical care note(s)    CRITICAL CARE Performed by: Irean Hong   Total critical care time: 45 minutes  Critical care time was exclusive of separately billable procedures and treating other patients.  Critical care was necessary to treat or prevent imminent or life-threatening deterioration.  Critical care was time spent personally by me on the  following activities: development of treatment plan with patient and/or surrogate as well as nursing, discussions with consultants, evaluation of patient's response to treatment, examination of patient, obtaining history from patient or surrogate, ordering and performing treatments and interventions, ordering and review of laboratory studies, ordering and review of radiographic studies, pulse oximetry and re-evaluation of patient's condition. ____________________________________________   INITIAL IMPRESSION / ASSESSMENT AND PLAN / ED COURSE  Pertinent labs & imaging results that were available during my care of the patient were reviewed by me and considered in my medical decision making (see chart for details).  59 year old female with COPD on continuous oxygen with exacerbation. Frequent ED visits for same. Arrives on BiPAP, IV Solu-Medrol given per EMS. Will initiate DuoNeb, check screening lab work, chest x-ray and reassess.   ----------------------------------------- 12:52 AM on 03/22/2015 -----------------------------------------  Patient requesting Ativan for anxiety associated with breathing difficulty. She is requesting to be taken off of the BiPAP. IV fluids hanging for tachycardia, heart rate 120. Breath sounds improved. Will try patient off BiPAP, administer Ativan and reassess. Already insisting that she will not be hospitalized.  ----------------------------------------- 2:42 AM on 03/22/2015 -----------------------------------------  Patient sleeping. IV fluids infusing. Pulse rate 112. Discussed with patient again regarding hospital admission. She is adamantly refusing to be admitted to the hospital. Will reassess heart rate after IV fluids completed. No wheezing currently. Good aeration.  ----------------------------------------- 4:22 AM on 03/22/2015 -----------------------------------------  Heart rate down to 100. Oxygenation is 100% on her chronic 4 L. Discussed with  patient who is eager for discharge home. Will continue patient on steroid burst. Strict return precautions given. Patient verbalizes understanding and agrees with plan of care.  ----------------------------------------- 5:50 AM on 03/22/2015 -----------------------------------------  Prior to  transport home, patient required an additional DuoNeb for wheezing. I again strongly encouraged her to stay for hospital admission. She again adamantly declines to stay and is eager for discharge home. Wheezing resolved, and patient was discharged home with her daughter. ____________________________________________   FINAL CLINICAL IMPRESSION(S) / ED DIAGNOSES  Final diagnoses:  COPD with acute exacerbation (HCC)      Irean Hong, MD 03/22/15 [phone]

## 2015-03-21 NOTE — ED Notes (Signed)
ems pt from home pt cleaned out a vaccume last night and inhaled dust, neb treatments at home with no help

## 2015-03-22 ENCOUNTER — Emergency Department
Admission: EM | Admit: 2015-03-22 | Discharge: 2015-03-22 | Disposition: A | Discharge status: Home / Self Care | Payer: Medicaid Other | Source: Home / Self Care | Attending: Emergency Medicine | Admitting: Emergency Medicine

## 2015-03-22 ENCOUNTER — Encounter: Payer: Self-pay | Admitting: Emergency Medicine

## 2015-03-22 DIAGNOSIS — Z79899 Other long term (current) drug therapy: Secondary | ICD-10-CM

## 2015-03-22 DIAGNOSIS — Z792 Long term (current) use of antibiotics: Secondary | ICD-10-CM | POA: Insufficient documentation

## 2015-03-22 DIAGNOSIS — Z7951 Long term (current) use of inhaled steroids: Secondary | ICD-10-CM

## 2015-03-22 DIAGNOSIS — I10 Essential (primary) hypertension: Secondary | ICD-10-CM

## 2015-03-22 DIAGNOSIS — Z87891 Personal history of nicotine dependence: Secondary | ICD-10-CM

## 2015-03-22 DIAGNOSIS — J441 Chronic obstructive pulmonary disease with (acute) exacerbation: Secondary | ICD-10-CM | POA: Insufficient documentation

## 2015-03-22 DIAGNOSIS — R569 Unspecified convulsions: Secondary | ICD-10-CM

## 2015-03-22 LAB — COMPREHENSIVE METABOLIC PANEL
ALT: 71 U/L — ABNORMAL HIGH (ref 14–54)
ANION GAP: 9 (ref 5–15)
AST: 131 U/L — ABNORMAL HIGH (ref 15–41)
Albumin: 3.9 g/dL (ref 3.5–5.0)
Alkaline Phosphatase: 90 U/L (ref 38–126)
BILIRUBIN TOTAL: 0.4 mg/dL (ref 0.3–1.2)
BUN: 19 mg/dL (ref 6–20)
CHLORIDE: 105 mmol/L (ref 101–111)
CO2: 23 mmol/L (ref 22–32)
Calcium: 8.9 mg/dL (ref 8.9–10.3)
Creatinine, Ser: 1.14 mg/dL — ABNORMAL HIGH (ref 0.44–1.00)
GFR, EST NON AFRICAN AMERICAN: 52 mL/min — AB (ref >60)
Glucose, Bld: 201 mg/dL — ABNORMAL HIGH (ref 65–99)
POTASSIUM: 4.6 mmol/L (ref 3.5–5.1)
Sodium: 137 mmol/L (ref 135–145)
TOTAL PROTEIN: 6.5 g/dL (ref 6.5–8.1)

## 2015-03-22 LAB — CBC
HEMATOCRIT: 34.5 % — AB (ref 35.0–47.0)
Hemoglobin: 11 g/dL — ABNORMAL LOW (ref 12.0–16.0)
MCH: 31.9 pg (ref 26.0–34.0)
MCHC: 31.8 g/dL — AB (ref 32.0–36.0)
MCV: 100.4 fL — AB (ref 80.0–100.0)
PLATELETS: 262 10*3/uL (ref 150–440)
RBC: 3.44 MIL/uL — ABNORMAL LOW (ref 3.80–5.20)
RDW: 12.9 % (ref 11.5–14.5)
WBC: 5.7 10*3/uL (ref 3.6–11.0)

## 2015-03-22 MED ORDER — IPRATROPIUM-ALBUTEROL 0.5-2.5 (3) MG/3ML IN SOLN
3.0000 mL | Freq: Once | RESPIRATORY_TRACT | Status: AC
  Administered 2015-03-22: 3 mL via RESPIRATORY_TRACT

## 2015-03-22 MED ORDER — LORAZEPAM 2 MG/ML IJ SOLN
0.5000 mg | Freq: Once | INTRAMUSCULAR | Status: AC
  Administered 2015-03-22: 0.5 mg via INTRAVENOUS
  Filled 2015-03-22: qty 1

## 2015-03-22 MED ORDER — PREDNISONE 20 MG PO TABS: ORAL_TABLET | ORAL | Status: DC

## 2015-03-22 MED ORDER — IPRATROPIUM-ALBUTEROL 0.5-2.5 (3) MG/3ML IN SOLN
3.0000 mL | Freq: Once | RESPIRATORY_TRACT | Status: AC
  Administered 2015-03-22: 3 mL via RESPIRATORY_TRACT
  Filled 2015-03-22: qty 3

## 2015-03-22 MED ORDER — LORAZEPAM 1 MG PO TABS
1.0000 mg | ORAL_TABLET | Freq: Once | ORAL | Status: AC
  Administered 2015-03-22: 1 mg via ORAL
  Filled 2015-03-22: qty 1

## 2015-03-22 MED ORDER — SODIUM CHLORIDE 0.9 % IV BOLUS (SEPSIS)
1000.0000 mL | Freq: Once | INTRAVENOUS | Status: AC
  Administered 2015-03-22: 1000 mL via INTRAVENOUS

## 2015-03-22 MED ORDER — LORAZEPAM 0.5 MG PO TABS: 0.5000 mg | ORAL_TABLET | Freq: Three times a day (TID) | ORAL | Status: DC | PRN

## 2015-03-22 MED ORDER — IPRATROPIUM-ALBUTEROL 0.5-2.5 (3) MG/3ML IN SOLN
RESPIRATORY_TRACT | Status: AC
  Filled 2015-03-22: qty 3

## 2015-03-22 NOTE — ED Notes (Signed)
[facility] pts daughter on the way. Instructed on follow up plan and med use

## 2015-03-22 NOTE — Discharge Instructions (Signed)
As we discussed please follow-up with your primary care physician in the next 1-2 days for recheck/reevaluation. Please use her medications as prescribed yesterday to you. Return to the emergency department for any further seizure-like activity, or any difficulty breathing, chest pain, or any other symptom personally concerning to your self.   Seizure, Adult A seizure is abnormal electrical activity in the brain. Seizures usually last from 30 seconds to 2 minutes. There are various types of seizures. Before a seizure, you may have a warning sensation (aura) that a seizure is about to occur. An aura may include the following symptoms:   Fear or anxiety.  Nausea.  Feeling like the room is spinning (vertigo).  Vision changes, such as seeing flashing lights or spots. Common symptoms during a seizure include:  A change in attention or behavior (altered mental status).  Convulsions with rhythmic jerking movements.  Drooling.  Rapid eye movements.  Grunting.  Loss of bladder and bowel control.  Bitter taste in the mouth.  Tongue biting. After a seizure, you may feel confused and sleepy. You may also have an injury resulting from convulsions during the seizure. HOME CARE INSTRUCTIONS   If you are given medicines, take them exactly as prescribed by your health care provider.  Keep all follow-up appointments as directed by your health care provider.  Do not swim or drive or engage in risky activity during which a seizure could cause further injury to you or others until your health care provider says it is OK.  Get adequate rest.  Teach friends and family what to do if you have a seizure. They should:  Lay you on the ground to prevent a fall.  Put a cushion under your head.  Loosen any tight clothing around your neck.  Turn you on your side. If vomiting occurs, this helps keep your airway clear.  Stay with you until you recover.  Know whether or not you need emergency  care. SEEK IMMEDIATE MEDICAL CARE IF:  The seizure lasts longer than 5 minutes.  The seizure is severe or you do not wake up immediately after the seizure.  You have an altered mental status after the seizure.  You are having more frequent or worsening seizures. Someone should drive you to the emergency department or call local emergency services (911 in [place] MAKE SURE YOU:  Understand these instructions.  Will watch your condition.  Will get help right away if you are not doing well or get worse.   This information is not intended to replace advice given to you by your health care provider. Make sure you discuss any questions you have with your health care provider.   Document Released: 03/14/2000 Document Revised: 04/07/2014 Document Reviewed: 10/27/2012 Elsevier Interactive Patient Education [employer]2016 [employer]

## 2015-03-22 NOTE — ED Notes (Signed)
Per ems she was seen and trea this am  Was discharged.. The family states she was walking across floor and fell had poss sz like activity  On arrival aler nd will answer questions aprop

## 2015-03-22 NOTE — Discharge Instructions (Signed)
1. Take steroid as prescribed (prednisone 60 mg daily 4 days). 2. Return to the ER for worsening symptoms, persistent vomiting, difficulty breathing or other concerns.  Chronic Obstructive Pulmonary Disease Exacerbation Chronic obstructive pulmonary disease (COPD) is a common lung problem. In COPD, the flow of air from the lungs is limited. COPD exacerbations are times that breathing gets worse and you need extra treatment. Without treatment they can be life threatening. If they happen often, your lungs can become more damaged. If your COPD gets worse, your doctor may treat you with:  Medicines.  Oxygen.  Different ways to clear your airway, such as using a mask. HOME CARE  Do not smoke.  Avoid tobacco smoke and other things that bother your lungs.  If given, take your antibiotic medicine as told. Finish the medicine even if you start to feel better.  Only take medicines as told by your doctor.  Drink enough fluids to keep your pee (urine) clear or pale yellow (unless your doctor has told you not to).  Use a cool mist machine (vaporizer).  If you use oxygen or a machine that turns liquid medicine into a mist (nebulizer), continue to use them as told.  Keep up with shots (vaccinations) as told by your doctor.  Exercise regularly.  Eat healthy foods.  Keep all doctor visits as told. GET HELP RIGHT AWAY IF:  You are very short of breath and it gets worse.  You have trouble talking.  You have bad chest pain.  You have blood in your spit (sputum).  You have a fever.  You keep throwing up (vomiting).  You feel weak, or you pass out (faint).  You feel confused.  You keep getting worse. MAKE SURE YOU:  Understand these instructions.  Will watch your condition.  Will get help right away if you are not doing well or get worse.   This information is not intended to replace advice given to you by your health care provider. Make sure you discuss any questions you have  with your health care provider.   Document Released: 03/06/2011 Document Revised: 04/07/2014 Document Reviewed: 11/19/2012 Elsevier Interactive Patient Education [employer]2016 [employer] Inc.

## 2015-03-22 NOTE — ED Provider Notes (Signed)
[facility] Emergency Department Provider Note  Time seen: 1:09 PM  I have reviewed the triage vital signs and the nursing notes.   HISTORY  Chief Complaint No chief complaint on file.    HPI Julie Foley is a 59 y.o. female with a past medical history of COPD, CHF, hypertension, anxiety who presents to the emergency department emergency traffic for altered mental status. According to the patient and EMS reports she was very combative and altered upon EMS arrival. They were called by the patient's family. While being transported to the hospital the patient became more alert, less combative, and on arrival to the emergency department the patient is alert and oriented, calm and cooperative asking to be discharged home. Patient denies any history of seizures in the past. She does state she takes Ativan daily but has been out of it for the past 3 days. Denies alcohol intake. Denies any new medications. The patient was seen in the emergency department last night for a COPD exacerbation, she has not filled her medications prescribed to her yet (including lorazepam). Patient states her breathing is much improved compared to last night. Denies any complaints at this time.     Past Medical History  Diagnosis Date  . COPD (chronic obstructive pulmonary disease) (HCC)   . CHF (congestive heart failure) (HCC)   . HTN (hypertension)   . GERD (gastroesophageal reflux disease)   . Depression   . Anxiety     Patient Active Problem List   Diagnosis Date Noted  . Sepsis (HCC) 02/16/2015  . COPD with acute exacerbation (HCC) 01/25/2015  . Acute respiratory failure with hypoxemia (HCC) 10/07/2014  . Anxiety 10/07/2014  . HTN (hypertension) 09/23/2014  . GERD (gastroesophageal reflux disease) 09/23/2014  . CHF (congestive heart failure) (HCC) 09/23/2014  . COPD exacerbation (HCC) 08/11/2014    Past Surgical History  Procedure Laterality Date  . Tubal ligation Bilateral      Current Outpatient Rx  Name  Route  Sig  Dispense  Refill  . albuterol (PROVENTIL HFA;VENTOLIN HFA) 108 (90 BASE) MCG/ACT inhaler   Inhalation   Inhale 2 puffs into the lungs every 4 (four) hours as needed for wheezing or shortness of breath.         Marland Kitchen albuterol (PROVENTIL) (2.5 MG/3ML) 0.083% nebulizer solution   Nebulization   Take 3 mLs (2.5 mg total) by nebulization every 4 (four) hours as needed for wheezing or shortness of breath.   75 mL   2   . amLODipine (NORVASC) 5 MG tablet   Oral   Take 5 mg by mouth daily.         Marland Kitchen azithromycin (ZITHROMAX) 250 MG tablet   Oral   Take 250 mg by mouth daily.         . beclomethasone (QVAR) 80 MCG/ACT inhaler   Inhalation   Inhale 1 puff into the lungs 2 (two) times daily.         . carvedilol (COREG) 12.5 MG tablet   Oral   Take 1 tablet (12.5 mg total) by mouth 2 (two) times daily with a meal.   30 tablet   0   . enalapril (VASOTEC) 20 MG tablet   Oral   Take 20 mg by mouth 2 (two) times daily.          . famotidine (PEPCID) 20 MG tablet   Oral   Take 20 mg by mouth 2 (two) times daily.         Marland Kitchen  fluticasone (FLONASE) 50 MCG/ACT nasal spray   Each Nare   Place 1 spray into both nostrils 2 (two) times daily.          . Fluticasone-Salmeterol (ADVAIR) 500-50 MCG/DOSE AEPB   Inhalation   Inhale 1 puff into the lungs 2 (two) times daily.         . hydrochlorothiazide (HYDRODIURIL) 25 MG tablet   Oral   Take 25 mg by mouth daily.         Marland Kitchen ipratropium (ATROVENT) 0.02 % nebulizer solution   Nebulization   Take 0.5 mg by nebulization 4 (four) times daily as needed for wheezing or shortness of breath.          Marland Kitchen ipratropium-albuterol (DUONEB) 0.5-2.5 (3) MG/3ML SOLN   Inhalation   Inhale 3 mLs into the lungs every 4 (four) hours as needed (wheezing/ shortness of breath).          Marland Kitchen levofloxacin (LEVAQUIN) 500 MG tablet   Oral   Take 1 tablet (500 mg total) by mouth daily.   7 tablet   0    . loratadine (CLARITIN) 10 MG tablet   Oral   Take 10 mg by mouth daily.         Marland Kitchen LORazepam (ATIVAN) 0.5 MG tablet   Oral   Take 1 tablet (0.5 mg total) by mouth every 8 (eight) hours as needed for anxiety.   15 tablet   0   . montelukast (SINGULAIR) 10 MG tablet   Oral   Take 10 mg by mouth every evening.         . nitroGLYCERIN (NITROSTAT) 0.4 MG SL tablet   Sublingual   Place 0.4 mg under the tongue every 5 (five) minutes as needed for chest pain.         . predniSONE (DELTASONE) 20 MG tablet      3 tablets daily 4 days   12 tablet   0   . roflumilast (DALIRESP) 500 MCG TABS tablet   Oral   Take 500 mcg by mouth daily.         Marland Kitchen tiotropium (SPIRIVA) 18 MCG inhalation capsule   Inhalation   Place 18 mcg into inhaler and inhale daily.           Allergies Budesonide-formoterol fumarate  Family History  Problem Relation Age of Onset  . Cancer Mother   . Hypertension Mother   . Diabetes Mother   . Asthma Son     Social History Social History  Substance Use Topics  . Smoking status: Former Smoker -- 2.00 packs/day for 35 years    Types: Cigarettes  . Smokeless tobacco: Not on file  . Alcohol Use: Yes    Review of Systems Constitutional: Negative for fever. Cardiovascular: Negative for chest pain. Respiratory: Positive for shortness breath, improved compared to yesterday per patient. Gastrointestinal: Negative for abdominal pain Genitourinary: Negative for dysuria. Neurological: Negative for headache 10-point ROS otherwise negative.  ____________________________________________   PHYSICAL EXAM:  Constitutional: Alert and oriented. Well appearing and in no distress. Calm and cooperative. Eyes: Normal exam ENT   Head: Normocephalic and atraumatic.   Mouth/Throat: Mucous membranes are moist. Cardiovascular: Regular rhythm rate around 120 bpm. Respiratory: Mild tachypnea, mild expiratory wheezes bilaterally. No rales or  rhonchi. Gastrointestinal: Soft and nontender. No distention.  Musculoskeletal: Nontender with normal range of motion in all extremities.  Neurologic:  Normal speech and language. No gross focal neurologic deficits  Skin:  Skin is warm, dry  and intact.  Psychiatric: Mood and affect are normal. Speech and behavior are normal.  ____________________________________________    EKG  EKG reviewed and interpreted by myself shows sinus tachycardia 126 bpm, narrow QRS, normal axis, normal intervals besides a prolonged QTC, nonspecific ST changes. No ST elevations.  ____________________________________________   INITIAL IMPRESSION / ASSESSMENT AND PLAN / ED COURSE  Pertinent labs & imaging results that were available during my care of the patient were reviewed by me and considered in my medical decision making (see chart for details).  Patient presents the emergency department emergency traffic for altered mental status, likely due to a seizure. Patient takes Ativan daily but has been out of it for the past 3 days, this could possibly precipitated her seizure. We will check basic labs. The patient was seen last sign the emergency department for COPD, patient is frequently seen in the emergency department for the same. Asians states as far as her breathing she feels much better. Patient does have expiratory wheezing bilaterally, we'll treat with a DuoNeb in the emergency department and closely monitor. We will check basic labs, and treat with Ativan hopefully prevent any further seizures. The patient was discharged with a short course of Ativan last night which she will fill today, she states she has plans to follow up with her primary care doctor in the next couple days.  Labs are within normal limits. Patient's daughter is now here, the patient states she is leaving and going home. Patient's heart rate remains around 100-110 bpm. I discussed with the patient typically we would keep her here longer to  monitor her make sure her breathing improves and her wheeze resolves. Patient states she has steroids at home, Ativan at home, nebulizers at home, and she wishes to leave now. We will discharge the patient home, I discussed with the daughter very strict return precautions to which the daughter is agreeable and will watch her today.  ________________________________________   FINAL CLINICAL IMPRESSION(S) / ED DIAGNOSES  Altered mental status Seizure  Minna AntisKevin Teddie Curd, MD 03/22/15 1415

## 2015-03-22 NOTE — ED Notes (Signed)
Went thru pts dc instructions, patient getting ready to leave and became winded, reported increased shortness of breath with increased work of breathing. Pt requesting a breathing treatment prior to DC EDP made aware new orders received.

## 2015-03-30 ENCOUNTER — Emergency Department: Payer: Medicaid Other

## 2015-03-30 ENCOUNTER — Emergency Department
Admission: EM | Admit: 2015-03-30 | Discharge: 2015-03-30 | Disposition: A | Discharge status: Home / Self Care | Payer: Medicaid Other | Attending: Student | Admitting: Student

## 2015-03-30 ENCOUNTER — Encounter: Payer: Self-pay | Admitting: Emergency Medicine

## 2015-03-30 DIAGNOSIS — J441 Chronic obstructive pulmonary disease with (acute) exacerbation: Secondary | ICD-10-CM | POA: Diagnosis not present

## 2015-03-30 DIAGNOSIS — Z792 Long term (current) use of antibiotics: Secondary | ICD-10-CM | POA: Diagnosis not present

## 2015-03-30 DIAGNOSIS — Z79899 Other long term (current) drug therapy: Secondary | ICD-10-CM | POA: Diagnosis not present

## 2015-03-30 DIAGNOSIS — I10 Essential (primary) hypertension: Secondary | ICD-10-CM | POA: Insufficient documentation

## 2015-03-30 DIAGNOSIS — Z87891 Personal history of nicotine dependence: Secondary | ICD-10-CM | POA: Insufficient documentation

## 2015-03-30 DIAGNOSIS — R079 Chest pain, unspecified: Secondary | ICD-10-CM | POA: Insufficient documentation

## 2015-03-30 DIAGNOSIS — Z7951 Long term (current) use of inhaled steroids: Secondary | ICD-10-CM | POA: Diagnosis not present

## 2015-03-30 DIAGNOSIS — J449 Chronic obstructive pulmonary disease, unspecified: Secondary | ICD-10-CM

## 2015-03-30 LAB — COMPREHENSIVE METABOLIC PANEL
ALBUMIN: 4.6 g/dL (ref 3.5–5.0)
ALK PHOS: 66 U/L (ref 38–126)
ALT: 26 U/L (ref 14–54)
AST: 20 U/L (ref 15–41)
Anion gap: 7 (ref 5–15)
BUN: 18 mg/dL (ref 6–20)
CALCIUM: 9.2 mg/dL (ref 8.9–10.3)
CHLORIDE: 103 mmol/L (ref 101–111)
CO2: 30 mmol/L (ref 22–32)
CREATININE: 1.04 mg/dL — AB (ref 0.44–1.00)
GFR calc Af Amer: >60 mL/min (ref >60)
GFR calc non Af Amer: 58 mL/min — ABNORMAL LOW (ref >60)
GLUCOSE: 86 mg/dL (ref 65–99)
Potassium: 4.1 mmol/L (ref 3.5–5.1)
SODIUM: 140 mmol/L (ref 135–145)
Total Bilirubin: 0.6 mg/dL (ref 0.3–1.2)
Total Protein: 7.4 g/dL (ref 6.5–8.1)

## 2015-03-30 LAB — CBC WITH DIFFERENTIAL/PLATELET
BASOS ABS: 0.1 10*3/uL (ref 0–0.1)
BASOS PCT: 1 %
EOS ABS: 0.7 10*3/uL (ref 0–0.7)
Eosinophils Relative: 8 %
HCT: 39.4 % (ref 35.0–47.0)
HEMOGLOBIN: 12.9 g/dL (ref 12.0–16.0)
LYMPHS PCT: 22 %
Lymphs Abs: 2 10*3/uL (ref 1.0–3.6)
MCH: 32.3 pg (ref 26.0–34.0)
MCHC: 32.7 g/dL (ref 32.0–36.0)
MCV: 98.6 fL (ref 80.0–100.0)
MONO ABS: 0.7 10*3/uL (ref 0.2–0.9)
Monocytes Relative: 8 %
NEUTROS PCT: 61 %
Neutro Abs: 5.5 10*3/uL (ref 1.4–6.5)
PLATELETS: 385 10*3/uL (ref 150–440)
RBC: 4 MIL/uL (ref 3.80–5.20)
RDW: 13.1 % (ref 11.5–14.5)
WBC: 9 10*3/uL (ref 3.6–11.0)

## 2015-03-30 LAB — BLOOD GAS, ARTERIAL
Acid-Base Excess: 5.8 mmol/L — ABNORMAL HIGH (ref 0.0–3.0)
Bicarbonate: 32.8 mEq/L — ABNORMAL HIGH (ref 21.0–28.0)
DELIVERY SYSTEMS: POSITIVE
Expiratory PAP: 5
FIO2: 35
INSPIRATORY PAP: 18
MECHANICAL RATE: 10
O2 Saturation: 77.6 %
PCO2 ART: 58 mmHg — AB (ref 32.0–48.0)
PH ART: 7.36 (ref 7.350–7.450)
Patient temperature: 37
pO2, Arterial: 44 mmHg — ABNORMAL LOW (ref 83.0–108.0)

## 2015-03-30 LAB — TROPONIN I

## 2015-03-30 LAB — BRAIN NATRIURETIC PEPTIDE: B NATRIURETIC PEPTIDE 5: 21 pg/mL (ref 0.0–100.0)

## 2015-03-30 MED ORDER — IPRATROPIUM-ALBUTEROL 0.5-2.5 (3) MG/3ML IN SOLN
3.0000 mL | Freq: Once | RESPIRATORY_TRACT | Status: AC
  Administered 2015-03-30: 3 mL via RESPIRATORY_TRACT
  Filled 2015-03-30: qty 3

## 2015-03-30 MED ORDER — ASPIRIN 81 MG PO CHEW
CHEWABLE_TABLET | ORAL | Status: AC
  Administered 2015-03-30: 324 mg via ORAL
  Filled 2015-03-30: qty 4

## 2015-03-30 MED ORDER — PREDNISONE 20 MG PO TABS: 60.0000 mg | ORAL_TABLET | Freq: Every day | ORAL | Status: DC

## 2015-03-30 MED ORDER — ASPIRIN 81 MG PO CHEW
324.0000 mg | CHEWABLE_TABLET | Freq: Once | ORAL | Status: AC
  Administered 2015-03-30: 324 mg via ORAL

## 2015-03-30 MED ORDER — IPRATROPIUM-ALBUTEROL 0.5-2.5 (3) MG/3ML IN SOLN: 3.0000 mL | Freq: Four times a day (QID) | RESPIRATORY_TRACT | Status: DC | PRN

## 2015-03-30 MED ORDER — METHYLPREDNISOLONE SODIUM SUCC 125 MG IJ SOLR
125.0000 mg | Freq: Once | INTRAMUSCULAR | Status: AC
  Administered 2015-03-30: 125 mg via INTRAVENOUS
  Filled 2015-03-30: qty 2

## 2015-03-30 NOTE — ED Notes (Signed)
Bipap applied by respiratory 35% oxygen at this time.

## 2015-03-30 NOTE — ED Notes (Addendum)
Pt arrived via EMS from home with c/o chest pain related to respiratory difficulty. Pt tried Duoneb at home but did not have any relief. Placed on CPAP by EMS and given albuterol nebs. Pt is alert and oriented. Home oxygen dependent 4L.

## 2015-03-30 NOTE — ED Provider Notes (Signed)
[facility] Emergency Department Provider Note  ____________________________________________  Time seen: Approximately 2:53 PM  I have reviewed the triage vital signs and the nursing notes.   HISTORY  Chief Complaint Respiratory Distress and Chest Pain    HPI Julie Foley is a 59 y.o. female with history of CHF, COPD, hypertension, anxiety who presents for evaluation of chest pain and shortness of breath, gradual onset today, constant since onset, currently moderate. Patient reports that approximately 11:30 this morning she developed some central chest tightness. She thought this might be onset of COPD flare. She reached for her ipratropium nebulizer however she did not have any albuterol to "mix it with", she began to panic, called EMS. On their arrival, she was placed on CPAP. Pain in her chest is not worsened with exertion, not pleuritic, not ripping and tearing or nature or radiating to the back or down towards the feet. No cough, no fevers, no vomiting, diarrhea, or chills. Chest pain has improved at this time.   Past Medical History  Diagnosis Date  . COPD (chronic obstructive pulmonary disease) (HCC)   . CHF (congestive heart failure) (HCC)   . HTN (hypertension)   . GERD (gastroesophageal reflux disease)   . Depression   . Anxiety     Patient Active Problem List   Diagnosis Date Noted  . Sepsis (HCC) 02/16/2015  . COPD with acute exacerbation (HCC) 01/25/2015  . Acute respiratory failure with hypoxemia (HCC) 10/07/2014  . Anxiety 10/07/2014  . HTN (hypertension) 09/23/2014  . GERD (gastroesophageal reflux disease) 09/23/2014  . CHF (congestive heart failure) (HCC) 09/23/2014  . COPD exacerbation (HCC) 08/11/2014    Past Surgical History  Procedure Laterality Date  . Tubal ligation Bilateral     Current Outpatient Rx  Name  Route  Sig  Dispense  Refill  . albuterol (PROVENTIL HFA;VENTOLIN HFA) 108 (90 BASE) MCG/ACT inhaler    Inhalation   Inhale 2 puffs into the lungs every 4 (four) hours as needed for wheezing or shortness of breath.         Marland Kitchen albuterol (PROVENTIL) (2.5 MG/3ML) 0.083% nebulizer solution   Nebulization   Take 3 mLs (2.5 mg total) by nebulization every 4 (four) hours as needed for wheezing or shortness of breath.   75 mL   2   . amLODipine (NORVASC) 5 MG tablet   Oral   Take 5 mg by mouth daily.         Marland Kitchen azithromycin (ZITHROMAX) 250 MG tablet   Oral   Take 250 mg by mouth daily.         . beclomethasone (QVAR) 80 MCG/ACT inhaler   Inhalation   Inhale 1 puff into the lungs 2 (two) times daily.         . carvedilol (COREG) 12.5 MG tablet   Oral   Take 1 tablet (12.5 mg total) by mouth 2 (two) times daily with a meal.   30 tablet   0   . enalapril (VASOTEC) 20 MG tablet   Oral   Take 20 mg by mouth 2 (two) times daily.          . famotidine (PEPCID) 20 MG tablet   Oral   Take 20 mg by mouth 2 (two) times daily.         . fluticasone (FLONASE) 50 MCG/ACT nasal spray   Each Nare   Place 1 spray into both nostrils 2 (two) times daily.          Marland Kitchen  Fluticasone-Salmeterol (ADVAIR) 500-50 MCG/DOSE AEPB   Inhalation   Inhale 1 puff into the lungs 2 (two) times daily.         . hydrochlorothiazide (HYDRODIURIL) 25 MG tablet   Oral   Take 25 mg by mouth daily.         Marland Kitchen ipratropium (ATROVENT) 0.02 % nebulizer solution   Nebulization   Take 0.5 mg by nebulization 4 (four) times daily as needed for wheezing or shortness of breath.          Marland Kitchen ipratropium-albuterol (DUONEB) 0.5-2.5 (3) MG/3ML SOLN   Inhalation   Inhale 3 mLs into the lungs every 4 (four) hours as needed (wheezing/ shortness of breath).          . loratadine (CLARITIN) 10 MG tablet   Oral   Take 10 mg by mouth daily.         Marland Kitchen LORazepam (ATIVAN) 0.5 MG tablet   Oral   Take 1 tablet (0.5 mg total) by mouth every 8 (eight) hours as needed for anxiety.   15 tablet   0   . montelukast  (SINGULAIR) 10 MG tablet   Oral   Take 10 mg by mouth every evening.         . nitroGLYCERIN (NITROSTAT) 0.4 MG SL tablet   Sublingual   Place 0.4 mg under the tongue every 5 (five) minutes as needed for chest pain.         . roflumilast (DALIRESP) 500 MCG TABS tablet   Oral   Take 500 mcg by mouth daily.         Marland Kitchen tiotropium (SPIRIVA) 18 MCG inhalation capsule   Inhalation   Place 18 mcg into inhaler and inhale daily.         Marland Kitchen levofloxacin (LEVAQUIN) 500 MG tablet   Oral   Take 1 tablet (500 mg total) by mouth daily.   7 tablet   0   . predniSONE (DELTASONE) 20 MG tablet      3 tablets daily 4 days   12 tablet   0     Allergies Budesonide-formoterol fumarate  Family History  Problem Relation Age of Onset  . Cancer Mother   . Hypertension Mother   . Diabetes Mother   . Asthma Son     Social History Social History  Substance Use Topics  . Smoking status: Former Smoker -- 2.00 packs/day for 35 years    Types: Cigarettes  . Smokeless tobacco: None  . Alcohol Use: Yes    Review of Systems Constitutional: No fever/chills Eyes: No visual changes. ENT: No sore throat. Cardiovascular: +chest pain. Respiratory: + shortness of breath. Gastrointestinal: No abdominal pain.  No nausea, no vomiting.  No diarrhea.  No constipation. Genitourinary: Negative for dysuria. Musculoskeletal: Negative for back pain. Skin: Negative for rash. Neurological: Negative for headaches, focal weakness or numbness.  10-point ROS otherwise negative.  ____________________________________________   PHYSICAL EXAM:  VITAL SIGNS: ED Triage Vitals  Enc Vitals Group     BP 03/30/15 1431 130/94 mmHg     Pulse Rate 03/30/15 1431 109     Resp --      Temp --      Temp src --      SpO2 03/30/15 1431 100 %     Weight 03/30/15 1434 176 lb (79.833 kg)     Height 03/30/15 1434  (1.626 m)     Head Cir --      Peak Flow --  Pain Score 03/30/15 1431 7     Pain Loc --       Pain Edu? --      Excl. in GC? --     Constitutional: Alert and oriented.Appears quite comfortable on BiPAP, speaking in full sentences. Eyes: Conjunctivae are normal. PERRL. EOMI. Head: Atraumatic. Nose: No congestion/rhinnorhea. Mouth/Throat: Mucous membranes are moist.  Oropharynx non-erythematous. Neck: No stridor.   Cardiovascular: Normal rate, regular rhythm. Grossly normal heart sounds.  Good peripheral circulation. Respiratory: Mildly increased work of breathing, fainting respiratory wheeze, excellent air movement. Gastrointestinal: Soft and nontender. No distention. No CVA tenderness. Genitourinary: Deferred Musculoskeletal: No lower extremity tenderness nor edema.  No joint effusions. Neurologic:  Normal speech and language. No gross focal neurologic deficits are appreciated.  Skin:  Skin is warm, dry and intact. No rash noted. Psychiatric: Mood and affect are normal. Speech and behavior are normal.  ____________________________________________   LABS (all labs ordered are listed, but only abnormal results are displayed)  Labs Reviewed  COMPREHENSIVE METABOLIC PANEL - Abnormal; Notable for the following:    Creatinine, Ser 1.04 (*)    GFR calc non Af Amer 58 (*)    All other components within normal limits  BLOOD GAS, ARTERIAL - Abnormal; Notable for the following:    pCO2 arterial 58 (*)    pO2, Arterial 44 (*)    Bicarbonate 32.8 (*)    Acid-Base Excess 5.8 (*)    All other components within normal limits  CULTURE, BLOOD (ROUTINE X 2)  CULTURE, BLOOD (ROUTINE X 2)  CBC WITH DIFFERENTIAL/PLATELET  TROPONIN I  BRAIN NATRIURETIC PEPTIDE   ____________________________________________  EKG  ED ECG REPORT I, Gayla Doss, the attending physician, personally viewed and interpreted this ECG.   Date: 03/30/2015  EKG Time: 15:01  Rate: 98  Rhythm: sinus rhythm  Axis: normal  Intervals:none  ST&T Change: No acute ST elevation. Borderline T-wave  abnormalities in the inferior leads. Unchanged from 03/22/2015.  ____________________________________________  RADIOLOGY  CXR IMPRESSION: No active disease.  ____________________________________________   PROCEDURES  Procedure(s) performed: None  Critical Care performed: No  ____________________________________________   INITIAL IMPRESSION / ASSESSMENT AND PLAN / ED COURSE  Pertinent labs & imaging results that were available during my care of the patient were reviewed by me and considered in my medical decision making (see chart for details).  Julie Foley is a 59 y.o. female with history of CHF, COPD, hypertension, anxiety who presents for evaluation of chest pain and shortness of breath. On exam, she is very well-appearing. In fact, she is familiar to me, I have taken care of her several times, and this is the best I have seen her look with regards to her respiratory status. We'll trial her off BiPAP, give DuoNeb, steroids, obtain screening cardiac labs and chest x-ray.  ----------------------------------------- 5:19 PM on 03/30/2015 -----------------------------------------  Patient continues to look well off of BiPAP. O2 sat 100% on her chronic 4 L home oxygen requirement. She is requesting discharge stating that she feels well. Her chest pain has resolved. Troponin negative and it was drawn greater than 3 hours after the onset of her pain. Doubt ACS. EKG unchanged. Not consistent with PE or acute aortic dissection. The remainder of her labs reviewed and are generally unremarkable. A VBG was obtained accidentally instead of an ABG with reassuring pH. Discussed return precautions, need for close PCP follow-up and she is comfortable with the discharge plan. DC home. ____________________________________________   FINAL CLINICAL IMPRESSION(S) / ED  DIAGNOSES  Final diagnoses:  Chest pain, unspecified chest pain type  Chronic obstructive pulmonary disease, unspecified COPD  type (HCC)      Gayla DossEryka A Sotirios Navarro, MD 03/30/15 [phone]

## 2015-04-05 LAB — CULTURE, BLOOD (ROUTINE X 2)
CULTURE: NO GROWTH
Culture: NO GROWTH

## 2015-04-09 ENCOUNTER — Emergency Department: Payer: Medicaid Other

## 2015-04-09 ENCOUNTER — Encounter: Payer: Self-pay | Admitting: Emergency Medicine

## 2015-04-09 ENCOUNTER — Emergency Department
Admission: EM | Admit: 2015-04-09 | Discharge: 2015-04-09 | Discharge status: Against Medical Advice | Payer: Medicaid Other | Attending: Emergency Medicine | Admitting: Emergency Medicine

## 2015-04-09 DIAGNOSIS — J441 Chronic obstructive pulmonary disease with (acute) exacerbation: Secondary | ICD-10-CM | POA: Insufficient documentation

## 2015-04-09 DIAGNOSIS — Z7951 Long term (current) use of inhaled steroids: Secondary | ICD-10-CM | POA: Diagnosis not present

## 2015-04-09 DIAGNOSIS — I509 Heart failure, unspecified: Secondary | ICD-10-CM | POA: Diagnosis not present

## 2015-04-09 DIAGNOSIS — Z792 Long term (current) use of antibiotics: Secondary | ICD-10-CM | POA: Diagnosis not present

## 2015-04-09 DIAGNOSIS — F419 Anxiety disorder, unspecified: Secondary | ICD-10-CM | POA: Insufficient documentation

## 2015-04-09 DIAGNOSIS — J9601 Acute respiratory failure with hypoxia: Secondary | ICD-10-CM | POA: Diagnosis not present

## 2015-04-09 DIAGNOSIS — I159 Secondary hypertension, unspecified: Secondary | ICD-10-CM

## 2015-04-09 DIAGNOSIS — Z87891 Personal history of nicotine dependence: Secondary | ICD-10-CM | POA: Insufficient documentation

## 2015-04-09 DIAGNOSIS — R0602 Shortness of breath: Secondary | ICD-10-CM | POA: Diagnosis present

## 2015-04-09 DIAGNOSIS — Z79899 Other long term (current) drug therapy: Secondary | ICD-10-CM | POA: Diagnosis not present

## 2015-04-09 LAB — BASIC METABOLIC PANEL
ANION GAP: 9 (ref 5–15)
BUN: 13 mg/dL (ref 6–20)
CALCIUM: 9.5 mg/dL (ref 8.9–10.3)
CHLORIDE: 106 mmol/L (ref 101–111)
CO2: 29 mmol/L (ref 22–32)
Creatinine, Ser: 0.69 mg/dL (ref 0.44–1.00)
GFR calc Af Amer: >60 mL/min (ref >60)
GFR calc non Af Amer: >60 mL/min (ref >60)
GLUCOSE: 132 mg/dL — AB (ref 65–99)
POTASSIUM: 4.3 mmol/L (ref 3.5–5.1)
Sodium: 144 mmol/L (ref 135–145)

## 2015-04-09 LAB — CBC WITH DIFFERENTIAL/PLATELET
BASOS ABS: 0.1 10*3/uL (ref 0–0.1)
Basophils Relative: 1 %
Eosinophils Absolute: 0.3 10*3/uL (ref 0–0.7)
Eosinophils Relative: 2 %
HEMATOCRIT: 37.9 % (ref 35.0–47.0)
HEMOGLOBIN: 12.6 g/dL (ref 12.0–16.0)
Lymphs Abs: 0.7 10*3/uL — ABNORMAL LOW (ref 1.0–3.6)
MCH: 32.8 pg (ref 26.0–34.0)
MCHC: 33.2 g/dL (ref 32.0–36.0)
MCV: 98.7 fL (ref 80.0–100.0)
MONO ABS: 0.4 10*3/uL (ref 0.2–0.9)
Monocytes Relative: 3 %
NEUTROS ABS: 13.8 10*3/uL — AB (ref 1.4–6.5)
Platelets: 354 10*3/uL (ref 150–440)
RBC: 3.84 MIL/uL (ref 3.80–5.20)
RDW: 13.3 % (ref 11.5–14.5)
WBC: 15.3 10*3/uL — ABNORMAL HIGH (ref 3.6–11.0)

## 2015-04-09 LAB — TROPONIN I: Troponin I: <0.03 ng/mL (ref <0.031)

## 2015-04-09 LAB — BRAIN NATRIURETIC PEPTIDE: B NATRIURETIC PEPTIDE 5: 49 pg/mL (ref 0.0–100.0)

## 2015-04-09 MED ORDER — SODIUM CHLORIDE 0.9 % IV BOLUS (SEPSIS)
500.0000 mL | Freq: Once | INTRAVENOUS | Status: DC
Start: 2015-04-09 — End: 2015-04-09

## 2015-04-09 MED ORDER — IPRATROPIUM-ALBUTEROL 0.5-2.5 (3) MG/3ML IN SOLN
3.0000 mL | Freq: Once | RESPIRATORY_TRACT | Status: AC
  Administered 2015-04-09: 3 mL via RESPIRATORY_TRACT

## 2015-04-09 MED ORDER — LORAZEPAM 2 MG/ML IJ SOLN: 0.5000 mg | Freq: Once | INTRAMUSCULAR | Status: DC

## 2015-04-09 MED ORDER — METHYLPREDNISOLONE SODIUM SUCC 125 MG IJ SOLR
125.0000 mg | Freq: Once | INTRAMUSCULAR | Status: DC
  Filled 2015-04-09: qty 2

## 2015-04-09 MED ORDER — LORAZEPAM 2 MG/ML IJ SOLN
INTRAMUSCULAR | Status: AC
  Filled 2015-04-09: qty 1

## 2015-04-09 MED ORDER — METHYLPREDNISOLONE SODIUM SUCC 125 MG IJ SOLR
125.0000 mg | Freq: Once | INTRAMUSCULAR | Status: AC
  Administered 2015-04-09: 125 mg via INTRAVENOUS

## 2015-04-09 MED ORDER — LORAZEPAM 0.5 MG PO TABS: 1.0000 mg | ORAL_TABLET | Freq: Three times a day (TID) | ORAL | Status: DC | PRN

## 2015-04-09 MED ORDER — PREDNISONE 20 MG PO TABS: ORAL_TABLET | ORAL | Status: DC

## 2015-04-09 MED ORDER — IPRATROPIUM-ALBUTEROL 0.5-2.5 (3) MG/3ML IN SOLN
RESPIRATORY_TRACT | Status: AC
  Filled 2015-04-09: qty 9

## 2015-04-09 MED ORDER — METHYLPREDNISOLONE SODIUM SUCC 125 MG IJ SOLR
INTRAMUSCULAR | Status: AC
  Filled 2015-04-09: qty 2

## 2015-04-09 MED ORDER — LORAZEPAM 2 MG/ML IJ SOLN
2.0000 mg | Freq: Once | INTRAMUSCULAR | Status: AC
  Administered 2015-04-09: 2 mg via INTRAMUSCULAR
  Filled 2015-04-09: qty 1

## 2015-04-09 MED ORDER — FUROSEMIDE 10 MG/ML IJ SOLN
40.0000 mg | Freq: Once | INTRAMUSCULAR | Status: AC
  Administered 2015-04-09: 40 mg via INTRAVENOUS
  Filled 2015-04-09: qty 4

## 2015-04-09 NOTE — ED Notes (Signed)
Pt to ed with c/o SOB, presents to ed on Cpap at this time

## 2015-04-09 NOTE — ED Notes (Signed)
Incontinent of urine; changed and clean depend placed on pt; repositioned for comfort

## 2015-04-09 NOTE — Discharge Instructions (Signed)
1. You are leaving AGAINST MEDICAL ADVICE. I am concerned that if you go home, your breathing will get worse. If you get much worse, you may require intubation and need to be on the ventilator. Worse, you could die if your breathing gets worse. 2. Take steroid taper as prescribed (Prednisone 12 day taper). 3. You may take low-dose anxiety medicine as needed (Ativan #15). 4. Return to the ER if you change your mind, for worsening symptoms, persistent vomiting, fever, difficulty breathing or other concerns.  Respiratory failure is when your lungs are not working well and your breathing (respiratory) system fails. When respiratory failure occurs, it is difficult for your lungs to get enough oxygen, get rid of carbon dioxide, or both. Respiratory failure can be life threatening.  Respiratory failure can be acute or chronic. Acute respiratory failure is sudden, severe, and requires emergency medical treatment. Chronic respiratory failure is less severe, happens over time, and requires ongoing treatment.  WHAT ARE THE CAUSES OF ACUTE RESPIRATORY FAILURE?  Any problem affecting the heart or lungs can cause acute respiratory failure. Some of these causes include the following:  Chronic bronchitis and emphysema (COPD).   Blood clot going to a lung (pulmonary embolism).   Having water in the lungs caused by heart failure, lung injury, or infection (pulmonary edema).   Collapsed lung (pneumothorax).   Pneumonia.   Pulmonary fibrosis.   Obesity.   Asthma.   Heart failure.   Any type of trauma to the chest that can make breathing difficult.   Nerve or muscle diseases making chest movements difficult. HOW WILL MY ACUTE RESPIRATORY FAILURE BE TREATED?  Treatment of acute respiratory failure depends on the cause of the respiratory failure. Usually, you will stay in the intensive care unit so your breathing can be watched closely. Treatment can include the following:  Oxygen. Oxygen can be  delivered through the following:  Nasal cannula. This is small tubing that goes in your nose to give you oxygen.  Face mask. A face mask covers your nose and mouth to give you oxygen.  Medicine. Different medicines can be given to help with breathing. These can include:  Nebulizers. Nebulizers deliver medicines to open the air passages (bronchodilators). These medicines help to open or relax the airways in the lungs so you can breathe better. They can also help loosen mucus from your lungs.  Diuretics. Diuretic medicines can help you breathe better by getting rid of extra water in your body.  Steroids. Steroid medicines can help decrease swelling (inflammation) in your lungs.  Antibiotics.  Chest tube. If you have a collapsed lung (pneumothorax), a chest tube is placed to help reinflate the lung.  Noninvasive positive pressure ventilation (NPPV). This is a tight-fitting mask that goes over your nose and mouth. The mask has tubing that is attached to a machine. The machine blows air into the tubing, which helps to keep the tiny air sacs (alveoli) in your lungs open. This machine allows you to breathe on your own.  Ventilator. A ventilator is a breathing machine. When on a ventilator, a breathing tube is put into the lungs. A ventilator is used when you can no longer breathe well enough on your own. You may have low oxygen levels or high carbon dioxide (CO2) levels in your blood. When you are on a ventilator, sedation and pain medicines are given to make you sleep so your lungs can heal. SEEK IMMEDIATE MEDICAL CARE IF:  You have shortness of breath (dyspnea) with or  without activity.  You have rapid breathing (tachypnea).  You are wheezing.  You are unable to say more than a few words without having to catch your breath.  You find it very difficult to function normally.  You have a fast heart rate.  You have a bluish color to your finger or toe nail beds.  You have confusion or  drowsiness or both.   This information is not intended to replace advice given to you by your health care provider. Make sure you discuss any questions you have with your health care provider.   Document Released: 03/22/2013 Document Revised: 12/06/2014 Document Reviewed: 03/22/2013 Elsevier Interactive Patient Education 2016 [employer]  Chronic Obstructive Pulmonary Disease Exacerbation Chronic obstructive pulmonary disease (COPD) is a common lung condition in which airflow from the lungs is limited. COPD is a general term that can be used to describe many different lung problems that limit airflow, including chronic bronchitis and emphysema. COPD exacerbations are episodes when breathing symptoms become much worse and require extra treatment. Without treatment, COPD exacerbations can be life threatening, and frequent COPD exacerbations can cause further damage to your lungs. CAUSES  Respiratory infections.  Exposure to smoke.  Exposure to air pollution, chemical fumes, or dust. Sometimes there is no apparent cause or trigger. RISK FACTORS  Smoking cigarettes.  Older age.  Frequent prior COPD exacerbations. SIGNS AND SYMPTOMS  Increased coughing.  Increased thick spit (sputum) production.  Increased wheezing.  Increased shortness of breath.  Rapid breathing.  Chest tightness. DIAGNOSIS Your medical history, a physical exam, and tests will help your health care provider make a diagnosis. Tests may include:  A chest X-ray.  Basic lab tests.  Sputum testing.  An arterial blood gas test. TREATMENT Depending on the severity of your COPD exacerbation, you may need to be admitted to a hospital for treatment. Some of the treatments commonly used to treat COPD exacerbations are:   Antibiotic medicines.  Bronchodilators. These are drugs that expand the air passages. They may be given with an inhaler or nebulizer. Spacer devices may be needed to help improve drug  delivery.  Corticosteroid medicines.  Supplemental oxygen therapy.  Airway clearing techniques, such as noninvasive ventilation (NIV) and positive expiratory pressure (PEP). These provide respiratory support through a mask or other noninvasive device. HOME CARE INSTRUCTIONS  Do not smoke. Quitting smoking is very important to prevent COPD from getting worse and exacerbations from happening as often.  Avoid exposure to all substances that irritate the airway, especially to tobacco smoke.  If you were prescribed an antibiotic medicine, finish it all even if you start to feel better.  Take all medicines as directed by your health care provider.It is important to use correct technique with inhaled medicines.  Drink enough fluids to keep your urine clear or pale yellow (unless you have a medical condition that requires fluid restriction).  Use a cool mist vaporizer. This makes it easier to clear your chest when you cough.  If you have a home nebulizer and oxygen, continue to use them as directed.  Maintain all necessary vaccinations to prevent infections.  Exercise regularly.  Eat a healthy diet.  Keep all follow-up appointments as directed by your health care provider. SEEK IMMEDIATE MEDICAL CARE IF:  You have worsening shortness of breath.  You have trouble talking.  You have severe chest pain.  You have blood in your sputum.  You have a fever.  You have weakness, vomit repeatedly, or faint.  You feel confused.  You continue to get worse. MAKE SURE YOU:  Understand these instructions.  Will watch your condition.  Will get help right away if you are not doing well or get worse.   This information is not intended to replace advice given to you by your health care provider. Make sure you discuss any questions you have with your health care provider.   Document Released: 01/12/2007 Document Revised: 04/07/2014 Document Reviewed: 11/19/2012 Elsevier Interactive  Patient Education 2016 [employer]  Heart Failure Heart failure is a condition in which the heart has trouble pumping blood. This means your heart does not pump blood efficiently for your body to work well. In some cases of heart failure, fluid may back up into your lungs or you may have swelling (edema) in your lower legs. Heart failure is usually a long-term (chronic) condition. It is important for you to take good care of yourself and follow your health care provider's treatment plan. CAUSES  Some health conditions can cause heart failure. Those health conditions include:  High blood pressure (hypertension). Hypertension causes the heart muscle to work harder than normal. When pressure in the blood vessels is high, the heart needs to pump (contract) with more force in order to circulate blood throughout the body. High blood pressure eventually causes the heart to become stiff and weak.  Coronary artery disease (CAD). CAD is the buildup of cholesterol and fat (plaque) in the arteries of the heart. The blockage in the arteries deprives the heart muscle of oxygen and blood. This can cause chest pain and may lead to a heart attack. High blood pressure can also contribute to CAD.  Heart attack (myocardial infarction). A heart attack occurs when one or more arteries in the heart become blocked. The loss of oxygen damages the muscle tissue of the heart. When this happens, part of the heart muscle dies. The injured tissue does not contract as well and weakens the heart's ability to pump blood.  Abnormal heart valves. When the heart valves do not open and close properly, it can cause heart failure. This makes the heart muscle pump harder to keep the blood flowing.  Heart muscle disease (cardiomyopathy or myocarditis). Heart muscle disease is damage to the heart muscle from a variety of causes. These can include drug or alcohol abuse, infections, or unknown reasons. These can increase the risk of heart  failure.  Lung disease. Lung disease makes the heart work harder because the lungs do not work properly. This can cause a strain on the heart, leading it to fail.  Diabetes. Diabetes increases the risk of heart failure. High blood sugar contributes to high fat (lipid) levels in the blood. Diabetes can also cause slow damage to tiny blood vessels that carry important nutrients to the heart muscle. When the heart does not get enough oxygen and food, it can cause the heart to become weak and stiff. This leads to a heart that does not contract efficiently.  Other conditions can contribute to heart failure. These include abnormal heart rhythms, thyroid problems, and low blood counts (anemia). Certain unhealthy behaviors can increase the risk of heart failure, including:  Being overweight.  Smoking or chewing tobacco.  Eating foods high in fat and cholesterol.  Abusing illicit drugs or alcohol.  Lacking physical activity. SYMPTOMS  Heart failure symptoms may vary and can be hard to detect. Symptoms may include:  Shortness of breath with activity, such as climbing stairs.  Persistent cough.  Swelling of the feet, ankles, legs, or  abdomen.  Unexplained weight gain.  Difficulty breathing when lying flat (orthopnea).  Waking from sleep because of the need to sit up and get more air.  Rapid heartbeat.  Fatigue and loss of energy.  Feeling light-headed, dizzy, or close to fainting.  Loss of appetite.  Nausea.  Increased urination during the night (nocturia). DIAGNOSIS  A diagnosis of heart failure is based on your history, symptoms, physical examination, and diagnostic tests. Diagnostic tests for heart failure may include:  Echocardiography.  Electrocardiography.  Chest X-ray.  Blood tests.  Exercise stress test.  Cardiac angiography.  Radionuclide scans. TREATMENT  Treatment is aimed at managing the symptoms of heart failure. Medicines, behavioral changes, or  surgical intervention may be necessary to treat heart failure.  Medicines to help treat heart failure may include:  Angiotensin-converting enzyme (ACE) inhibitors. This type of medicine blocks the effects of a blood protein called angiotensin-converting enzyme. ACE inhibitors relax (dilate) the blood vessels and help lower blood pressure.  Angiotensin receptor blockers (ARBs). This type of medicine blocks the actions of a blood protein called angiotensin. Angiotensin receptor blockers dilate the blood vessels and help lower blood pressure.  Water pills (diuretics). Diuretics cause the kidneys to remove salt and water from the blood. The extra fluid is removed through urination. This loss of extra fluid lowers the volume of blood the heart pumps.  Beta blockers. These prevent the heart from beating too fast and improve heart muscle strength.  Digitalis. This increases the force of the heartbeat.  Healthy behavior changes include:  Obtaining and maintaining a healthy weight.  Stopping smoking or chewing tobacco.  Eating heart-healthy foods.  Limiting or avoiding alcohol.  Stopping illicit drug use.  Physical activity as directed by your health care provider.  Surgical treatment for heart failure may include:  A procedure to open blocked arteries, repair damaged heart valves, or remove damaged heart muscle tissue.  A pacemaker to improve heart muscle function and control certain abnormal heart rhythms.  An internal cardioverter defibrillator to treat certain serious abnormal heart rhythms.  A left ventricular assist device (LVAD) to assist the pumping ability of the heart. HOME CARE INSTRUCTIONS   Take medicines only as directed by your health care provider. Medicines are important in reducing the workload of your heart, slowing the progression of heart failure, and improving your symptoms.  Do not stop taking your medicine unless directed by your health care provider.  Do not  skip any dose of medicine.  Refill your prescriptions before you run out of medicine. Your medicines are needed every day.  Engage in moderate physical activity if directed by your health care provider. Moderate physical activity can benefit some people. The elderly and people with severe heart failure should consult with a health care provider for physical activity recommendations.  Eat heart-healthy foods. Food choices should be free of trans fat and low in saturated fat, cholesterol, and salt (sodium). Healthy choices include fresh or frozen fruits and vegetables, fish, lean meats, legumes, fat-free or low-fat dairy products, and whole grain or high fiber foods. Talk to a dietitian to learn more about heart-healthy foods.  Limit sodium if directed by your health care provider. Sodium restriction may reduce symptoms of heart failure in some people. Talk to a dietitian to learn more about heart-healthy seasonings.  Use healthy cooking methods. Healthy cooking methods include roasting, grilling, broiling, baking, poaching, steaming, or stir-frying. Talk to a dietitian to learn more about healthy cooking methods.  Limit fluids if directed  by your health care provider. Fluid restriction may reduce symptoms of heart failure in some people.  Weigh yourself every day. Daily weights are important in the early recognition of excess fluid. You should weigh yourself every morning after you urinate and before you eat breakfast. Wear the same amount of clothing each time you weigh yourself. Record your daily weight. Provide your health care provider with your weight record.  Monitor and record your blood pressure if directed by your health care provider.  Check your pulse if directed by your health care provider.  Lose weight if directed by your health care provider. Weight loss may reduce symptoms of heart failure in some people.  Stop smoking or chewing tobacco. Nicotine makes your heart work harder by  causing your blood vessels to constrict. Do not use nicotine gum or patches before talking to your health care provider.  Keep all follow-up visits as directed by your health care provider. This is important.  Limit alcohol intake to no more than 1 drink per day for nonpregnant women and 2 drinks per day for men. One drink equals 12 ounces of beer, 5 ounces of wine, or 1 ounces of hard liquor. Drinking more than that is harmful to your heart. Tell your health care provider if you drink alcohol several times a week. Talk with your health care provider about whether alcohol is safe for you. If your heart has already been damaged by alcohol or you have severe heart failure, drinking alcohol should be stopped completely.  Stop illicit drug use.  Stay up-to-date with immunizations. It is especially important to prevent respiratory infections through current pneumococcal and influenza immunizations.  Manage other health conditions such as hypertension, diabetes, thyroid disease, or abnormal heart rhythms as directed by your health care provider.  Learn to manage stress.  Plan rest periods when fatigued.  Learn strategies to manage high temperatures. If the weather is extremely hot:  Avoid vigorous physical activity.  Use air conditioning or fans or seek a cooler location.  Avoid caffeine and alcohol.  Wear loose-fitting, lightweight, and light-colored clothing.  Learn strategies to manage cold temperatures. If the weather is extremely cold:  Avoid vigorous physical activity.  Layer clothes.  Wear mittens or gloves, a hat, and a scarf when going outside.  Avoid alcohol.  Obtain ongoing education and support as needed.  Participate in or seek rehabilitation as needed to maintain or improve independence and quality of life. SEEK MEDICAL CARE IF:   You have a rapid weight gain.  You have increasing shortness of breath that is unusual for you.  You are unable to participate in your  usual physical activities.  You tire easily.  You cough more than normal, especially with physical activity.  You have any or more swelling in areas such as your hands, feet, ankles, or abdomen.  You are unable to sleep because it is hard to breathe.  You feel like your heart is beating fast (palpitations).  You become dizzy or light-headed upon standing up. SEEK IMMEDIATE MEDICAL CARE IF:   You have difficulty breathing.  There is a change in mental status such as decreased alertness or difficulty with concentration.  You have a pain or discomfort in your chest.  You have an episode of fainting (syncope). MAKE SURE YOU:   Understand these instructions.  Will watch your condition.  Will get help right away if you are not doing well or get worse.   This information is not intended to replace  advice given to you by your health care provider. Make sure you discuss any questions you have with your health care provider.   Document Released: 03/17/2005 Document Revised: 08/01/2014 Document Reviewed: 04/16/2012 Elsevier Interactive Patient Education 2016 [employer].  Hypertension Hypertension, commonly called high blood pressure, is when the force of blood pumping through your arteries is too strong. Your arteries are the blood vessels that carry blood from your heart throughout your body. A blood pressure reading consists of a higher number over a lower number, such as 110/72. The higher number (systolic) is the pressure inside your arteries when your heart pumps. The lower number (diastolic) is the pressure inside your arteries when your heart relaxes. Ideally you want your blood pressure below 120/80. Hypertension forces your heart to work harder to pump blood. Your arteries may become narrow or stiff. Having untreated or uncontrolled hypertension can cause heart attack, stroke, kidney disease, and other problems. RISK FACTORS Some risk factors for high blood pressure are  controllable. Others are not.  Risk factors you cannot control include:   Race. You may be at higher risk if you are African American.  Age. Risk increases with age.  Gender. Men are at higher risk than women before age 52 years. After age 81, women are at higher risk than men. Risk factors you can control include:  Not getting enough exercise or physical activity.  Being overweight.  Getting too much fat, sugar, calories, or salt in your diet.  Drinking too much alcohol. SIGNS AND SYMPTOMS Hypertension does not usually cause signs or symptoms. Extremely high blood pressure (hypertensive crisis) may cause headache, anxiety, shortness of breath, and nosebleed. DIAGNOSIS To check if you have hypertension, your health care provider will measure your blood pressure while you are seated, with your arm held at the level of your heart. It should be measured at least twice using the same arm. Certain conditions can cause a difference in blood pressure between your right and left arms. A blood pressure reading that is higher than normal on one occasion does not mean that you need treatment. If it is not clear whether you have high blood pressure, you may be asked to return on a different day to have your blood pressure checked again. Or, you may be asked to monitor your blood pressure at home for 1 or more weeks. TREATMENT Treating high blood pressure includes making lifestyle changes and possibly taking medicine. Living a healthy lifestyle can help lower high blood pressure. You may need to change some of your habits. Lifestyle changes may include:  Following the DASH diet. This diet is high in fruits, vegetables, and whole grains. It is low in salt, red meat, and added sugars.  Keep your sodium intake below 2,300 mg per day.  Getting at least 30-45 minutes of aerobic exercise at least 4 times per week.  Losing weight if necessary.  Not smoking.  Limiting alcoholic beverages.  Learning  ways to reduce stress. Your health care provider may prescribe medicine if lifestyle changes are not enough to get your blood pressure under control, and if one of the following is true:  You are 75-42 years of age and your systolic blood pressure is above 140.  You are 38 years of age or older, and your systolic blood pressure is above 150.  Your diastolic blood pressure is above 90.  You have diabetes, and your systolic blood pressure is over 140 or your diastolic blood pressure is over 90.  You  have kidney disease and your blood pressure is above 140/90.  You have heart disease and your blood pressure is above 140/90. Your personal target blood pressure may vary depending on your medical conditions, your age, and other factors. HOME CARE INSTRUCTIONS  Have your blood pressure rechecked as directed by your health care provider.   Take medicines only as directed by your health care provider. Follow the directions carefully. Blood pressure medicines must be taken as prescribed. The medicine does not work as well when you skip doses. Skipping doses also puts you at risk for problems.  Do not smoke.   Monitor your blood pressure at home as directed by your health care provider. SEEK MEDICAL CARE IF:   You think you are having a reaction to medicines taken.  You have recurrent headaches or feel dizzy.  You have swelling in your ankles.  You have trouble with your vision. SEEK IMMEDIATE MEDICAL CARE IF:  You develop a severe headache or confusion.  You have unusual weakness, numbness, or feel faint.  You have severe chest or abdominal pain.  You vomit repeatedly.  You have trouble breathing. MAKE SURE YOU:   Understand these instructions.  Will watch your condition.  Will get help right away if you are not doing well or get worse.   This information is not intended to replace advice given to you by your health care provider. Make sure you discuss any questions you  have with your health care provider.   Document Released: 03/17/2005 Document Revised: 08/01/2014 Document Reviewed: 01/07/2013 Elsevier Interactive Patient Education [employer]

## 2015-04-09 NOTE — ED Notes (Addendum)
IV attempt x 2; pt tolerated well; 2nd attempt able to draw blood but infiltrated when flushing; line removed and pressure held to site;

## 2015-04-09 NOTE — Progress Notes (Signed)
Pt removed Bipap and will not wear until she gets something to drink. RN aware

## 2015-04-09 NOTE — ED Notes (Signed)
Pt is refusing to be admitted if MD suggests so; adamant about going home;

## 2015-04-09 NOTE — ED Notes (Signed)
Dr Sung in to follow up 
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
## 2015-04-09 NOTE — ED Provider Notes (Addendum)
[facility] Emergency Department Provider Note  ____________________________________________  Time seen: Approximately 1:24 AM  I have reviewed the triage vital signs and the nursing notes.   HISTORY  Chief Complaint Shortness of breath   HPI Julie Foley is a 60 y.o. female who presents to the ED from home via EMS with a chief complaint of shortness of breath. Patient has a history of COPD on home oxygen with multiple frequent visits to the ED for same. Complains of a one-day history of progressive shortness of breath not alleviated by her home nebulizer treatments. Complains of associated chest tightness and anxiety. Denies recent travel or trauma. Denies fever, chills, abdominal pain, nausea, vomiting, diarrhea. Nothing makes her symptoms better or worse.   Past Medical History  Diagnosis Date  . COPD (chronic obstructive pulmonary disease) (HCC)   . CHF (congestive heart failure) (HCC)   . HTN (hypertension)   . GERD (gastroesophageal reflux disease)   . Depression   . Anxiety     Patient Active Problem List   Diagnosis Date Noted  . Sepsis (HCC) 02/16/2015  . COPD with acute exacerbation (HCC) 01/25/2015  . Acute respiratory failure with hypoxemia (HCC) 10/07/2014  . Anxiety 10/07/2014  . HTN (hypertension) 09/23/2014  . GERD (gastroesophageal reflux disease) 09/23/2014  . CHF (congestive heart failure) (HCC) 09/23/2014  . COPD exacerbation (HCC) 08/11/2014    Past Surgical History  Procedure Laterality Date  . Tubal ligation Bilateral     Current Outpatient Rx  Name  Route  Sig  Dispense  Refill  . albuterol (PROVENTIL HFA;VENTOLIN HFA) 108 (90 BASE) MCG/ACT inhaler   Inhalation   Inhale 2 puffs into the lungs every 4 (four) hours as needed for wheezing or shortness of breath.         Marland Kitchen. albuterol (PROVENTIL) (2.5 MG/3ML) 0.083% nebulizer solution   Nebulization   Take 3 mLs (2.5 mg total) by nebulization every 4 (four) hours as  needed for wheezing or shortness of breath.   75 mL   2   . amLODipine (NORVASC) 5 MG tablet   Oral   Take 5 mg by mouth daily.         Marland Kitchen. azithromycin (ZITHROMAX) 250 MG tablet   Oral   Take 250 mg by mouth daily.         . beclomethasone (QVAR) 80 MCG/ACT inhaler   Inhalation   Inhale 1 puff into the lungs 2 (two) times daily.         . carvedilol (COREG) 12.5 MG tablet   Oral   Take 1 tablet (12.5 mg total) by mouth 2 (two) times daily with a meal.   30 tablet   0   . enalapril (VASOTEC) 20 MG tablet   Oral   Take 20 mg by mouth 2 (two) times daily.          . famotidine (PEPCID) 20 MG tablet   Oral   Take 20 mg by mouth 2 (two) times daily.         . fluticasone (FLONASE) 50 MCG/ACT nasal spray   Each Nare   Place 1 spray into both nostrils 2 (two) times daily.          . Fluticasone-Salmeterol (ADVAIR) 500-50 MCG/DOSE AEPB   Inhalation   Inhale 1 puff into the lungs 2 (two) times daily.         . hydrochlorothiazide (HYDRODIURIL) 25 MG tablet   Oral   Take 25 mg by  mouth daily.         Marland Kitchen ipratropium (ATROVENT) 0.02 % nebulizer solution   Nebulization   Take 0.5 mg by nebulization 4 (four) times daily as needed for wheezing or shortness of breath.          Marland Kitchen ipratropium-albuterol (DUONEB) 0.5-2.5 (3) MG/3ML SOLN   Inhalation   Inhale 3 mLs into the lungs every 4 (four) hours as needed (wheezing/ shortness of breath).          Marland Kitchen ipratropium-albuterol (DUONEB) 0.5-2.5 (3) MG/3ML SOLN   Nebulization   Take 3 mLs by nebulization every 6 (six) hours as needed.   36 mL   0   . levofloxacin (LEVAQUIN) 500 MG tablet   Oral   Take 1 tablet (500 mg total) by mouth daily.   7 tablet   0   . loratadine (CLARITIN) 10 MG tablet   Oral   Take 10 mg by mouth daily.         Marland Kitchen LORazepam (ATIVAN) 0.5 MG tablet   Oral   Take 2 tablets (1 mg total) by mouth every 8 (eight) hours as needed for anxiety.   15 tablet   0   . montelukast  (SINGULAIR) 10 MG tablet   Oral   Take 10 mg by mouth every evening.         . nitroGLYCERIN (NITROSTAT) 0.4 MG SL tablet   Sublingual   Place 0.4 mg under the tongue every 5 (five) minutes as needed for chest pain.         . predniSONE (DELTASONE) 20 MG tablet      Take 3 tablets daily 4 days, then Take 2 tablets daily 4 days, then Take 1 tablet daily 4 days   24 tablet   0   . roflumilast (DALIRESP) 500 MCG TABS tablet   Oral   Take 500 mcg by mouth daily.         Marland Kitchen tiotropium (SPIRIVA) 18 MCG inhalation capsule   Inhalation   Place 18 mcg into inhaler and inhale daily.           Allergies Budesonide-formoterol fumarate  Family History  Problem Relation Age of Onset  . Cancer Mother   . Hypertension Mother   . Diabetes Mother   . Asthma Son     Social History Social History  Substance Use Topics  . Smoking status: Former Smoker -- 2.00 packs/day for 35 years    Types: Cigarettes  . Smokeless tobacco: None  . Alcohol Use: Yes    Review of Systems Constitutional: No fever/chills Eyes: No visual changes. ENT: No sore throat. Cardiovascular: Positive for chest pain. Respiratory: Positive for shortness of breath. Gastrointestinal: No abdominal pain.  No nausea, no vomiting.  No diarrhea.  No constipation. Genitourinary: Negative for dysuria. Musculoskeletal: Negative for back pain. Skin: Negative for rash. Neurological: Negative for headaches, focal weakness or numbness.  10-point ROS otherwise negative.  ____________________________________________   PHYSICAL EXAM:  VITAL SIGNS: ED Triage Vitals  Enc Vitals Group     BP --      Pulse --      Resp --      Temp --      Temp src --      SpO2 --      Weight --      Height --      Head Cir --      Peak Flow --      Pain Score --  Pain Loc --      Pain Edu? --      Excl. in GC? --     Constitutional: Alert and oriented. Ill appearing and in moderate acute distress. Highly  anxious. Eyes: Conjunctivae are normal. PERRL. EOMI. Head: Atraumatic. Nose: No congestion/rhinnorhea. Mouth/Throat: Mucous membranes are moist.  Oropharynx non-erythematous. Neck: No stridor.   Cardiovascular: Tachycardic rate, regular rhythm. Grossly normal heart sounds.  Good peripheral circulation. Respiratory: Increased respiratory effort.  Retractions. Lungs with diffuse wheezing. Gastrointestinal: Soft and nontender. No distention. No abdominal bruits. No CVA tenderness. Musculoskeletal: No lower extremity tenderness nor edema.  No joint effusions. Neurologic:  Normal speech and language. No gross focal neurologic deficits are appreciated. Skin:  Skin is warm, dry and intact. No rash noted. Psychiatric: Mood and affect are normal. Speech and behavior are normal.  ____________________________________________   LABS (all labs ordered are listed, but only abnormal results are displayed)  Labs Reviewed  CBC WITH DIFFERENTIAL/PLATELET - Abnormal; Notable for the following:    WBC 15.3 (*)    Neutro Abs 13.8 (*)    Lymphs Abs 0.7 (*)    All other components within normal limits  BASIC METABOLIC PANEL - Abnormal; Notable for the following:    Glucose, Bld 132 (*)    All other components within normal limits  TROPONIN I  BRAIN NATRIURETIC PEPTIDE   ____________________________________________  EKG  ED ECG REPORT I, Sharline Lehane J, the attending physician, personally viewed and interpreted this ECG.   Date: 04/09/2015  EKG Time: 0158  Rate: 142  Rhythm: sinus tachycardia  Axis: Normal  Intervals:none  ST&T Change: Nonspecific  ____________________________________________  RADIOLOGY  Portable chest x-ray (viewed by me, interpreted per Dr. Manus Gunning): Mild enlargement of cardiac silhouette with small pleural effusions. Findings concerning for fluid overload. ____________________________________________   PROCEDURES  Procedure(s) performed: None  Critical Care  performed: Yes, see critical care note(s)   CRITICAL CARE Performed by: Irean Hong   Total critical care time: 30 minutes  Critical care time was exclusive of separately billable procedures and treating other patients.  Critical care was necessary to treat or prevent imminent or life-threatening deterioration.  Critical care was time spent personally by me on the following activities: development of treatment plan with patient and/or surrogate as well as nursing, discussions with consultants, evaluation of patient's response to treatment, examination of patient, obtaining history from patient or surrogate, ordering and performing treatments and interventions, ordering and review of laboratory studies, ordering and review of radiographic studies, pulse oximetry and re-evaluation of patient's condition.  ____________________________________________   INITIAL IMPRESSION / ASSESSMENT AND PLAN / ED COURSE  Pertinent labs & imaging results that were available during my care of the patient were reviewed by me and considered in my medical decision making (see chart for details).  60 year old female with a history of COPD on home oxygen with multiple ED visits for COPD exacerbation, most recently on December 30. She arrives via EMS on CPAP; sats obtain at her home in the mid 70%s. She is transferred to our BiPAP machine immediately after her arrival. 3 stacked DuoNeb's administered, IV Solu-Medrol and low-dose Ativan ordered for her anxiety. Will obtain screening lab work, EKG, chest x-ray and reassess.  ----------------------------------------- 2:17 AM on 04/09/2015 -----------------------------------------  IM Ativan was administered secondary to patient uncooperative with nurses trying to start an IV. Patient remains on BiPAP with some improvement in her respiratory rate.  ----------------------------------------- 4:15 AM on 04/09/2015 -----------------------------------------  Heart  rate decreased to 118.  IV established. Patient receiving IV Solu-Medrol and IV Lasix. She is already telling me that she will not stay for hospital admission. Will continue to monitor in the ED.  ----------------------------------------- 6:39 AM on 04/09/2015 -----------------------------------------  Patient removed herself from BiPAP. She was placed on 4 L continuous oxygen which she uses at home. Lungs have good aeration with minimal wheezing. With exertion heart rate increases to 125. Extensive discussion with patient regarding need for hospitalization. She adamantly refuses to stay. She acknowledges the risk of her leaving including worsening respiratory distress, need for intubation and death. I will discharge her on a prednisone taper. Limited prescription for Ativan given at patient's request. Strict return precautions given. I strongly encouraged her to change her mind or to return at once if/when her symptoms recur. Patient verbalizes understanding and desires to proceed with leaving AGAINST MEDICAL ADVICE. ____________________________________________   FINAL CLINICAL IMPRESSION(S) / ED DIAGNOSES  Final diagnoses:  COPD with acute exacerbation (HCC)  Acute respiratory failure with hypoxemia (HCC)  Anxiety  Secondary hypertension, unspecified  Acute on chronic congestive heart failure, unspecified congestive heart failure type [facility])      Irean Hong, MD 04/09/15 1610  Irean Hong, MD 04/09/15 0710

## 2015-04-22 ENCOUNTER — Emergency Department
Admission: EM | Admit: 2015-04-22 | Discharge: 2015-04-22 | Discharge status: Against Medical Advice | Payer: Medicaid Other | Attending: Emergency Medicine | Admitting: Emergency Medicine

## 2015-04-22 ENCOUNTER — Encounter: Payer: Self-pay | Admitting: Emergency Medicine

## 2015-04-22 ENCOUNTER — Emergency Department: Payer: Medicaid Other

## 2015-04-22 DIAGNOSIS — I10 Essential (primary) hypertension: Secondary | ICD-10-CM | POA: Insufficient documentation

## 2015-04-22 DIAGNOSIS — Z7951 Long term (current) use of inhaled steroids: Secondary | ICD-10-CM | POA: Diagnosis not present

## 2015-04-22 DIAGNOSIS — Z792 Long term (current) use of antibiotics: Secondary | ICD-10-CM | POA: Insufficient documentation

## 2015-04-22 DIAGNOSIS — R0600 Dyspnea, unspecified: Secondary | ICD-10-CM | POA: Diagnosis present

## 2015-04-22 DIAGNOSIS — R000 Tachycardia, unspecified: Secondary | ICD-10-CM | POA: Diagnosis not present

## 2015-04-22 DIAGNOSIS — F419 Anxiety disorder, unspecified: Secondary | ICD-10-CM | POA: Insufficient documentation

## 2015-04-22 DIAGNOSIS — J441 Chronic obstructive pulmonary disease with (acute) exacerbation: Secondary | ICD-10-CM | POA: Insufficient documentation

## 2015-04-22 DIAGNOSIS — Z87891 Personal history of nicotine dependence: Secondary | ICD-10-CM | POA: Insufficient documentation

## 2015-04-22 DIAGNOSIS — Z79899 Other long term (current) drug therapy: Secondary | ICD-10-CM | POA: Diagnosis not present

## 2015-04-22 LAB — CBC WITH DIFFERENTIAL/PLATELET
Basophils Absolute: 0.1 10*3/uL (ref 0–0.1)
Basophils Relative: 1 %
Eosinophils Absolute: 0.7 10*3/uL (ref 0–0.7)
Eosinophils Relative: 5 %
HCT: 38.2 % (ref 35.0–47.0)
HEMOGLOBIN: 12.3 g/dL (ref 12.0–16.0)
LYMPHS ABS: 1.8 10*3/uL (ref 1.0–3.6)
Lymphocytes Relative: 13 %
MCH: 31.8 pg (ref 26.0–34.0)
MCHC: 32.3 g/dL (ref 32.0–36.0)
MCV: 98.5 fL (ref 80.0–100.0)
MONOS PCT: 7 %
Monocytes Absolute: 0.9 10*3/uL (ref 0.2–0.9)
NEUTROS ABS: 10.2 10*3/uL — AB (ref 1.4–6.5)
NEUTROS PCT: 74 %
Platelets: 302 10*3/uL (ref 150–440)
RBC: 3.88 MIL/uL (ref 3.80–5.20)
RDW: 13.5 % (ref 11.5–14.5)
WBC: 13.7 10*3/uL — ABNORMAL HIGH (ref 3.6–11.0)

## 2015-04-22 LAB — BASIC METABOLIC PANEL
Anion gap: 7 (ref 5–15)
BUN: 19 mg/dL (ref 6–20)
CHLORIDE: 101 mmol/L (ref 101–111)
CO2: 31 mmol/L (ref 22–32)
CREATININE: 0.89 mg/dL (ref 0.44–1.00)
Calcium: 9.4 mg/dL (ref 8.9–10.3)
GFR calc non Af Amer: >60 mL/min (ref >60)
Glucose, Bld: 114 mg/dL — ABNORMAL HIGH (ref 65–99)
POTASSIUM: 4.4 mmol/L (ref 3.5–5.1)
Sodium: 139 mmol/L (ref 135–145)

## 2015-04-22 LAB — BRAIN NATRIURETIC PEPTIDE: B NATRIURETIC PEPTIDE 5: 31 pg/mL (ref 0.0–100.0)

## 2015-04-22 LAB — TROPONIN I: Troponin I: <0.03 ng/mL (ref <0.031)

## 2015-04-22 MED ORDER — IPRATROPIUM-ALBUTEROL 0.5-2.5 (3) MG/3ML IN SOLN
3.0000 mL | Freq: Once | RESPIRATORY_TRACT | Status: AC
  Administered 2015-04-22: 3 mL via RESPIRATORY_TRACT
  Filled 2015-04-22: qty 3

## 2015-04-22 MED ORDER — METHYLPREDNISOLONE SODIUM SUCC 125 MG IJ SOLR
125.0000 mg | Freq: Once | INTRAMUSCULAR | Status: AC
  Administered 2015-04-22: 125 mg via INTRAVENOUS

## 2015-04-22 MED ORDER — LORAZEPAM 1 MG PO TABS
ORAL_TABLET | ORAL | Status: AC
  Administered 2015-04-22: 1 mg via ORAL
  Filled 2015-04-22: qty 1

## 2015-04-22 MED ORDER — PREDNISONE 20 MG PO TABS: ORAL_TABLET | ORAL | Status: DC

## 2015-04-22 MED ORDER — LORAZEPAM 1 MG PO TABS
1.0000 mg | ORAL_TABLET | Freq: Once | ORAL | Status: AC
  Administered 2015-04-22: 1 mg via ORAL

## 2015-04-22 MED ORDER — LORAZEPAM 2 MG/ML IJ SOLN
INTRAMUSCULAR | Status: AC
  Administered 2015-04-22: 0.5 mg via INTRAVENOUS
  Filled 2015-04-22: qty 1

## 2015-04-22 MED ORDER — METHYLPREDNISOLONE SODIUM SUCC 125 MG IJ SOLR
INTRAMUSCULAR | Status: AC
  Administered 2015-04-22: 125 mg via INTRAVENOUS
  Filled 2015-04-22: qty 2

## 2015-04-22 MED ORDER — LORAZEPAM 2 MG/ML IJ SOLN
0.5000 mg | Freq: Once | INTRAMUSCULAR | Status: AC
  Administered 2015-04-22: 0.5 mg via INTRAVENOUS

## 2015-04-22 NOTE — Discharge Instructions (Signed)
1. You are leaving AGAINST MEDICAL ADVICE. Please know you are welcome back at any time if you are feeling worse or change your mind about hospital admission. 2. Take prednisone taper as prescribed. 3. Return to the ER for worsening symptoms, persistent vomiting, difficulty breathing or other concerns.  Chronic Obstructive Pulmonary Disease Exacerbation Chronic obstructive pulmonary disease (COPD) is a common lung condition in which airflow from the lungs is limited. COPD is a general term that can be used to describe many different lung problems that limit airflow, including chronic bronchitis and emphysema. COPD exacerbations are episodes when breathing symptoms become much worse and require extra treatment. Without treatment, COPD exacerbations can be life threatening, and frequent COPD exacerbations can cause further damage to your lungs. CAUSES  Respiratory infections.  Exposure to smoke.  Exposure to air pollution, chemical fumes, or dust. Sometimes there is no apparent cause or trigger. RISK FACTORS  Smoking cigarettes.  Older age.  Frequent prior COPD exacerbations. SIGNS AND SYMPTOMS  Increased coughing.  Increased thick spit (sputum) production.  Increased wheezing.  Increased shortness of breath.  Rapid breathing.  Chest tightness. DIAGNOSIS Your medical history, a physical exam, and tests will help your health care provider make a diagnosis. Tests may include:  A chest X-ray.  Basic lab tests.  Sputum testing.  An arterial blood gas test. TREATMENT Depending on the severity of your COPD exacerbation, you may need to be admitted to a hospital for treatment. Some of the treatments commonly used to treat COPD exacerbations are:   Antibiotic medicines.  Bronchodilators. These are drugs that expand the air passages. They may be given with an inhaler or nebulizer. Spacer devices may be needed to help improve drug delivery.  Corticosteroid  medicines.  Supplemental oxygen therapy.  Airway clearing techniques, such as noninvasive ventilation (NIV) and positive expiratory pressure (PEP). These provide respiratory support through a mask or other noninvasive device. HOME CARE INSTRUCTIONS  Do not smoke. Quitting smoking is very important to prevent COPD from getting worse and exacerbations from happening as often.  Avoid exposure to all substances that irritate the airway, especially to tobacco smoke.  If you were prescribed an antibiotic medicine, finish it all even if you start to feel better.  Take all medicines as directed by your health care provider.It is important to use correct technique with inhaled medicines.  Drink enough fluids to keep your urine clear or pale yellow (unless you have a medical condition that requires fluid restriction).  Use a cool mist vaporizer. This makes it easier to clear your chest when you cough.  If you have a home nebulizer and oxygen, continue to use them as directed.  Maintain all necessary vaccinations to prevent infections.  Exercise regularly.  Eat a healthy diet.  Keep all follow-up appointments as directed by your health care provider. SEEK IMMEDIATE MEDICAL CARE IF:  You have worsening shortness of breath.  You have trouble talking.  You have severe chest pain.  You have blood in your sputum.  You have a fever.  You have weakness, vomit repeatedly, or faint.  You feel confused.  You continue to get worse. MAKE SURE YOU:  Understand these instructions.  Will watch your condition.  Will get help right away if you are not doing well or get worse.   This information is not intended to replace advice given to you by your health care provider. Make sure you discuss any questions you have with your health care provider.   Document  Released: 01/12/2007 Document Revised: 04/07/2014 Document Reviewed: 11/19/2012 Elsevier Interactive Patient Education [employer].

## 2015-04-22 NOTE — ED Notes (Signed)
Patient presents to Emergency Department via EMS with complaints of respiratory distress.  PER EMS 2g Mag Sulfate, 2 x albuterol via CPAP.  Hx COPD exacerbations.

## 2015-04-22 NOTE — ED Notes (Signed)
Pt verbalized understanding of discharge instructions. NAD at this time. 

## 2015-04-22 NOTE — ED Provider Notes (Signed)
[facility] Emergency Department Provider Note  ____________________________________________  Time seen: Approximately 1:13 AM  I have reviewed the triage vital signs and the nursing notes.   HISTORY  Chief Complaint COPD and Respiratory Distress    HPI Julie Foley is a 60 y.o. female who presents to the ED from home via EMS with a chief complaint of respiratory distress. Patient has a history of COPD on continuous oxygen therapy, CHF with numerous prior presentations to the ED for same. Reports increased difficulty breathing today; gave herself numerous breathing treatments prior to arrival. Arrives to the ED on CPAP with magnesium infusing and albuterol nebulizer. Denies fever, chills, chest pain, productive cough, abdominal pain, nausea, vomiting, diarrhea.Denies recent travel or trauma. Nothing makes her symptoms better or worse.   Past Medical History  Diagnosis Date  . COPD (chronic obstructive pulmonary disease) (HCC)   . CHF (congestive heart failure) (HCC)   . HTN (hypertension)   . GERD (gastroesophageal reflux disease)   . Depression   . Anxiety     Patient Active Problem List   Diagnosis Date Noted  . Sepsis (HCC) 02/16/2015  . COPD with acute exacerbation (HCC) 01/25/2015  . Acute respiratory failure with hypoxemia (HCC) 10/07/2014  . Anxiety 10/07/2014  . HTN (hypertension) 09/23/2014  . GERD (gastroesophageal reflux disease) 09/23/2014  . CHF (congestive heart failure) (HCC) 09/23/2014  . COPD exacerbation (HCC) 08/11/2014    Past Surgical History  Procedure Laterality Date  . Tubal ligation Bilateral     Current Outpatient Rx  Name  Route  Sig  Dispense  Refill  . albuterol (PROVENTIL HFA;VENTOLIN HFA) 108 (90 BASE) MCG/ACT inhaler   Inhalation   Inhale 2 puffs into the lungs every 4 (four) hours as needed for wheezing or shortness of breath.         Marland Kitchen albuterol (PROVENTIL) (2.5 MG/3ML) 0.083% nebulizer solution    Nebulization   Take 3 mLs (2.5 mg total) by nebulization every 4 (four) hours as needed for wheezing or shortness of breath.   75 mL   2   . amLODipine (NORVASC) 5 MG tablet   Oral   Take 5 mg by mouth daily.         . beclomethasone (QVAR) 80 MCG/ACT inhaler   Inhalation   Inhale 1 puff into the lungs 2 (two) times daily.         . carvedilol (COREG) 12.5 MG tablet   Oral   Take 1 tablet (12.5 mg total) by mouth 2 (two) times daily with a meal.   30 tablet   0   . enalapril (VASOTEC) 20 MG tablet   Oral   Take 20 mg by mouth 2 (two) times daily.          . famotidine (PEPCID) 20 MG tablet   Oral   Take 20 mg by mouth 2 (two) times daily.         . fluticasone (FLONASE) 50 MCG/ACT nasal spray   Each Nare   Place 1 spray into both nostrils 2 (two) times daily.          . Fluticasone-Salmeterol (ADVAIR) 500-50 MCG/DOSE AEPB   Inhalation   Inhale 1 puff into the lungs 2 (two) times daily.         . hydrochlorothiazide (HYDRODIURIL) 25 MG tablet   Oral   Take 25 mg by mouth daily.         Marland Kitchen ipratropium-albuterol (DUONEB) 0.5-2.5 (3) MG/3ML SOLN  Nebulization   Take 3 mLs by nebulization every 6 (six) hours as needed.   36 mL   0   . loratadine (CLARITIN) 10 MG tablet   Oral   Take 10 mg by mouth daily.         Marland Kitchen LORazepam (ATIVAN) 0.5 MG tablet   Oral   Take 2 tablets (1 mg total) by mouth every 8 (eight) hours as needed for anxiety.   15 tablet   0   . montelukast (SINGULAIR) 10 MG tablet   Oral   Take 10 mg by mouth every evening.         . nitroGLYCERIN (NITROSTAT) 0.4 MG SL tablet   Sublingual   Place 0.4 mg under the tongue every 5 (five) minutes as needed for chest pain.         . roflumilast (DALIRESP) 500 MCG TABS tablet   Oral   Take 500 mcg by mouth daily.         Marland Kitchen tiotropium (SPIRIVA) 18 MCG inhalation capsule   Inhalation   Place 18 mcg into inhaler and inhale daily.         Marland Kitchen levofloxacin (LEVAQUIN) 500 MG  tablet   Oral   Take 1 tablet (500 mg total) by mouth daily. Patient not taking: Reported on 04/22/2015   7 tablet   0   . predniSONE (DELTASONE) 20 MG tablet      Take 3 tablets daily 4 days, then Take 2 tablets daily 4 days, then Take 1 tablet daily 4 days Patient not taking: Reported on 04/22/2015   24 tablet   0     Allergies Budesonide-formoterol fumarate  Family History  Problem Relation Age of Onset  . Cancer Mother   . Hypertension Mother   . Diabetes Mother   . Asthma Son     Social History Social History  Substance Use Topics  . Smoking status: Former Smoker -- 2.00 packs/day for 35 years    Types: Cigarettes  . Smokeless tobacco: None  . Alcohol Use: Yes    Review of Systems Constitutional: No fever/chills Eyes: No visual changes. ENT: No sore throat. Cardiovascular: Denies chest pain. Respiratory: Positive for shortness of breath. Gastrointestinal: No abdominal pain.  No nausea, no vomiting.  No diarrhea.  No constipation. Genitourinary: Negative for dysuria. Musculoskeletal: Negative for back pain. Skin: Negative for rash. Neurological: Negative for headaches, focal weakness or numbness.  10-point ROS otherwise negative.  ____________________________________________   PHYSICAL EXAM:  VITAL SIGNS: ED Triage Vitals  Enc Vitals Group     BP --      Pulse Rate 04/22/15 0108 134     Resp --      Temp 04/22/15 0108 97.6 F (36.4 C)     Temp Source 04/22/15 0108 Axillary     SpO2 04/22/15 0108 100 %     Weight 04/22/15 0108 180 lb (81.647 kg)     Height 04/22/15 0108  (1.575 m)     Head Cir --      Peak Flow --      Pain Score 04/22/15 0109 0     Pain Loc --      Pain Edu? --      Excl. in GC? --     Constitutional: Alert and oriented. Anxious appearing and in moderate acute distress. Eyes: Conjunctivae are normal. PERRL. EOMI. Head: Atraumatic. Nose: No congestion/rhinnorhea. Mouth/Throat: Mucous membranes are moist.   Oropharynx non-erythematous. Neck: No stridor.  Cardiovascular: Tachycardic rate, regular rhythm. Grossly normal heart sounds.  Good peripheral circulation. Respiratory: Increased respiratory effort.  Retractions. Lungs with wheezing diffusely. Gastrointestinal: Soft and nontender. No distention. No abdominal bruits. No CVA tenderness. Musculoskeletal: No lower extremity tenderness nor edema.  No joint effusions. Neurologic:  Normal speech and language. No gross focal neurologic deficits are appreciated. Skin:  Skin is warm, dry and intact. No rash noted. Psychiatric: Mood and affect are normal. Speech and behavior are normal.  ____________________________________________   LABS (all labs ordered are listed, but only abnormal results are displayed)  Labs Reviewed  CBC WITH DIFFERENTIAL/PLATELET - Abnormal; Notable for the following:    WBC 13.7 (*)    Neutro Abs 10.2 (*)    All other components within normal limits  BASIC METABOLIC PANEL - Abnormal; Notable for the following:    Glucose, Bld 114 (*)    All other components within normal limits  TROPONIN I  BRAIN NATRIURETIC PEPTIDE   ____________________________________________  EKG  ED ECG REPORT I, SUNG,JADE J, the attending physician, personally viewed and interpreted this ECG.   Date: 04/22/2015  EKG Time: 0113  Rate: 129  Rhythm: sinus tachycardia  Axis: Normal  Intervals:none  ST&T Change: Nonspecific  ____________________________________________  RADIOLOGY  Portable chest x-ray (viewed by me, interpreted per Dr. Cherly Hensen): Minimal right basilar opacity likely reflects atelectasis. ____________________________________________   PROCEDURES  Procedure(s) performed: None  Critical Care performed: Yes, see critical care note(s)   CRITICAL CARE Performed by: Irean Hong   Total critical care time: 30 minutes  Critical care time was exclusive of separately billable procedures and treating other  patients.  Critical care was necessary to treat or prevent imminent or life-threatening deterioration.  Critical care was time spent personally by me on the following activities: development of treatment plan with patient and/or surrogate as well as nursing, discussions with consultants, evaluation of patient's response to treatment, examination of patient, obtaining history from patient or surrogate, ordering and performing treatments and interventions, ordering and review of laboratory studies, ordering and review of radiographic studies, pulse oximetry and re-evaluation of patient's condition.  ____________________________________________   INITIAL IMPRESSION / ASSESSMENT AND PLAN / ED COURSE  Pertinent labs & imaging results that were available during my care of the patient were reviewed by me and considered in my medical decision making (see chart for details).  60 year old female with a history of COPD on chronic oxygen therapy with numerous prior exacerbations who presents with COPD exacerbation. Patient switched to BiPAP, IV Solu-Medrol and DuoNeb administered. Low-dose Ativan administered for heightened anxiety. Will continue to monitor and reassess. Already tells me she does not wish to stay in the hospital for admission.  ----------------------------------------- 3:00 AM on 04/22/2015 -----------------------------------------  Patient is resting more comfortably. Wheezing diminished. Heart rate improving. Will continue to monitor.  ----------------------------------------- 6:20 AM on 04/22/2015 -----------------------------------------  Patient taken off BiPAP and placed on her 4 L continuous oxygen. She continues to adamantly refuse to be admitted to the hospital, as she has done multiple times previously. Some wheezing has returned. Will give DuoNeb. Patient will leave AGAINST MEDICAL ADVICE. She understands the risks associated with this including worsening condition,  permanent disability and death, and that she is welcome to return at any time. Will write a prescription for prednisone taper. I have advised her that I will not be refilling her prescription for Ativan which I did 2 weeks ago and that she will need to see her PCP to obtain additional Ativan. Strict return  precautions given. Patient verbalizes understanding and agrees with plan of care. ____________________________________________   FINAL CLINICAL IMPRESSION(S) / ED DIAGNOSES  Final diagnoses:  COPD with acute exacerbation [facility])  Anxiety      Irean Hong, MD 04/22/15 0740

## 2015-04-23 ENCOUNTER — Emergency Department
Admission: EM | Admit: 2015-04-23 | Discharge: 2015-04-23 | Discharge status: Against Medical Advice | Payer: Medicaid Other | Attending: Emergency Medicine | Admitting: Emergency Medicine

## 2015-04-23 ENCOUNTER — Emergency Department: Payer: Medicaid Other

## 2015-04-23 ENCOUNTER — Encounter: Payer: Self-pay | Admitting: Emergency Medicine

## 2015-04-23 DIAGNOSIS — Z79899 Other long term (current) drug therapy: Secondary | ICD-10-CM | POA: Insufficient documentation

## 2015-04-23 DIAGNOSIS — J449 Chronic obstructive pulmonary disease, unspecified: Secondary | ICD-10-CM

## 2015-04-23 DIAGNOSIS — I10 Essential (primary) hypertension: Secondary | ICD-10-CM | POA: Diagnosis not present

## 2015-04-23 DIAGNOSIS — J441 Chronic obstructive pulmonary disease with (acute) exacerbation: Secondary | ICD-10-CM | POA: Insufficient documentation

## 2015-04-23 DIAGNOSIS — Z87891 Personal history of nicotine dependence: Secondary | ICD-10-CM | POA: Insufficient documentation

## 2015-04-23 DIAGNOSIS — R0600 Dyspnea, unspecified: Secondary | ICD-10-CM | POA: Diagnosis present

## 2015-04-23 DIAGNOSIS — Z7951 Long term (current) use of inhaled steroids: Secondary | ICD-10-CM | POA: Insufficient documentation

## 2015-04-23 DIAGNOSIS — R000 Tachycardia, unspecified: Secondary | ICD-10-CM | POA: Insufficient documentation

## 2015-04-23 LAB — CBC
HCT: 38.4 % (ref 35.0–47.0)
Hemoglobin: 12.5 g/dL (ref 12.0–16.0)
MCH: 31.5 pg (ref 26.0–34.0)
MCHC: 32.6 g/dL (ref 32.0–36.0)
MCV: 96.5 fL (ref 80.0–100.0)
Platelets: 323 10*3/uL (ref 150–440)
RBC: 3.98 MIL/uL (ref 3.80–5.20)
RDW: 13.1 % (ref 11.5–14.5)
WBC: 9.4 10*3/uL (ref 3.6–11.0)

## 2015-04-23 LAB — COMPREHENSIVE METABOLIC PANEL
ALBUMIN: 4.6 g/dL (ref 3.5–5.0)
ALT: 19 U/L (ref 14–54)
ANION GAP: 5 (ref 5–15)
AST: 20 U/L (ref 15–41)
Alkaline Phosphatase: 63 U/L (ref 38–126)
BILIRUBIN TOTAL: 0.7 mg/dL (ref 0.3–1.2)
BUN: 19 mg/dL (ref 6–20)
CO2: 34 mmol/L — AB (ref 22–32)
Calcium: 9.3 mg/dL (ref 8.9–10.3)
Chloride: 102 mmol/L (ref 101–111)
Creatinine, Ser: 0.81 mg/dL (ref 0.44–1.00)
GFR calc non Af Amer: >60 mL/min (ref >60)
GLUCOSE: 120 mg/dL — AB (ref 65–99)
POTASSIUM: 4.7 mmol/L (ref 3.5–5.1)
SODIUM: 141 mmol/L (ref 135–145)
TOTAL PROTEIN: 7.9 g/dL (ref 6.5–8.1)

## 2015-04-23 LAB — TROPONIN I: Troponin I: <0.03 ng/mL (ref <0.031)

## 2015-04-23 LAB — BLOOD GAS, VENOUS
ACID-BASE EXCESS: 5.8 mmol/L — AB (ref 0.0–3.0)
Bicarbonate: 32.5 mEq/L — ABNORMAL HIGH (ref 21.0–28.0)
DELIVERY SYSTEMS: POSITIVE
FIO2: 0.35
PH VEN: 7.38 (ref 7.320–7.430)
Patient temperature: 37
pCO2, Ven: 55 mmHg (ref 44.0–60.0)

## 2015-04-23 MED ORDER — ALBUTEROL SULFATE (2.5 MG/3ML) 0.083% IN NEBU
INHALATION_SOLUTION | RESPIRATORY_TRACT | Status: AC
  Administered 2015-04-23: 2.5 mg via RESPIRATORY_TRACT
  Filled 2015-04-23: qty 3

## 2015-04-23 MED ORDER — ALBUTEROL SULFATE (2.5 MG/3ML) 0.083% IN NEBU
2.5000 mg | INHALATION_SOLUTION | Freq: Once | RESPIRATORY_TRACT | Status: AC
  Administered 2015-04-23: 2.5 mg via RESPIRATORY_TRACT

## 2015-04-23 MED ORDER — IPRATROPIUM-ALBUTEROL 0.5-2.5 (3) MG/3ML IN SOLN
3.0000 mL | Freq: Once | RESPIRATORY_TRACT | Status: AC
  Administered 2015-04-23: 3 mL via RESPIRATORY_TRACT

## 2015-04-23 MED ORDER — SODIUM CHLORIDE 0.9 % IV BOLUS (SEPSIS)
1000.0000 mL | Freq: Once | INTRAVENOUS | Status: AC
  Administered 2015-04-23: 1000 mL via INTRAVENOUS

## 2015-04-23 MED ORDER — METHYLPREDNISOLONE SODIUM SUCC 125 MG IJ SOLR
125.0000 mg | Freq: Once | INTRAMUSCULAR | Status: AC
  Administered 2015-04-23: 125 mg via INTRAVENOUS
  Filled 2015-04-23: qty 2

## 2015-04-23 MED ORDER — ALBUTEROL SULFATE (2.5 MG/3ML) 0.083% IN NEBU
INHALATION_SOLUTION | RESPIRATORY_TRACT | Status: AC
  Filled 2015-04-23: qty 9

## 2015-04-23 MED ORDER — LORAZEPAM 2 MG/ML IJ SOLN
1.0000 mg | Freq: Once | INTRAMUSCULAR | Status: AC
  Administered 2015-04-23: 1 mg via INTRAVENOUS
  Filled 2015-04-23: qty 1

## 2015-04-23 NOTE — ED Notes (Signed)
Pt less anxious, states breathing improved. Pt states "just so you know, i'm not stayin."

## 2015-04-23 NOTE — ED Notes (Signed)
Pt arrives with ems in resp distress, bipap in place. Pt states has had chest pain, took ntg x1 at home approx 1 hour pta.

## 2015-04-23 NOTE — Discharge Instructions (Signed)
Panic Attacks °Panic attacks are sudden, short-lived surges of severe anxiety, fear, or discomfort. They may occur for no reason when you are relaxed, when you are anxious, or when you are sleeping. Panic attacks may occur for a number of reasons:  °· Healthy people occasionally have panic attacks in extreme, life-threatening situations, such as war or natural disasters. Normal anxiety is a protective mechanism of the body that helps us react to danger (fight or flight response). °· Panic attacks are often seen with anxiety disorders, such as panic disorder, social anxiety disorder, generalized anxiety disorder, and phobias. Anxiety disorders cause excessive or uncontrollable anxiety. They may interfere with your relationships or other life activities. °· Panic attacks are sometimes seen with other mental illnesses, such as depression and posttraumatic stress disorder. °· Certain medical conditions, prescription medicines, and drugs of abuse can cause panic attacks. °SYMPTOMS  °Panic attacks start suddenly, peak within 20 minutes, and are accompanied by four or more of the following symptoms: °· Pounding heart or fast heart rate (palpitations). °· Sweating. °· Trembling or shaking. °· Shortness of breath or feeling smothered. °· Feeling choked. °· Chest pain or discomfort. °· Nausea or strange feeling in your stomach. °· Dizziness, light-headedness, or feeling like you will faint. °· Chills or hot flushes. °· Numbness or tingling in your lips or hands and feet. °· Feeling that things are not real or feeling that you are not yourself. °· Fear of losing control or going crazy. °· Fear of dying. °Some of these symptoms can mimic serious medical conditions. For example, you may think you are having a heart attack. Although panic attacks can be very scary, they are not life threatening. °DIAGNOSIS  °Panic attacks are diagnosed through an assessment by your health care provider. Your health care provider will ask  questions about your symptoms, such as where and when they occurred. Your health care provider will also ask about your medical history and use of alcohol and drugs, including prescription medicines. Your health care provider may order blood tests or other studies to rule out a serious medical condition. Your health care provider may refer you to a mental health professional for further evaluation. °TREATMENT  °· Most healthy people who have one or two panic attacks in an extreme, life-threatening situation will not require treatment. °· The treatment for panic attacks associated with anxiety disorders or other mental illness typically involves counseling with a mental health professional, medicine, or a combination of both. Your health care provider will help determine what treatment is best for you. °· Panic attacks due to physical illness usually go away with treatment of the illness. If prescription medicine is causing panic attacks, talk with your health care provider about stopping the medicine, decreasing the dose, or substituting another medicine. °· Panic attacks due to alcohol or drug abuse go away with abstinence. Some adults need professional help in order to stop drinking or using drugs. °HOME CARE INSTRUCTIONS  °· Take all medicines as directed by your health care provider.   °· Schedule and attend follow-up visits as directed by your health care provider. It is important to keep all your appointments. °SEEK MEDICAL CARE IF: °· You are not able to take your medicines as prescribed. °· Your symptoms do not improve or get worse. °SEEK IMMEDIATE MEDICAL CARE IF:  °· You experience panic attack symptoms that are different than your usual symptoms. °· You have serious thoughts about hurting yourself or others. °· You are taking medicine for panic attacks and   have a serious side effect. MAKE SURE YOU:  Understand these instructions.  Will watch your condition.  Will get help right away if you are not  doing well or get worse.   This information is not intended to replace advice given to you by your health care provider. Make sure you discuss any questions you have with your health care provider.   Document Released: 03/17/2005 Document Revised: 03/22/2013 Document Reviewed: 10/29/2012 Elsevier Interactive Patient Education 2016 [employer] Chronic Obstructive Pulmonary Disease Chronic obstructive pulmonary disease (COPD) is a common lung condition in which airflow from the lungs is limited. COPD is a general term that can be used to describe many different lung problems that limit airflow, including both chronic bronchitis and emphysema. If you have COPD, your lung function will probably never return to normal, but there are measures you can take to improve lung function and make yourself feel better. CAUSES   Smoking (common).  Exposure to secondhand smoke.  Genetic problems.  Chronic inflammatory lung diseases or recurrent infections. SYMPTOMS  Shortness of breath, especially with physical activity.  Deep, persistent (chronic) cough with a large amount of thick mucus.  Wheezing.  Rapid breaths (tachypnea).  Gray or bluish discoloration (cyanosis) of the skin, especially in your fingers, toes, or lips.  Fatigue.  Weight loss.  Frequent infections or episodes when breathing symptoms become much worse (exacerbations).  Chest tightness. DIAGNOSIS Your health care provider will take a medical history and perform a physical examination to diagnose COPD. Additional tests for COPD may include:  Lung (pulmonary) function tests.  Chest X-ray.  CT scan.  Blood tests. TREATMENT  Treatment for COPD may include:  Inhaler and nebulizer medicines. These help manage the symptoms of COPD and make your breathing more comfortable.  Supplemental oxygen. Supplemental oxygen is only helpful if you have a low oxygen level in your blood.  Exercise and physical activity. These are  beneficial for nearly all people with COPD.  Lung surgery or transplant.  Nutrition therapy to gain weight, if you are underweight.  Pulmonary rehabilitation. This may involve working with a team of health care providers and specialists, such as respiratory, occupational, and physical therapists. HOME CARE INSTRUCTIONS  Take all medicines (inhaled or pills) as directed by your health care provider.  Avoid over-the-counter medicines or cough syrups that dry up your airway (such as antihistamines) and slow down the elimination of secretions unless instructed otherwise by your health care provider.  If you are a smoker, the most important thing that you can do is stop smoking. Continuing to smoke will cause further lung damage and breathing trouble. Ask your health care provider for help with quitting smoking. He or she can direct you to community resources or hospitals that provide support.  Avoid exposure to irritants such as smoke, chemicals, and fumes that aggravate your breathing.  Use oxygen therapy and pulmonary rehabilitation if directed by your health care provider. If you require home oxygen therapy, ask your health care provider whether you should purchase a pulse oximeter to measure your oxygen level at home.  Avoid contact with individuals who have a contagious illness.  Avoid extreme temperature and humidity changes.  Eat healthy foods. Eating smaller, more frequent meals and resting before meals may help you maintain your strength.  Stay active, but balance activity with periods of rest. Exercise and physical activity will help you maintain your ability to do things you want to do.  Preventing infection and hospitalization is very important when  you have COPD. Make sure to receive all the vaccines your health care provider recommends, especially the pneumococcal and influenza vaccines. Ask your health care provider whether you need a pneumonia vaccine.  Learn and use  relaxation techniques to manage stress.  Learn and use controlled breathing techniques as directed by your health care provider. Controlled breathing techniques include:  Pursed lip breathing. Start by breathing in (inhaling) through your nose for 1 second. Then, purse your lips as if you were going to whistle and breathe out (exhale) through the pursed lips for 2 seconds.  Diaphragmatic breathing. Start by putting one hand on your abdomen just above your waist. Inhale slowly through your nose. The hand on your abdomen should move out. Then purse your lips and exhale slowly. You should be able to feel the hand on your abdomen moving in as you exhale.  Learn and use controlled coughing to clear mucus from your lungs. Controlled coughing is a series of short, progressive coughs. The steps of controlled coughing are:  Lean your head slightly forward.  Breathe in deeply using diaphragmatic breathing.  Try to hold your breath for 3 seconds.  Keep your mouth slightly open while coughing twice.  Spit any mucus out into a tissue.  Rest and repeat the steps once or twice as needed. SEEK MEDICAL CARE IF:  You are coughing up more mucus than usual.  There is a change in the color or thickness of your mucus.  Your breathing is more labored than usual.  Your breathing is faster than usual. SEEK IMMEDIATE MEDICAL CARE IF:  You have shortness of breath while you are resting.  You have shortness of breath that prevents you from:  Being able to talk.  Performing your usual physical activities.  You have chest pain lasting longer than 5 minutes.  Your skin color is more cyanotic than usual.  You measure low oxygen saturations for longer than 5 minutes with a pulse oximeter. MAKE SURE YOU:  Understand these instructions.  Will watch your condition.  Will get help right away if you are not doing well or get worse.   This information is not intended to replace advice given to you by  your health care provider. Make sure you discuss any questions you have with your health care provider.   Document Released: 12/25/2004 Document Revised: 04/07/2014 Document Reviewed: 11/11/2012 Elsevier Interactive Patient Education [employer]

## 2015-04-23 NOTE — ED Provider Notes (Signed)
Patient is resting comfortably, I removed her BiPAP. Patient is tolerating this well, saturations are good. She does not want to be admitted to the hospital. She has extra wheezing only at this time.  Emily Filbert, MD 04/23/15 [phone]

## 2015-04-23 NOTE — ED Notes (Signed)
Pt sleeping. 

## 2015-04-23 NOTE — ED Notes (Signed)
Report to bill, rn.  

## 2015-04-23 NOTE — ED Notes (Signed)
Pt sleeping, on bipap, no obvious resp distress. Vss. See flowsheet.

## 2015-04-23 NOTE — ED Provider Notes (Signed)
[facility] Emergency Department Provider Note  ____________________________________________  Time seen: Approximately 454 AM  I have reviewed the triage vital signs and the nursing notes.   HISTORY  Chief Complaint Respiratory Distress  Patient in respiratory distress and unable to give a full history  HPI Julie Foley is a 60 y.o. female who comes into the hospital with SOB. The patient was here yesterday with similar symptoms, but refused admission. The patient reports that she woke up out of sleep in a panic and feeling acutely SOB. The patient took 4 breathing treatments at home but reports that she did not get any relief so she called EMS. The patient was placed on CPAP by EMS and brought in. She informed EMS that the CPAP helped. The patient has some chest discomfort and some cough but no fevers or abdominal pain.     Past Medical History  Diagnosis Date  . COPD (chronic obstructive pulmonary disease) (HCC)   . CHF (congestive heart failure) (HCC)   . HTN (hypertension)   . GERD (gastroesophageal reflux disease)   . Depression   . Anxiety     Patient Active Problem List   Diagnosis Date Noted  . Sepsis (HCC) 02/16/2015  . COPD with acute exacerbation (HCC) 01/25/2015  . Acute respiratory failure with hypoxemia (HCC) 10/07/2014  . Anxiety 10/07/2014  . HTN (hypertension) 09/23/2014  . GERD (gastroesophageal reflux disease) 09/23/2014  . CHF (congestive heart failure) (HCC) 09/23/2014  . COPD exacerbation (HCC) 08/11/2014    Past Surgical History  Procedure Laterality Date  . Tubal ligation Bilateral     Current Outpatient Rx  Name  Route  Sig  Dispense  Refill  . albuterol (PROVENTIL HFA;VENTOLIN HFA) 108 (90 BASE) MCG/ACT inhaler   Inhalation   Inhale 2 puffs into the lungs every 4 (four) hours as needed for wheezing or shortness of breath.         Marland Kitchen albuterol (PROVENTIL) (2.5 MG/3ML) 0.083% nebulizer solution   Nebulization  Take 3 mLs (2.5 mg total) by nebulization every 4 (four) hours as needed for wheezing or shortness of breath.   75 mL   2   . amLODipine (NORVASC) 5 MG tablet   Oral   Take 5 mg by mouth daily.         . beclomethasone (QVAR) 80 MCG/ACT inhaler   Inhalation   Inhale 1 puff into the lungs 2 (two) times daily.         . carvedilol (COREG) 12.5 MG tablet   Oral   Take 1 tablet (12.5 mg total) by mouth 2 (two) times daily with a meal.   30 tablet   0   . enalapril (VASOTEC) 20 MG tablet   Oral   Take 20 mg by mouth 2 (two) times daily.          . famotidine (PEPCID) 20 MG tablet   Oral   Take 20 mg by mouth 2 (two) times daily.         . fluticasone (FLONASE) 50 MCG/ACT nasal spray   Each Nare   Place 1 spray into both nostrils 2 (two) times daily.          . Fluticasone-Salmeterol (ADVAIR) 500-50 MCG/DOSE AEPB   Inhalation   Inhale 1 puff into the lungs 2 (two) times daily.         . hydrochlorothiazide (HYDRODIURIL) 25 MG tablet   Oral   Take 25 mg by mouth daily.         Marland Kitchen  ipratropium-albuterol (DUONEB) 0.5-2.5 (3) MG/3ML SOLN   Nebulization   Take 3 mLs by nebulization every 6 (six) hours as needed.   36 mL   0   . levofloxacin (LEVAQUIN) 500 MG tablet   Oral   Take 1 tablet (500 mg total) by mouth daily. Patient not taking: Reported on 04/22/2015   7 tablet   0   . loratadine (CLARITIN) 10 MG tablet   Oral   Take 10 mg by mouth daily.         Marland Kitchen LORazepam (ATIVAN) 0.5 MG tablet   Oral   Take 2 tablets (1 mg total) by mouth every 8 (eight) hours as needed for anxiety.   15 tablet   0   . montelukast (SINGULAIR) 10 MG tablet   Oral   Take 10 mg by mouth every evening.         . nitroGLYCERIN (NITROSTAT) 0.4 MG SL tablet   Sublingual   Place 0.4 mg under the tongue every 5 (five) minutes as needed for chest pain.         . predniSONE (DELTASONE) 20 MG tablet      Take 3 tablets daily x 4 days, then Take 2 tablets daily x 4 days,  then Take 1 tablet daily x 4 days   24 tablet   0   . roflumilast (DALIRESP) 500 MCG TABS tablet   Oral   Take 500 mcg by mouth daily.         Marland Kitchen tiotropium (SPIRIVA) 18 MCG inhalation capsule   Inhalation   Place 18 mcg into inhaler and inhale daily.           Allergies Budesonide-formoterol fumarate  Family History  Problem Relation Age of Onset  . Cancer Mother   . Hypertension Mother   . Diabetes Mother   . Asthma Son     Social History Social History  Substance Use Topics  . Smoking status: Former Smoker -- 2.00 packs/day for 35 years    Types: Cigarettes  . Smokeless tobacco: None  . Alcohol Use: Yes    Review of Systems Constitutional: No fever/chills Eyes: No visual changes. ENT: No sore throat. Cardiovascular: chest pain. Respiratory:  shortness of breath. Gastrointestinal: No abdominal pain.  No nausea, no vomiting.  No diarrhea.  No constipation. Genitourinary: Negative for dysuria. Musculoskeletal: Negative for back pain. Skin: Negative for rash. Neurological: Negative for headaches, focal weakness or numbness.  10-point ROS otherwise negative.  ____________________________________________   PHYSICAL EXAM:  VITAL SIGNS: ED Triage Vitals  Enc Vitals Group     BP 04/23/15 0500 130/85 mmHg     Pulse Rate 04/23/15 0458 140     Resp 04/23/15 0458 28     Temp 04/23/15 0458 97.8 F (36.6 C)     Temp Source 04/23/15 0458 Axillary     SpO2 04/23/15 0458 100 %     Weight 04/23/15 0458 162 lb (73.483 kg)     Height 04/23/15 0458  (1.575 m)     Head Cir --      Peak Flow --      Pain Score 04/23/15 0501 8     Pain Loc --      Pain Edu? --      Excl. in GC? --     Constitutional: Alert and oriented. Ill appearing and in severe distress. Eyes: Conjunctivae are normal. PERRL. EOMI. Head: Atraumatic. Nose: No congestion/rhinnorhea. Mouth/Throat: Mucous membranes are moist.  Oropharynx non-erythematous. Cardiovascular:  Tachycardia  regular rhythm. Grossly normal heart sounds.  Good peripheral circulation. Respiratory: Increased respiratory effort. with retractions. Wheezes throughout Gastrointestinal: Soft and nontender. No distention. Positive bowel sounds Musculoskeletal: No lower extremity tenderness nor edema.  Neurologic:  Normal speech and language.  Skin:  Skin is warm, dry and intact.  Psychiatric: Mood and affect are normal.   ____________________________________________   LABS (all labs ordered are listed, but only abnormal results are displayed)  Labs Reviewed  COMPREHENSIVE METABOLIC PANEL - Abnormal; Notable for the following:    CO2 34 (*)    Glucose, Bld 120 (*)    All other components within normal limits  BLOOD GAS, VENOUS - Abnormal; Notable for the following:    Bicarbonate 32.5 (*)    Acid-Base Excess 5.8 (*)    All other components within normal limits  CBC  TROPONIN I   ____________________________________________  EKG  ED ECG REPORT I, Rebecka Apley, the attending physician, personally viewed and interpreted this ECG.   Date: 04/23/2015  EKG Time: 506  Rate: 140  Rhythm: sinus tachycardia  Axis: normal  Intervals:prolonged qtc  ST&T Change: none  ____________________________________________  RADIOLOGY  CXR: No acute chest findings ____________________________________________   PROCEDURES  Procedure(s) performed: None  Critical Care performed: Yes, see critical care note(s)  CRITICAL CARE Performed by: Lucrezia Europe P   Total critical care time: 30 minutes  Critical care time was exclusive of separately billable procedures and treating other patients.  Critical care was necessary to treat or prevent imminent or life-threatening deterioration.  Critical care was time spent personally by me on the following activities: development of treatment plan with patient and/or surrogate as well as nursing, discussions with consultants, evaluation of patient's  response to treatment, examination of patient, obtaining history from patient or surrogate, ordering and performing treatments and interventions, ordering and review of laboratory studies, ordering and review of radiographic studies, pulse oximetry and re-evaluation of patient's condition.  ____________________________________________   INITIAL IMPRESSION / ASSESSMENT AND PLAN / ED COURSE  Pertinent labs & imaging results that were available during my care of the patient were reviewed by me and considered in my medical decision making (see chart for details).  This is a 60 year old female who was here yesterday as well as multiple frequent visits with shortness of breath. The patient has COPD but typically signed out AGAINST MEDICAL ADVICE. The patient was placed on BiPAP when she arrived and was doing immediately better. The patient's blood work is unremarkable. I will await the results of a repeat troponin as well as a chest x-ray and have the patient reassessed by Dr. Mayford Knife. ____________________________________________   FINAL CLINICAL IMPRESSION(S) / ED DIAGNOSES  Final diagnoses:  Chronic obstructive pulmonary disease, unspecified COPD type (HCC)  Dyspnea      Rebecka Apley, MD 04/23/15 [phone]

## 2015-05-20 ENCOUNTER — Emergency Department
Admission: EM | Admit: 2015-05-20 | Discharge: 2015-05-20 | Discharge status: Against Medical Advice | Payer: Medicaid Other | Attending: Emergency Medicine | Admitting: Emergency Medicine

## 2015-05-20 ENCOUNTER — Emergency Department: Payer: Medicaid Other

## 2015-05-20 DIAGNOSIS — J441 Chronic obstructive pulmonary disease with (acute) exacerbation: Secondary | ICD-10-CM | POA: Diagnosis not present

## 2015-05-20 DIAGNOSIS — Z79899 Other long term (current) drug therapy: Secondary | ICD-10-CM | POA: Insufficient documentation

## 2015-05-20 DIAGNOSIS — R0602 Shortness of breath: Secondary | ICD-10-CM | POA: Diagnosis present

## 2015-05-20 DIAGNOSIS — Z7951 Long term (current) use of inhaled steroids: Secondary | ICD-10-CM | POA: Insufficient documentation

## 2015-05-20 DIAGNOSIS — R000 Tachycardia, unspecified: Secondary | ICD-10-CM | POA: Insufficient documentation

## 2015-05-20 DIAGNOSIS — I10 Essential (primary) hypertension: Secondary | ICD-10-CM | POA: Insufficient documentation

## 2015-05-20 DIAGNOSIS — Z87891 Personal history of nicotine dependence: Secondary | ICD-10-CM | POA: Diagnosis not present

## 2015-05-20 LAB — COMPREHENSIVE METABOLIC PANEL
ALT: 18 U/L (ref 14–54)
ANION GAP: 12 (ref 5–15)
AST: 21 U/L (ref 15–41)
Albumin: 4.4 g/dL (ref 3.5–5.0)
Alkaline Phosphatase: 68 U/L (ref 38–126)
BILIRUBIN TOTAL: 0.4 mg/dL (ref 0.3–1.2)
BUN: 12 mg/dL (ref 6–20)
CHLORIDE: 107 mmol/L (ref 101–111)
CO2: 24 mmol/L (ref 22–32)
Calcium: 9.1 mg/dL (ref 8.9–10.3)
Creatinine, Ser: 0.79 mg/dL (ref 0.44–1.00)
Glucose, Bld: 113 mg/dL — ABNORMAL HIGH (ref 65–99)
POTASSIUM: 4.5 mmol/L (ref 3.5–5.1)
Sodium: 143 mmol/L (ref 135–145)
TOTAL PROTEIN: 7.4 g/dL (ref 6.5–8.1)

## 2015-05-20 LAB — CBC
HCT: 36.6 % (ref 35.0–47.0)
HEMOGLOBIN: 11.9 g/dL — AB (ref 12.0–16.0)
MCH: 32.1 pg (ref 26.0–34.0)
MCHC: 32.4 g/dL (ref 32.0–36.0)
MCV: 98.9 fL (ref 80.0–100.0)
PLATELETS: 351 10*3/uL (ref 150–440)
RBC: 3.7 MIL/uL — AB (ref 3.80–5.20)
RDW: 13.4 % (ref 11.5–14.5)
WBC: 14.3 10*3/uL — AB (ref 3.6–11.0)

## 2015-05-20 LAB — TROPONIN I

## 2015-05-20 MED ORDER — DEXTROSE 5 % IV SOLN
500.0000 mg | Freq: Once | INTRAVENOUS | Status: AC
  Administered 2015-05-20: 500 mg via INTRAVENOUS
  Filled 2015-05-20: qty 500

## 2015-05-20 MED ORDER — LORAZEPAM 2 MG/ML IJ SOLN
1.0000 mg | Freq: Once | INTRAMUSCULAR | Status: AC
  Administered 2015-05-20: 1 mg via INTRAVENOUS

## 2015-05-20 MED ORDER — AZITHROMYCIN 250 MG PO TABS: ORAL_TABLET | ORAL | Status: AC

## 2015-05-20 MED ORDER — IPRATROPIUM-ALBUTEROL 0.5-2.5 (3) MG/3ML IN SOLN
3.0000 mL | Freq: Once | RESPIRATORY_TRACT | Status: AC
  Administered 2015-05-20: 3 mL via RESPIRATORY_TRACT

## 2015-05-20 MED ORDER — IPRATROPIUM-ALBUTEROL 0.5-2.5 (3) MG/3ML IN SOLN
RESPIRATORY_TRACT | Status: AC
  Administered 2015-05-20: 3 mL via RESPIRATORY_TRACT
  Filled 2015-05-20: qty 9

## 2015-05-20 MED ORDER — MAGNESIUM SULFATE 2 GM/50ML IV SOLN
2.0000 g | Freq: Once | INTRAVENOUS | Status: AC
  Administered 2015-05-20: 2 g via INTRAVENOUS
  Filled 2015-05-20: qty 50

## 2015-05-20 MED ORDER — ALBUTEROL SULFATE (2.5 MG/3ML) 0.083% IN NEBU
2.5000 mg | INHALATION_SOLUTION | Freq: Once | RESPIRATORY_TRACT | Status: AC
  Administered 2015-05-20: 2.5 mg via RESPIRATORY_TRACT
  Filled 2015-05-20: qty 3

## 2015-05-20 MED ORDER — SODIUM CHLORIDE 0.9 % IV BOLUS (SEPSIS): 500.0000 mL | Freq: Once | INTRAVENOUS | Status: DC

## 2015-05-20 MED ORDER — LORAZEPAM 2 MG/ML IJ SOLN
INTRAMUSCULAR | Status: AC
  Administered 2015-05-20: 1 mg via INTRAVENOUS
  Filled 2015-05-20: qty 1

## 2015-05-20 MED ORDER — SODIUM CHLORIDE 0.9 % IV BOLUS (SEPSIS)
1000.0000 mL | Freq: Once | INTRAVENOUS | Status: AC
  Administered 2015-05-20: 1000 mL via INTRAVENOUS

## 2015-05-20 MED ORDER — PREDNISONE 20 MG PO TABS: 60.0000 mg | ORAL_TABLET | Freq: Every day | ORAL | Status: DC

## 2015-05-20 NOTE — ED Notes (Signed)
Pt requesting to be discharged. Dr. Zenda Alpers aware. Pt tolerating 4 liters via [place] w/o difficulty.

## 2015-05-20 NOTE — ED Notes (Signed)
Pt request meal tray. Pt sitting up on stretcher eating at this time. Pt switched from BIPAP to 4 liters via [place]. Pt reports feeling much better at this time.

## 2015-05-20 NOTE — ED Notes (Signed)
Patient here for respiratory distress. 

## 2015-05-20 NOTE — Discharge Instructions (Signed)

## 2015-05-20 NOTE — ED Provider Notes (Signed)
[facility] Emergency Department Provider Note  ____________________________________________  Time seen: Approximately 0058 AM  I have reviewed the triage vital signs and the nursing notes.   HISTORY  Chief Complaint Shortness of Breath  History Limited by patient's respiratory distress  HPI Julie Foley is a 60 y.o. female who comes into the hospital today with some difficulty breathing.According to EMS the patient had 4 breathing treatments before she called EMS. She has been feeling short of breath all day and according to EMS she is tachycardic. They did give her 125 mg of Solu-Medrol and attempted to breathing treatments but the patient would not sit back to tolerate her breathing treatments. The patient is yelling saying that she cannot breathe and very distressed. The patient has been seen in the hospital for similar symptoms multiple times in the past. The patient did complain of some chest tightness.   Past Medical History  Diagnosis Date  . COPD (chronic obstructive pulmonary disease) (HCC)   . CHF (congestive heart failure) (HCC)   . HTN (hypertension)   . GERD (gastroesophageal reflux disease)   . Depression   . Anxiety     Patient Active Problem List   Diagnosis Date Noted  . Sepsis (HCC) 02/16/2015  . COPD with acute exacerbation (HCC) 01/25/2015  . Acute respiratory failure with hypoxemia (HCC) 10/07/2014  . Anxiety 10/07/2014  . HTN (hypertension) 09/23/2014  . GERD (gastroesophageal reflux disease) 09/23/2014  . CHF (congestive heart failure) (HCC) 09/23/2014  . COPD exacerbation (HCC) 08/11/2014    Past Surgical History  Procedure Laterality Date  . Tubal ligation Bilateral     Current Outpatient Rx  Name  Route  Sig  Dispense  Refill  . albuterol (PROVENTIL HFA;VENTOLIN HFA) 108 (90 BASE) MCG/ACT inhaler   Inhalation   Inhale 2 puffs into the lungs every 4 (four) hours as needed for wheezing or shortness of breath.          Marland Kitchen albuterol (PROVENTIL) (2.5 MG/3ML) 0.083% nebulizer solution   Nebulization   Take 3 mLs (2.5 mg total) by nebulization every 4 (four) hours as needed for wheezing or shortness of breath.   75 mL   2   . amLODipine (NORVASC) 5 MG tablet   Oral   Take 5 mg by mouth daily.         . beclomethasone (QVAR) 80 MCG/ACT inhaler   Inhalation   Inhale 1 puff into the lungs 2 (two) times daily.         . carvedilol (COREG) 12.5 MG tablet   Oral   Take 1 tablet (12.5 mg total) by mouth 2 (two) times daily with a meal.   30 tablet   0   . enalapril (VASOTEC) 20 MG tablet   Oral   Take 20 mg by mouth 2 (two) times daily.          . fluticasone (FLONASE) 50 MCG/ACT nasal spray   Each Nare   Place 1 spray into both nostrils 2 (two) times daily.          . Fluticasone-Salmeterol (ADVAIR) 500-50 MCG/DOSE AEPB   Inhalation   Inhale 1 puff into the lungs 2 (two) times daily.         . hydrochlorothiazide (HYDRODIURIL) 25 MG tablet   Oral   Take 25 mg by mouth daily.         Marland Kitchen ipratropium-albuterol (DUONEB) 0.5-2.5 (3) MG/3ML SOLN   Nebulization   Take 3 mLs by nebulization  every 6 (six) hours as needed.   36 mL   0   . loratadine (CLARITIN) 10 MG tablet   Oral   Take 10 mg by mouth daily.         Marland Kitchen LORazepam (ATIVAN) 0.5 MG tablet   Oral   Take 2 tablets (1 mg total) by mouth every 8 (eight) hours as needed for anxiety.   15 tablet   0   . montelukast (SINGULAIR) 10 MG tablet   Oral   Take 10 mg by mouth every evening.         . nitroGLYCERIN (NITROSTAT) 0.4 MG SL tablet   Sublingual   Place 0.4 mg under the tongue every 5 (five) minutes as needed for chest pain.         . roflumilast (DALIRESP) 500 MCG TABS tablet   Oral   Take 500 mcg by mouth daily.         Marland Kitchen tiotropium (SPIRIVA) 18 MCG inhalation capsule   Inhalation   Place 18 mcg into inhaler and inhale daily.         Marland Kitchen azithromycin (ZITHROMAX Z-PAK) 250 MG tablet      Take 2  tablets (500 mg) on  Day 1,  followed by 1 tablet (250 mg) once daily on Days 2 through 5.   6 each   0   . levofloxacin (LEVAQUIN) 500 MG tablet   Oral   Take 1 tablet (500 mg total) by mouth daily. Patient not taking: Reported on 04/22/2015   7 tablet   0   . predniSONE (DELTASONE) 20 MG tablet      Take 3 tablets daily x 4 days, then Take 2 tablets daily x 4 days, then Take 1 tablet daily x 4 days Patient not taking: Reported on 04/23/2015   24 tablet   0   . predniSONE (DELTASONE) 20 MG tablet   Oral   Take 3 tablets (60 mg total) by mouth daily.   12 tablet   0     Allergies Symbicort and Formoterol fumarate  Family History  Problem Relation Age of Onset  . Cancer Mother   . Hypertension Mother   . Diabetes Mother   . Asthma Son     Social History Social History  Substance Use Topics  . Smoking status: Former Smoker -- 2.00 packs/day for 35 years    Types: Cigarettes  . Smokeless tobacco: None  . Alcohol Use: Yes    Review of Systems Constitutional: No fever/chills Eyes: No visual changes. ENT: No sore throat. Cardiovascular: Chest tightness Respiratory: shortness of breath. Gastrointestinal: No abdominal pain.  No nausea, no vomiting.  No diarrhea.  No constipation. Genitourinary: Negative for dysuria. Musculoskeletal: Negative for back pain. Skin: Negative for rash. Neurological: Negative for headaches, focal weakness or numbness.  10-point ROS otherwise negative.  ____________________________________________   PHYSICAL EXAM:  VITAL SIGNS: ED Triage Vitals  Enc Vitals Group     BP 05/20/15 0103 163/98 mmHg     Pulse Rate 05/20/15 0103 140     Resp 05/20/15 0103 23     Temp 05/20/15 0103 97.6 F (36.4 C)     Temp Source 05/20/15 0103 Axillary     SpO2 05/20/15 0103 100 %     Weight 05/20/15 0103 170 lb (77.111 kg)     Height 05/20/15 0103  (1.575 m)     Head Cir --      Peak Flow --  Pain Score 05/20/15 0107 7     Pain Loc  --      Pain Edu? --      Excl. in GC? --     Constitutional: Alert and oriented. Well appearing and in severe distress. Eyes: Conjunctivae are normal. PERRL. EOMI. Head: Atraumatic. Nose: No congestion/rhinnorhea. Mouth/Throat: Mucous membranes are moist.  Oropharynx non-erythematous. Cardiovascular: Tachycardia regular rhythm. Grossly normal heart sounds.  Good peripheral circulation. Respiratory: Increased respiratory effort.  No retractions. Expiratory wheezes throughout all lung fields Gastrointestinal: Soft and nontender. No distention. Positive bowel sounds Musculoskeletal: No lower extremity tenderness nor edema.   Neurologic:  Normal speech and language.  Skin:  Skin is warm, dry and intact.  Psychiatric: Mood and affect are normal. .  ____________________________________________   LABS (all labs ordered are listed, but only abnormal results are displayed)  Labs Reviewed  COMPREHENSIVE METABOLIC PANEL - Abnormal; Notable for the following:    Glucose, Bld 113 (*)    All other components within normal limits  BLOOD GAS, VENOUS - Abnormal; Notable for the following:    pH, Ven 7.22 (*)    pCO2, Ven 78 (*)    Bicarbonate 31.9 (*)    All other components within normal limits  CBC - Abnormal; Notable for the following:    WBC 14.3 (*)    RBC 3.70 (*)    Hemoglobin 11.9 (*)    All other components within normal limits  BLOOD GAS, VENOUS - Abnormal; Notable for the following:    pCO2, Ven 43 (*)    All other components within normal limits  TROPONIN I   ____________________________________________  EKG  ED ECG REPORT I, Rebecka Apley, the attending physician, personally viewed and interpreted this ECG.   Date: 05/20/2015  EKG Time: 114  Rate: 145  Rhythm: sinus tachycardia  Axis: normal  Intervals:none  ST&T Change: non  ____________________________________________  RADIOLOGY  Chest x-ray: No active  disease ____________________________________________   PROCEDURES  Procedure(s) performed: None  Critical Care performed: Yes, see critical care note(s)  CRITICAL CARE Performed by: Lucrezia Europe P   Total critical care time: 30 minutes  Critical care time was exclusive of separately billable procedures and treating other patients.  Critical care was necessary to treat or prevent imminent or life-threatening deterioration.  Critical care was time spent personally by me on the following activities: development of treatment plan with patient and/or surrogate as well as nursing, discussions with consultants, evaluation of patient's response to treatment, examination of patient, obtaining history from patient or surrogate, ordering and performing treatments and interventions, ordering and review of laboratory studies, ordering and review of radiographic studies, pulse oximetry and re-evaluation of patient's condition.  ____________________________________________   INITIAL IMPRESSION / ASSESSMENT AND PLAN / ED COURSE  Pertinent labs & imaging results that were available during my care of the patient were reviewed by me and considered in my medical decision making (see chart for details).  This is a 60 year old female who is well-known to the emergency department who comes into the hospital today with some shortness of breath. The patient has some diffuse wheezing. We placed her on BiPAP when she arrived to the hospital and gave her 3 DuoNeb treatments through her BiPAP. The patient has ready received Solu-Medrol but I did give her 1 mg of Ativan. The patient's blood gas has returned with a pH of 722 and a PCO2 of 78. I will give the patient some magnesium sulfate and reassess the patient  once I receive some more of her blood work.  The patient had some improvement in her blood gas after being on the bipap. The patient had some continued tachycardia but reports that she does not want to  stay in the hospital. She reports that she is emotionally upset due to her EMS experience. She will sign out against medical advice. She understands the risk of leaving against medical advice.  ____________________________________________   FINAL CLINICAL IMPRESSION(S) / ED DIAGNOSES  Final diagnoses:  COPD exacerbation (HCC)      Rebecka Apley, MD 05/20/15 [phone]

## 2015-05-23 LAB — BLOOD GAS, VENOUS
Acid-Base Excess: 1.8 mmol/L (ref 0.0–3.0)
Acid-Base Excess: 0.8 mmol/L (ref 0.0–3.0)
BICARBONATE: 31.9 meq/L — AB (ref 21.0–28.0)
BICARBONATE: 26 meq/L (ref 21.0–28.0)
DELIVERY SYSTEMS: POSITIVE
DELIVERY SYSTEMS: POSITIVE
PH VEN: 7.39 (ref 7.320–7.430)
Patient temperature: 37
Patient temperature: 37
pCO2, Ven: 78 mmHg (ref 44.0–60.0)
pCO2, Ven: 43 mmHg — ABNORMAL LOW (ref 44.0–60.0)
pH, Ven: 7.22 — ABNORMAL LOW (ref 7.320–7.430)

## 2015-05-26 ENCOUNTER — Emergency Department: Payer: Medicaid Other

## 2015-05-26 ENCOUNTER — Encounter: Payer: Self-pay | Admitting: Emergency Medicine

## 2015-05-26 ENCOUNTER — Inpatient Hospital Stay
Admission: EM | Admit: 2015-05-26 | Discharge: 2015-05-27 | DRG: 189 | Discharge status: Against Medical Advice | Payer: Medicaid Other | Attending: Internal Medicine | Admitting: Internal Medicine

## 2015-05-26 DIAGNOSIS — Z8249 Family history of ischemic heart disease and other diseases of the circulatory system: Secondary | ICD-10-CM

## 2015-05-26 DIAGNOSIS — Z9981 Dependence on supplemental oxygen: Secondary | ICD-10-CM

## 2015-05-26 DIAGNOSIS — Z825 Family history of asthma and other chronic lower respiratory diseases: Secondary | ICD-10-CM

## 2015-05-26 DIAGNOSIS — I110 Hypertensive heart disease with heart failure: Secondary | ICD-10-CM | POA: Diagnosis present

## 2015-05-26 DIAGNOSIS — J9621 Acute and chronic respiratory failure with hypoxia: Principal | ICD-10-CM | POA: Diagnosis present

## 2015-05-26 DIAGNOSIS — Z87891 Personal history of nicotine dependence: Secondary | ICD-10-CM

## 2015-05-26 DIAGNOSIS — R0789 Other chest pain: Secondary | ICD-10-CM | POA: Diagnosis present

## 2015-05-26 DIAGNOSIS — R000 Tachycardia, unspecified: Secondary | ICD-10-CM | POA: Diagnosis present

## 2015-05-26 DIAGNOSIS — Z79899 Other long term (current) drug therapy: Secondary | ICD-10-CM | POA: Diagnosis not present

## 2015-05-26 DIAGNOSIS — K219 Gastro-esophageal reflux disease without esophagitis: Secondary | ICD-10-CM | POA: Diagnosis present

## 2015-05-26 DIAGNOSIS — Z833 Family history of diabetes mellitus: Secondary | ICD-10-CM | POA: Diagnosis not present

## 2015-05-26 DIAGNOSIS — J9600 Acute respiratory failure, unspecified whether with hypoxia or hypercapnia: Secondary | ICD-10-CM | POA: Diagnosis present

## 2015-05-26 DIAGNOSIS — J441 Chronic obstructive pulmonary disease with (acute) exacerbation: Secondary | ICD-10-CM | POA: Diagnosis present

## 2015-05-26 DIAGNOSIS — Z9119 Patient's noncompliance with other medical treatment and regimen: Secondary | ICD-10-CM

## 2015-05-26 DIAGNOSIS — I509 Heart failure, unspecified: Secondary | ICD-10-CM | POA: Diagnosis present

## 2015-05-26 LAB — COMPREHENSIVE METABOLIC PANEL
ALBUMIN: 4.7 g/dL (ref 3.5–5.0)
ALK PHOS: 68 U/L (ref 38–126)
ALT: 22 U/L (ref 14–54)
ANION GAP: 6 (ref 5–15)
AST: 21 U/L (ref 15–41)
BUN: 14 mg/dL (ref 6–20)
CALCIUM: 8.7 mg/dL — AB (ref 8.9–10.3)
CHLORIDE: 103 mmol/L (ref 101–111)
CO2: 30 mmol/L (ref 22–32)
Creatinine, Ser: 0.83 mg/dL (ref 0.44–1.00)
GFR calc non Af Amer: >60 mL/min (ref >60)
GLUCOSE: 134 mg/dL — AB (ref 65–99)
POTASSIUM: 4.6 mmol/L (ref 3.5–5.1)
SODIUM: 139 mmol/L (ref 135–145)
Total Bilirubin: 0.3 mg/dL (ref 0.3–1.2)
Total Protein: 7.7 g/dL (ref 6.5–8.1)

## 2015-05-26 LAB — CBC
HCT: 41.4 % (ref 35.0–47.0)
HEMOGLOBIN: 13.6 g/dL (ref 12.0–16.0)
MCH: 32.1 pg (ref 26.0–34.0)
MCHC: 32.8 g/dL (ref 32.0–36.0)
MCV: 97.8 fL (ref 80.0–100.0)
PLATELETS: 385 10*3/uL (ref 150–440)
RBC: 4.23 MIL/uL (ref 3.80–5.20)
RDW: 13.2 % (ref 11.5–14.5)
WBC: 10.6 10*3/uL (ref 3.6–11.0)

## 2015-05-26 LAB — TROPONIN I: Troponin I: <0.03 ng/mL (ref <0.031)

## 2015-05-26 MED ORDER — AZITHROMYCIN 250 MG PO TABS: 250.0000 mg | ORAL_TABLET | Freq: Every day | ORAL | Status: DC

## 2015-05-26 MED ORDER — LORAZEPAM 2 MG/ML IJ SOLN
1.0000 mg | Freq: Once | INTRAMUSCULAR | Status: AC
  Administered 2015-05-26: 1 mg via INTRAVENOUS
  Filled 2015-05-26: qty 1

## 2015-05-26 MED ORDER — NITROGLYCERIN 0.4 MG SL SUBL: 0.4000 mg | SUBLINGUAL_TABLET | SUBLINGUAL | Status: DC | PRN

## 2015-05-26 MED ORDER — SODIUM CHLORIDE 0.9% FLUSH
3.0000 mL | Freq: Two times a day (BID) | INTRAVENOUS | Status: DC
  Administered 2015-05-27: 3 mL via INTRAVENOUS

## 2015-05-26 MED ORDER — CARVEDILOL 6.25 MG PO TABS
12.5000 mg | ORAL_TABLET | Freq: Two times a day (BID) | ORAL | Status: DC
  Administered 2015-05-27 (×2): 12.5 mg via ORAL
  Filled 2015-05-26 (×2): qty 2

## 2015-05-26 MED ORDER — IPRATROPIUM-ALBUTEROL 0.5-2.5 (3) MG/3ML IN SOLN
3.0000 mL | Freq: Once | RESPIRATORY_TRACT | Status: AC
  Administered 2015-05-26: 3 mL via RESPIRATORY_TRACT

## 2015-05-26 MED ORDER — TIOTROPIUM BROMIDE MONOHYDRATE 18 MCG IN CAPS
18.0000 ug | ORAL_CAPSULE | Freq: Every day | RESPIRATORY_TRACT | Status: DC
  Administered 2015-05-27: 18 ug via RESPIRATORY_TRACT
  Filled 2015-05-26: qty 5

## 2015-05-26 MED ORDER — IPRATROPIUM-ALBUTEROL 0.5-2.5 (3) MG/3ML IN SOLN
3.0000 mL | Freq: Four times a day (QID) | RESPIRATORY_TRACT | Status: DC
  Administered 2015-05-27 (×2): 3 mL via RESPIRATORY_TRACT
  Filled 2015-05-26 (×2): qty 3

## 2015-05-26 MED ORDER — LORAZEPAM 0.5 MG PO TABS
1.0000 mg | ORAL_TABLET | Freq: Three times a day (TID) | ORAL | Status: DC | PRN
  Administered 2015-05-27 (×2): 1 mg via ORAL
  Filled 2015-05-26 (×3): qty 2

## 2015-05-26 MED ORDER — ROFLUMILAST 500 MCG PO TABS: 500.0000 ug | ORAL_TABLET | Freq: Every day | ORAL | Status: DC

## 2015-05-26 MED ORDER — METHYLPREDNISOLONE SODIUM SUCC 125 MG IJ SOLR
60.0000 mg | Freq: Four times a day (QID) | INTRAMUSCULAR | Status: DC
  Administered 2015-05-27 (×2): 60 mg via INTRAVENOUS
  Filled 2015-05-26 (×2): qty 2

## 2015-05-26 MED ORDER — ACETAMINOPHEN 650 MG RE SUPP: 650.0000 mg | Freq: Four times a day (QID) | RECTAL | Status: DC | PRN

## 2015-05-26 MED ORDER — LORATADINE 10 MG PO TABS: 10.0000 mg | ORAL_TABLET | Freq: Every day | ORAL | Status: DC

## 2015-05-26 MED ORDER — HYDROCHLOROTHIAZIDE 25 MG PO TABS: 25.0000 mg | ORAL_TABLET | Freq: Every day | ORAL | Status: DC

## 2015-05-26 MED ORDER — BECLOMETHASONE DIPROPIONATE 80 MCG/ACT IN AERS: 1.0000 | INHALATION_SPRAY | Freq: Two times a day (BID) | RESPIRATORY_TRACT | Status: DC

## 2015-05-26 MED ORDER — IPRATROPIUM-ALBUTEROL 0.5-2.5 (3) MG/3ML IN SOLN
RESPIRATORY_TRACT | Status: AC
  Administered 2015-05-26: 3 mL via RESPIRATORY_TRACT
  Filled 2015-05-26: qty 9

## 2015-05-26 MED ORDER — AZITHROMYCIN 250 MG PO TABS
500.0000 mg | ORAL_TABLET | Freq: Every day | ORAL | Status: AC
  Administered 2015-05-27: 500 mg via ORAL
  Filled 2015-05-26: qty 2

## 2015-05-26 MED ORDER — ACETAMINOPHEN 325 MG PO TABS: 650.0000 mg | ORAL_TABLET | Freq: Four times a day (QID) | ORAL | Status: DC | PRN

## 2015-05-26 MED ORDER — IPRATROPIUM-ALBUTEROL 0.5-2.5 (3) MG/3ML IN SOLN
3.0000 mL | Freq: Once | RESPIRATORY_TRACT | Status: AC
Start: 2015-05-26 — End: 2015-05-26
  Administered 2015-05-26: 3 mL via RESPIRATORY_TRACT

## 2015-05-26 MED ORDER — AMLODIPINE BESYLATE 5 MG PO TABS: 5.0000 mg | ORAL_TABLET | Freq: Every day | ORAL | Status: DC

## 2015-05-26 MED ORDER — ENALAPRIL MALEATE 10 MG PO TABS
20.0000 mg | ORAL_TABLET | Freq: Two times a day (BID) | ORAL | Status: DC
  Administered 2015-05-27: 20 mg via ORAL
  Filled 2015-05-26: qty 2

## 2015-05-26 MED ORDER — METHYLPREDNISOLONE SODIUM SUCC 125 MG IJ SOLR
125.0000 mg | Freq: Once | INTRAMUSCULAR | Status: AC
  Administered 2015-05-26: 125 mg via INTRAVENOUS
  Filled 2015-05-26: qty 2

## 2015-05-26 MED ORDER — MONTELUKAST SODIUM 10 MG PO TABS: 10.0000 mg | ORAL_TABLET | Freq: Every evening | ORAL | Status: DC

## 2015-05-26 MED ORDER — FLUTICASONE PROPIONATE 50 MCG/ACT NA SUSP
1.0000 | Freq: Two times a day (BID) | NASAL | Status: DC
  Administered 2015-05-27: 1 via NASAL
  Filled 2015-05-26: qty 16

## 2015-05-26 MED ORDER — ENOXAPARIN SODIUM 40 MG/0.4ML ~~LOC~~ SOLN
40.0000 mg | SUBCUTANEOUS | Status: DC
  Filled 2015-05-26: qty 0.4

## 2015-05-26 NOTE — ED Notes (Addendum)
Patient refusing to have flu swab done at this time. Dr Hilton Sinclair notified.

## 2015-05-26 NOTE — ED Provider Notes (Signed)
[facility] Emergency Department Provider Note  Time seen: 6:30 PM  I have reviewed the triage vital signs and the nursing notes.   HISTORY  Chief Complaint Chest Pain    HPI Julie Foley is a 60 y.o. female with a past medical history of COPD, CHF, hypertension, depression, anxiety who presents to the emergency department with difficulty breathing. According to the patient approximately 3 hours ago she developed sudden onset of difficulty breathing with wheezes. States it feels like her COPD. Patient wears 4 L of oxygen by nasal cannula 24/7. Patient also states chest discomfort/chest pain but she states she typically gets chest pain with her anxiety during COPD exacerbations.Patient last seen in the emergency department 6 days ago for similar presentation, and ended up signing out AGAINST MEDICAL ADVICE. Patient describes her shortness of breath as severe, chest tightness as moderate.     Past Medical History  Diagnosis Date  . COPD (chronic obstructive pulmonary disease) (HCC)   . CHF (congestive heart failure) (HCC)   . HTN (hypertension)   . GERD (gastroesophageal reflux disease)   . Depression   . Anxiety     Patient Active Problem List   Diagnosis Date Noted  . Sepsis (HCC) 02/16/2015  . COPD with acute exacerbation (HCC) 01/25/2015  . Acute respiratory failure with hypoxemia (HCC) 10/07/2014  . Anxiety 10/07/2014  . HTN (hypertension) 09/23/2014  . GERD (gastroesophageal reflux disease) 09/23/2014  . CHF (congestive heart failure) (HCC) 09/23/2014  . COPD exacerbation (HCC) 08/11/2014    Past Surgical History  Procedure Laterality Date  . Tubal ligation Bilateral     Current Outpatient Rx  Name  Route  Sig  Dispense  Refill  . albuterol (PROVENTIL HFA;VENTOLIN HFA) 108 (90 BASE) MCG/ACT inhaler   Inhalation   Inhale 2 puffs into the lungs every 4 (four) hours as needed for wheezing or shortness of breath.         Marland Kitchen albuterol  (PROVENTIL) (2.5 MG/3ML) 0.083% nebulizer solution   Nebulization   Take 3 mLs (2.5 mg total) by nebulization every 4 (four) hours as needed for wheezing or shortness of breath.   75 mL   2   . amLODipine (NORVASC) 5 MG tablet   Oral   Take 5 mg by mouth daily.         . beclomethasone (QVAR) 80 MCG/ACT inhaler   Inhalation   Inhale 1 puff into the lungs 2 (two) times daily.         . carvedilol (COREG) 12.5 MG tablet   Oral   Take 1 tablet (12.5 mg total) by mouth 2 (two) times daily with a meal.   30 tablet   0   . enalapril (VASOTEC) 20 MG tablet   Oral   Take 20 mg by mouth 2 (two) times daily.          . fluticasone (FLONASE) 50 MCG/ACT nasal spray   Each Nare   Place 1 spray into both nostrils 2 (two) times daily.          . Fluticasone-Salmeterol (ADVAIR) 500-50 MCG/DOSE AEPB   Inhalation   Inhale 1 puff into the lungs 2 (two) times daily.         . hydrochlorothiazide (HYDRODIURIL) 25 MG tablet   Oral   Take 25 mg by mouth daily.         Marland Kitchen ipratropium-albuterol (DUONEB) 0.5-2.5 (3) MG/3ML SOLN   Nebulization   Take 3 mLs by nebulization every  6 (six) hours as needed.   36 mL   0   . levofloxacin (LEVAQUIN) 500 MG tablet   Oral   Take 1 tablet (500 mg total) by mouth daily. Patient not taking: Reported on 04/22/2015   7 tablet   0   . loratadine (CLARITIN) 10 MG tablet   Oral   Take 10 mg by mouth daily.         Marland Kitchen LORazepam (ATIVAN) 0.5 MG tablet   Oral   Take 2 tablets (1 mg total) by mouth every 8 (eight) hours as needed for anxiety.   15 tablet   0   . montelukast (SINGULAIR) 10 MG tablet   Oral   Take 10 mg by mouth every evening.         . nitroGLYCERIN (NITROSTAT) 0.4 MG SL tablet   Sublingual   Place 0.4 mg under the tongue every 5 (five) minutes as needed for chest pain.         . predniSONE (DELTASONE) 20 MG tablet      Take 3 tablets daily x 4 days, then Take 2 tablets daily x 4 days, then Take 1 tablet daily x 4  days Patient not taking: Reported on 04/23/2015   24 tablet   0   . predniSONE (DELTASONE) 20 MG tablet   Oral   Take 3 tablets (60 mg total) by mouth daily.   12 tablet   0   . roflumilast (DALIRESP) 500 MCG TABS tablet   Oral   Take 500 mcg by mouth daily.         Marland Kitchen tiotropium (SPIRIVA) 18 MCG inhalation capsule   Inhalation   Place 18 mcg into inhaler and inhale daily.           Allergies Symbicort and Formoterol fumarate  Family History  Problem Relation Age of Onset  . Cancer Mother   . Hypertension Mother   . Diabetes Mother   . Asthma Son     Social History Social History  Substance Use Topics  . Smoking status: Former Smoker -- 2.00 packs/day for 35 years    Types: Cigarettes  . Smokeless tobacco: None  . Alcohol Use: Yes    Review of Systems Constitutional: Negative for fever. Cardiovascular: Positive for chest tightness Respiratory: Significant shortness of breath Gastrointestinal: Negative for abdominal pain Neurological: Negative for headache 10-point ROS otherwise negative.  ____________________________________________   PHYSICAL EXAM:  VITAL SIGNS: ED Triage Vitals  Enc Vitals Group     BP 05/26/15 1824 170/122 mmHg     Pulse Rate 05/26/15 1824 142     Resp 05/26/15 1824 28     Temp 05/26/15 1824 98.2 F (36.8 C)     Temp Source 05/26/15 1824 Oral     SpO2 05/26/15 1824 93 %     Weight 05/26/15 1824 160 lb (72.576 kg)     Height 05/26/15 1824 5\' 2"  (1.575 m)     Head Cir --      Peak Flow --      Pain Score 05/26/15 1828 8     Pain Loc --      Pain Edu? --      Excl. in GC? --     Constitutional: Alert and oriented. Moderate respiratory distress. Eyes: Normal exam ENT   Head: Normocephalic and atraumatic.   Mouth/Throat: Mucous membranes are moist. Cardiovascular: Normal rate, regular rhythm. No murmur Respiratory: Moderate respiratory distress, sitting upright, refusing to sit back or put  her feet. Moderate  expiratory wheeze bilaterally, decreased breath sounds bilaterally. No obvious rales or rhonchi. Gastrointestinal: Soft and nontender. No distention.   Musculoskeletal: Nontender with normal range of motion in all extremities Neurologic:  Normal speech and language. No gross focal neurologic deficits Skin:  Skin is warm, dry and intact.  Psychiatric: Mood and affect are normal. Speech and behavior are normal. ____________________________________________    EKG  EKG reviewed and interpreted by myself shows sinus tachycardia 149 bpm, narrow QRS, normal axis, largely normal intervals, nonspecific ST changes, difficult interpretation. Hematological interference with the patient's tachypnea on BiPAP.  ____________________________________________    RADIOLOGY  Chest x-ray shows no acute abnormality  ____________________________________________ CRITICAL CARE Performed by: [name]   Total critical care time: 30 minutes  Critical care time was exclusive of separately billable procedures and treating other patients.  Critical care was necessary to treat or prevent imminent or life-threatening deterioration.  Critical care was time spent personally by me on the following activities: development of treatment plan with patient and/or surrogate as well as nursing, discussions with consultants, evaluation of patient's response to treatment, examination of patient, obtaining history from patient or surrogate, ordering and performing treatments and interventions, ordering and review of laboratory studies, ordering and review of radiographic studies, pulse oximetry and re-evaluation of patient's condition.    INITIAL IMPRESSION / ASSESSMENT AND PLAN / ED COURSE  Pertinent labs & imaging results that were available during my care of the patient were reviewed by me and considered in my medical decision making (see chart for details).  Patient presents to the emergency department with  difficulty breathing and chest tightness. Patient has a history of the same multiple times in the past due to her COPD. Patient's examination is most consistent with COPD exacerbation. We will place the patient on BiPAP, dose Solu-Medrol, DuoNeb's, and monitor closely in the emergency department while awaiting lab and chest x-ray results. I reviewed the patient's records, she was last here 6 days ago treated very similarly and ended up signing out AGAINST MEDICAL ADVICE for unknown reasons. Currently the patient is in moderate respiratory distress, significant decreased breath sounds bilaterally with expiratory wheeze on exhalation.    Labs are within normal limits, chest x-ray shows no acute abnormality. Patient remains tachycardic, 95% oxygen saturation on BiPAP. Patient states she is feeling better on BiPAP plan to admit to the hospital for COPD exacerbation.  ____________________________________________   FINAL CLINICAL IMPRESSION(S) / ED DIAGNOSES  COPD exacerbation   [name], MD 05/26/15 [phone]

## 2015-05-26 NOTE — ED Notes (Signed)
Pt provided sandwich tray and drink per request.  Pt up to bathroom.  Very dyspneic on exertion.  Pt moved from BiPap to [facility] to eat.  Pt tolerating well at this time.

## 2015-05-26 NOTE — H&P (Signed)
[facility] [place] at [facility]   PATIENT NAME: Billy Rocco    MR#:  [id number]  DATE OF BIRTH:  01-Sep-1955  DATE OF ADMISSION:  05/26/2015  PRIMARY CARE PHYSICIAN: Pulmonologist at [facility] Dr. Laqueta Jean  REQUESTING/REFERRING PHYSICIAN: Dr. [name]  CHIEF COMPLAINT:   Chief Complaint  Patient presents with  . Chest Pain    HISTORY OF PRESENT ILLNESS:  Latronda Spink  is a 60 y.o. female with history of chronic respiratory failure and COPD on 4 L of oxygen. She presents with a panic attack because she ran out of her Ativan. She is getting weak And had a fall. She gets tightness in her chest when she gets upset and short of breath. She is wheezing and coughing. She was placed on BiPAP in the ER. Currently she is feeling a little bit better. Looking back at numerous times in the ER and signing out AGAINST MEDICAL ADVICE. I told her I was coming to admit her to the hospital and she agreed at this point.  PAST MEDICAL HISTORY:   Past Medical History  Diagnosis Date  . COPD (chronic obstructive pulmonary disease) (HCC)   . CHF (congestive heart failure) (HCC)   . HTN (hypertension)   . GERD (gastroesophageal reflux disease)   . Depression   . Anxiety     PAST SURGICAL HISTORY:   Past Surgical History  Procedure Laterality Date  . Tubal ligation Bilateral     SOCIAL HISTORY:   Social History  Substance Use Topics  . Smoking status: Former Smoker -- 2.00 packs/day for 35 years    Types: Cigarettes  . Smokeless tobacco: Not on file  . Alcohol Use: Yes    FAMILY HISTORY:   Family History  Problem Relation Age of Onset  . Cancer Mother   . Hypertension Mother   . Diabetes Mother   . Asthma Son     DRUG ALLERGIES:   Allergies  Allergen Reactions  . Symbicort [Budesonide-Formoterol Fumarate] Swelling    Patient states tongue swells.  . Formoterol Fumarate Swelling    Patient states her tongue swells.    REVIEW OF SYSTEMS:   CONSTITUTIONAL: No fever, fatigue or weakness. Positive for sweats EYES: No blurred or double vision.  EARS, NOSE, AND THROAT: No tinnitus or ear pain. No sore throat RESPIRATORY: Positive for cough and shortness of breath, positive for wheezing or no hemoptysis.  CARDIOVASCULAR: Positive for chest pain.  GASTROINTESTINAL: No nausea, vomiting, diarrhea or abdominal pain. Patient will has bleeding hemorrhoids GENITOURINARY: No dysuria, hematuria.  ENDOCRINE: No polyuria, nocturia,  HEMATOLOGY: No anemia, easy bruising or bleeding SKIN: No rash or lesion. MUSCULOSKELETAL: No joint pain or arthritis.   NEUROLOGIC: No tingling, numbness, weakness.  PSYCHIATRY: Positive for anxiety, no depression.   MEDICATIONS AT HOME:   Prior to Admission medications   Medication Sig Start Date End Date Taking? Authorizing Provider  albuterol (PROVENTIL HFA;VENTOLIN HFA) 108 (90 BASE) MCG/ACT inhaler Inhale 2 puffs into the lungs every 4 (four) hours as needed for wheezing or shortness of breath.   Yes Historical Provider, MD  albuterol (PROVENTIL) (2.5 MG/3ML) 0.083% nebulizer solution Take 3 mLs (2.5 mg total) by nebulization every 4 (four) hours as needed for wheezing or shortness of breath. 12/23/14  Yes Loleta Rose, MD  amLODipine (NORVASC) 5 MG tablet Take 5 mg by mouth daily.   Yes Historical Provider, MD  beclomethasone (QVAR) 80 MCG/ACT inhaler Inhale 1 puff into the lungs 2 (two) times daily.  Yes Historical Provider, MD  carvedilol (COREG) 12.5 MG tablet Take 1 tablet (12.5 mg total) by mouth 2 (two) times daily with a meal. 08/13/14  Yes Sital Mody, MD  enalapril (VASOTEC) 20 MG tablet Take 20 mg by mouth 2 (two) times daily.    Yes Historical Provider, MD  fluticasone (FLONASE) 50 MCG/ACT nasal spray Place 1 spray into both nostrils 2 (two) times daily.    Yes Historical Provider, MD  Fluticasone-Salmeterol (ADVAIR) 500-50 MCG/DOSE AEPB Inhale 1 puff into the lungs 2 (two) times daily.   Yes  Historical Provider, MD  hydrochlorothiazide (HYDRODIURIL) 25 MG tablet Take 25 mg by mouth daily.   Yes Historical Provider, MD  ipratropium-albuterol (DUONEB) 0.5-2.5 (3) MG/3ML SOLN Take 3 mLs by nebulization every 6 (six) hours as needed. 03/30/15  Yes Gayla Doss, MD  loratadine (CLARITIN) 10 MG tablet Take 10 mg by mouth daily.   Yes Historical Provider, MD  montelukast (SINGULAIR) 10 MG tablet Take 10 mg by mouth every evening.   Yes Historical Provider, MD  nitroGLYCERIN (NITROSTAT) 0.4 MG SL tablet Place 0.4 mg under the tongue every 5 (five) minutes as needed for chest pain.   Yes Historical Provider, MD  OXYGEN Inhale 4 L/min into the lungs continuous.   Yes Historical Provider, MD  roflumilast (DALIRESP) 500 MCG TABS tablet Take 500 mcg by mouth daily.   Yes Historical Provider, MD  tiotropium (SPIRIVA) 18 MCG inhalation capsule Place 18 mcg into inhaler and inhale daily.   Yes Historical Provider, MD  LORazepam (ATIVAN) 0.5 MG tablet Take 2 tablets (1 mg total) by mouth every 8 (eight) hours as needed for anxiety. 04/09/15   Irean Hong, MD      VITAL SIGNS:  Blood pressure 156/133, pulse 149, temperature 98.2 F (36.8 C), temperature source Oral, resp. rate 22, height  (1.575 m), weight 72.576 kg (160 lb), SpO2 96 %.  PHYSICAL EXAMINATION:  GENERAL:  60 y.o.-year-old patient lying in the bed with no acute distress.  EYES: Pupils equal, round, reactive to light and accommodation. No scleral icterus. Extraocular muscles intact.  HEENT: Head atraumatic, normocephalic. Oropharynx and nasopharynx clear.  NECK:  Supple, no jugular venous distention. No thyroid enlargement, no tenderness.  LUNGS: Decreased breath sounds bilaterally, positive wheezing throughout entire lung field, no rales,rhonchi or crepitation. Positive use of accessory muscles of respiration.  CARDIOVASCULAR: S1, S2 tachycardia. No murmurs, rubs, or gallops.  ABDOMEN: Soft, nontender, nondistended. Bowel sounds  present. No organomegaly or mass.  EXTREMITIES: Trace pedal edema, no cyanosis, or clubbing.  NEUROLOGIC: Cranial nerves II through XII are intact. Muscle strength 5/5 in all extremities. Sensation intact. Gait not checked.  PSYCHIATRIC: The patient is alert and oriented x 3.  SKIN: No rash, lesion, or ulcer.   LABORATORY PANEL:   CBC  Recent Labs Lab 05/26/15 1837  WBC 10.6  HGB 13.6  HCT 41.4  PLT 385   ------------------------------------------------------------------------------------------------------------------  Chemistries   Recent Labs Lab 05/26/15 1837  NA 139  K 4.6  CL 103  CO2 30  GLUCOSE 134*  BUN 14  CREATININE 0.83  CALCIUM 8.7*  AST 21  ALT 22  ALKPHOS 68  BILITOT 0.3   ------------------------------------------------------------------------------------------------------------------  Cardiac Enzymes  Recent Labs Lab 05/26/15 1837  TROPONINI <0.03   ------------------------------------------------------------------------------------------------------------------  RADIOLOGY:  Dg Chest Portable 1 View  05/26/2015  CLINICAL DATA:  Worsening shortness of breath with cough and chest pain. EXAM: PORTABLE CHEST 1 VIEW COMPARISON:  Chest radiograph 05/20/2015  FINDINGS: Monitoring leads overlie the patient. Stable cardiac and mediastinal contours. No consolidative pulmonary opacities. No pleural effusion or pneumothorax. IMPRESSION: No acute cardiopulmonary process. Electronically Signed   By: Drew  Davis M.D.   On: 02/25/2017Annia Belt  EKG:   Sinus tachycardia 149 bpm  IMPRESSION AND PLAN:   1. Acute respiratory failure on chronic with hypoxia. Patient placed on BiPAP 30% FiO2 in order to move better air. Patient normally is on 4 L nasal cannula. Anyone coming in with respiratory symptoms I'm doing a flu swab on. Pulmonary consultation in the a.m. 2. COPD exacerbation- 125 mg of IV Solu-Medrol given in the ER I'll continue 60 mg IV every 6 hours.  Nebulizer treatments. 3. Anxiety L continue oral Ativan only 4. Tachycardia- likely with COPD exacerbation. Continue metoprolol 5. Chest tightness- I will get serial cardiac enzymes to ROM myocardial infarction and monitor on telemetry 6. Essential hypertension- continue medications 7. History of CHF. Currently no signs  All the records are reviewed and case discussed with ED provider. Management plans discussed with the patient, family and they are in agreement.  CODE STATUS: Full code  TOTAL TIME TAKING CARE OF THIS PATIENT: 55 minutes, patient will be admitted to the critical care unit to watch respiratory status closely.    Alford Highland M.D on 05/26/2015 at 9:10 PM  Between 7am to 6pm - Pager - [phone]  After 6pm call admission pager [phone]  [facility]  Office  [phone]  CC: Primary care physician; Dr. Laqueta Jean at [facility]

## 2015-05-26 NOTE — ED Notes (Signed)
States waas coming in for chest pain, en route ran out of 02, severely SOB on arrival.

## 2015-05-27 LAB — INFLUENZA PANEL BY PCR (TYPE A & B)
H1N1 flu by pcr: NOT DETECTED
Influenza A By PCR: NEGATIVE
Influenza B By PCR: NEGATIVE

## 2015-05-27 LAB — CBC
HEMATOCRIT: 36.6 % (ref 35.0–47.0)
HEMOGLOBIN: 12.2 g/dL (ref 12.0–16.0)
MCH: 32 pg (ref 26.0–34.0)
MCHC: 33.5 g/dL (ref 32.0–36.0)
MCV: 95.5 fL (ref 80.0–100.0)
Platelets: 329 10*3/uL (ref 150–440)
RBC: 3.83 MIL/uL (ref 3.80–5.20)
RDW: 13.2 % (ref 11.5–14.5)
WBC: 8.7 10*3/uL (ref 3.6–11.0)

## 2015-05-27 LAB — BASIC METABOLIC PANEL
ANION GAP: 6 (ref 5–15)
BUN: 14 mg/dL (ref 6–20)
CO2: 31 mmol/L (ref 22–32)
Calcium: 8.9 mg/dL (ref 8.9–10.3)
Chloride: 105 mmol/L (ref 101–111)
Creatinine, Ser: 0.81 mg/dL (ref 0.44–1.00)
GFR calc Af Amer: >60 mL/min (ref >60)
GFR calc non Af Amer: >60 mL/min (ref >60)
GLUCOSE: 127 mg/dL — AB (ref 65–99)
POTASSIUM: 4.9 mmol/L (ref 3.5–5.1)
Sodium: 142 mmol/L (ref 135–145)

## 2015-05-27 LAB — GLUCOSE, CAPILLARY: Glucose-Capillary: 142 mg/dL — ABNORMAL HIGH (ref 65–99)

## 2015-05-27 LAB — MRSA PCR SCREENING: MRSA BY PCR: NEGATIVE

## 2015-05-27 LAB — TROPONIN I

## 2015-05-27 MED ORDER — CHLORHEXIDINE GLUCONATE 0.12 % MT SOLN
15.0000 mL | Freq: Two times a day (BID) | OROMUCOSAL | Status: DC
  Administered 2015-05-27: 15 mL via OROMUCOSAL
  Filled 2015-05-27: qty 15

## 2015-05-27 MED ORDER — BUDESONIDE 0.25 MG/2ML IN SUSP
0.2500 mg | Freq: Two times a day (BID) | RESPIRATORY_TRACT | Status: DC
  Administered 2015-05-27: 0.25 mg via RESPIRATORY_TRACT
  Filled 2015-05-27: qty 2

## 2015-05-27 MED ORDER — LORAZEPAM 2 MG/ML IJ SOLN
1.0000 mg | Freq: Once | INTRAMUSCULAR | Status: AC
  Administered 2015-05-27: 1 mg via INTRAVENOUS
  Filled 2015-05-27: qty 1

## 2015-05-27 MED ORDER — CETYLPYRIDINIUM CHLORIDE 0.05 % MT LIQD: 7.0000 mL | Freq: Two times a day (BID) | OROMUCOSAL | Status: DC

## 2015-05-27 NOTE — Progress Notes (Signed)
Pt come up from ED. Pt off of Bipap on 6L [place]. Tolerating fairly well.

## 2015-05-27 NOTE — Progress Notes (Signed)
Ms. Hommes left AMA with her boyfriend. All belongings with patient. Right arm IV removed. Dr.Gouru aware. Pt sent to visitors entrance in wheelchair.

## 2015-05-27 NOTE — Progress Notes (Signed)
Ms. Rupard signed AMA form. Dr. Amado Coe aware.

## 2015-05-27 NOTE — Progress Notes (Signed)
[facility] - [place] at [facility]   PATIENT NAME: Julie Foley    MR#:  [id number]  DATE OF BIRTH:  April 07, 1955  SUBJECTIVE:  CHIEF COMPLAINT:  Pt is sob and not wearing bipap, wants to leave hospital AMA, asking for prescriptions - ativan, husband at bedside  REVIEW OF SYSTEMS:  Limited CONSTITUTIONAL: No fever, fatigue or weakness.  EYES: No blurred or double vision.  RESPIRATORY: has cough, shortness of breath, wheezing , denies  hemoptysis.  CARDIOVASCULAR: No chest pain, orthopnea, edema.  GASTROINTESTINAL: No nausea, vomiting, diarrhea or abdominal pain.  NEUROLOGIC: No tingling, numbness, weakness.  PSYCHIATRY: Has anxiety .   DRUG ALLERGIES:   Allergies  Allergen Reactions  . Symbicort [Budesonide-Formoterol Fumarate] Swelling    Patient states tongue swells.  . Formoterol Fumarate Swelling    Patient states her tongue swells.    VITALS:  Blood pressure 136/92, pulse 101, temperature 97.9 F (36.6 C), temperature source Axillary, resp. rate 14, height  (1.575 m), weight 72.576 kg (160 lb), SpO2 98 %.  PHYSICAL EXAMINATION:  GENERAL:  60 y.o.-year-old patient lying in the bed with no acute distress.  EYES: Pupils equal, round, reactive to light and accommodation. No scleral icterus. Extraocular muscles intact.  HEENT: Head atraumatic, normocephalic. Oropharynx and nasopharynx clear.  NECK:  Supple, no jugular venous distention. No thyroid enlargement, no tenderness.  LUNGS: Diminished breath sounds bilaterally, with  wheezing, no rales,rhonchi or crepitation. No use of accessory muscles of respiration.  CARDIOVASCULAR: S1, S2 normal. No murmurs, rubs, or gallops.  ABDOMEN: Soft, nontender, nondistended. Bowel sounds present. No organomegaly or mass.  EXTREMITIES: No pedal edema, cyanosis, or clubbing.   PSYCHIATRIC: The patient is alert and oriented x 3.     LABORATORY PANEL:   CBC  Recent Labs Lab 05/27/15 0416  WBC 8.7   HGB 12.2  HCT 36.6  PLT 329   ------------------------------------------------------------------------------------------------------------------  Chemistries   Recent Labs Lab 05/26/15 1837 05/27/15 0416  NA 139 142  K 4.6 4.9  CL 103 105  CO2 30 31  GLUCOSE 134* 127*  BUN 14 14  CREATININE 0.83 0.81  CALCIUM 8.7* 8.9  AST 21  --   ALT 22  --   ALKPHOS 68  --   BILITOT 0.3  --    ------------------------------------------------------------------------------------------------------------------  Cardiac Enzymes  Recent Labs Lab 05/27/15 0416  TROPONINI <0.03   ------------------------------------------------------------------------------------------------------------------  RADIOLOGY:  Dg Chest Portable 1 View  05/26/2015  CLINICAL DATA:  Worsening shortness of breath with cough and chest pain. EXAM: PORTABLE CHEST 1 VIEW COMPARISON:  Chest radiograph 05/20/2015 FINDINGS: Monitoring leads overlie the patient. Stable cardiac and mediastinal contours. No consolidative pulmonary opacities. No pleural effusion or pneumothorax. IMPRESSION: No acute cardiopulmonary process. Electronically Signed   By: Annia Belt M.D.   On: 05/26/2015 19:11    EKG:   Orders placed or performed during the hospital encounter of 05/26/15  . ED EKG  . ED EKG  . EKG 12-Lead  . EKG 12-Lead    ASSESSMENT AND PLAN:   1. Acute respiratory failure on chronic with hypoxia.  Patient placed on BiPAP 30% FiO2, refusing BIPAP Wants to signout  AMA   Patient normally is on 4 L nasal cannula.   Pulmonary consultation is pending  2. COPD exacerbation- 125 mg of IV Solu-Medrol given in the ER I'll continue 60 mg IV every 6 hours. Nebulizer treatments . 3. Anxiety L continue oral Ativan only Asking ativan prescriptions to leave AMA  4. Tachycardia- likely with COPD exacerbation. Continue metoprolol  5. Chest tightness-  Ruled out AMI with neg troponins monitor on telemetry  6. Essential  hypertension- continue medications  7. History of CHF.   Patient is on chronic 4 L nasal cannula.      All the records are reviewed and case discussed with Care Management/Social Workerr. Management plans discussed with the patient, family and they are in agreement.  CODE STATUS: fc  TOTAL TIME TAKING CARE OF THIS PATIENT: 33 minutes.   Patient wants to leave hospital Bess Kinds M.D on 05/27/2015 at 8:53 PM  Between 7am to 6pm - Pager - [phone] After 6pm go to [email or web address] - password [email or web address]  [facility]  Office  [phone]  CC: Primary care physician; Pcp Not In System

## 2015-05-27 NOTE — Progress Notes (Signed)
Received report from [place], RN in ED.  Pt. Arrived with Bipap on @ 30%, with labored breathing but stable vital signs.

## 2015-05-27 NOTE — Discharge Summary (Signed)
[facility] - [place] at [facility]   PATIENT NAME: Julie Foley    MR#:  [id number]  DATE OF BIRTH:  23-Jun-1955  DATE OF ADMISSION:  05/26/2015 ADMITTING PHYSICIAN: Alford Highland, MD  DATE OF leaving AGAINST MEDICAL ADVICE: 05/27/2015  9:18 AM  PRIMARY CARE PHYSICIAN: Pcp Not In System    ADMISSION DIAGNOSIS:  COPD exacerbation (HCC) [J44.1]  DISCHARGE DIAGNOSIS:  Active Problems:   Acute respiratory failure (HCC)   SECONDARY DIAGNOSIS:   Past Medical History  Diagnosis Date  . COPD (chronic obstructive pulmonary disease) (HCC)   . CHF (congestive heart failure) (HCC)   . HTN (hypertension)   . GERD (gastroesophageal reflux disease)   . Depression   . Anxiety     HOSPITAL COURSE:   1. Acute respiratory failure on chronic respiratory failure with hypoxia. The patient was placed on BiPAP in the ER and admitted to the CCU stepdown. Normally she wears 4 L nasal cannula. Flu swab was negative. 2. COPD exacerbation 125 mg of Solu-Medrol was given in the ER and I continued 60 mg every 6 hours and nebulizer treatments. 3. Anxiety patient states that she ran out of her Ativan at home 4. Tachycardia- heart rate was in the 140s when she came in was down to 101 when she signed out AMA 5. Chest tightness- cardiac enzymes 3 were negative 6. Essential hypertension can continue her usual medications 7. History of CHF- currently no signs.  This patient has signed out AGAINST MEDICAL ADVICE numerous times in the ER and now this time after admission.  DISCHARGE CONDITIONS:   Unknown since she signed out AGAINST MEDICAL ADVICE CONSULTS OBTAINED:  She left prior to critical care specialist consultation  DRUG ALLERGIES:   Allergies  Allergen Reactions  . Symbicort [Budesonide-Formoterol Fumarate] Swelling    Patient states tongue swells.  . Formoterol Fumarate Swelling    Patient states her tongue swells.    DISCHARGE MEDICATIONS:   Discharge  Medication List as of 05/27/2015  9:18 AM    CONTINUE these medications which have NOT CHANGED   Details  albuterol (PROVENTIL HFA;VENTOLIN HFA) 108 (90 BASE) MCG/ACT inhaler Inhale 2 puffs into the lungs every 4 (four) hours as needed for wheezing or shortness of breath., Until Discontinued, Historical Med    albuterol (PROVENTIL) (2.5 MG/3ML) 0.083% nebulizer solution Take 3 mLs (2.5 mg total) by nebulization every 4 (four) hours as needed for wheezing or shortness of breath., Starting 12/23/2014, Until Discontinued, Print    amLODipine (NORVASC) 5 MG tablet Take 5 mg by mouth daily., Until Discontinued, Historical Med    beclomethasone (QVAR) 80 MCG/ACT inhaler Inhale 1 puff into the lungs 2 (two) times daily., Until Discontinued, Historical Med    carvedilol (COREG) 12.5 MG tablet Take 1 tablet (12.5 mg total) by mouth 2 (two) times daily with a meal., Starting 08/13/2014, Until Discontinued, Normal    enalapril (VASOTEC) 20 MG tablet Take 20 mg by mouth 2 (two) times daily. , Until Discontinued, Historical Med    fluticasone (FLONASE) 50 MCG/ACT nasal spray Place 1 spray into both nostrils 2 (two) times daily. , Until Discontinued, Historical Med    Fluticasone-Salmeterol (ADVAIR) 500-50 MCG/DOSE AEPB Inhale 1 puff into the lungs 2 (two) times daily., Until Discontinued, Historical Med    hydrochlorothiazide (HYDRODIURIL) 25 MG tablet Take 25 mg by mouth daily., Until Discontinued, Historical Med    ipratropium-albuterol (DUONEB) 0.5-2.5 (3) MG/3ML SOLN Take 3 mLs by nebulization every 6 (six) hours as  needed., Starting 03/30/2015, Until Discontinued, Print    loratadine (CLARITIN) 10 MG tablet Take 10 mg by mouth daily., Until Discontinued, Historical Med    montelukast (SINGULAIR) 10 MG tablet Take 10 mg by mouth every evening., Until Discontinued, Historical Med    nitroGLYCERIN (NITROSTAT) 0.4 MG SL tablet Place 0.4 mg under the tongue every 5 (five) minutes as needed for chest  pain., Until Discontinued, Historical Med    OXYGEN Inhale 4 L/min into the lungs continuous., Until Discontinued, Historical Med    roflumilast (DALIRESP) 500 MCG TABS tablet Take 500 mcg by mouth daily., Until Discontinued, Historical Med    tiotropium (SPIRIVA) 18 MCG inhalation capsule Place 18 mcg into inhaler and inhale daily., Until Discontinued, Historical Med    LORazepam (ATIVAN) 0.5 MG tablet Take 2 tablets (1 mg total) by mouth every 8 (eight) hours as needed for anxiety., Starting 04/09/2015, Until Discontinued, Print         DISCHARGE INSTRUCTIONS:    follow-up with your medical doctor  If you experience worsening of your admission symptoms, develop shortness of breath, life threatening emergency, suicidal or homicidal thoughts you must seek medical attention immediately by calling 911 or calling your MD immediately  if symptoms less severe.  You Must read complete instructions/literature along with all the possible adverse reactions/side effects for all the Medicines you take and that have been prescribed to you. Take any new Medicines after you have completely understood and accept all the possible adverse reactions/side effects.   Please note  You were cared for by a hospitalist during your hospital stay. If you have any questions about your discharge medications or the care you received while you were in the hospital after you are discharged, you can call the unit and asked to speak with the hospitalist on call if the hospitalist that took care of you is not available. Once you are discharged, your primary care physician will handle any further medical issues. Please note that NO REFILLS for any discharge medications will be authorized once you are discharged, as it is imperative that you return to your primary care physician (or establish a relationship with a primary care physician if you do not have one) for your aftercare needs so that they can reassess your need for  medications and monitor your lab values.    Today   CHIEF COMPLAINT:   Chief Complaint  Patient presents with  . Chest Pain    VITAL SIGNS:  Blood pressure 136/92, pulse 101, temperature 97.9 F (36.6 C), temperature source Axillary, resp. rate 14, height  (1.575 m), weight 72.576 kg (160 lb), SpO2 98 %.   DATA REVIEW:   CBC  Recent Labs Lab 05/27/15 0416  WBC 8.7  HGB 12.2  HCT 36.6  PLT 329    Chemistries   Recent Labs Lab 05/26/15 1837 05/27/15 0416  NA 139 142  K 4.6 4.9  CL 103 105  CO2 30 31  GLUCOSE 134* 127*  BUN 14 14  CREATININE 0.83 0.81  CALCIUM 8.7* 8.9  AST 21  --   ALT 22  --   ALKPHOS 68  --   BILITOT 0.3  --     Cardiac Enzymes  Recent Labs Lab 05/27/15 0416  TROPONINI <0.03    Microbiology Results  Results for orders placed or performed during the hospital encounter of 05/26/15  MRSA PCR Screening     Status: None   Collection Time: 05/27/15 12:43 AM  Result Value Ref  Range Status   MRSA by PCR NEGATIVE NEGATIVE Final    Comment:        The GeneXpert MRSA Assay (FDA approved for NASAL specimens only), is one component of a comprehensive MRSA colonization surveillance program. It is not intended to diagnose MRSA infection nor to guide or monitor treatment for MRSA infections.     RADIOLOGY:  Dg Chest Portable 1 View  05/26/2015  CLINICAL DATA:  Worsening shortness of breath with cough and chest pain. EXAM: PORTABLE CHEST 1 VIEW COMPARISON:  Chest radiograph 05/20/2015 FINDINGS: Monitoring leads overlie the patient. Stable cardiac and mediastinal contours. No consolidative pulmonary opacities. No pleural effusion or pneumothorax. IMPRESSION: No acute cardiopulmonary process. Electronically Signed   By: Annia Belt M.D.   On: 05/26/2015 19:11     CODE STATUS:  Code Status History    Date Active Date Inactive Code Status Order ID Comments User Context   05/26/2015  8:18 PM 05/27/2015 12:18 PM Full Code [id number]   Alford Highland, MD ED   02/16/2015  4:54 AM 02/17/2015  1:51 PM Full Code [id number]  Arnaldo Natal, MD Inpatient   01/25/2015  7:51 AM 01/25/2015  8:38 PM Full Code [id number]  Oralia Manis, MD Inpatient   01/02/2015 11:42 PM 01/03/2015  6:47 PM Full Code [id number]  Ramonita Lab, MD Inpatient   12/10/2014  9:32 PM 12/11/2014  1:12 PM Full Code [id number]  Auburn Bilberry, MD Inpatient   10/07/2014  3:54 AM 10/09/2014  1:35 PM Full Code [id number]  Oralia Manis, MD Inpatient   09/23/2014  3:18 AM 09/23/2014  1:57 PM Full Code [id number]  Oralia Manis, MD Inpatient   08/11/2014  6:08 PM 08/13/2014  3:53 PM Full Code [id number]  Adrian Saran, MD Inpatient     Patient left AGAINST MEDICAL ADVICE prior to being seen on the day of her leaving.  Alford Highland M.D on 05/27/2015 at 1:41 PM  Between 7am to 6pm - Pager - [phone]  After 6pm go to [email or web address] - password [email or web address]  [facility]  Office  [phone]  CC: Primary care physician; Pcp Not In System

## 2015-05-30 ENCOUNTER — Encounter: Payer: Self-pay | Admitting: Emergency Medicine

## 2015-05-30 ENCOUNTER — Emergency Department: Payer: Medicaid Other

## 2015-05-30 ENCOUNTER — Inpatient Hospital Stay
Admission: EM | Admit: 2015-05-30 | Discharge: 2015-05-31 | DRG: 190 | Disposition: A | Discharge status: Home / Self Care | Payer: Medicaid Other | Attending: Specialist | Admitting: Specialist

## 2015-05-30 DIAGNOSIS — Z9981 Dependence on supplemental oxygen: Secondary | ICD-10-CM | POA: Diagnosis not present

## 2015-05-30 DIAGNOSIS — Z888 Allergy status to other drugs, medicaments and biological substances status: Secondary | ICD-10-CM | POA: Diagnosis not present

## 2015-05-30 DIAGNOSIS — J9620 Acute and chronic respiratory failure, unspecified whether with hypoxia or hypercapnia: Secondary | ICD-10-CM | POA: Diagnosis present

## 2015-05-30 DIAGNOSIS — Z7951 Long term (current) use of inhaled steroids: Secondary | ICD-10-CM

## 2015-05-30 DIAGNOSIS — Z9119 Patient's noncompliance with other medical treatment and regimen: Secondary | ICD-10-CM

## 2015-05-30 DIAGNOSIS — I5032 Chronic diastolic (congestive) heart failure: Secondary | ICD-10-CM | POA: Diagnosis present

## 2015-05-30 DIAGNOSIS — Z79899 Other long term (current) drug therapy: Secondary | ICD-10-CM | POA: Diagnosis not present

## 2015-05-30 DIAGNOSIS — J441 Chronic obstructive pulmonary disease with (acute) exacerbation: Principal | ICD-10-CM | POA: Diagnosis present

## 2015-05-30 DIAGNOSIS — F419 Anxiety disorder, unspecified: Secondary | ICD-10-CM | POA: Diagnosis present

## 2015-05-30 DIAGNOSIS — I110 Hypertensive heart disease with heart failure: Secondary | ICD-10-CM | POA: Diagnosis present

## 2015-05-30 DIAGNOSIS — F329 Major depressive disorder, single episode, unspecified: Secondary | ICD-10-CM | POA: Diagnosis present

## 2015-05-30 DIAGNOSIS — K219 Gastro-esophageal reflux disease without esophagitis: Secondary | ICD-10-CM | POA: Diagnosis present

## 2015-05-30 DIAGNOSIS — Z87891 Personal history of nicotine dependence: Secondary | ICD-10-CM | POA: Diagnosis not present

## 2015-05-30 DIAGNOSIS — Z7952 Long term (current) use of systemic steroids: Secondary | ICD-10-CM | POA: Diagnosis not present

## 2015-05-30 HISTORY — DX: Chronic respiratory failure, unspecified whether with hypoxia or hypercapnia: J96.10

## 2015-05-30 LAB — CBC WITH DIFFERENTIAL/PLATELET
BASOS ABS: 0 10*3/uL (ref 0–0.1)
Basophils Relative: 0 %
EOS PCT: 1 %
Eosinophils Absolute: 0.1 10*3/uL (ref 0–0.7)
HCT: 39 % (ref 35.0–47.0)
Hemoglobin: 12.9 g/dL (ref 12.0–16.0)
LYMPHS ABS: 0.6 10*3/uL — AB (ref 1.0–3.6)
LYMPHS PCT: 9 %
MCH: 31.5 pg (ref 26.0–34.0)
MCHC: 33.2 g/dL (ref 32.0–36.0)
MCV: 95 fL (ref 80.0–100.0)
MONO ABS: 0.1 10*3/uL — AB (ref 0.2–0.9)
Monocytes Relative: 2 %
Neutro Abs: 5.3 10*3/uL (ref 1.4–6.5)
Neutrophils Relative %: 88 %
PLATELETS: 340 10*3/uL (ref 150–440)
RBC: 4.11 MIL/uL (ref 3.80–5.20)
RDW: 12.8 % (ref 11.5–14.5)
WBC: 6.1 10*3/uL (ref 3.6–11.0)

## 2015-05-30 LAB — BRAIN NATRIURETIC PEPTIDE: B Natriuretic Peptide: 34 pg/mL (ref 0.0–100.0)

## 2015-05-30 LAB — COMPREHENSIVE METABOLIC PANEL
ALBUMIN: 4.2 g/dL (ref 3.5–5.0)
ALT: 24 U/L (ref 14–54)
AST: 19 U/L (ref 15–41)
Alkaline Phosphatase: 62 U/L (ref 38–126)
Anion gap: 4 — ABNORMAL LOW (ref 5–15)
BILIRUBIN TOTAL: 0.5 mg/dL (ref 0.3–1.2)
BUN: 14 mg/dL (ref 6–20)
CO2: 35 mmol/L — ABNORMAL HIGH (ref 22–32)
CREATININE: 0.79 mg/dL (ref 0.44–1.00)
Calcium: 9.1 mg/dL (ref 8.9–10.3)
Chloride: 99 mmol/L — ABNORMAL LOW (ref 101–111)
GFR calc Af Amer: >60 mL/min (ref >60)
GFR calc non Af Amer: >60 mL/min (ref >60)
GLUCOSE: 136 mg/dL — AB (ref 65–99)
POTASSIUM: 4.9 mmol/L (ref 3.5–5.1)
Sodium: 138 mmol/L (ref 135–145)
TOTAL PROTEIN: 7.2 g/dL (ref 6.5–8.1)

## 2015-05-30 MED ORDER — ALBUTEROL SULFATE (2.5 MG/3ML) 0.083% IN NEBU
2.5000 mg | INHALATION_SOLUTION | Freq: Once | RESPIRATORY_TRACT | Status: AC
  Administered 2015-05-30: 2.5 mg via RESPIRATORY_TRACT

## 2015-05-30 MED ORDER — ALBUTEROL (5 MG/ML) CONTINUOUS INHALATION SOLN: 10.0000 mg/h | INHALATION_SOLUTION | RESPIRATORY_TRACT | Status: DC

## 2015-05-30 MED ORDER — SODIUM CHLORIDE 0.9 % IV SOLN: 250.0000 mL | INTRAVENOUS | Status: DC | PRN

## 2015-05-30 MED ORDER — BECLOMETHASONE DIPROPIONATE 80 MCG/ACT IN AERS: 1.0000 | INHALATION_SPRAY | Freq: Two times a day (BID) | RESPIRATORY_TRACT | Status: DC

## 2015-05-30 MED ORDER — ENALAPRIL MALEATE 10 MG PO TABS
20.0000 mg | ORAL_TABLET | Freq: Two times a day (BID) | ORAL | Status: DC
  Administered 2015-05-31: 20 mg via ORAL
  Filled 2015-05-30 (×4): qty 2

## 2015-05-30 MED ORDER — CARVEDILOL 12.5 MG PO TABS
12.5000 mg | ORAL_TABLET | Freq: Two times a day (BID) | ORAL | Status: DC
  Administered 2015-05-30 – 2015-05-31 (×2): 12.5 mg via ORAL
  Filled 2015-05-30 (×5): qty 1

## 2015-05-30 MED ORDER — BISACODYL 5 MG PO TBEC: 5.0000 mg | DELAYED_RELEASE_TABLET | Freq: Every day | ORAL | Status: DC | PRN

## 2015-05-30 MED ORDER — SODIUM CHLORIDE 0.9% FLUSH
3.0000 mL | Freq: Two times a day (BID) | INTRAVENOUS | Status: DC
  Administered 2015-05-30 – 2015-05-31 (×2): 3 mL via INTRAVENOUS

## 2015-05-30 MED ORDER — ENOXAPARIN SODIUM 40 MG/0.4ML ~~LOC~~ SOLN
40.0000 mg | SUBCUTANEOUS | Status: DC
  Administered 2015-05-30: 22:00:00 40 mg via SUBCUTANEOUS
  Filled 2015-05-30: qty 0.4

## 2015-05-30 MED ORDER — FLUTICASONE PROPIONATE 50 MCG/ACT NA SUSP
1.0000 | Freq: Two times a day (BID) | NASAL | Status: DC
  Administered 2015-05-30 – 2015-05-31 (×2): 1 via NASAL
  Filled 2015-05-30: qty 16

## 2015-05-30 MED ORDER — TIOTROPIUM BROMIDE MONOHYDRATE 18 MCG IN CAPS: 18.0000 ug | ORAL_CAPSULE | Freq: Every day | RESPIRATORY_TRACT | Status: DC

## 2015-05-30 MED ORDER — IPRATROPIUM-ALBUTEROL 0.5-2.5 (3) MG/3ML IN SOLN
3.0000 mL | RESPIRATORY_TRACT | Status: DC
  Administered 2015-05-30 – 2015-05-31 (×4): 3 mL via RESPIRATORY_TRACT
  Filled 2015-05-30 (×5): qty 3

## 2015-05-30 MED ORDER — AZITHROMYCIN 250 MG PO TABS
500.0000 mg | ORAL_TABLET | Freq: Every day | ORAL | Status: DC
  Administered 2015-05-30 – 2015-05-31 (×2): 500 mg via ORAL
  Filled 2015-05-30 (×2): qty 2

## 2015-05-30 MED ORDER — PREDNISONE 20 MG PO TABS: 60.0000 mg | ORAL_TABLET | Freq: Every day | ORAL | Status: DC

## 2015-05-30 MED ORDER — AMLODIPINE BESYLATE 5 MG PO TABS
5.0000 mg | ORAL_TABLET | Freq: Every day | ORAL | Status: DC
  Administered 2015-05-31: 5 mg via ORAL
  Filled 2015-05-30: qty 1

## 2015-05-30 MED ORDER — MOMETASONE FURO-FORMOTEROL FUM 200-5 MCG/ACT IN AERO: 2.0000 | INHALATION_SPRAY | Freq: Two times a day (BID) | RESPIRATORY_TRACT | Status: DC

## 2015-05-30 MED ORDER — MONTELUKAST SODIUM 10 MG PO TABS
10.0000 mg | ORAL_TABLET | Freq: Every day | ORAL | Status: DC
  Administered 2015-05-30: 10 mg via ORAL
  Filled 2015-05-30: qty 1

## 2015-05-30 MED ORDER — ONDANSETRON HCL 4 MG PO TABS: 4.0000 mg | ORAL_TABLET | Freq: Four times a day (QID) | ORAL | Status: DC | PRN

## 2015-05-30 MED ORDER — ROFLUMILAST 500 MCG PO TABS
500.0000 ug | ORAL_TABLET | Freq: Every day | ORAL | Status: DC
  Administered 2015-05-30: 500 ug via ORAL
  Filled 2015-05-30 (×2): qty 1

## 2015-05-30 MED ORDER — IPRATROPIUM BROMIDE 0.02 % IN SOLN
0.5000 mg | Freq: Once | RESPIRATORY_TRACT | Status: AC
  Administered 2015-05-30: 0.5 mg via RESPIRATORY_TRACT
  Filled 2015-05-30: qty 2.5

## 2015-05-30 MED ORDER — PREDNISONE 20 MG PO TABS
60.0000 mg | ORAL_TABLET | Freq: Once | ORAL | Status: AC
  Administered 2015-05-30: 60 mg via ORAL
  Filled 2015-05-30: qty 3

## 2015-05-30 MED ORDER — NITROGLYCERIN 0.4 MG SL SUBL: 0.4000 mg | SUBLINGUAL_TABLET | SUBLINGUAL | Status: DC | PRN

## 2015-05-30 MED ORDER — LORAZEPAM 1 MG PO TABS
1.0000 mg | ORAL_TABLET | Freq: Three times a day (TID) | ORAL | Status: DC | PRN
  Administered 2015-05-30: 19:00:00 1 mg via ORAL
  Filled 2015-05-30: qty 1

## 2015-05-30 MED ORDER — METHYLPREDNISOLONE SODIUM SUCC 125 MG IJ SOLR
60.0000 mg | Freq: Four times a day (QID) | INTRAMUSCULAR | Status: DC
  Administered 2015-05-30 – 2015-05-31 (×3): 60 mg via INTRAVENOUS
  Filled 2015-05-30 (×3): qty 2

## 2015-05-30 MED ORDER — ALBUTEROL SULFATE (2.5 MG/3ML) 0.083% IN NEBU: 2.5000 mg | INHALATION_SOLUTION | RESPIRATORY_TRACT | Status: DC | PRN

## 2015-05-30 MED ORDER — POLYETHYLENE GLYCOL 3350 17 G PO PACK: 17.0000 g | PACK | Freq: Every day | ORAL | Status: DC | PRN

## 2015-05-30 MED ORDER — ALBUTEROL SULFATE (2.5 MG/3ML) 0.083% IN NEBU
INHALATION_SOLUTION | RESPIRATORY_TRACT | Status: AC
  Filled 2015-05-30: qty 12

## 2015-05-30 MED ORDER — FLUTICASONE PROPIONATE HFA 110 MCG/ACT IN AERO
2.0000 | INHALATION_SPRAY | Freq: Two times a day (BID) | RESPIRATORY_TRACT | Status: DC
  Administered 2015-05-30 – 2015-05-31 (×2): 2 via RESPIRATORY_TRACT
  Filled 2015-05-30: qty 12

## 2015-05-30 MED ORDER — ACETAMINOPHEN 650 MG RE SUPP: 650.0000 mg | Freq: Four times a day (QID) | RECTAL | Status: DC | PRN

## 2015-05-30 MED ORDER — LORATADINE 10 MG PO TABS
10.0000 mg | ORAL_TABLET | Freq: Every day | ORAL | Status: DC
  Administered 2015-05-30 – 2015-05-31 (×2): 10 mg via ORAL
  Filled 2015-05-30 (×2): qty 1

## 2015-05-30 MED ORDER — HYDROCHLOROTHIAZIDE 25 MG PO TABS
25.0000 mg | ORAL_TABLET | Freq: Every day | ORAL | Status: DC
  Administered 2015-05-30 – 2015-05-31 (×2): 25 mg via ORAL
  Filled 2015-05-30 (×2): qty 1

## 2015-05-30 MED ORDER — SODIUM CHLORIDE 0.9% FLUSH: 3.0000 mL | INTRAVENOUS | Status: DC | PRN

## 2015-05-30 MED ORDER — ACETAMINOPHEN 325 MG PO TABS: 650.0000 mg | ORAL_TABLET | Freq: Four times a day (QID) | ORAL | Status: DC | PRN

## 2015-05-30 MED ORDER — ONDANSETRON HCL 4 MG/2ML IJ SOLN: 4.0000 mg | Freq: Four times a day (QID) | INTRAMUSCULAR | Status: DC | PRN

## 2015-05-30 NOTE — ED Notes (Signed)
Lab notified RN of hemolyzed specimen for BNP.  RN notified lab that pt is on way to floor and lab would need to stick for redraw.  1C floor RN, Aundra Millet, notified of this as well.

## 2015-05-30 NOTE — ED Notes (Signed)
Ems from home for shortness of breath. Pt with hx copd, asthma, chf on 4LNC at home. Pt o2 sats on arrival 88%. 96% now on 4L o2.

## 2015-05-30 NOTE — Discharge Instructions (Signed)
We have offered admission to the hospital but you would prefer to go home. This is certainly your choice but if you have any new or worrisome symptoms we stressed to you  the need to return to the emergency department. Follow up closely with your doctor. Do not feel to take your medications.

## 2015-05-30 NOTE — H&P (Signed)
[facility] - [place] at [facility]   PATIENT NAME: Julie Foley    MR#:  [id number]  DATE OF BIRTH:  [birth year]  DATE OF ADMISSION:  05/30/2015  PRIMARY CARE PHYSICIAN: Pcp Not In System   REQUESTING/REFERRING PHYSICIAN: Dr. Alphonzo Lemmings  CHIEF COMPLAINT:   Chief Complaint  Patient presents with  . Shortness of Breath    HISTORY OF PRESENT ILLNESS:  [name]  is a 60 y.o. female with a known history of COPD, chronic respiratory failure and 4 L of oxygen, hypertension, chronic diastolic CHF appearance to the emergency room with worsening shortness of breath.  Patient has had multiple presentations to the hospital and has left against medical advise on multiple occasions. Patient ran out of albuterol and was supposed to take a prescription today. She worsened to the point that she decided to come to the emergency room. Here patient was treated with multiple rounds of nebulizers and patient decided to leave AGAINST MEDICAL ADVICE. She tried to use the bedside commode and got extremely short of breath and desatted and later agreed to be admitted to the hospital. She has a dry cough. Chest tightness when she takes a deep breath. Her cough.  PAST MEDICAL HISTORY:   Past Medical History  Diagnosis Date  . COPD (chronic obstructive pulmonary disease) (HCC)   . CHF (congestive heart failure) (HCC)   . HTN (hypertension)   . GERD (gastroesophageal reflux disease)   . Depression   . Anxiety   . Chronic respiratory failure (HCC)     PAST SURGICAL HISTORY:   Past Surgical History  Procedure Laterality Date  . Tubal ligation Bilateral     SOCIAL HISTORY:   Social History  Substance Use Topics  . Smoking status: Former Smoker -- 2.00 packs/day for 35 years    Types: Cigarettes  . Smokeless tobacco: Not on file  . Alcohol Use: 0.0 oz/week    0 Standard drinks or equivalent per week    FAMILY HISTORY:   Family History  Problem Relation Age of Onset   . Cancer Mother   . Hypertension Mother   . Diabetes Mother   . Asthma Son     DRUG ALLERGIES:   Allergies  Allergen Reactions  . Symbicort [Budesonide-Formoterol Fumarate] Swelling and Other (See Comments)    Reaction:  Tongue swelling   . Formoterol Fumarate Swelling and Other (See Comments)    Reaction:  Tongue swelling    REVIEW OF SYSTEMS:   Review of Systems  Constitutional: Positive for malaise/fatigue. Negative for fever, chills and weight loss.  HENT: Negative for hearing loss and nosebleeds.   Eyes: Negative for blurred vision, double vision and pain.  Respiratory: Positive for cough, shortness of breath and wheezing. Negative for hemoptysis and sputum production.   Cardiovascular: Positive for orthopnea (chronic). Negative for chest pain, palpitations and leg swelling.  Gastrointestinal: Negative for nausea, vomiting, abdominal pain, diarrhea and constipation.  Genitourinary: Negative for dysuria and hematuria.  Musculoskeletal: Negative for myalgias, back pain and falls.  Skin: Negative for rash.  Neurological: Positive for weakness. Negative for dizziness, tremors, sensory change, speech change, focal weakness, seizures and headaches.  Endo/Heme/Allergies: Does not bruise/bleed easily.  Psychiatric/Behavioral: Negative for depression and memory loss. The patient is nervous/anxious.     MEDICATIONS AT HOME:   Prior to Admission medications   Medication Sig Start Date End Date Taking? Authorizing Provider  albuterol (PROVENTIL HFA;VENTOLIN HFA) 108 (90 Base) MCG/ACT inhaler Inhale 2 puffs into the lungs  every 4 (four) hours as needed for wheezing or shortness of breath.   Yes Historical Provider, MD  amLODipine (NORVASC) 5 MG tablet Take 5 mg by mouth daily.   Yes Historical Provider, MD  beclomethasone (QVAR) 80 MCG/ACT inhaler Inhale 1 puff into the lungs 2 (two) times daily.   Yes Historical Provider, MD  carvedilol (COREG) 12.5 MG tablet Take 1 tablet (12.5 mg  total) by mouth 2 (two) times daily with a meal. 08/13/14  Yes Sital Mody, MD  enalapril (VASOTEC) 20 MG tablet Take 20 mg by mouth 2 (two) times daily.    Yes Historical Provider, MD  fluticasone (FLONASE) 50 MCG/ACT nasal spray Place 1 spray into both nostrils 2 (two) times daily.    Yes Historical Provider, MD  Fluticasone-Salmeterol (ADVAIR) 500-50 MCG/DOSE AEPB Inhale 1 puff into the lungs 2 (two) times daily.   Yes Historical Provider, MD  hydrochlorothiazide (HYDRODIURIL) 25 MG tablet Take 25 mg by mouth daily.   Yes Historical Provider, MD  ipratropium-albuterol (DUONEB) 0.5-2.5 (3) MG/3ML SOLN Take 3 mLs by nebulization every 6 (six) hours as needed (for wheezing/shortness of breath).   Yes Historical Provider, MD  loratadine (CLARITIN) 10 MG tablet Take 10 mg by mouth daily.   Yes Historical Provider, MD  LORazepam (ATIVAN) 0.5 MG tablet Take 2 tablets (1 mg total) by mouth every 8 (eight) hours as needed for anxiety. 04/09/15  Yes Irean Hong, MD  montelukast (SINGULAIR) 10 MG tablet Take 10 mg by mouth at bedtime.    Yes Historical Provider, MD  nitroGLYCERIN (NITROSTAT) 0.4 MG SL tablet Place 0.4 mg under the tongue every 5 (five) minutes as needed for chest pain.   Yes Historical Provider, MD  roflumilast (DALIRESP) 500 MCG TABS tablet Take 500 mcg by mouth daily.   Yes Historical Provider, MD  tiotropium (SPIRIVA) 18 MCG inhalation capsule Place 18 mcg into inhaler and inhale daily.   Yes Historical Provider, MD  albuterol (PROVENTIL) (2.5 MG/3ML) 0.083% nebulizer solution Take 3 mLs (2.5 mg total) by nebulization every 4 (four) hours as needed for wheezing or shortness of breath. 05/30/15   Jeanmarie Plant, MD  predniSONE (DELTASONE) 20 MG tablet Take 3 tablets (60 mg total) by mouth daily. 05/30/15   Jeanmarie Plant, MD     VITAL SIGNS:  Blood pressure 112/68, pulse 100, temperature 98.8 F (37.1 C), temperature source Oral, resp. rate 16, height  (1.575 m), weight 72.576 kg (160  lb), SpO2 100 %.  PHYSICAL EXAMINATION:  Physical Exam  GENERAL:  60 y.o.-year-old patient lying in the bed with significant respiratory distress. Obese EYES: Pupils equal, round, reactive to light and accommodation. No scleral icterus. Extraocular muscles intact.  HEENT: Head atraumatic, normocephalic. Oropharynx and nasopharynx clear. No oropharyngeal erythema, moist oral mucosa  NECK:  Supple, no jugular venous distention. No thyroid enlargement, no tenderness.  LUNGS: Decreased breath sounds bilaterally with faint expiratory wheezing. One word conversational dyspnea. CARDIOVASCULAR: S1, S2 normal. No murmurs, rubs, or gallops.  ABDOMEN: Soft, nontender, nondistended. Bowel sounds present. No organomegaly or mass.  EXTREMITIES: No pedal edema, cyanosis, or clubbing. + 2 pedal & radial pulses b/l.   NEUROLOGIC: Cranial nerves II through XII are intact. No focal Motor or sensory deficits appreciated b/l PSYCHIATRIC: The patient is alert and oriented x 3. Anxious SKIN: No obvious rash, lesion, or ulcer.   LABORATORY PANEL:   CBC  Recent Labs Lab 05/27/15 0416  WBC 8.7  HGB 12.2  HCT 36.6  PLT 329   ------------------------------------------------------------------------------------------------------------------  Chemistries   Recent Labs Lab 05/26/15 1837 05/27/15 0416  NA 139 142  K 4.6 4.9  CL 103 105  CO2 30 31  GLUCOSE 134* 127*  BUN 14 14  CREATININE 0.83 0.81  CALCIUM 8.7* 8.9  AST 21  --   ALT 22  --   ALKPHOS 68  --   BILITOT 0.3  --    ------------------------------------------------------------------------------------------------------------------  Cardiac Enzymes  Recent Labs Lab 05/27/15 0416  TROPONINI <0.03   ------------------------------------------------------------------------------------------------------------------  RADIOLOGY:  Dg Chest 2 View  05/30/2015  CLINICAL DATA:  Shortness of Breath EXAM: CHEST  2 VIEW COMPARISON:  May 26, 2015 FINDINGS: There is slight atelectasis in the left base. The lungs elsewhere are clear. Heart size and pulmonary vascularity are normal. No adenopathy. There is degenerative change in each shoulder. IMPRESSION: Slight atelectasis in the left base.  Lungs elsewhere clear. Electronically Signed   By: Bretta Bang III M.D.   On: 05/30/2015 14:13     IMPRESSION AND PLAN:   * Acute on chronic respiratory failure due to COPD exacerbation  -IV steroids, Antibiotics - Scheduled Nebulizers - Inhalers -Wean O2 as tolerated - Consult pulmonary if no improvement  * Chronic diastolic CHF is stable Continue home medications. No signs of fluid overload.  * Hypertension Continue home medications   * DVT prophylaxis with Lovenox   All the records are reviewed and case discussed with ED provider. Management plans discussed with the patient, family and they are in agreement.  CODE STATUS: FULL  TOTAL TIME TAKING CARE OF THIS PATIENT: 40 minutes.   Milagros Loll R M.D on 05/30/2015 at 3:43 PM  Between 7am to 6pm - Pager - [phone]  After 6pm go to [email or web address] - password [email or web address]  [facility]  Office  [phone]  CC: Primary care physician; Pcp Not In System  Note: This dictation was prepared with Dragon dictation along with smaller phrase technology. Any transcriptional errors that result from this process are unintentional.

## 2015-05-30 NOTE — ED Provider Notes (Addendum)
[facility] Emergency Department Provider Note  ____________________________________________   I have reviewed the triage vital signs and the nursing notes.   HISTORY  Chief Complaint Shortness of Breath    HPI Julie Foley is a 60 y.o. female with a history of COPD on 4 L home oxygen states she ran out of her albuterol and became short of breath. Denies fever denies chills denies chest pain. Denies nausea or vomiting. States that the shortness of breath really started this morning. She is feeling somewhat better after DuoNeb from EMS. She would prefer not to have an IV or any further intervention if possible. She feels that if she has albuterol she will get better. She routinely denies a refuses admission and this time she refuses mission even before we get started.She has had no productive cough. Albuterol seems to make the symptoms better nothing seems to make it worse. She initially told EMS that she did not wish to be transported however, they did prevail upon her to agree to come.  Past Medical History  Diagnosis Date  . COPD (chronic obstructive pulmonary disease) (HCC)   . CHF (congestive heart failure) (HCC)   . HTN (hypertension)   . GERD (gastroesophageal reflux disease)   . Depression   . Anxiety     Patient Active Problem List   Diagnosis Date Noted  . Acute respiratory failure (HCC) 05/26/2015  . Sepsis (HCC) 02/16/2015  . COPD with acute exacerbation (HCC) 01/25/2015  . Acute respiratory failure with hypoxemia (HCC) 10/07/2014  . Anxiety 10/07/2014  . HTN (hypertension) 09/23/2014  . GERD (gastroesophageal reflux disease) 09/23/2014  . CHF (congestive heart failure) (HCC) 09/23/2014  . COPD exacerbation (HCC) 08/11/2014    Past Surgical History  Procedure Laterality Date  . Tubal ligation Bilateral     Current Outpatient Rx  Name  Route  Sig  Dispense  Refill  . albuterol (PROVENTIL HFA;VENTOLIN HFA) 108 (90 BASE) MCG/ACT  inhaler   Inhalation   Inhale 2 puffs into the lungs every 4 (four) hours as needed for wheezing or shortness of breath.         Marland Kitchen albuterol (PROVENTIL) (2.5 MG/3ML) 0.083% nebulizer solution   Nebulization   Take 3 mLs (2.5 mg total) by nebulization every 4 (four) hours as needed for wheezing or shortness of breath.   75 mL   2   . amLODipine (NORVASC) 5 MG tablet   Oral   Take 5 mg by mouth daily.         . beclomethasone (QVAR) 80 MCG/ACT inhaler   Inhalation   Inhale 1 puff into the lungs 2 (two) times daily.         . carvedilol (COREG) 12.5 MG tablet   Oral   Take 1 tablet (12.5 mg total) by mouth 2 (two) times daily with a meal.   30 tablet   0   . enalapril (VASOTEC) 20 MG tablet   Oral   Take 20 mg by mouth 2 (two) times daily.          . fluticasone (FLONASE) 50 MCG/ACT nasal spray   Each Nare   Place 1 spray into both nostrils 2 (two) times daily.          . Fluticasone-Salmeterol (ADVAIR) 500-50 MCG/DOSE AEPB   Inhalation   Inhale 1 puff into the lungs 2 (two) times daily.         . hydrochlorothiazide (HYDRODIURIL) 25 MG tablet   Oral  Take 25 mg by mouth daily.         Marland Kitchen ipratropium-albuterol (DUONEB) 0.5-2.5 (3) MG/3ML SOLN   Nebulization   Take 3 mLs by nebulization every 6 (six) hours as needed.   36 mL   0   . loratadine (CLARITIN) 10 MG tablet   Oral   Take 10 mg by mouth daily.         Marland Kitchen LORazepam (ATIVAN) 0.5 MG tablet   Oral   Take 2 tablets (1 mg total) by mouth every 8 (eight) hours as needed for anxiety.   15 tablet   0   . montelukast (SINGULAIR) 10 MG tablet   Oral   Take 10 mg by mouth every evening.         . nitroGLYCERIN (NITROSTAT) 0.4 MG SL tablet   Sublingual   Place 0.4 mg under the tongue every 5 (five) minutes as needed for chest pain.         . OXYGEN   Inhalation   Inhale 4 L/min into the lungs continuous.         . roflumilast (DALIRESP) 500 MCG TABS tablet   Oral   Take 500 mcg by  mouth daily.         Marland Kitchen tiotropium (SPIRIVA) 18 MCG inhalation capsule   Inhalation   Place 18 mcg into inhaler and inhale daily.           Allergies Symbicort and Formoterol fumarate  Family History  Problem Relation Age of Onset  . Cancer Mother   . Hypertension Mother   . Diabetes Mother   . Asthma Son     Social History Social History  Substance Use Topics  . Smoking status: Former Smoker -- 2.00 packs/day for 35 years    Types: Cigarettes  . Smokeless tobacco: None  . Alcohol Use: Yes    Review of Systems Constitutional: No fever/chills Eyes: No visual changes. ENT: No sore throat. No stiff neck no neck pain Cardiovascular: Denies chest pain. Respiratory: Positive shortness of breath. Gastrointestinal:   no vomiting.  No diarrhea.  No constipation. Genitourinary: Negative for dysuria. Musculoskeletal: Negative lower extremity swelling Skin: Negative for rash. Neurological: Negative for headaches, focal weakness or numbness. 10-point ROS otherwise negative.  ____________________________________________   PHYSICAL EXAM:  VITAL SIGNS: ED Triage Vitals  Enc Vitals Group     BP 05/30/15 1237 100/70 mmHg     Pulse Rate 05/30/15 1237 115     Resp 05/30/15 1237 18     Temp 05/30/15 1237 98.8 F (37.1 C)     Temp Source 05/30/15 1237 Oral     SpO2 05/30/15 1237 97 %     Weight 05/30/15 1237 160 lb (72.576 kg)     Height 05/30/15 1237 5\' 2"  (1.575 m)     Head Cir --      Peak Flow --      Pain Score --      Pain Loc --      Pain Edu? --      Excl. in GC? --     Constitutional: Alert and oriented. Breathing somewhat quickly but speaking in full sentences Eyes: Conjunctivae are normal. PERRL. EOMI. Head: Atraumatic. Nose: No congestion/rhinnorhea. Mouth/Throat: Mucous membranes are moist.  Oropharynx non-erythematous. Neck: No stridor.   Nontender with no meningismus Cardiovascular: Normal rate, regular rhythm. Grossly normal heart sounds.  Good  peripheral circulation. Respiratory: Mild tachypnea noted, diffuse mild wheezes no rales or rhonchi. Speaks in full  sentences. Abdominal: Soft and nontender. No distention. No guarding no rebound Back:  There is no focal tenderness or step off there is no midline tenderness there are no lesions noted. there is no CVA tenderness Musculoskeletal: No lower extremity tenderness. No joint effusions, no DVT signs strong distal pulses no edema Neurologic:  Normal speech and language. No gross focal neurologic deficits are appreciated.  Skin:  Skin is warm, dry and intact. No rash noted. Psychiatric: Mood and affect are normal. Speech and behavior are normal.  ____________________________________________   LABS (all labs ordered are listed, but only abnormal results are displayed)  Labs Reviewed - No data to display ____________________________________________  EKG  I personally interpreted any EKGs ordered by me or triage  ____________________________________________  RADIOLOGY  I reviewed any imaging ordered by me or triage that were performed during my shift ____________________________________________   PROCEDURES  Procedure(s) performed: None  Critical Care performed: None  ____________________________________________   INITIAL IMPRESSION / ASSESSMENT AND PLAN / ED COURSE  Pertinent labs & imaging results that were available during my care of the patient were reviewed by me and considered in my medical decision making (see chart for details).  Patient with COPD presents with wheeze and shortness of breath in the context of noncompliance.. At this time, patient would prefer not to have blood drawn and she certainly does not wish to be admitted. We'll do a chest x-ray is a precaution, give her albuterol, and prednisone and reassess. Patient has had multiple different visits for the same.  ----------------------------------------- 1:48 PM on  05/30/2015 -----------------------------------------  Patient states she feels much better. She has a slight wheeze but is moving air very well at this time comparatively. She is more relaxed. No increased work of breathing. We will see what her chest x-ray shows an reassess.  ----------------------------------------- 2:57 PM on 05/30/2015 -----------------------------------------  Lungs are clear, patient declines further evaluation or admission. I did explain to her that this would be against my medical advice to leave without admission. She states she felt better.  ----------------------------------------- 3:25 PM on 05/30/2015 -----------------------------------------  Patient got up to go to the bathroom became very winded and sat down. She states she does not feel safe for discharge. She does agree to an IV at this time. We will admit her to the hospital. ____________________________________________   FINAL CLINICAL IMPRESSION(S) / ED DIAGNOSES  Final diagnoses:  None      This chart was dictated using voice recognition software.  Despite best efforts to proofread,  errors can occur which can change meaning.     Jeanmarie Plant, MD 05/30/15 1305  Jeanmarie Plant, MD 05/30/15 1348  Jeanmarie Plant, MD 05/30/15 1457  Jeanmarie Plant, MD 05/30/15 [id number]

## 2015-05-31 LAB — BASIC METABOLIC PANEL
Anion gap: 8 (ref 5–15)
BUN: 17 mg/dL (ref 6–20)
CHLORIDE: 97 mmol/L — AB (ref 101–111)
CO2: 34 mmol/L — AB (ref 22–32)
Calcium: 9.4 mg/dL (ref 8.9–10.3)
Creatinine, Ser: 0.78 mg/dL (ref 0.44–1.00)
GFR calc Af Amer: >60 mL/min (ref >60)
GFR calc non Af Amer: >60 mL/min (ref >60)
GLUCOSE: 147 mg/dL — AB (ref 65–99)
POTASSIUM: 4 mmol/L (ref 3.5–5.1)
Sodium: 139 mmol/L (ref 135–145)

## 2015-05-31 LAB — CBC
HEMATOCRIT: 38.5 % (ref 35.0–47.0)
Hemoglobin: 12.8 g/dL (ref 12.0–16.0)
MCH: 31.4 pg (ref 26.0–34.0)
MCHC: 33.2 g/dL (ref 32.0–36.0)
MCV: 94.5 fL (ref 80.0–100.0)
Platelets: 351 10*3/uL (ref 150–440)
RBC: 4.07 MIL/uL (ref 3.80–5.20)
RDW: 12.8 % (ref 11.5–14.5)
WBC: 6.8 10*3/uL (ref 3.6–11.0)

## 2015-05-31 MED ORDER — PREDNISONE 10 MG PO TABS: ORAL_TABLET | ORAL | Status: DC

## 2015-05-31 MED ORDER — LORAZEPAM 0.5 MG PO TABS: 1.0000 mg | ORAL_TABLET | Freq: Three times a day (TID) | ORAL | Status: DC | PRN

## 2015-05-31 MED ORDER — AZITHROMYCIN 250 MG PO TABS: 250.0000 mg | ORAL_TABLET | Freq: Every day | ORAL | Status: DC

## 2015-06-01 NOTE — Discharge Summary (Signed)
[facility] [place] at [facility]   PATIENT NAME: Julie Foley    MR#:  [id number]  DATE OF BIRTH:  07-30-1955  DATE OF ADMISSION:  05/30/2015 ADMITTING PHYSICIAN: Milagros LollSrikar Sudini, MD  DATE OF DISCHARGE: 05/31/2015 11:17 AM  PRIMARY CARE PHYSICIAN: Pcp Not In System    ADMISSION DIAGNOSIS:  Chronic obstructive pulmonary disease with acute exacerbation (HCC) [J44.1]  DISCHARGE DIAGNOSIS:  Active Problems:   COPD exacerbation (HCC)   SECONDARY DIAGNOSIS:   Past Medical History  Diagnosis Date  . COPD (chronic obstructive pulmonary disease) (HCC)   . CHF (congestive heart failure) (HCC)   . HTN (hypertension)   . GERD (gastroesophageal reflux disease)   . Depression   . Anxiety   . Chronic respiratory failure [facility]HCC)     HOSPITAL COURSE:   60 year old female with past medical history of COPD, chronic respiratory failure, anxiety/depression, GERD, hypertension, CHF, medical noncompliance who presented to the hospital at shortness of breath and noted to be in COPD exacerbation.  #1 COPD exacerbation-patient was treated with IV steroids, DuoNeb nebs around-the-clock, started on oral Zithromax. -After getting aggressive treatment patient has improved and now being discharged on oral prednisone taper and Zithromax. -She will continue her maintenance meds as mentioned below.   #2 anxiety-patient will continue some Ativan as needed.  #3 essential hypertension-patient will continue her enalapril, Coreg, Norvasc.  #4 history of diastolic CHF-patient was not in congestive heart failure on the hospital. -She will resume her maintenance meds as mentioned below.  DISCHARGE CONDITIONS:   Stable  CONSULTS OBTAINED:     DRUG ALLERGIES:   Allergies  Allergen Reactions  . Symbicort [Budesonide-Formoterol Fumarate] Swelling and Other (See Comments)    Reaction:  Tongue swelling   . Formoterol Fumarate Swelling and Other (See Comments)    Reaction:  Tongue  swelling    DISCHARGE MEDICATIONS:   Discharge Medication List as of 05/31/2015 10:47 AM    START taking these medications   Details  azithromycin (ZITHROMAX) 250 MG tablet Take 1 tablet (250 mg total) by mouth daily., Starting 05/31/2015, Until Discontinued, Print    predniSONE (DELTASONE) 10 MG tablet Label  & dispense according to the schedule below. 5 Pills PO for 2 days then, 4 Pills PO for 2 days, 3 Pills PO for 2 days, 2 Pills PO for 2 days, 1 Pill PO for 2 days then STOP., Print      CONTINUE these medications which have CHANGED   Details  albuterol (PROVENTIL) (2.5 MG/3ML) 0.083% nebulizer solution Take 3 mLs (2.5 mg total) by nebulization every 4 (four) hours as needed for wheezing or shortness of breath., Starting 05/30/2015, Until Discontinued, Print    LORazepam (ATIVAN) 0.5 MG tablet Take 2 tablets (1 mg total) by mouth every 8 (eight) hours as needed for anxiety., Starting 05/31/2015, Until Discontinued, Print      CONTINUE these medications which have NOT CHANGED   Details  albuterol (PROVENTIL HFA;VENTOLIN HFA) 108 (90 Base) MCG/ACT inhaler Inhale 2 puffs into the lungs every 4 (four) hours as needed for wheezing or shortness of breath., Until Discontinued, Historical Med    amLODipine (NORVASC) 5 MG tablet Take 5 mg by mouth daily., Until Discontinued, Historical Med    beclomethasone (QVAR) 80 MCG/ACT inhaler Inhale 1 puff into the lungs 2 (two) times daily., Until Discontinued, Historical Med    carvedilol (COREG) 12.5 MG tablet Take 1 tablet (12.5 mg total) by mouth 2 (two) times daily with a meal., Starting  08/13/2014, Until Discontinued, Normal    enalapril (VASOTEC) 20 MG tablet Take 20 mg by mouth 2 (two) times daily. , Until Discontinued, Historical Med    fluticasone (FLONASE) 50 MCG/ACT nasal spray Place 1 spray into both nostrils 2 (two) times daily. , Until Discontinued, Historical Med    Fluticasone-Salmeterol (ADVAIR) 500-50 MCG/DOSE AEPB Inhale 1 puff into  the lungs 2 (two) times daily., Until Discontinued, Historical Med    hydrochlorothiazide (HYDRODIURIL) 25 MG tablet Take 25 mg by mouth daily., Until Discontinued, Historical Med    ipratropium-albuterol (DUONEB) 0.5-2.5 (3) MG/3ML SOLN Take 3 mLs by nebulization every 6 (six) hours as needed (for wheezing/shortness of breath)., Until Discontinued, Historical Med    loratadine (CLARITIN) 10 MG tablet Take 10 mg by mouth daily., Until Discontinued, Historical Med    montelukast (SINGULAIR) 10 MG tablet Take 10 mg by mouth at bedtime. , Until Discontinued, Historical Med    nitroGLYCERIN (NITROSTAT) 0.4 MG SL tablet Place 0.4 mg under the tongue every 5 (five) minutes as needed for chest pain., Until Discontinued, Historical Med    roflumilast (DALIRESP) 500 MCG TABS tablet Take 500 mcg by mouth daily., Until Discontinued, Historical Med    tiotropium (SPIRIVA) 18 MCG inhalation capsule Place 18 mcg into inhaler and inhale daily., Until Discontinued, Historical Med         DISCHARGE INSTRUCTIONS:   DIET:  Cardiac diet  DISCHARGE CONDITION:  Stable  ACTIVITY:  Activity as tolerated  OXYGEN:  Home Oxygen: No.   Oxygen Delivery: room air  DISCHARGE LOCATION:  home   If you experience worsening of your admission symptoms, develop shortness of breath, life threatening emergency, suicidal or homicidal thoughts you must seek medical attention immediately by calling 911 or calling your MD immediately  if symptoms less severe.  You Must read complete instructions/literature along with all the possible adverse reactions/side effects for all the Medicines you take and that have been prescribed to you. Take any new Medicines after you have completely understood and accpet all the possible adverse reactions/side effects.   Please note  You were cared for by a hospitalist during your hospital stay. If you have any questions about your discharge medications or the care you received while  you were in the hospital after you are discharged, you can call the unit and asked to speak with the hospitalist on call if the hospitalist that took care of you is not available. Once you are discharged, your primary care physician will handle any further medical issues. Please note that NO REFILLS for any discharge medications will be authorized once you are discharged, as it is imperative that you return to your primary care physician (or establish a relationship with a primary care physician if you do not have one) for your aftercare needs so that they can reassess your need for medications and monitor your lab values.     Today   Shortness of breath, wheezing much improved. Wants to go home  VITAL SIGNS:  Blood pressure 131/80, pulse 78, temperature 98.1 F (36.7 C), temperature source Oral, resp. rate 18, height  (1.575 m), weight 78.109 kg (172 lb 3.2 oz), SpO2 98 %.  I/O:  No intake or output data in the 24 hours ending 06/01/15 1722  PHYSICAL EXAMINATION:  GENERAL:  60 y.o.-year-old patient lying in the bed with no acute distress.  EYES: Pupils equal, round, reactive to light and accommodation. No scleral icterus. Extraocular muscles intact.  HEENT: Head atraumatic, normocephalic. Oropharynx  and nasopharynx clear.  NECK:  Supple, no jugular venous distention. No thyroid enlargement, no tenderness.  LUNGS: Normal breath sounds bilaterally, no wheezing, rales,rhonchi. No use of accessory muscles of respiration.  CARDIOVASCULAR: S1, S2 normal. No murmurs, rubs, or gallops.  ABDOMEN: Soft, non-tender, non-distended. Bowel sounds present. No organomegaly or mass.  EXTREMITIES: No pedal edema, cyanosis, or clubbing.  NEUROLOGIC: Cranial nerves II through XII are intact. No focal motor or sensory defecits b/l.  PSYCHIATRIC: The patient is alert and oriented x 3. Good affect.  SKIN: No obvious rash, lesion, or ulcer.   DATA REVIEW:   CBC  Recent Labs Lab 05/31/15 0440  WBC  6.8  HGB 12.8  HCT 38.5  PLT 351    Chemistries   Recent Labs Lab 05/30/15 1642 05/31/15 0440  NA 138 139  K 4.9 4.0  CL 99* 97*  CO2 35* 34*  GLUCOSE 136* 147*  BUN 14 17  CREATININE 0.79 0.78  CALCIUM 9.1 9.4  AST 19  --   ALT 24  --   ALKPHOS 62  --   BILITOT 0.5  --     Cardiac Enzymes  Recent Labs Lab 05/27/15 0416  TROPONINI <0.03    Microbiology Results  Results for orders placed or performed during the hospital encounter of 05/26/15  MRSA PCR Screening     Status: None   Collection Time: 05/27/15 12:43 AM  Result Value Ref Range Status   MRSA by PCR NEGATIVE NEGATIVE Final    Comment:        The GeneXpert MRSA Assay (FDA approved for NASAL specimens only), is one component of a comprehensive MRSA colonization surveillance program. It is not intended to diagnose MRSA infection nor to guide or monitor treatment for MRSA infections.     RADIOLOGY:  No results found.    Management plans discussed with the patient, family and they are in agreement.  CODE STATUS:  Code Status History    Date Active Date Inactive Code Status Order ID Comments User Context   05/30/2015  3:47 PM 05/31/2015  2:18 PM Full Code [id number]  Milagros Loll, MD ED   05/26/2015  8:18 PM 05/27/2015 12:18 PM Full Code [id number]  Alford Highland, MD ED   02/16/2015  4:54 AM 02/17/2015  1:51 PM Full Code [id number]  Arnaldo Natal, MD Inpatient   01/25/2015  7:51 AM 01/25/2015  8:38 PM Full Code [id number]  Oralia Manis, MD Inpatient   01/02/2015 11:42 PM 01/03/2015  6:47 PM Full Code [id number]  Ramonita Lab, MD Inpatient   12/10/2014  9:32 PM 12/11/2014  1:12 PM Full Code [id number]  Auburn Bilberry, MD Inpatient   10/07/2014  3:54 AM 10/09/2014  1:35 PM Full Code [id number]  Oralia Manis, MD Inpatient   09/23/2014  3:18 AM 09/23/2014  1:57 PM Full Code [id number]  Oralia Manis, MD Inpatient   08/11/2014  6:08 PM 08/13/2014  3:53 PM Full Code [id number]  Adrian Saran, MD Inpatient      TOTAL  TIME TAKING CARE OF THIS PATIENT: 40 minutes.    Houston Siren M.D on 06/01/2015 at 5:22 PM  Between 7am to 6pm - Pager - [phone]  After 6pm go to [email or web address] - password [email or web address]  [facility]  Office  [phone]  CC: Primary care physician; Pcp Not In System

## 2015-07-12 ENCOUNTER — Emergency Department: Payer: Medicaid Other

## 2015-07-12 ENCOUNTER — Inpatient Hospital Stay
Admission: EM | Admit: 2015-07-12 | Discharge: 2015-07-13 | DRG: 190 | Disposition: A | Discharge status: Home / Self Care | Payer: Medicaid Other | Attending: Pulmonary Disease | Admitting: Pulmonary Disease

## 2015-07-12 ENCOUNTER — Encounter: Payer: Self-pay | Admitting: Emergency Medicine

## 2015-07-12 DIAGNOSIS — Z87891 Personal history of nicotine dependence: Secondary | ICD-10-CM

## 2015-07-12 DIAGNOSIS — J9602 Acute respiratory failure with hypercapnia: Secondary | ICD-10-CM

## 2015-07-12 DIAGNOSIS — Z888 Allergy status to other drugs, medicaments and biological substances status: Secondary | ICD-10-CM

## 2015-07-12 DIAGNOSIS — J9621 Acute and chronic respiratory failure with hypoxia: Secondary | ICD-10-CM | POA: Diagnosis present

## 2015-07-12 DIAGNOSIS — F329 Major depressive disorder, single episode, unspecified: Secondary | ICD-10-CM | POA: Diagnosis present

## 2015-07-12 DIAGNOSIS — Z9119 Patient's noncompliance with other medical treatment and regimen: Secondary | ICD-10-CM

## 2015-07-12 DIAGNOSIS — J45909 Unspecified asthma, uncomplicated: Secondary | ICD-10-CM | POA: Diagnosis present

## 2015-07-12 DIAGNOSIS — K219 Gastro-esophageal reflux disease without esophagitis: Secondary | ICD-10-CM | POA: Diagnosis present

## 2015-07-12 DIAGNOSIS — J9601 Acute respiratory failure with hypoxia: Secondary | ICD-10-CM

## 2015-07-12 DIAGNOSIS — I159 Secondary hypertension, unspecified: Secondary | ICD-10-CM

## 2015-07-12 DIAGNOSIS — J9620 Acute and chronic respiratory failure, unspecified whether with hypoxia or hypercapnia: Secondary | ICD-10-CM | POA: Diagnosis not present

## 2015-07-12 DIAGNOSIS — I110 Hypertensive heart disease with heart failure: Secondary | ICD-10-CM | POA: Diagnosis present

## 2015-07-12 DIAGNOSIS — Z7951 Long term (current) use of inhaled steroids: Secondary | ICD-10-CM | POA: Diagnosis not present

## 2015-07-12 DIAGNOSIS — Z79899 Other long term (current) drug therapy: Secondary | ICD-10-CM | POA: Diagnosis not present

## 2015-07-12 DIAGNOSIS — I509 Heart failure, unspecified: Secondary | ICD-10-CM | POA: Diagnosis present

## 2015-07-12 DIAGNOSIS — R000 Tachycardia, unspecified: Secondary | ICD-10-CM | POA: Diagnosis not present

## 2015-07-12 DIAGNOSIS — J9622 Acute and chronic respiratory failure with hypercapnia: Secondary | ICD-10-CM | POA: Diagnosis not present

## 2015-07-12 DIAGNOSIS — J441 Chronic obstructive pulmonary disease with (acute) exacerbation: Secondary | ICD-10-CM | POA: Diagnosis present

## 2015-07-12 DIAGNOSIS — F419 Anxiety disorder, unspecified: Secondary | ICD-10-CM | POA: Diagnosis present

## 2015-07-12 HISTORY — DX: Unspecified asthma, uncomplicated: J45.909

## 2015-07-12 LAB — BASIC METABOLIC PANEL
Anion gap: 4 — ABNORMAL LOW (ref 5–15)
BUN: 11 mg/dL (ref 6–20)
CO2: 32 mmol/L (ref 22–32)
CREATININE: 0.74 mg/dL (ref 0.44–1.00)
Calcium: 9.5 mg/dL (ref 8.9–10.3)
Chloride: 105 mmol/L (ref 101–111)
GFR calc Af Amer: >60 mL/min (ref >60)
GLUCOSE: 116 mg/dL — AB (ref 65–99)
Potassium: 4.1 mmol/L (ref 3.5–5.1)
SODIUM: 141 mmol/L (ref 135–145)

## 2015-07-12 LAB — CBC
HCT: 38.8 % (ref 35.0–47.0)
Hemoglobin: 13 g/dL (ref 12.0–16.0)
MCH: 32.2 pg (ref 26.0–34.0)
MCHC: 33.5 g/dL (ref 32.0–36.0)
MCV: 96.4 fL (ref 80.0–100.0)
PLATELETS: 313 10*3/uL (ref 150–440)
RBC: 4.02 MIL/uL (ref 3.80–5.20)
RDW: 13.8 % (ref 11.5–14.5)
WBC: 7.6 10*3/uL (ref 3.6–11.0)

## 2015-07-12 LAB — MRSA PCR SCREENING: MRSA BY PCR: NEGATIVE

## 2015-07-12 LAB — GLUCOSE, CAPILLARY: Glucose-Capillary: 125 mg/dL — ABNORMAL HIGH (ref 65–99)

## 2015-07-12 LAB — TROPONIN I: Troponin I: <0.03 ng/mL (ref <0.031)

## 2015-07-12 MED ORDER — ALBUTEROL SULFATE (2.5 MG/3ML) 0.083% IN NEBU
2.5000 mg | INHALATION_SOLUTION | Freq: Once | RESPIRATORY_TRACT | Status: AC
  Administered 2015-07-12: 2.5 mg via RESPIRATORY_TRACT

## 2015-07-12 MED ORDER — ENOXAPARIN SODIUM 40 MG/0.4ML ~~LOC~~ SOLN: 40.0000 mg | SUBCUTANEOUS | Status: DC

## 2015-07-12 MED ORDER — ALPRAZOLAM 0.25 MG PO TABS
0.2500 mg | ORAL_TABLET | ORAL | Status: DC | PRN
  Administered 2015-07-12 (×2): 0.25 mg via ORAL
  Filled 2015-07-12 (×3): qty 1

## 2015-07-12 MED ORDER — HYDRALAZINE HCL 20 MG/ML IJ SOLN: 10.0000 mg | INTRAMUSCULAR | Status: DC | PRN

## 2015-07-12 MED ORDER — ALPRAZOLAM 0.5 MG PO TABS
0.2500 mg | ORAL_TABLET | Freq: Once | ORAL | Status: AC
  Administered 2015-07-12: 0.25 mg via ORAL

## 2015-07-12 MED ORDER — BUDESONIDE 0.25 MG/2ML IN SUSP
0.2500 mg | Freq: Four times a day (QID) | RESPIRATORY_TRACT | Status: DC
  Administered 2015-07-12 – 2015-07-13 (×4): 0.25 mg via RESPIRATORY_TRACT
  Filled 2015-07-12 (×5): qty 2

## 2015-07-12 MED ORDER — CEFTRIAXONE SODIUM 1 G IJ SOLR
1.0000 g | INTRAMUSCULAR | Status: DC
  Administered 2015-07-12: 1 g via INTRAVENOUS
  Filled 2015-07-12 (×2): qty 10

## 2015-07-12 MED ORDER — PANTOPRAZOLE SODIUM 40 MG IV SOLR: 40.0000 mg | Freq: Every day | INTRAVENOUS | Status: DC

## 2015-07-12 MED ORDER — IPRATROPIUM BROMIDE 0.02 % IN SOLN
0.5000 mg | Freq: Four times a day (QID) | RESPIRATORY_TRACT | Status: DC
  Administered 2015-07-12 – 2015-07-13 (×4): 0.5 mg via RESPIRATORY_TRACT
  Filled 2015-07-12 (×5): qty 2.5

## 2015-07-12 MED ORDER — LEVALBUTEROL HCL 0.63 MG/3ML IN NEBU
0.6300 mg | INHALATION_SOLUTION | RESPIRATORY_TRACT | Status: DC | PRN
  Administered 2015-07-12 – 2015-07-13 (×2): 0.63 mg via RESPIRATORY_TRACT
  Filled 2015-07-12 (×2): qty 3

## 2015-07-12 MED ORDER — ALPRAZOLAM 0.5 MG PO TABS
ORAL_TABLET | ORAL | Status: AC
  Filled 2015-07-12: qty 1

## 2015-07-12 MED ORDER — METHYLPREDNISOLONE SODIUM SUCC 125 MG IJ SOLR
80.0000 mg | Freq: Three times a day (TID) | INTRAMUSCULAR | Status: DC
  Administered 2015-07-12 – 2015-07-13 (×3): 80 mg via INTRAVENOUS
  Filled 2015-07-12 (×3): qty 2

## 2015-07-12 MED ORDER — IOPAMIDOL (ISOVUE-370) INJECTION 76%
75.0000 mL | Freq: Once | INTRAVENOUS | Status: AC | PRN
  Administered 2015-07-12: 75 mL via INTRAVENOUS

## 2015-07-12 MED ORDER — LORAZEPAM 2 MG/ML IJ SOLN
1.0000 mg | Freq: Once | INTRAMUSCULAR | Status: AC
  Administered 2015-07-12: 1 mg via INTRAVENOUS

## 2015-07-12 MED ORDER — ALBUTEROL SULFATE (2.5 MG/3ML) 0.083% IN NEBU
INHALATION_SOLUTION | RESPIRATORY_TRACT | Status: AC
  Administered 2015-07-12: 2.5 mg via RESPIRATORY_TRACT
  Filled 2015-07-12: qty 6

## 2015-07-12 MED ORDER — METOPROLOL TARTRATE 1 MG/ML IV SOLN: 2.5000 mg | INTRAVENOUS | Status: DC | PRN

## 2015-07-12 MED ORDER — LEVALBUTEROL HCL 1.25 MG/0.5ML IN NEBU
1.2500 mg | INHALATION_SOLUTION | Freq: Four times a day (QID) | RESPIRATORY_TRACT | Status: DC
Start: 2015-07-12 — End: 2015-07-13
  Administered 2015-07-12 – 2015-07-13 (×4): 1.25 mg via RESPIRATORY_TRACT
  Filled 2015-07-12 (×4): qty 0.5

## 2015-07-12 MED ORDER — ACETAMINOPHEN 325 MG PO TABS
650.0000 mg | ORAL_TABLET | ORAL | Status: DC | PRN
Start: 2015-07-12 — End: 2015-07-13
  Administered 2015-07-12: 650 mg via ORAL
  Filled 2015-07-12: qty 2

## 2015-07-12 MED ORDER — LORAZEPAM 2 MG/ML IJ SOLN
INTRAMUSCULAR | Status: AC
  Administered 2015-07-12: 1 mg via INTRAVENOUS
  Filled 2015-07-12: qty 1

## 2015-07-12 MED ORDER — MONTELUKAST SODIUM 10 MG PO TABS
10.0000 mg | ORAL_TABLET | Freq: Every day | ORAL | Status: DC
  Administered 2015-07-12: 10 mg via ORAL
  Filled 2015-07-12: qty 1

## 2015-07-12 MED ORDER — AMLODIPINE BESYLATE 5 MG PO TABS
5.0000 mg | ORAL_TABLET | Freq: Every day | ORAL | Status: DC
  Administered 2015-07-12: 5 mg via ORAL
  Filled 2015-07-12: qty 1

## 2015-07-12 MED ORDER — HYDROCHLOROTHIAZIDE 25 MG PO TABS
25.0000 mg | ORAL_TABLET | Freq: Every day | ORAL | Status: DC
  Administered 2015-07-12: 25 mg via ORAL
  Filled 2015-07-12: qty 1

## 2015-07-12 MED ORDER — ONDANSETRON HCL 4 MG/2ML IJ SOLN: 4.0000 mg | Freq: Four times a day (QID) | INTRAMUSCULAR | Status: DC | PRN

## 2015-07-12 MED ORDER — POTASSIUM CHLORIDE 2 MEQ/ML IV SOLN
INTRAVENOUS | Status: DC
  Administered 2015-07-12: 13:00:00 via INTRAVENOUS
  Filled 2015-07-12 (×5): qty 1000

## 2015-07-12 MED ORDER — SODIUM CHLORIDE 0.9 % IV SOLN: 250.0000 mL | INTRAVENOUS | Status: DC | PRN

## 2015-07-12 MED ORDER — ROFLUMILAST 500 MCG PO TABS
500.0000 ug | ORAL_TABLET | Freq: Every day | ORAL | Status: DC
  Administered 2015-07-12: 500 ug via ORAL
  Filled 2015-07-12 (×2): qty 1

## 2015-07-12 NOTE — ED Provider Notes (Signed)
[facility] Emergency Department Provider Note  ____________________________________________  Time seen: 6:55 AM  I have reviewed the triage vital signs and the nursing notes.   HISTORY  Chief Complaint Respiratory Distress      HPI Julie Foley is a 60 y.o. female with history of COPD and congestive heart failure presents with acute onset of central nonradiating chest pain accompanied by dyspnea at 12:30 AM this morning. Patient states that she was in her normal state of health before going to bed last night. On EMS arrival patient noted to be in severe respiratory distress and a such CPAP was applied. Patient states that symptoms of since improved since the application of CPAP as well as nitroglycerin paste. Patient states that her current pain score is 2 out of 10.    Past Medical History  Diagnosis Date  . COPD (chronic obstructive pulmonary disease) (HCC)   . CHF (congestive heart failure) (HCC)   . HTN (hypertension)   . GERD (gastroesophageal reflux disease)   . Depression   . Anxiety   . Chronic respiratory failure [facility])     Patient Active Problem List   Diagnosis Date Noted  . Acute respiratory failure (HCC) 05/26/2015  . Sepsis (HCC) 02/16/2015  . COPD with acute exacerbation (HCC) 01/25/2015  . Acute respiratory failure with hypoxemia (HCC) 10/07/2014  . Anxiety 10/07/2014  . HTN (hypertension) 09/23/2014  . GERD (gastroesophageal reflux disease) 09/23/2014  . CHF (congestive heart failure) (HCC) 09/23/2014  . COPD exacerbation (HCC) 08/11/2014    Past Surgical History  Procedure Laterality Date  . Tubal ligation Bilateral     Current Outpatient Rx  Name  Route  Sig  Dispense  Refill  . albuterol (PROVENTIL HFA;VENTOLIN HFA) 108 (90 Base) MCG/ACT inhaler   Inhalation   Inhale 2 puffs into the lungs every 4 (four) hours as needed for wheezing or shortness of breath.         Marland Kitchen albuterol (PROVENTIL) (2.5 MG/3ML) 0.083%  nebulizer solution   Nebulization   Take 3 mLs (2.5 mg total) by nebulization every 4 (four) hours as needed for wheezing or shortness of breath.   75 mL   2   . amLODipine (NORVASC) 5 MG tablet   Oral   Take 5 mg by mouth daily.         Marland Kitchen azithromycin (ZITHROMAX) 250 MG tablet   Oral   Take 1 tablet (250 mg total) by mouth daily.   5 tablet   0   . beclomethasone (QVAR) 80 MCG/ACT inhaler   Inhalation   Inhale 1 puff into the lungs 2 (two) times daily.         . carvedilol (COREG) 12.5 MG tablet   Oral   Take 1 tablet (12.5 mg total) by mouth 2 (two) times daily with a meal.   30 tablet   0   . enalapril (VASOTEC) 20 MG tablet   Oral   Take 20 mg by mouth 2 (two) times daily.          . fluticasone (FLONASE) 50 MCG/ACT nasal spray   Each Nare   Place 1 spray into both nostrils 2 (two) times daily.          . Fluticasone-Salmeterol (ADVAIR) 500-50 MCG/DOSE AEPB   Inhalation   Inhale 1 puff into the lungs 2 (two) times daily.         . hydrochlorothiazide (HYDRODIURIL) 25 MG tablet   Oral   Take 25 mg  by mouth daily.         Marland Kitchen ipratropium-albuterol (DUONEB) 0.5-2.5 (3) MG/3ML SOLN   Nebulization   Take 3 mLs by nebulization every 6 (six) hours as needed (for wheezing/shortness of breath).         . loratadine (CLARITIN) 10 MG tablet   Oral   Take 10 mg by mouth daily.         Marland Kitchen LORazepam (ATIVAN) 0.5 MG tablet   Oral   Take 2 tablets (1 mg total) by mouth every 8 (eight) hours as needed for anxiety.   15 tablet   0   . montelukast (SINGULAIR) 10 MG tablet   Oral   Take 10 mg by mouth at bedtime.          . nitroGLYCERIN (NITROSTAT) 0.4 MG SL tablet   Sublingual   Place 0.4 mg under the tongue every 5 (five) minutes as needed for chest pain.         . predniSONE (DELTASONE) 10 MG tablet      Label  & dispense according to the schedule below. 5 Pills PO for 2 days then, 4 Pills PO for 2 days, 3 Pills PO for 2 days, 2 Pills PO for 2  days, 1 Pill PO for 2 days then STOP.   30 tablet   0   . roflumilast (DALIRESP) 500 MCG TABS tablet   Oral   Take 500 mcg by mouth daily.         Marland Kitchen tiotropium (SPIRIVA) 18 MCG inhalation capsule   Inhalation   Place 18 mcg into inhaler and inhale daily.           Allergies Symbicort and Formoterol fumarate  Family History  Problem Relation Age of Onset  . Cancer Mother   . Hypertension Mother   . Diabetes Mother   . Asthma Son     Social History Social History  Substance Use Topics  . Smoking status: Former Smoker -- 2.00 packs/day for 35 years    Types: Cigarettes  . Smokeless tobacco: Not on file  . Alcohol Use: 0.0 oz/week    0 Standard drinks or equivalent per week    Review of Systems  Constitutional: Negative for fever. Eyes: Negative for visual changes. ENT: Negative for sore throat. Cardiovascular: Positive for chest pain. Respiratory: Positive for shortness of breath. Gastrointestinal: Negative for abdominal pain, vomiting and diarrhea. Genitourinary: Negative for dysuria. Musculoskeletal: Negative for back pain. Skin: Negative for rash. Neurological: Negative for headaches, focal weakness or numbness.   10-point ROS otherwise negative.  ____________________________________________   PHYSICAL EXAM:  VITAL SIGNS: ED Triage Vitals  Enc Vitals Group     BP --      Pulse --      Resp --      Temp --      Temp src --      SpO2 --      Weight --      Height --      Head Cir --      Peak Flow --      Pain Score --      Pain Loc --      Pain Edu? --      Excl. in GC? --      Constitutional: Alert , Apparent respiratory distress Eyes: Conjunctivae are normal. PERRL. Normal extraocular movements. ENT   Head: Normocephalic and atraumatic.   Nose: No congestion/rhinnorhea.   Mouth/Throat: Mucous membranes are moist.  Neck: No stridor. Hematological/Lymphatic/Immunilogical: No cervical lymphadenopathy. Cardiovascular:  Tachycardia. Normal and symmetric distal pulses are present in all extremities. No murmurs, rubs, or gallops. Respiratory: Tachypnea hypoxia and accessory respiratory muscle use diffuse rhonchi. Wheezes Gastrointestinal: Soft and nontender. No distention. There is no CVA tenderness. Genitourinary: deferred Musculoskeletal: Nontender with normal range of motion in all extremities. No joint effusions.  No lower extremity tenderness nor edema. Neurologic:  Normal speech and language. No gross focal neurologic deficits are appreciated. Speech is normal.  Skin:  Skin is warm, dry and intact. No rash noted. Psychiatric: Mood and affect are normal. Speech and behavior are normal. Patient exhibits appropriate insight and judgment.  ____________________________________________    LABS (pertinent positives/negatives) Labs Reviewed  BASIC METABOLIC PANEL - Abnormal; Notable for the following:    Glucose, Bld 116 (*)    Anion gap 4 (*)    All other components within normal limits  GLUCOSE, CAPILLARY - Abnormal; Notable for the following:    Glucose-Capillary 125 (*)    All other components within normal limits  MRSA PCR SCREENING  CBC  TROPONIN I  CBC  BASIC METABOLIC PANEL  TROPONIN I     ____________________________________________   EKG ED ECG REPORT I, [place] Kristofer Schaffert, the attending physician, personally viewed and interpreted this ECG.   Date: 07/12/2015  EKG Time: 7:14 AM  Rate: 148  Rhythm: Sinus tachycardia  Axis: Normal  Intervals: Normal  ST&T Change: None    ____________________________________________    RADIOLOGY  DG Chest Port 1 View (Final result) Result time: 07/12/15 07:36:42   Final result by Rad Results In Interface (07/12/15 07:36:42)   Narrative:   CLINICAL DATA: Shortness of breath.  EXAM: PORTABLE CHEST 1 VIEW  COMPARISON: May 30, 2015.  FINDINGS: The heart size and mediastinal contours are within normal limits. Both lungs are clear.  No pneumothorax or pleural effusion is noted. The visualized skeletal structures are unremarkable.  IMPRESSION: No acute cardiopulmonary abnormality seen.   Electronically Signed By: Lupita RaiderJames Green Jr, M.D. On: 07/12/2015 07:36        _____________  Critical Care performed: CRITICAL CARE Performed by: Darci CurrentANDOLPH N Markeis Allman   Total critical care time: 30 minutes  Critical care time was exclusive of separately billable procedures and treating other patients.  Critical care was necessary to treat or prevent imminent or life-threatening deterioration.  Critical care was time spent personally by me on the following activities: development of treatment plan with patient and/or surrogate as well as nursing, discussions with consultants, evaluation of patient's response to treatment, examination of patient, obtaining history from patient or surrogate, ordering and performing treatments and interventions, ordering and review of laboratory studies, ordering and review of radiographic studies, pulse oximetry and re-evaluation of patient's condition.   ____________________________________________   INITIAL IMPRESSION / ASSESSMENT AND PLAN / ED COURSE  Pertinent labs & imaging results that were available during my care of the patient were reviewed by me and considered in my medical decision making (see chart for details).  BiPAP applied on presentation to the emergency department with nebulized albuterol 2 given.  ____________________________________________   FINAL CLINICAL IMPRESSION(S) / ED DIAGNOSES  Final diagnoses:  None  Acute respiratory failure Acute COPD exacerbation    Darci Currentandolph N Penda Venturi, MD 07/12/15 2237

## 2015-07-12 NOTE — ED Notes (Signed)
Patient transported to CT. Pt unable to tolerate CT at this time. Pt unable to lie flat. Pt hr 148. Pt RR 27. Dr. Scotty CourtStafford notified. Plan to attempt CT later.

## 2015-07-12 NOTE — Progress Notes (Signed)
CCM CONSULT NOTE - INITIAL   Pharmacy Consult for Antibiotic Renal Adjustments   Allergies  Allergen Reactions  . Symbicort [Budesonide-Formoterol Fumarate] Swelling and Other (See Comments)    Reaction:  Tongue swelling   . Formoterol Fumarate Swelling and Other (See Comments)    Reaction:  Tongue swelling    Patient Measurements:   BW from 3/1 admission: 72.6 kg, est CrCl~72 mL/min based on adjusted body weight of 59 kg  Vital Signs: BP: 114/76 mmHg (04/13 0953) Pulse Rate: 135 (04/13 0953) Intake/Output from previous day:   Intake/Output from this shift:    Labs:  Recent Labs  07/12/15 0705  WBC 7.6  HGB 13.0  HCT 38.8  PLT 313  CREATININE 0.74   CrCl cannot be calculated (Unknown ideal weight.).   Microbiology: No results found for this or any previous visit (from the past 720 hour(s)).  Medical History: Past Medical History  Diagnosis Date  . COPD (chronic obstructive pulmonary disease) (HCC)   . CHF (congestive heart failure) (HCC)   . HTN (hypertension)   . GERD (gastroesophageal reflux disease)   . Depression   . Anxiety   . Chronic respiratory failure (HCC)   . Asthma     Medications:  Scheduled:  . budesonide (PULMICORT) nebulizer solution  0.25 mg Nebulization 4 times per day  . enoxaparin (LOVENOX) injection  40 mg Subcutaneous Q24H  . ipratropium  0.5 mg Nebulization Q6H  . levalbuterol  1.25 mg Nebulization 4 times per day  . methylPREDNISolone (SOLU-MEDROL) injection  80 mg Intravenous 3 times per day  . pantoprazole (PROTONIX) IV  40 mg Intravenous QHS   Infusions:  . sodium chloride    . cefTRIAXone (ROCEPHIN)  IV    . lactated ringers with kcl      Assessment: Pharmacy consulted to adjust antibiotics for renal function.  Patient currently ordered Ceftriaxone 1 gm IV q24h.   Est CrCl~72 mL/min  Plan:  No renal adjustment to antibiotic therapy warranted at this time.   Deanza Upperman G 07/12/2015,10:07 AM

## 2015-07-12 NOTE — ED Notes (Signed)
MD at bedside. Bipap temporarily removed per Dr. Darrol AngelSimons. Heather, RT notified.

## 2015-07-12 NOTE — ED Notes (Addendum)
Pt arrived via EMS from home c/o SOB, arrives with CPAP in place. Reports that SOB started around 0030 this morning, took 3 SL nitro with the last one taken at 0030. Ran out of her albuterol at home. Was initially rating chest pain 6/10, after CPAP started, chest pain down to 3/10. EMS gave pt 1 inch of Nitro paste to right chest which brought chest pain down to 2/10. EMS also gave pt 2 duonebs, one albuterol breathing treatment, and 125mg  solumedrol. Pt has wheezing in all lobes. Nitro paste removed from chest after arrival to ED due to pt's blood pressure.

## 2015-07-12 NOTE — H&P (Signed)
PCCM ADMISSION NOTE  HPI: 59 F with severe COPD and airway hyerreactivity followed by Dr Tula Nakayama of [facility] admitted via ED with acute on chronic respiratory failure and severe bronchospasm. At the time of my initial evaluation, she was on BiPAP and quite severely dyspneic when it was removed. Therefore, history was limited. She reports that she had run out of albuterol which she uses in a nebulizer frequently, even @ baseline. She denies CP, fever, purulent sputum, hemoptysis, LE edema and calf tenderness    Past Medical History  Diagnosis Date  . COPD (chronic obstructive pulmonary disease) (HCC)   . CHF (congestive heart failure) (HCC)   . HTN (hypertension)   . GERD (gastroesophageal reflux disease)   . Depression   . Anxiety   . Chronic respiratory failure (HCC)   . Asthma     Past Surgical History  Procedure Laterality Date  . Tubal ligation Bilateral     MEDICATIONS:  No current facility-administered medications on file prior to encounter.   Current Outpatient Prescriptions on File Prior to Encounter  Medication Sig Dispense Refill  . albuterol (PROVENTIL HFA;VENTOLIN HFA) 108 (90 Base) MCG/ACT inhaler Inhale 2 puffs into the lungs every 4 (four) hours as needed for wheezing or shortness of breath.    Marland Kitchen albuterol (PROVENTIL) (2.5 MG/3ML) 0.083% nebulizer solution Take 3 mLs (2.5 mg total) by nebulization every 4 (four) hours as needed for wheezing or shortness of breath. 75 mL 2  . amLODipine (NORVASC) 5 MG tablet Take 5 mg by mouth daily.    Marland Kitchen azithromycin (ZITHROMAX) 250 MG tablet Take 1 tablet (250 mg total) by mouth daily. 5 tablet 0  . beclomethasone (QVAR) 80 MCG/ACT inhaler Inhale 1 puff into the lungs 2 (two) times daily.    . carvedilol (COREG) 12.5 MG tablet Take 1 tablet (12.5 mg total) by mouth 2 (two) times daily with a meal. 30 tablet 0  . enalapril (VASOTEC) 20 MG tablet Take 20 mg by mouth 2 (two) times daily.     . fluticasone (FLONASE) 50 MCG/ACT  nasal spray Place 1 spray into both nostrils 2 (two) times daily.     . Fluticasone-Salmeterol (ADVAIR) 500-50 MCG/DOSE AEPB Inhale 1 puff into the lungs 2 (two) times daily.    . hydrochlorothiazide (HYDRODIURIL) 25 MG tablet Take 25 mg by mouth daily.    Marland Kitchen ipratropium-albuterol (DUONEB) 0.5-2.5 (3) MG/3ML SOLN Take 3 mLs by nebulization every 6 (six) hours as needed (for wheezing/shortness of breath).    . loratadine (CLARITIN) 10 MG tablet Take 10 mg by mouth daily.    . montelukast (SINGULAIR) 10 MG tablet Take 10 mg by mouth at bedtime.     . nitroGLYCERIN (NITROSTAT) 0.4 MG SL tablet Place 0.4 mg under the tongue every 5 (five) minutes as needed for chest pain.    . roflumilast (DALIRESP) 500 MCG TABS tablet Take 500 mcg by mouth daily.    Marland Kitchen tiotropium (SPIRIVA) 18 MCG inhalation capsule Place 18 mcg into inhaler and inhale daily.    Marland Kitchen LORazepam (ATIVAN) 0.5 MG tablet Take 2 tablets (1 mg total) by mouth every 8 (eight) hours as needed for anxiety. 15 tablet 0     Social History   Social History  . Marital Status: Single    Spouse Name: N/A  . Number of Children: N/A  . Years of Education: N/A   Occupational History  . Not on file.   Social History Main Topics  . Smoking status: Former Smoker --  2.00 packs/day for 35 years    Types: Cigarettes  . Smokeless tobacco: Not on file  . Alcohol Use: 0.0 oz/week    0 Standard drinks or equivalent per week  . Drug Use: Yes    Special: Marijuana  . Sexual Activity: Not Currently    Birth Control/ Protection: None   Other Topics Concern  . Not on file   Social History Narrative    Family History  Problem Relation Age of Onset  . Cancer Mother   . Hypertension Mother   . Diabetes Mother   . Asthma Son     ROS: No fever, myalgias/arthralgias, unexplained weight loss or weight gain No new focal weakness or sensory deficits No otalgia, hearing loss, visual changes, nasal and sinus symptoms, mouth and throat problems No  neck pain or adenopathy No abdominal pain, N/V/D, diarrhea, change in bowel pattern No dysuria, change in urinary pattern No LE edema or calf tenderness   Filed Vitals:   07/12/15 1100 07/12/15 1200 07/12/15 1300 07/12/15 1400  BP: 124/84 141/88 140/89 199/170  Pulse: 131 119 119 126  Temp:      TempSrc:      Resp: 20 16 19 21   Height:      Weight:      SpO2: 100% 100% 100% 100%     EXAM:  Gen: RASS +1, mod distress HEENT: NCAT, sclera white, oropharynx normal Neck: Supple without LAN, thyromegaly, JVD Lungs: breath sounds moderately diminished with prolonged diffuse expiratory wheezes Cardiovascular: Tachycardia, reg rhythm, no murmurs noted Abdomen: Soft, nontender, normal BS Ext: without clubbing, cyanosis, edema Neuro: CNs grossly intact, motor and sensory intact, DTRs symmetric Skin: Limited exam, no lesions noted  DATA:   BMP Latest Ref Rng 07/12/2015 05/31/2015 05/30/2015  Glucose 65 - 99 mg/dL 098(J116(H) 191(Y147(H) 782(N136(H)  BUN 6 - 20 mg/dL 11 17 14   Creatinine 0.44 - 1.00 mg/dL 5.620.74 1.300.78 8.650.79  Sodium 135 - 145 mmol/L 141 139 138  Potassium 3.5 - 5.1 mmol/L 4.1 4.0 4.9  Chloride 101 - 111 mmol/L 105 97(L) 99(L)  CO2 22 - 32 mmol/L 32 34(H) 35(H)  Calcium 8.9 - 10.3 mg/dL 9.5 9.4 9.1    CBC Latest Ref Rng 07/12/2015 05/31/2015 05/30/2015  WBC 3.6 - 11.0 K/uL 7.6 6.8 6.1  Hemoglobin 12.0 - 16.0 g/dL 78.413.0 69.612.8 29.512.9  Hematocrit 35.0 - 47.0 % 38.8 38.5 39.0  Platelets 150 - 440 K/uL 313 351 340    CXR:  Hyperinflated, NACPD  IMPRESSION:   Acute on chronic respiratory failure Severe COPD with asthmatic bronchitis Medical nonadherence Former smoker Anxiety  DISC: I note that she is on a non-selective beta blocker and on an ACEI, both of which can exacerbate reactive airways disease. Upon discharge home, will consider changing carvedilol to metoprolol or atenolol and ACEI to an ARB  PLAN:  ICU admission PRN BiPAP Supplemental O2 Systemic steroids Nebulized steroids and  bronchodilators - levalbuterol due to tachycardia Empiric antibiotics - ceftriaxone Cont montelukast and roflumilast Low dose alprazolam Upon DC home, consider changing carvedilol to atenolol and ACEI to ARB   Billy Fischeravid Gregoire Bennis, MD PCCM service Mobile [phone] Pager [phone] 07/12/2015

## 2015-07-12 NOTE — Care Management (Signed)
Patient admitted to icu due to sever respiratory distress due to exac of copd.  she [facility] and says she has home health nursing through same agency.  Rceives medicaid pcs services 7 days a week 7-10:30 a through [facility].  She has had 8 presentations to the ED and 5 admissions within the last 6 months

## 2015-07-12 NOTE — ED Provider Notes (Signed)
Received signout on patient at 7:05 AM from Dr. Manson PasseyBrown. Patient with chest pain shortness of breath increased work of breathing hypoxia. Oxygenation is normalized on BiPAP and she is breathing more comfortably. Remains tachycardic at 150, sinus tach on EKG. Chest x-ray and labs unremarkable, slight ordered a CT angiogram of the chest to evaluate for PE with her recent hospitalization 6 weeks ago.  Case discussed with Dr. Darrol AngelSimons at 8:00 AM for admission to ICU  Julie CheekPhillip Julie Kendra, MD 07/12/15 [phone]

## 2015-07-12 NOTE — ED Notes (Signed)
X-ray at bedside

## 2015-07-13 DIAGNOSIS — J9622 Acute and chronic respiratory failure with hypercapnia: Secondary | ICD-10-CM

## 2015-07-13 DIAGNOSIS — R000 Tachycardia, unspecified: Secondary | ICD-10-CM

## 2015-07-13 DIAGNOSIS — J9621 Acute and chronic respiratory failure with hypoxia: Secondary | ICD-10-CM

## 2015-07-13 LAB — CBC
HEMATOCRIT: 36.5 % (ref 35.0–47.0)
HEMOGLOBIN: 12.1 g/dL (ref 12.0–16.0)
MCH: 32.6 pg (ref 26.0–34.0)
MCHC: 33.1 g/dL (ref 32.0–36.0)
MCV: 98.7 fL (ref 80.0–100.0)
Platelets: 255 10*3/uL (ref 150–440)
RBC: 3.7 MIL/uL — ABNORMAL LOW (ref 3.80–5.20)
RDW: 13.7 % (ref 11.5–14.5)
WBC: 10.6 10*3/uL (ref 3.6–11.0)

## 2015-07-13 LAB — BASIC METABOLIC PANEL
ANION GAP: 9 (ref 5–15)
BUN: 17 mg/dL (ref 6–20)
CALCIUM: 9.7 mg/dL (ref 8.9–10.3)
CHLORIDE: 99 mmol/L — AB (ref 101–111)
CO2: 29 mmol/L (ref 22–32)
CREATININE: 0.8 mg/dL (ref 0.44–1.00)
GFR calc non Af Amer: >60 mL/min (ref >60)
Glucose, Bld: 190 mg/dL — ABNORMAL HIGH (ref 65–99)
Potassium: 4.1 mmol/L (ref 3.5–5.1)
Sodium: 137 mmol/L (ref 135–145)

## 2015-07-13 LAB — TROPONIN I

## 2015-07-13 MED ORDER — MONTELUKAST SODIUM 10 MG PO TABS: 10.0000 mg | ORAL_TABLET | Freq: Every day | ORAL | Status: AC

## 2015-07-13 MED ORDER — AMLODIPINE BESYLATE 5 MG PO TABS: 5.0000 mg | ORAL_TABLET | Freq: Every day | ORAL | Status: AC

## 2015-07-13 MED ORDER — ATENOLOL 50 MG PO TABS: 50.0000 mg | ORAL_TABLET | Freq: Every day | ORAL | Status: AC

## 2015-07-13 MED ORDER — ONDANSETRON HCL 4 MG/2ML IJ SOLN: 4.0000 mg | Freq: Four times a day (QID) | INTRAMUSCULAR | Status: DC | PRN

## 2015-07-13 MED ORDER — ATENOLOL 50 MG PO TABS: 50.0000 mg | ORAL_TABLET | Freq: Every day | ORAL | Status: DC

## 2015-07-13 MED ORDER — ALBUTEROL SULFATE (2.5 MG/3ML) 0.083% IN NEBU: 2.5000 mg | INHALATION_SOLUTION | Freq: Four times a day (QID) | RESPIRATORY_TRACT | Status: DC

## 2015-07-13 MED ORDER — METHYLPREDNISOLONE SODIUM SUCC 125 MG IJ SOLR: 80.0000 mg | Freq: Three times a day (TID) | INTRAMUSCULAR | Status: DC

## 2015-07-13 MED ORDER — ROFLUMILAST 500 MCG PO TABS: 500.0000 ug | ORAL_TABLET | Freq: Every day | ORAL | Status: AC

## 2015-07-13 MED ORDER — LEVALBUTEROL HCL 1.25 MG/0.5ML IN NEBU: 1.2500 mg | INHALATION_SOLUTION | Freq: Four times a day (QID) | RESPIRATORY_TRACT | Status: DC

## 2015-07-13 MED ORDER — IPRATROPIUM BROMIDE 0.02 % IN SOLN: 0.5000 mg | Freq: Four times a day (QID) | RESPIRATORY_TRACT | Status: DC

## 2015-07-13 MED ORDER — LOSARTAN POTASSIUM 50 MG PO TABS: 25.0000 mg | ORAL_TABLET | Freq: Every day | ORAL | Status: DC

## 2015-07-13 MED ORDER — ALPRAZOLAM 0.25 MG PO TABS: 0.2500 mg | ORAL_TABLET | ORAL | Status: DC | PRN

## 2015-07-13 MED ORDER — HYDROCHLOROTHIAZIDE 25 MG PO TABS: 25.0000 mg | ORAL_TABLET | Freq: Every day | ORAL | Status: AC

## 2015-07-13 MED ORDER — PREDNISONE 20 MG PO TABS: ORAL_TABLET | ORAL | Status: DC

## 2015-07-13 MED ORDER — LEVALBUTEROL HCL 0.63 MG/3ML IN NEBU: 0.6300 mg | INHALATION_SOLUTION | RESPIRATORY_TRACT | Status: DC | PRN

## 2015-07-13 NOTE — Care Management (Signed)
[facility] is [facility]  [phone] .  She has an appointment with him on May 2.  Left message with that office and [facility] that patient may benefit from work up for home bipap or home cpap.  Patient says "I did not qualify one of the times I was here."    Found that she is not followed by [facility]. She is discharging today and declines home health nurse.  She is adamant that her pcs aide is "enough."  She is agreeable to contact with her pulmonologist in regards to assessment of home bipap/cpap.  Her PCP is [facility] Tw[facility]urkey [facility].  Provided patient with bus pass for transport home.  Says her daughter is upset with her and will not come and get her.

## 2015-07-13 NOTE — Care Management (Signed)
Received confirmation from Joni ReiningNicole- a nurse at [facility] that received concern about need for further pulmonary testing on patient.  Patient has not been seen in clinic since Jun 2016.  Discussed concern of the 8 ED visits and 6 inpatient admissions over 6 months.

## 2015-07-13 NOTE — Discharge Summary (Signed)
Physician Discharge Summary  Patient ID: Julie Foley MRN: [id number] DOB/AGE: 04-Apr-1955 60 y.o.  Admit date: 07/12/2015 Discharge date: 07/13/2015   Active Problems:   COPD exacerbation [facility])   Discharged Condition: good  Hospital Course:  Treated in ICU/SDU with BiPAP, systemic steroids, nebulized steroids and bronchodilators. Much improved at time of discharge and insistent that she go home.   It was felt that carvedilol and Lisinopril might be exacerbating her asthma/COPD. Carvedilol was changed to atenolol (more cardioselective) and lisinopril was stopped. Would recommend an ARB in its place if needed  Consults: None  Significant Diagnostic Studies: None  Treatments: as above   Discharge Exam: Blood pressure 120/84, pulse 64, temperature 97.5 F (36.4 C), temperature source Axillary, resp. rate 15, height  (1.575 m), weight 81.2 kg (179 lb 0.2 oz), SpO2 88 %.  No distress Diminished BS, no wheezes Reg, no M NABS, soft No LE edema Neuro intact  Disposition: 01-Home or Self Care  Discharge Instructions    Discharge patient    Complete by:  As directed   To home, self care            Medication List    STOP taking these medications        beclomethasone 80 MCG/ACT inhaler  Commonly known as:  QVAR     carvedilol 12.5 MG tablet  Commonly known as:  COREG     enalapril 20 MG tablet  Commonly known as:  VASOTEC      TAKE these medications        albuterol 108 (90 Base) MCG/ACT inhaler  Commonly known as:  PROVENTIL HFA;VENTOLIN HFA  Inhale 2 puffs into the lungs every 4 (four) hours as needed for wheezing or shortness of breath.     albuterol (2.5 MG/3ML) 0.083% nebulizer solution  Commonly known as:  PROVENTIL  Take 3 mLs (2.5 mg total) by nebulization 4 (four) times daily.     ALPRAZolam 0.25 MG tablet  Commonly known as:  XANAX  Take 1 tablet (0.25 mg total) by mouth every 4 (four) hours as needed for anxiety.     amLODipine 5 MG  tablet  Commonly known as:  NORVASC  Take 1 tablet (5 mg total) by mouth daily.     atenolol 50 MG tablet  Commonly known as:  TENORMIN  Take 1 tablet (50 mg total) by mouth daily.     azithromycin 250 MG tablet  Commonly known as:  ZITHROMAX  Take 1 tablet (250 mg total) by mouth daily.     fluticasone 50 MCG/ACT nasal spray  Commonly known as:  FLONASE  Place 1 spray into both nostrils 2 (two) times daily.     Fluticasone-Salmeterol 500-50 MCG/DOSE Aepb  Commonly known as:  ADVAIR  Inhale 1 puff into the lungs 2 (two) times daily.     hydrochlorothiazide 25 MG tablet  Commonly known as:  HYDRODIURIL  Take 1 tablet (25 mg total) by mouth daily.     ipratropium 0.02 % nebulizer solution  Commonly known as:  ATROVENT  Take 2.5 mLs (0.5 mg total) by nebulization 4 (four) times daily.     levalbuterol 1.25 MG/0.5ML nebulizer solution  Commonly known as:  XOPENEX  Take 1.25 mg by nebulization every 6 (six) hours.     levalbuterol 0.63 MG/3ML nebulizer solution  Commonly known as:  XOPENEX  Take 3 mLs (0.63 mg total) by nebulization every 3 (three) hours as needed for wheezing or shortness of breath.  loratadine 10 MG tablet  Commonly known as:  CLARITIN  Take 10 mg by mouth daily.     LORazepam 0.5 MG tablet  Commonly known as:  ATIVAN  Take 2 tablets (1 mg total) by mouth every 8 (eight) hours as needed for anxiety.        montelukast 10 MG tablet  Commonly known as:  SINGULAIR  Take 1 tablet (10 mg total) by mouth at bedtime.     ondansetron 4 MG/2ML Soln injection  Commonly known as:  ZOFRAN  Inject 2 mLs (4 mg total) into the vein every 6 (six) hours as needed for nausea.     roflumilast 500 MCG Tabs tablet  Commonly known as:  DALIRESP  Take 1 tablet (500 mcg total) by mouth daily.     tiotropium 18 MCG inhalation capsule  Commonly known as:  SPIRIVA  Place 18 mcg into inhaler and inhale daily.       Please keep your follow up appointments with your  primary care MD and your Pulmonologist (Dr Tula NakayamaBice) as previously prescribed.     Signed: Billy FischerDavid Jarmal Lewelling 07/13/2015, 9:45 AM

## 2015-07-13 NOTE — Progress Notes (Signed)
First contact with patient by this RN.  Asked by Lauris PoagAmelia RN to discharge patient.  Pt sitting on side of bed, dressed, with No IV's in place awaiting Discharge paperwork.  Explained discharge paperwork and gave pt prescriptions for Ventolin, Prednisone and Xanax per Sung AmabileSimonds MD.  Pt verbalized understanding of instructions, denies questions.  Pt discharged via wheelchair pushed by husband.

## 2015-08-17 ENCOUNTER — Encounter: Payer: Self-pay | Admitting: Urgent Care

## 2015-08-17 ENCOUNTER — Inpatient Hospital Stay
Admission: EM | Admit: 2015-08-17 | Discharge: 2015-08-17 | DRG: 189 | Disposition: A | Discharge status: Home / Self Care | Payer: Medicaid Other | Attending: Internal Medicine | Admitting: Internal Medicine

## 2015-08-17 ENCOUNTER — Emergency Department: Payer: Medicaid Other

## 2015-08-17 DIAGNOSIS — F1290 Cannabis use, unspecified, uncomplicated: Secondary | ICD-10-CM | POA: Diagnosis present

## 2015-08-17 DIAGNOSIS — I110 Hypertensive heart disease with heart failure: Secondary | ICD-10-CM | POA: Diagnosis present

## 2015-08-17 DIAGNOSIS — Z8249 Family history of ischemic heart disease and other diseases of the circulatory system: Secondary | ICD-10-CM

## 2015-08-17 DIAGNOSIS — I5032 Chronic diastolic (congestive) heart failure: Secondary | ICD-10-CM | POA: Diagnosis present

## 2015-08-17 DIAGNOSIS — Z809 Family history of malignant neoplasm, unspecified: Secondary | ICD-10-CM | POA: Diagnosis not present

## 2015-08-17 DIAGNOSIS — J9621 Acute and chronic respiratory failure with hypoxia: Principal | ICD-10-CM | POA: Diagnosis present

## 2015-08-17 DIAGNOSIS — J441 Chronic obstructive pulmonary disease with (acute) exacerbation: Secondary | ICD-10-CM | POA: Diagnosis present

## 2015-08-17 DIAGNOSIS — Z7951 Long term (current) use of inhaled steroids: Secondary | ICD-10-CM | POA: Diagnosis not present

## 2015-08-17 DIAGNOSIS — Z833 Family history of diabetes mellitus: Secondary | ICD-10-CM | POA: Diagnosis not present

## 2015-08-17 DIAGNOSIS — Z888 Allergy status to other drugs, medicaments and biological substances status: Secondary | ICD-10-CM | POA: Diagnosis not present

## 2015-08-17 DIAGNOSIS — Z9851 Tubal ligation status: Secondary | ICD-10-CM | POA: Diagnosis not present

## 2015-08-17 DIAGNOSIS — K219 Gastro-esophageal reflux disease without esophagitis: Secondary | ICD-10-CM | POA: Diagnosis present

## 2015-08-17 DIAGNOSIS — Z825 Family history of asthma and other chronic lower respiratory diseases: Secondary | ICD-10-CM

## 2015-08-17 DIAGNOSIS — Z79899 Other long term (current) drug therapy: Secondary | ICD-10-CM

## 2015-08-17 DIAGNOSIS — Z87891 Personal history of nicotine dependence: Secondary | ICD-10-CM | POA: Diagnosis not present

## 2015-08-17 DIAGNOSIS — J45909 Unspecified asthma, uncomplicated: Secondary | ICD-10-CM | POA: Diagnosis present

## 2015-08-17 LAB — BASIC METABOLIC PANEL
ANION GAP: 6 (ref 5–15)
BUN: 11 mg/dL (ref 6–20)
CALCIUM: 9.7 mg/dL (ref 8.9–10.3)
CO2: 31 mmol/L (ref 22–32)
Chloride: 103 mmol/L (ref 101–111)
Creatinine, Ser: 0.66 mg/dL (ref 0.44–1.00)
Glucose, Bld: 143 mg/dL — ABNORMAL HIGH (ref 65–99)
Potassium: 4.5 mmol/L (ref 3.5–5.1)
Sodium: 140 mmol/L (ref 135–145)

## 2015-08-17 LAB — CBC
HCT: 40.5 % (ref 35.0–47.0)
HEMOGLOBIN: 13.3 g/dL (ref 12.0–16.0)
MCH: 32.1 pg (ref 26.0–34.0)
MCHC: 32.9 g/dL (ref 32.0–36.0)
MCV: 97.4 fL (ref 80.0–100.0)
Platelets: 310 10*3/uL (ref 150–440)
RBC: 4.16 MIL/uL (ref 3.80–5.20)
RDW: 13.5 % (ref 11.5–14.5)
WBC: 4.4 10*3/uL (ref 3.6–11.0)

## 2015-08-17 LAB — BLOOD GAS, VENOUS
Acid-Base Excess: 5.2 mmol/L — ABNORMAL HIGH (ref 0.0–3.0)
Bicarbonate: 34.9 mEq/L — ABNORMAL HIGH (ref 21.0–28.0)
DELIVERY SYSTEMS: POSITIVE
FIO2: 0.5
O2 SAT: 96.8 %
PATIENT TEMPERATURE: 37
PCO2 VEN: 76 mmHg — AB (ref 44.0–60.0)
PO2 VEN: 100 mmHg — AB (ref 31.0–45.0)
pH, Ven: 7.27 — ABNORMAL LOW (ref 7.320–7.430)

## 2015-08-17 LAB — HEMOGLOBIN A1C: Hgb A1c MFr Bld: 5.4 % (ref 4.0–6.0)

## 2015-08-17 LAB — TROPONIN I

## 2015-08-17 LAB — MRSA PCR SCREENING: MRSA by PCR: NEGATIVE

## 2015-08-17 LAB — TSH: TSH: 0.677 u[IU]/mL (ref 0.350–4.500)

## 2015-08-17 MED ORDER — ONDANSETRON HCL 4 MG PO TABS: 4.0000 mg | ORAL_TABLET | Freq: Four times a day (QID) | ORAL | Status: DC | PRN

## 2015-08-17 MED ORDER — LORAZEPAM 2 MG/ML IJ SOLN
INTRAMUSCULAR | Status: AC
  Administered 2015-08-17: 1 mg via INTRAVENOUS
  Filled 2015-08-17: qty 1

## 2015-08-17 MED ORDER — LORAZEPAM 0.5 MG PO TABS: 0.5000 mg | ORAL_TABLET | Freq: Three times a day (TID) | ORAL | Status: DC | PRN

## 2015-08-17 MED ORDER — ONDANSETRON HCL 4 MG/2ML IJ SOLN: 4.0000 mg | Freq: Four times a day (QID) | INTRAMUSCULAR | Status: DC | PRN

## 2015-08-17 MED ORDER — PREDNISONE 20 MG PO TABS: 40.0000 mg | ORAL_TABLET | Freq: Every day | ORAL | Status: DC

## 2015-08-17 MED ORDER — PREDNISONE 10 MG PO TABS: 30.0000 mg | ORAL_TABLET | Freq: Every day | ORAL | Status: DC

## 2015-08-17 MED ORDER — FLUTICASONE FUROATE-VILANTEROL 200-25 MCG/INH IN AEPB
1.0000 | INHALATION_SPRAY | Freq: Every day | RESPIRATORY_TRACT | Status: DC
  Administered 2015-08-17: 1 via RESPIRATORY_TRACT
  Filled 2015-08-17: qty 28

## 2015-08-17 MED ORDER — ALBUTEROL SULFATE (2.5 MG/3ML) 0.083% IN NEBU
2.5000 mg | INHALATION_SOLUTION | Freq: Once | RESPIRATORY_TRACT | Status: AC
  Administered 2015-08-17: 2.5 mg via RESPIRATORY_TRACT

## 2015-08-17 MED ORDER — PREDNISONE 10 MG PO TABS: 10.0000 mg | ORAL_TABLET | Freq: Every day | ORAL | Status: DC

## 2015-08-17 MED ORDER — MORPHINE SULFATE (PF) 2 MG/ML IV SOLN: 2.0000 mg | INTRAVENOUS | Status: DC | PRN

## 2015-08-17 MED ORDER — PREDNISONE 10 MG (21) PO TBPK: ORAL_TABLET | ORAL | Status: DC

## 2015-08-17 MED ORDER — ALBUTEROL SULFATE HFA 108 (90 BASE) MCG/ACT IN AERS: 2.0000 | INHALATION_SPRAY | RESPIRATORY_TRACT | Status: DC | PRN

## 2015-08-17 MED ORDER — METHYLPREDNISOLONE SODIUM SUCC 125 MG IJ SOLR
80.0000 mg | Freq: Three times a day (TID) | INTRAMUSCULAR | Status: DC
Start: 2015-08-17 — End: 2015-08-17

## 2015-08-17 MED ORDER — MOMETASONE FURO-FORMOTEROL FUM 200-5 MCG/ACT IN AERO: 2.0000 | INHALATION_SPRAY | Freq: Two times a day (BID) | RESPIRATORY_TRACT | Status: DC

## 2015-08-17 MED ORDER — LEVALBUTEROL HCL 1.25 MG/0.5ML IN NEBU: 1.2500 mg | INHALATION_SOLUTION | Freq: Four times a day (QID) | RESPIRATORY_TRACT | Status: DC

## 2015-08-17 MED ORDER — FLUTICASONE PROPIONATE 50 MCG/ACT NA SUSP
1.0000 | Freq: Two times a day (BID) | NASAL | Status: DC
Start: 2015-08-17 — End: 2015-08-17
  Administered 2015-08-17: 1 via NASAL
  Filled 2015-08-17 (×2): qty 16

## 2015-08-17 MED ORDER — HYDROCHLOROTHIAZIDE 25 MG PO TABS
25.0000 mg | ORAL_TABLET | Freq: Every day | ORAL | Status: DC
  Administered 2015-08-17: 25 mg via ORAL
  Filled 2015-08-17: qty 1

## 2015-08-17 MED ORDER — AMLODIPINE BESYLATE 5 MG PO TABS
5.0000 mg | ORAL_TABLET | Freq: Every day | ORAL | Status: DC
  Administered 2015-08-17: 5 mg via ORAL
  Filled 2015-08-17: qty 1

## 2015-08-17 MED ORDER — MONTELUKAST SODIUM 10 MG PO TABS: 10.0000 mg | ORAL_TABLET | Freq: Every day | ORAL | Status: DC

## 2015-08-17 MED ORDER — ALBUTEROL SULFATE (2.5 MG/3ML) 0.083% IN NEBU
3.0000 mL | INHALATION_SOLUTION | RESPIRATORY_TRACT | Status: DC
  Administered 2015-08-17 (×2): 3 mL via RESPIRATORY_TRACT
  Filled 2015-08-17 (×2): qty 3

## 2015-08-17 MED ORDER — LORATADINE 10 MG PO TABS
10.0000 mg | ORAL_TABLET | Freq: Every day | ORAL | Status: DC
  Administered 2015-08-17: 10 mg via ORAL
  Filled 2015-08-17: qty 1

## 2015-08-17 MED ORDER — ACETAMINOPHEN 650 MG RE SUPP: 650.0000 mg | Freq: Four times a day (QID) | RECTAL | Status: DC | PRN

## 2015-08-17 MED ORDER — IPRATROPIUM BROMIDE 0.02 % IN SOLN: 0.5000 mg | Freq: Four times a day (QID) | RESPIRATORY_TRACT | Status: DC

## 2015-08-17 MED ORDER — ROFLUMILAST 500 MCG PO TABS
500.0000 ug | ORAL_TABLET | Freq: Every day | ORAL | Status: DC
  Administered 2015-08-17: 500 ug via ORAL
  Filled 2015-08-17: qty 1

## 2015-08-17 MED ORDER — METHYLPREDNISOLONE SODIUM SUCC 125 MG IJ SOLR
125.0000 mg | Freq: Once | INTRAMUSCULAR | Status: AC
  Administered 2015-08-17: 125 mg via INTRAVENOUS

## 2015-08-17 MED ORDER — PREDNISONE 20 MG PO TABS
20.0000 mg | ORAL_TABLET | Freq: Every day | ORAL | Status: DC
Start: 2015-08-20 — End: 2015-08-17

## 2015-08-17 MED ORDER — AZITHROMYCIN 250 MG PO TABS
500.0000 mg | ORAL_TABLET | Freq: Every day | ORAL | Status: AC
  Administered 2015-08-17: 500 mg via ORAL
  Filled 2015-08-17: qty 2

## 2015-08-17 MED ORDER — METHYLPREDNISOLONE SODIUM SUCC 125 MG IJ SOLR: 125.0000 mg | Freq: Once | INTRAMUSCULAR | Status: DC

## 2015-08-17 MED ORDER — EPINEPHRINE 0.3 MG/0.3ML IJ SOAJ: 0.3000 mg | Freq: Once | INTRAMUSCULAR | Status: AC

## 2015-08-17 MED ORDER — LORAZEPAM 0.5 MG PO TABS
1.0000 mg | ORAL_TABLET | Freq: Three times a day (TID) | ORAL | Status: DC | PRN
  Administered 2015-08-17: 1 mg via ORAL
  Filled 2015-08-17 (×2): qty 2

## 2015-08-17 MED ORDER — IPRATROPIUM BROMIDE 0.02 % IN SOLN
0.5000 mg | Freq: Four times a day (QID) | RESPIRATORY_TRACT | Status: DC
  Administered 2015-08-17 (×2): 0.5 mg via RESPIRATORY_TRACT
  Filled 2015-08-17 (×2): qty 2.5

## 2015-08-17 MED ORDER — ALBUTEROL SULFATE (2.5 MG/3ML) 0.083% IN NEBU: 2.5000 mg | INHALATION_SOLUTION | Freq: Four times a day (QID) | RESPIRATORY_TRACT | Status: DC | PRN

## 2015-08-17 MED ORDER — LORAZEPAM 2 MG/ML IJ SOLN
1.0000 mg | Freq: Once | INTRAMUSCULAR | Status: AC
  Administered 2015-08-17: 1 mg via INTRAVENOUS

## 2015-08-17 MED ORDER — AZITHROMYCIN 250 MG PO TABS: 250.0000 mg | ORAL_TABLET | Freq: Every day | ORAL | Status: DC

## 2015-08-17 MED ORDER — ENOXAPARIN SODIUM 40 MG/0.4ML ~~LOC~~ SOLN: 40.0000 mg | SUBCUTANEOUS | Status: DC

## 2015-08-17 MED ORDER — ALBUTEROL SULFATE (2.5 MG/3ML) 0.083% IN NEBU: 2.5000 mg | INHALATION_SOLUTION | Freq: Four times a day (QID) | RESPIRATORY_TRACT | Status: DC

## 2015-08-17 MED ORDER — ALPRAZOLAM 0.25 MG PO TABS: 0.2500 mg | ORAL_TABLET | ORAL | Status: DC | PRN

## 2015-08-17 MED ORDER — ALBUTEROL SULFATE (2.5 MG/3ML) 0.083% IN NEBU
3.0000 mL | INHALATION_SOLUTION | RESPIRATORY_TRACT | Status: DC | PRN
  Administered 2015-08-17: 3 mL via RESPIRATORY_TRACT

## 2015-08-17 MED ORDER — PREDNISONE 10 MG PO TABS
50.0000 mg | ORAL_TABLET | Freq: Every day | ORAL | Status: AC
  Administered 2015-08-17: 50 mg via ORAL
  Filled 2015-08-17: qty 1

## 2015-08-17 MED ORDER — TIOTROPIUM BROMIDE MONOHYDRATE 18 MCG IN CAPS
18.0000 ug | ORAL_CAPSULE | Freq: Every day | RESPIRATORY_TRACT | Status: DC
  Administered 2015-08-17: 18 ug via RESPIRATORY_TRACT
  Filled 2015-08-17: qty 5

## 2015-08-17 MED ORDER — NITROGLYCERIN 0.4 MG SL SUBL: 0.4000 mg | SUBLINGUAL_TABLET | SUBLINGUAL | Status: DC | PRN

## 2015-08-17 MED ORDER — ACETAMINOPHEN 325 MG PO TABS: 650.0000 mg | ORAL_TABLET | Freq: Four times a day (QID) | ORAL | Status: DC | PRN

## 2015-08-17 MED ORDER — ATENOLOL 50 MG PO TABS
50.0000 mg | ORAL_TABLET | Freq: Every day | ORAL | Status: DC
  Administered 2015-08-17: 50 mg via ORAL
  Filled 2015-08-17: qty 1

## 2015-08-17 MED ORDER — DOCUSATE SODIUM 100 MG PO CAPS
100.0000 mg | ORAL_CAPSULE | Freq: Two times a day (BID) | ORAL | Status: DC
  Administered 2015-08-17: 100 mg via ORAL
  Filled 2015-08-17: qty 1

## 2015-08-17 NOTE — ED Notes (Signed)
Patient requesting Ativan - feeling very anxious. MD made aware. VORB for Ativan 1mg  IVP - order to be entered and carried by this RN.

## 2015-08-17 NOTE — ED Notes (Signed)
MD to bedside. Patient coughing and tearing. Sats remain stable. MD convinced patient to allow Bipap to be reapplied. Bipap resumed at previous settings. Will continue to monitor.

## 2015-08-17 NOTE — H&P (Addendum)
Julie Foley is an 60 y.o. female.   Chief Complaint: Shortness of breath HPI: The patient with past medical history significant for COPD who is well-known to the hospitalist service presents emergency department in respiratory distress. She states that she became short of breath with paroxysms of cough earlier this evening. She gradually worsened to the point that she felt as if she could not breathe. She denies chest pain, nausea, vomiting, or diarrhea. EMS placed the patient on BiPAP. She was given Solu-Medrol in the emergency department as well as a trial of supplement oxygen via nasal cannula. However, the patient continued to have accessory muscle use as well as significant expiratory wheeze which prompted the emergency department staff to call for admission.  Past Medical History  Diagnosis Date  . COPD (chronic obstructive pulmonary disease) ([place])   . CHF (congestive heart failure) ([place])   . HTN (hypertension)   . GERD (gastroesophageal reflux disease)   . Depression   . Anxiety   . Chronic respiratory failure ([place])   . Asthma     Past Surgical History  Procedure Laterality Date  . Tubal ligation Bilateral     Family History  Problem Relation Age of Onset  . Cancer Mother   . Hypertension Mother   . Diabetes Mother   . Asthma Son    Social History:  reports that she has quit smoking. Her smoking use included Cigarettes. She has a 70 pack-year smoking history. She does not have any smokeless tobacco history on file. She reports that she drinks alcohol. She reports that she uses illicit drugs (Marijuana).  Allergies:  Allergies  Allergen Reactions  . Symbicort [Budesonide-Formoterol Fumarate] Swelling and Other (See Comments)    Reaction:  Tongue swelling   . Formoterol Fumarate Swelling and Other (See Comments)    Reaction:  Tongue swelling    Medications Prior to Admission  Medication Sig Dispense Refill  . albuterol (PROVENTIL HFA;VENTOLIN HFA) 108 (90 Base)  MCG/ACT inhaler Inhale 2 puffs into the lungs every 4 (four) hours as needed for wheezing or shortness of breath.    Marland Kitchen albuterol (PROVENTIL) (2.5 MG/3ML) 0.083% nebulizer solution Take 3 mLs (2.5 mg total) by nebulization 4 (four) times daily. 75 mL 2  . ALPRAZolam (XANAX) 0.25 MG tablet Take 1 tablet (0.25 mg total) by mouth every 4 (four) hours as needed for anxiety. 30 tablet 0  . amLODipine (NORVASC) 5 MG tablet Take 1 tablet (5 mg total) by mouth daily. 30 tablet 10  . atenolol (TENORMIN) 50 MG tablet Take 1 tablet (50 mg total) by mouth daily. 30 tablet 10  . azithromycin (ZITHROMAX) 250 MG tablet Take 1 tablet (250 mg total) by mouth daily. 5 tablet 0  . fluticasone (FLONASE) 50 MCG/ACT nasal spray Place 1 spray into both nostrils 2 (two) times daily.     . Fluticasone-Salmeterol (ADVAIR) 500-50 MCG/DOSE AEPB Inhale 1 puff into the lungs 2 (two) times daily.    . hydrochlorothiazide (HYDRODIURIL) 25 MG tablet Take 1 tablet (25 mg total) by mouth daily. 30 tablet 10  . ipratropium (ATROVENT) 0.02 % nebulizer solution Take 2.5 mLs (0.5 mg total) by nebulization 4 (four) times daily. 75 mL 12  . levalbuterol (XOPENEX) 0.63 MG/3ML nebulizer solution Take 3 mLs (0.63 mg total) by nebulization every 3 (three) hours as needed for wheezing or shortness of breath. 3 mL 12  . levalbuterol (XOPENEX) 1.25 MG/0.5ML nebulizer solution Take 1.25 mg by nebulization every 6 (six) hours. 1 each 12  .  loratadine (CLARITIN) 10 MG tablet Take 10 mg by mouth daily.    . methylPREDNISolone sodium succinate (SOLU-MEDROL) 125 mg/2 mL injection Inject 1.28 mLs (80 mg total) into the vein every 8 (eight) hours. 1 each 0  . montelukast (SINGULAIR) 10 MG tablet Take 1 tablet (10 mg total) by mouth at bedtime. 30 tablet 10  . ondansetron (ZOFRAN) 4 MG/2ML SOLN injection Inject 2 mLs (4 mg total) into the vein every 6 (six) hours as needed for nausea. 2 mL 0  . predniSONE (DELTASONE) 20 MG tablet 3 tabs (60 mg) daily for 3  days, Then 2 tabs (40 mg) daily for 3 days, Then 1 tab (20 mg) daily for 3 days, Then stop 18 tablet 0  . roflumilast (DALIRESP) 500 MCG TABS tablet Take 1 tablet (500 mcg total) by mouth daily. 30 tablet 10  . tiotropium (SPIRIVA) 18 MCG inhalation capsule Place 18 mcg into inhaler and inhale daily.    Marland Kitchen LORazepam (ATIVAN) 0.5 MG tablet Take 2 tablets (1 mg total) by mouth every 8 (eight) hours as needed for anxiety. 15 tablet 0  . nitroGLYCERIN (NITROSTAT) 0.4 MG SL tablet Place 0.4 mg under the tongue every 5 (five) minutes as needed for chest pain.      Results for orders placed or performed during the hospital encounter of 08/17/15 (from the past 48 hour(s))  Basic metabolic panel     Status: Abnormal   Collection Time: 08/17/15  2:03 AM  Result Value Ref Range   Sodium 140 135 - 145 mmol/L   Potassium 4.5 3.5 - 5.1 mmol/L   Chloride 103 101 - 111 mmol/L   CO2 31 22 - 32 mmol/L   Glucose, Bld 143 (H) 65 - 99 mg/dL   BUN 11 6 - 20 mg/dL   Creatinine, Ser 0.66 0.44 - 1.00 mg/dL   Calcium 9.7 8.9 - 10.3 mg/dL   GFR calc non Af Amer >60 >60 mL/min   GFR calc Af Amer >60 >60 mL/min    Comment: (NOTE) The eGFR has been calculated using the CKD EPI equation. This calculation has not been validated in all clinical situations. eGFR's persistently <60 mL/min signify possible Chronic Kidney Disease.    Anion gap 6 5 - 15  CBC     Status: None   Collection Time: 08/17/15  2:03 AM  Result Value Ref Range   WBC 4.4 3.6 - 11.0 K/uL   RBC 4.16 3.80 - 5.20 MIL/uL   Hemoglobin 13.3 12.0 - 16.0 g/dL   HCT 40.5 35.0 - 47.0 %   MCV 97.4 80.0 - 100.0 fL   MCH 32.1 26.0 - 34.0 pg   MCHC 32.9 32.0 - 36.0 g/dL   RDW 13.5 11.5 - 14.5 %   Platelets 310 150 - 440 K/uL  Troponin I     Status: None   Collection Time: 08/17/15  2:03 AM  Result Value Ref Range   Troponin I <0.03 <0.031 ng/mL    Comment:        NO INDICATION OF MYOCARDIAL INJURY.   Blood gas, venous     Status: Abnormal    Collection Time: 08/17/15  2:04 AM  Result Value Ref Range   FIO2 0.50    Delivery systems BILEVEL POSITIVE AIRWAY PRESSURE    pH, Ven 7.27 (L) 7.320 - 7.430   pCO2, Ven 76 (HH) 44.0 - 60.0 mmHg    Comment: CRITICAL RESULT CALLED TO, READ BACK BY AND VERIFIED WITH: DR.R.BROWN AT 0215  ON 08/17/15 KSL    pO2, Ven 100.0 (H) 31.0 - 45.0 mmHg   Bicarbonate 34.9 (H) 21.0 - 28.0 mEq/L   Acid-Base Excess 5.2 (H) 0.0 - 3.0 mmol/L   O2 Saturation 96.8 %   Patient temperature 37.0    Collection site VENOUS    Sample type VENOUS    Dg Chest Port 1 View  08/17/2015  CLINICAL DATA:  60 year old female with respiratory distress EXAM: PORTABLE CHEST 1 VIEW COMPARISON:  Chest radiograph dated 07/12/2015 FINDINGS: The heart size and mediastinal contours are within normal limits. Both lungs are clear. The visualized skeletal structures are unremarkable. IMPRESSION: No active disease. Electronically Signed   By: Anner Crete M.D.   On: 08/17/2015 02:16    Review of Systems  Constitutional: Negative for fever and chills.  HENT: Negative for sore throat and tinnitus.   Eyes: Negative for blurred vision and redness.  Respiratory: Positive for cough and shortness of breath.   Cardiovascular: Negative for chest pain, palpitations, orthopnea and PND.  Gastrointestinal: Negative for nausea, vomiting, abdominal pain and diarrhea.  Genitourinary: Negative for dysuria, urgency and frequency.  Musculoskeletal: Negative for myalgias and joint pain.  Skin: Negative for rash.       No lesions  Neurological: Negative for speech change, focal weakness and weakness.  Endo/Heme/Allergies: Does not bruise/bleed easily.       No temperature intolerance  Psychiatric/Behavioral: Negative for depression and suicidal ideas.    Blood pressure 146/91, pulse 126, temperature 97.9 F (36.6 C), temperature source Axillary, resp. rate 18, height 5' 2"  (1.575 m), weight 79.4 kg (175 lb 0.7 oz), SpO2 100 %. Physical Exam   Vitals reviewed. Constitutional: She is oriented to person, place, and time. She appears well-developed and well-nourished.  HENT:  Head: Normocephalic and atraumatic.  Mouth/Throat: Oropharynx is clear and moist.  Eyes: Conjunctivae and EOM are normal. Pupils are equal, round, and reactive to light. No scleral icterus.  Neck: Normal range of motion. Neck supple. No JVD present. No tracheal deviation present. No thyromegaly present.  Cardiovascular: Normal rate, regular rhythm and normal heart sounds.  Exam reveals no gallop and no friction rub.   No murmur heard. Respiratory: Effort normal. She has wheezes.  GI: Soft. Bowel sounds are normal. She exhibits no distension. There is no tenderness.  Genitourinary:  Deferred  Lymphadenopathy:    She has no cervical adenopathy.  Neurological: She is alert and oriented to person, place, and time. No cranial nerve deficit. She exhibits normal muscle tone.  Skin: Skin is warm and dry. No rash noted. No erythema.  Psychiatric: She has a normal mood and affect. Her behavior is normal. Judgment and thought content normal.     Assessment/Plan This is a 60 year old female admitted for acute on chronic respiratory failure with hypoxemia. 1. Acute on chronic respiratory failure: With hypoxemia; secondary to COPD exacerbation. This is a frequent occurrence for this patient. I place her on a steroid taper and we will give her azithromycin for anti-inflammatory effect. I will clarify her inhaler regimen and continue her inhaled corticosteroid, roflumilast, as well as Spiriva. 2. CHF: Stable, diastolic; no diuretic therapy necessary. 3. Hypertension: Continue amlodipine, atenolol and hydrochlorothiazide. 4. Tobacco exposure: The patient reports that she has quit smoking but she smells heavily of cigarette smoke. Perhaps she is exposed to secondhand smoke in her home as she has reported in the past. Encouraged patient to avoid inhaled irritants. 5. DVT  prophylaxis: Lovenox 6. GI prophylaxis: None The patient is  a full code. Time spent on admission orders and critical care approximately 45 minutes (start 3:45 AM, end 4:50 AM).  Harrie Foreman, MD 08/17/2015, 5:47 AM

## 2015-08-17 NOTE — ED Notes (Signed)
Patient maintaining oxygen saturations on 4L/[place]; sats 94-97%. MD made aware.

## 2015-08-17 NOTE — ED Notes (Signed)
Patient presents to the ED via EMS from home. Patient in respiratory distress; advises that she has been "getting bad" all day. EMS gave Duonebs x 2 and an Atrovent SVN x 1. Patient put on CPAP be EMS upon their arrival. Patient switched over to Bipap when she arrived to the ED - Dr. Manson PasseyBrown to bedside for immediate assessment.

## 2015-08-17 NOTE — Progress Notes (Addendum)
Dc instructions and rx given. Pt wants to eat lunch before leaving. Verbalizes understanding. Upon discussion of pt's medical doctor, she states that the doctor does not like to see her because she often misses appointments. Pt has transportation issues. Will put in a social work consult for them to follow up with her after dc

## 2015-08-17 NOTE — Progress Notes (Signed)
Transported pt to ICU 4 without incident. Pt remains on Bipap.

## 2015-08-17 NOTE — ED Notes (Signed)
Report called to CCU, spoke with [place], Charity fundraiserN. Advised of presenting c/o, assessment, treatment, and transfer POC to CCU. Awaiting RT for transport at this time.

## 2015-08-17 NOTE — Progress Notes (Signed)
Pt and belongings not in room. Pt left room without escort to door,.

## 2015-08-17 NOTE — ED Notes (Signed)
Patient removed from Bipap and placed on supplemental oxygen at 4L/[place]. Will follow up on effectiveness.

## 2015-08-17 NOTE — ED Provider Notes (Signed)
[facility] Emergency Department Provider Note  ____________________________________________  Time seen: 2:15 AM  I have reviewed the triage vital signs and the nursing notes.   HISTORY  Chief Complaint Respiratory Distress      HPI Julie Foley is a 60 y.o. female presents via EMS with respiratory distress from home. Patient states that she's had progressive dyspnea over the course of yesterday. EMS states on her presentation patient's oxygen saturation was quite low on presentation patient's oxygen saturation was 82% on room air while being transferred from BiPAP. Patient received 2 DuoNeb's in route EMS with minimal improvement.     Past Medical History  Diagnosis Date  . COPD (chronic obstructive pulmonary disease) (HCC)   . CHF (congestive heart failure) (HCC)   . HTN (hypertension)   . GERD (gastroesophageal reflux disease)   . Depression   . Anxiety   . Chronic respiratory failure (HCC)   . Asthma     Patient Active Problem List   Diagnosis Date Noted  . Acute respiratory failure (HCC) 05/26/2015  . Sepsis (HCC) 02/16/2015  . COPD with acute exacerbation (HCC) 01/25/2015  . Acute respiratory failure with hypoxemia (HCC) 10/07/2014  . Anxiety 10/07/2014  . HTN (hypertension) 09/23/2014  . GERD (gastroesophageal reflux disease) 09/23/2014  . CHF (congestive heart failure) (HCC) 09/23/2014  . COPD exacerbation (HCC) 08/11/2014    Past Surgical History  Procedure Laterality Date  . Tubal ligation Bilateral     Current Outpatient Rx  Name  Route  Sig  Dispense  Refill  . albuterol (PROVENTIL HFA;VENTOLIN HFA) 108 (90 Base) MCG/ACT inhaler   Inhalation   Inhale 2 puffs into the lungs every 4 (four) hours as needed for wheezing or shortness of breath.         Marland Kitchen albuterol (PROVENTIL) (2.5 MG/3ML) 0.083% nebulizer solution   Nebulization   Take 3 mLs (2.5 mg total) by nebulization 4 (four) times daily.   75 mL   2   .  ALPRAZolam (XANAX) 0.25 MG tablet   Oral   Take 1 tablet (0.25 mg total) by mouth every 4 (four) hours as needed for anxiety.   30 tablet   0   . amLODipine (NORVASC) 5 MG tablet   Oral   Take 1 tablet (5 mg total) by mouth daily.   30 tablet   10   . atenolol (TENORMIN) 50 MG tablet   Oral   Take 1 tablet (50 mg total) by mouth daily.   30 tablet   10   . azithromycin (ZITHROMAX) 250 MG tablet   Oral   Take 1 tablet (250 mg total) by mouth daily.   5 tablet   0   . fluticasone (FLONASE) 50 MCG/ACT nasal spray   Each Nare   Place 1 spray into both nostrils 2 (two) times daily.          . Fluticasone-Salmeterol (ADVAIR) 500-50 MCG/DOSE AEPB   Inhalation   Inhale 1 puff into the lungs 2 (two) times daily.         . hydrochlorothiazide (HYDRODIURIL) 25 MG tablet   Oral   Take 1 tablet (25 mg total) by mouth daily.   30 tablet   10   . ipratropium (ATROVENT) 0.02 % nebulizer solution   Nebulization   Take 2.5 mLs (0.5 mg total) by nebulization 4 (four) times daily.   75 mL   12   . levalbuterol (XOPENEX) 0.63 MG/3ML nebulizer solution   Nebulization  Take 3 mLs (0.63 mg total) by nebulization every 3 (three) hours as needed for wheezing or shortness of breath.   3 mL   12   . levalbuterol (XOPENEX) 1.25 MG/0.5ML nebulizer solution   Nebulization   Take 1.25 mg by nebulization every 6 (six) hours.   1 each   12   . loratadine (CLARITIN) 10 MG tablet   Oral   Take 10 mg by mouth daily.         Marland Kitchen. LORazepam (ATIVAN) 0.5 MG tablet   Oral   Take 2 tablets (1 mg total) by mouth every 8 (eight) hours as needed for anxiety.   15 tablet   0   . methylPREDNISolone sodium succinate (SOLU-MEDROL) 125 mg/2 mL injection   Intravenous   Inject 1.28 mLs (80 mg total) into the vein every 8 (eight) hours.   1 each   0   . montelukast (SINGULAIR) 10 MG tablet   Oral   Take 1 tablet (10 mg total) by mouth at bedtime.   30 tablet   10   . nitroGLYCERIN  (NITROSTAT) 0.4 MG SL tablet   Sublingual   Place 0.4 mg under the tongue every 5 (five) minutes as needed for chest pain.         Marland Kitchen. ondansetron (ZOFRAN) 4 MG/2ML SOLN injection   Intravenous   Inject 2 mLs (4 mg total) into the vein every 6 (six) hours as needed for nausea.   2 mL   0   . predniSONE (DELTASONE) 20 MG tablet      3 tabs (60 mg) daily for 3 days, Then 2 tabs (40 mg) daily for 3 days, Then 1 tab (20 mg) daily for 3 days, Then stop   18 tablet   0   . roflumilast (DALIRESP) 500 MCG TABS tablet   Oral   Take 1 tablet (500 mcg total) by mouth daily.   30 tablet   10   . tiotropium (SPIRIVA) 18 MCG inhalation capsule   Inhalation   Place 18 mcg into inhaler and inhale daily.           Allergies Symbicort and Formoterol fumarate  Family History  Problem Relation Age of Onset  . Cancer Mother   . Hypertension Mother   . Diabetes Mother   . Asthma Son     Social History Social History  Substance Use Topics  . Smoking status: Former Smoker -- 2.00 packs/day for 35 years    Types: Cigarettes  . Smokeless tobacco: None  . Alcohol Use: 0.0 oz/week    0 Standard drinks or equivalent per week    Review of Systems  Constitutional: Negative for fever. Eyes: Negative for visual changes. ENT: Negative for sore throat. Cardiovascular: Negative for chest pain. Respiratory: Positive for shortness of breath. Gastrointestinal: Negative for abdominal pain, vomiting and diarrhea. Genitourinary: Negative for dysuria. Musculoskeletal: Negative for back pain. Skin: Negative for rash. Neurological: Negative for headaches, focal weakness or numbness.   10-point ROS otherwise negative.  ____________________________________________   PHYSICAL EXAM:  VITAL SIGNS: ED Triage Vitals  Enc Vitals Group     BP 08/17/15 0213 190/107 mmHg     Pulse Rate 08/17/15 0213 147     Resp 08/17/15 0213 36     Temp 08/17/15 0213 98.4 F (36.9 C)     Temp Source  08/17/15 0213 Axillary     SpO2 08/17/15 0213 100 %     Weight 08/17/15 0213 185 lb (83.915  kg)     Height --      Head Cir --      Peak Flow --      Pain Score 08/17/15 0215 0     Pain Loc --      Pain Edu? --      Excl. in GC? --      Constitutional: Alert and oriented. Apparent respiratory distress Eyes: Conjunctivae are normal. PERRL. Normal extraocular movements. ENT   Head: Normocephalic and atraumatic.   Nose: No congestion/rhinnorhea.   Mouth/Throat: Mucous membranes are moist.   Neck: No stridor. Hematological/Lymphatic/Immunilogical: No cervical lymphadenopathy. Cardiovascular: Tachycardia, regular rhythm. Normal and symmetric distal pulses are present in all extremities. No murmurs, rubs, or gallops. Respiratory: Tachypnea, positive accessory respiratory muscle use, diffuse coarse rhonchi bilaterally Gastrointestinal: Soft and nontender. No distention. There is no CVA tenderness. Genitourinary: deferred Musculoskeletal: Nontender with normal range of motion in all extremities. No joint effusions.  No lower extremity tenderness nor edema. Neurologic:  Normal speech and language. No gross focal neurologic deficits are appreciated. Speech is normal.  Skin:  Skin is warm, dry and intact. No rash noted. Psychiatric: Mood and affect are normal. Speech and behavior are normal. Patient exhibits appropriate insight and judgment.  ____________________________________________    LABS (pertinent positives/negatives)  Labs Reviewed  BASIC METABOLIC PANEL - Abnormal; Notable for the following:    Glucose, Bld 143 (*)    All other components within normal limits  BLOOD GAS, VENOUS - Abnormal; Notable for the following:    pH, Ven 7.27 (*)    pCO2, Ven 76 (*)    pO2, Ven 100.0 (*)    Bicarbonate 34.9 (*)    Acid-Base Excess 5.2 (*)    All other components within normal limits  CBC  TROPONIN I     ____________________________________________   EKG  ED ECG  REPORT I, [place] N BROWN, the attending physician, personally viewed and interpreted this ECG.   Date: 08/17/2015  EKG Time: 1:57 AM  Rate: 131  Rhythm: Sinus tachycardia  Axis: None  Intervals: Normal  ST&T Change: None   ____________________________________________    RADIOLOGY   DG Chest Port 1 View (Final result) Result time: 08/17/15 02:16:02   Final result by Rad Results In Interface (08/17/15 02:16:02)   Narrative:   CLINICAL DATA: 60 year old female with respiratory distress  EXAM: PORTABLE CHEST 1 VIEW  COMPARISON: Chest radiograph dated 07/12/2015  FINDINGS: The heart size and mediastinal contours are within normal limits. Both lungs are clear. The visualized skeletal structures are unremarkable.  IMPRESSION: No active disease.   Electronically Signed By: Elgie Collard M.D. On: 08/17/2015 02:16     ___________  Critical Care performed: CRITICAL CARE Performed by: Darci Current   Total critical care time: 45 minutes  Critical care time was exclusive of separately billable procedures and treating other patients.  Critical care was necessary to treat or prevent imminent or life-threatening deterioration.  Critical care was time spent personally by me on the following activities: development of treatment plan with patient and/or surrogate as well as nursing, discussions with consultants, evaluation of patient's response to treatment, examination of patient, obtaining history from patient or surrogate, ordering and performing treatments and interventions, ordering and review of laboratory studies, ordering and review of radiographic studies, pulse oximetry and re-evaluation of patient's condition.   ____________________________________________   INITIAL IMPRESSION / ASSESSMENT AND PLAN / ED COURSE  Pertinent labs & imaging results that were available during my care of the  patient were reviewed by me and considered in my medical  decision making (see chart for details).  BiPAP applied 3 albuterols given, Solumederol125 mg given. Patient discussed with Dr. Sheryle Hail for hospital admission for further evaluation and management.  ____________________________________________   FINAL CLINICAL IMPRESSION(S) / ED DIAGNOSES  Final diagnoses:  Acute on chronic respiratory failure with hypoxemia [facility])      Darci Current, MD 08/17/15 [phone]

## 2015-08-17 NOTE — ED Notes (Signed)
[facility]

## 2015-08-17 NOTE — Progress Notes (Signed)
Paged Dr Trudie ReedVanhhani. Pt wants to home.  She is off of bipap. Per her demand. Wanting regualr breakfast. And prescriptions to go home. He states he will come see her in about an hour. She states she will wait on him

## 2015-08-17 NOTE — ED Notes (Signed)
Patient refusing admission. MD to bedside to speak with patient.

## 2015-08-29 NOTE — Discharge Summary (Signed)
[facility] [place] [facility]   PATIENT NAME: Julie Foley    MR#:  [id number]  DATE OF BIRTH:  10/03/1955  DATE OF ADMISSION:  08/17/2015 ADMITTING PHYSICIAN: Arnaldo Natal, MD  DATE OF DISCHARGE: 08/17/2015  1:24 PM  PRIMARY CARE PHYSICIAN: Pcp Not In System    ADMISSION DIAGNOSIS:  Acute on chronic respiratory failure with hypoxemia (HCC) [J96.21]  DISCHARGE DIAGNOSIS:  Active Problems:   Acute on chronic respiratory failure with hypoxemia (HCC)    COPD exacerbation.  SECONDARY DIAGNOSIS:   Past Medical History  Diagnosis Date  . COPD (chronic obstructive pulmonary disease) (HCC)   . CHF (congestive heart failure) (HCC)   . HTN (hypertension)   . GERD (gastroesophageal reflux disease)   . Depression   . Anxiety   . Chronic respiratory failure (HCC)   . Asthma     HOSPITAL COURSE:   1. Acute on chronic respiratory failure: With hypoxemia; secondary to COPD exacerbation. This is a frequent occurrence for this patient. I place her on a steroid taper and we will give her azithromycin for anti-inflammatory effect. I will clarify her inhaler regimen and continue her inhaled corticosteroid, roflumilast, as well as Spiriva.  felt much better. 2. CHF: Stable, diastolic; no diuretic therapy necessary. 3. Hypertension: Continue amlodipine, atenolol and hydrochlorothiazide. 4. Tobacco exposure: The patient reports that she has quit smoking but she smells heavily of cigarette smoke. Perhaps she is exposed to secondhand smoke in her home as she has reported in the past. Encouraged patient to avoid inhaled irritants. 5. DVT prophylaxis: Lovenox 6. GI prophylaxis: None  DISCHARGE CONDITIONS:   Stable.  CONSULTS OBTAINED:     DRUG ALLERGIES:   Allergies  Allergen Reactions  . Symbicort [Budesonide-Formoterol Fumarate] Swelling and Other (See Comments)    Reaction:  Tongue swelling   . Formoterol Fumarate Swelling and Other (See Comments)     Reaction:  Tongue swelling    DISCHARGE MEDICATIONS:   Discharge Medication List as of 08/17/2015 12:18 PM    START taking these medications   Details  EPINEPHrine 0.3 mg/0.3 mL IJ SOAJ injection Inject 0.3 mLs (0.3 mg total) into the muscle once., Starting 08/17/2015, Print    predniSONE (STERAPRED UNI-PAK 21 TAB) 10 MG (21) TBPK tablet Take 6 tabs first day, 5 tab on day 2, then 4 on day 3rd, 3 tabs on day 4th , 2 tab on day 5th, and 1 tab on 6th day., Print      CONTINUE these medications which have CHANGED   Details  albuterol (PROVENTIL HFA;VENTOLIN HFA) 108 (90 Base) MCG/ACT inhaler Inhale 2 puffs into the lungs every 4 (four) hours as needed for wheezing or shortness of breath., Starting 08/17/2015, Until Discontinued, Print    albuterol (PROVENTIL) (2.5 MG/3ML) 0.083% nebulizer solution Take 3 mLs (2.5 mg total) by nebulization every 6 (six) hours as needed for wheezing or shortness of breath., Starting 08/17/2015, Until Discontinued, Print    ALPRAZolam (XANAX) 0.25 MG tablet Take 1 tablet (0.25 mg total) by mouth every 4 (four) hours as needed for anxiety., Starting 08/17/2015, Until Discontinued, Print    azithromycin (ZITHROMAX) 250 MG tablet Take 1 tablet (250 mg total) by mouth daily., Starting 08/17/2015, Until Discontinued, Print    LORazepam (ATIVAN) 0.5 MG tablet Take 1 tablet (0.5 mg total) by mouth every 8 (eight) hours as needed for anxiety., Starting 08/17/2015, Until Discontinued, Print      CONTINUE these medications which have NOT CHANGED  Details  amLODipine (NORVASC) 5 MG tablet Take 1 tablet (5 mg total) by mouth daily., Starting 07/13/2015, Until Discontinued, Normal    atenolol (TENORMIN) 50 MG tablet Take 1 tablet (50 mg total) by mouth daily., Starting 07/13/2015, Until Discontinued, Normal    fluticasone (FLONASE) 50 MCG/ACT nasal spray Place 1 spray into both nostrils 2 (two) times daily. , Until Discontinued, Historical Med    Fluticasone-Salmeterol  (ADVAIR) 500-50 MCG/DOSE AEPB Inhale 1 puff into the lungs 2 (two) times daily., Until Discontinued, Historical Med    hydrochlorothiazide (HYDRODIURIL) 25 MG tablet Take 1 tablet (25 mg total) by mouth daily., Starting 07/13/2015, Until Discontinued, Normal    ipratropium (ATROVENT) 0.02 % nebulizer solution Take 2.5 mLs (0.5 mg total) by nebulization 4 (four) times daily., Starting 07/13/2015, Until Discontinued, Normal    levalbuterol (XOPENEX) 0.63 MG/3ML nebulizer solution Take 3 mLs (0.63 mg total) by nebulization every 3 (three) hours as needed for wheezing or shortness of breath., Starting 07/13/2015, Until Discontinued, Normal    loratadine (CLARITIN) 10 MG tablet Take 10 mg by mouth daily., Until Discontinued, Historical Med    montelukast (SINGULAIR) 10 MG tablet Take 1 tablet (10 mg total) by mouth at bedtime., Starting 07/13/2015, Until Discontinued, Normal    ondansetron (ZOFRAN) 4 MG/2ML SOLN injection Inject 2 mLs (4 mg total) into the vein every 6 (six) hours as needed for nausea., Starting 07/13/2015, Until Discontinued, Normal    roflumilast (DALIRESP) 500 MCG TABS tablet Take 1 tablet (500 mcg total) by mouth daily., Starting 07/13/2015, Until Discontinued, Normal    tiotropium (SPIRIVA) 18 MCG inhalation capsule Place 18 mcg into inhaler and inhale daily., Until Discontinued, Historical Med    nitroGLYCERIN (NITROSTAT) 0.4 MG SL tablet Place 0.4 mg under the tongue every 5 (five) minutes as needed for chest pain., Until Discontinued, Historical Med      STOP taking these medications     levalbuterol (XOPENEX) 1.25 MG/0.5ML nebulizer solution      methylPREDNISolone sodium succinate (SOLU-MEDROL) 125 mg/2 mL injection      predniSONE (DELTASONE) 20 MG tablet          DISCHARGE INSTRUCTIONS:    Follow with PMD in 1-2 weeks.  If you experience worsening of your admission symptoms, develop shortness of breath, life threatening emergency, suicidal or homicidal thoughts  you must seek medical attention immediately by calling 911 or calling your MD immediately  if symptoms less severe.  You Must read complete instructions/literature along with all the possible adverse reactions/side effects for all the Medicines you take and that have been prescribed to you. Take any new Medicines after you have completely understood and accept all the possible adverse reactions/side effects.   Please note  You were cared for by a hospitalist during your hospital stay. If you have any questions about your discharge medications or the care you received while you were in the hospital after you are discharged, you can call the unit and asked to speak with the hospitalist on call if the hospitalist that took care of you is not available. Once you are discharged, your primary care physician will handle any further medical issues. Please note that NO REFILLS for any discharge medications will be authorized once you are discharged, as it is imperative that you return to your primary care physician (or establish a relationship with a primary care physician if you do not have one) for your aftercare needs so that they can reassess your need for medications and monitor your  lab values.    Today   CHIEF COMPLAINT:   Chief Complaint  Patient presents with  . Respiratory Distress    HISTORY OF PRESENT ILLNESS:  Julie Foley  is a 60 y.o. female with a known history of COPD who is well-known to the hospitalist service presents emergency department in respiratory distress. She states that she became short of breath with paroxysms of cough earlier this evening. She gradually worsened to the point that she felt as if she could not breathe. She denies chest pain, nausea, vomiting, or diarrhea. EMS placed the patient on BiPAP. She was given Solu-Medrol in the emergency department as well as a trial of supplement oxygen via nasal cannula. However, the patient continued to have accessory muscle use as  well as significant expiratory wheeze which prompted the emergency department staff to call for admission.  VITAL SIGNS:  Blood pressure 128/98, pulse 123, temperature 98.6 F (37 C), temperature source Oral, resp. rate 24, height 5\' 2"  (1.575 m), weight 79.4 kg (175 lb 0.7 oz), SpO2 96 %.  I/O:  No intake or output data in the 24 hours ending 08/29/15 1545  PHYSICAL EXAMINATION:  GENERAL:  60 y.o.-year-old patient lying in the bed with no acute distress.  EYES: Pupils equal, round, reactive to light and accommodation. No scleral icterus. Extraocular muscles intact.  HEENT: Head atraumatic, normocephalic. Oropharynx and nasopharynx clear.  NECK:  Supple, no jugular venous distention. No thyroid enlargement, no tenderness.  LUNGS: Normal breath sounds bilaterally, no wheezing, rales,rhonchi or crepitation. No use of accessory muscles of respiration.  CARDIOVASCULAR: S1, S2 normal. No murmurs, rubs, or gallops.  ABDOMEN: Soft, non-tender, non-distended. Bowel sounds present. No organomegaly or mass.  EXTREMITIES: No pedal edema, cyanosis, or clubbing.  NEUROLOGIC: Cranial nerves II through XII are intact. Muscle strength 5/5 in all extremities. Sensation intact. Gait not checked.  PSYCHIATRIC: The patient is alert and oriented x 3.  SKIN: No obvious rash, lesion, or ulcer.   DATA REVIEW:   CBC No results for input(s): WBC, HGB, HCT, PLT in the last 168 hours.  Chemistries  No results for input(s): NA, K, CL, CO2, GLUCOSE, BUN, CREATININE, CALCIUM, MG, AST, ALT, ALKPHOS, BILITOT in the last 168 hours.  Invalid input(s): GFRCGP  Cardiac Enzymes No results for input(s): TROPONINI in the last 168 hours.  Microbiology Results  Results for orders placed or performed during the hospital encounter of 08/17/15  MRSA PCR Screening     Status: None   Collection Time: 08/17/15  4:50 AM  Result Value Ref Range Status   MRSA by PCR NEGATIVE NEGATIVE Final    Comment:        The GeneXpert  MRSA Assay (FDA approved for NASAL specimens only), is one component of a comprehensive MRSA colonization surveillance program. It is not intended to diagnose MRSA infection nor to guide or monitor treatment for MRSA infections.     RADIOLOGY:  No results found.  EKG:   Orders placed or performed during the hospital encounter of 08/17/15  . ED EKG  . ED EKG  . EKG 12-Lead  . EKG 12-Lead      Management plans discussed with the patient, family and they are in agreement.  CODE STATUS:  Code Status History    Date Active Date Inactive Code Status Order ID Comments User Context   08/17/2015  4:46 AM 08/17/2015  4:25 PM Full Code [id number]  Arnaldo NatalMichael S Diamond, MD Inpatient   07/12/2015  9:18 AM 07/13/2015  3:21 PM Full Code [id number]  Merwyn Katos, MD ED   05/30/2015  3:47 PM 05/31/2015  2:18 PM Full Code [id number]  Milagros Loll, MD ED   05/26/2015  8:18 PM 05/27/2015 12:18 PM Full Code [id number]  Alford Highland, MD ED   02/16/2015  4:54 AM 02/17/2015  1:51 PM Full Code [id number]  Arnaldo Natal, MD Inpatient   01/25/2015  7:51 AM 01/25/2015  8:38 PM Full Code [id number]  Oralia Manis, MD Inpatient   01/02/2015 11:42 PM 01/03/2015  6:47 PM Full Code [id number]  Ramonita Lab, MD Inpatient   12/10/2014  9:32 PM 12/11/2014  1:12 PM Full Code [id number]  Auburn Bilberry, MD Inpatient   10/07/2014  3:54 AM 10/09/2014  1:35 PM Full Code [id number]  Oralia Manis, MD Inpatient   09/23/2014  3:18 AM 09/23/2014  1:57 PM Full Code [id number]  Oralia Manis, MD Inpatient   08/11/2014  6:08 PM 08/13/2014  3:53 PM Full Code [id number]  Adrian Saran, MD Inpatient      TOTAL TIME TAKING CARE OF THIS PATIENT: 35 minutes.    Altamese Dilling M.D on 08/29/2015 at 3:45 PM  Between 7am to 6pm - Pager - [phone]  After 6pm go to [email or web address] - password [email or web address]  [facility]  Office  [phone]  CC: Primary care physician; Pcp Not In System   Note: This dictation was prepared  with Dragon dictation along with smaller phrase technology. Any transcriptional errors that result from this process are unintentional.

## 2015-10-28 ENCOUNTER — Emergency Department
Admission: EM | Admit: 2015-10-28 | Discharge: 2015-10-28 | Disposition: A | Discharge status: Home / Self Care | Payer: Medicaid Other | Attending: Emergency Medicine | Admitting: Emergency Medicine

## 2015-10-28 ENCOUNTER — Encounter: Payer: Self-pay | Admitting: Emergency Medicine

## 2015-10-28 DIAGNOSIS — J449 Chronic obstructive pulmonary disease, unspecified: Secondary | ICD-10-CM

## 2015-10-28 DIAGNOSIS — I110 Hypertensive heart disease with heart failure: Secondary | ICD-10-CM | POA: Diagnosis not present

## 2015-10-28 DIAGNOSIS — J45909 Unspecified asthma, uncomplicated: Secondary | ICD-10-CM | POA: Insufficient documentation

## 2015-10-28 DIAGNOSIS — F410 Panic disorder [episodic paroxysmal anxiety] without agoraphobia: Secondary | ICD-10-CM | POA: Diagnosis not present

## 2015-10-28 DIAGNOSIS — Z87891 Personal history of nicotine dependence: Secondary | ICD-10-CM | POA: Insufficient documentation

## 2015-10-28 DIAGNOSIS — Z7951 Long term (current) use of inhaled steroids: Secondary | ICD-10-CM | POA: Diagnosis not present

## 2015-10-28 DIAGNOSIS — I509 Heart failure, unspecified: Secondary | ICD-10-CM | POA: Insufficient documentation

## 2015-10-28 DIAGNOSIS — R0602 Shortness of breath: Secondary | ICD-10-CM | POA: Diagnosis present

## 2015-10-28 MED ORDER — MOMETASONE FURO-FORMOTEROL FUM 200-5 MCG/ACT IN AERO: 2.0000 | INHALATION_SPRAY | Freq: Two times a day (BID) | RESPIRATORY_TRACT | 1 refills | Status: DC

## 2015-10-28 MED ORDER — LORAZEPAM 1 MG PO TABS: 1.0000 mg | ORAL_TABLET | Freq: Two times a day (BID) | ORAL | 0 refills | Status: DC

## 2015-10-28 MED ORDER — PREDNISONE 10 MG (21) PO TBPK: ORAL_TABLET | ORAL | 0 refills | Status: DC

## 2015-10-28 MED ORDER — DIAZEPAM 5 MG PO TABS
10.0000 mg | ORAL_TABLET | Freq: Once | ORAL | Status: AC
  Administered 2015-10-28: 10 mg via ORAL
  Filled 2015-10-28: qty 2

## 2015-10-28 NOTE — ED Triage Notes (Signed)
Pt arrived by EMS with c/o of SOB after waking from her nap the pt began to panic. Pt given 2 duonebs in route and 125 of solumedrol. Pt also states she has had a productive cough that started yesterday.

## 2015-10-28 NOTE — ED Provider Notes (Signed)
[facility] Emergency Department Provider Note    Time seen: ----------------------------------------- 6:51 PM on 10/28/2015 -----------------------------------------    I have reviewed the triage vital signs and the nursing notes.   HISTORY  Chief Complaint Shortness of Breath    HPI Julie Foley is a 60 y.o. female who presents to ER for shortness of breath after waking from her nap. Patient states "panic because the room was cold and when she gets to cold she starts to hyperventilate. She also reports she's run out of her anxiolytic medication. She was given 2 DuoNeb since around and Solu-Medrol which has helped her symptoms some. She presents requesting something for anxiety. She states she's had a productive cough that started yesterday.   Past Medical History:  Diagnosis Date  . Anxiety   . Asthma   . CHF (congestive heart failure) (HCC)   . Chronic respiratory failure (HCC)   . COPD (chronic obstructive pulmonary disease) (HCC)   . Depression   . GERD (gastroesophageal reflux disease)   . HTN (hypertension)     Patient Active Problem List   Diagnosis Date Noted  . Acute on chronic respiratory failure with hypoxemia (HCC) 08/17/2015  . Acute respiratory failure (HCC) 05/26/2015  . Sepsis (HCC) 02/16/2015  . COPD with acute exacerbation (HCC) 01/25/2015  . Acute respiratory failure with hypoxemia (HCC) 10/07/2014  . Anxiety 10/07/2014  . HTN (hypertension) 09/23/2014  . GERD (gastroesophageal reflux disease) 09/23/2014  . CHF (congestive heart failure) (HCC) 09/23/2014  . COPD exacerbation (HCC) 08/11/2014    Past Surgical History:  Procedure Laterality Date  . TUBAL LIGATION Bilateral     Allergies Symbicort [budesonide-formoterol fumarate] and Formoterol fumarate  Social History Social History  Substance Use Topics  . Smoking status: Former Smoker    Packs/day: 2.00    Years: 35.00    Types: Cigarettes  . Smokeless  tobacco: Never Used  . Alcohol use 0.0 oz/week    Review of Systems Constitutional: Negative for fever. Cardiovascular: Negative for chest pain. Respiratory: Positive shortness of breath Gastrointestinal: Negative for abdominal pain, vomiting and diarrhea. Genitourinary: Negative for dysuria. Musculoskeletal: Negative for back pain. Skin: Negative for rash. Neurological: Negative for headaches, focal weakness or numbness. Psychiatric: Positive for anxiety  10-point ROS otherwise negative.  ____________________________________________   PHYSICAL EXAM:  VITAL SIGNS: ED Triage Vitals  Enc Vitals Group     BP 10/28/15 1821 (!) 163/87     Pulse Rate 10/28/15 1821 (!) 104     Resp 10/28/15 1821 (!) 22     Temp 10/28/15 1821 98.3 F (36.8 C)     Temp src --      SpO2 10/28/15 1813 (!) 88 %     Weight --      Height --      Head Circumference --      Peak Flow --      Pain Score --      Pain Loc --      Pain Edu? --      Excl. in GC? --     Constitutional: Alert and oriented. Anxious, no acute distress Eyes: Conjunctivae are normal. PERRL. Normal extraocular movements. ENT   Head: Normocephalic and atraumatic.   Nose: No congestion/rhinnorhea.   Mouth/Throat: Mucous membranes are moist.   Neck: No stridor. Cardiovascular: Normal rate, regular rhythm. No murmurs, rubs, or gallops. Respiratory: Mild tachypnea with rhonchi bilaterally. Gastrointestinal: Soft and nontender. Normal bowel sounds Musculoskeletal: Nontender with normal range of motion  in all extremities. No lower extremity tenderness nor edema. Neurologic:  Normal speech and language. No gross focal neurologic deficits are appreciated.  Skin:  Skin is warm, dry and intact. No rash noted. Psychiatric: Anxious mood and affect ___________________________________________  ED COURSE:  Pertinent labs & imaging results that were available during my care of the patient were reviewed by me and  considered in my medical decision making (see chart for details). Clinical Course  Patient is in no acute distress, she is only requesting anxiolytic medication, states otherwise her breathing is at her baseline. She'll receive oral anxiolytic medication and reevaluation.  Procedures ____________________________________________  FINAL ASSESSMENT AND PLAN  Anxiety, COPD  Plan: Patient with a combination of anxiety and COPD. She is requesting something for anxiety as well as a specific inhaler. She is stable for outpatient follow-up with her doctor.   Emily Filbert, MD   Note: This dictation was prepared with Dragon dictation. Any transcriptional errors that result from this process are unintentional    Emily Filbert, MD 10/28/15 [phone]

## 2015-10-28 NOTE — ED Notes (Signed)
Patient states she had anxiety attack today which caused her shortness of breath. Pt states when she was napping her roommate turned the A/C up and it was so cold it made it hard to breathe and she got anxious. Patient states she just needs a nerve pill and Dulera inhaler and then she can go. Pt states she needs to be out by 8pm.

## 2015-11-09 ENCOUNTER — Emergency Department: Payer: Medicaid Other

## 2015-11-09 ENCOUNTER — Emergency Department
Admission: EM | Admit: 2015-11-09 | Discharge: 2015-11-09 | Disposition: A | Discharge status: Home / Self Care | Payer: Medicaid Other | Attending: Emergency Medicine | Admitting: Emergency Medicine

## 2015-11-09 DIAGNOSIS — J9610 Chronic respiratory failure, unspecified whether with hypoxia or hypercapnia: Secondary | ICD-10-CM | POA: Insufficient documentation

## 2015-11-09 DIAGNOSIS — J441 Chronic obstructive pulmonary disease with (acute) exacerbation: Secondary | ICD-10-CM

## 2015-11-09 DIAGNOSIS — R0602 Shortness of breath: Secondary | ICD-10-CM | POA: Diagnosis present

## 2015-11-09 DIAGNOSIS — Z87891 Personal history of nicotine dependence: Secondary | ICD-10-CM | POA: Insufficient documentation

## 2015-11-09 DIAGNOSIS — J45909 Unspecified asthma, uncomplicated: Secondary | ICD-10-CM | POA: Insufficient documentation

## 2015-11-09 DIAGNOSIS — I509 Heart failure, unspecified: Secondary | ICD-10-CM | POA: Diagnosis not present

## 2015-11-09 DIAGNOSIS — I110 Hypertensive heart disease with heart failure: Secondary | ICD-10-CM | POA: Insufficient documentation

## 2015-11-09 LAB — CBC
HCT: 42.5 % (ref 35.0–47.0)
Hemoglobin: 13.9 g/dL (ref 12.0–16.0)
MCH: 32.5 pg (ref 26.0–34.0)
MCHC: 32.7 g/dL (ref 32.0–36.0)
MCV: 99.4 fL (ref 80.0–100.0)
PLATELETS: 255 10*3/uL (ref 150–440)
RBC: 4.27 MIL/uL (ref 3.80–5.20)
RDW: 13.5 % (ref 11.5–14.5)
WBC: 9.4 10*3/uL (ref 3.6–11.0)

## 2015-11-09 LAB — BASIC METABOLIC PANEL
Anion gap: 7 (ref 5–15)
BUN: 18 mg/dL (ref 6–20)
CALCIUM: 9.1 mg/dL (ref 8.9–10.3)
CO2: 31 mmol/L (ref 22–32)
CREATININE: 0.87 mg/dL (ref 0.44–1.00)
Chloride: 101 mmol/L (ref 101–111)
GFR calc Af Amer: >60 mL/min (ref >60)
Glucose, Bld: 153 mg/dL — ABNORMAL HIGH (ref 65–99)
POTASSIUM: 4.3 mmol/L (ref 3.5–5.1)
SODIUM: 139 mmol/L (ref 135–145)

## 2015-11-09 LAB — TROPONIN I

## 2015-11-09 MED ORDER — PREDNISONE 20 MG PO TABS
60.0000 mg | ORAL_TABLET | Freq: Once | ORAL | Status: AC
  Administered 2015-11-09: 60 mg via ORAL
  Filled 2015-11-09: qty 3

## 2015-11-09 MED ORDER — IPRATROPIUM-ALBUTEROL 0.5-2.5 (3) MG/3ML IN SOLN
3.0000 mL | Freq: Once | RESPIRATORY_TRACT | Status: AC
  Administered 2015-11-09: 3 mL via RESPIRATORY_TRACT

## 2015-11-09 MED ORDER — IPRATROPIUM-ALBUTEROL 0.5-2.5 (3) MG/3ML IN SOLN
RESPIRATORY_TRACT | Status: AC
  Filled 2015-11-09: qty 3

## 2015-11-09 MED ORDER — PREDNISONE 20 MG PO TABS: 40.0000 mg | ORAL_TABLET | Freq: Every day | ORAL | 0 refills | Status: AC

## 2015-11-09 MED ORDER — ALPRAZOLAM 0.5 MG PO TABS: 0.5000 mg | ORAL_TABLET | Freq: Three times a day (TID) | ORAL | 0 refills | Status: DC | PRN

## 2015-11-09 NOTE — ED Provider Notes (Signed)
Time Seen: Approximately 1509  I have reviewed the triage notes  Chief Complaint: Shortness of Breath   History of Present Illness: Julie Foley is a 60 y.o. female who has a long history of anxiety, congestive heart failure, and COPD. She is on home oxygen at 4 L. Patient states she started having some increasing shortness of breath with anxiety which started yesterday. She states she ran out of her albuterol nebulizations at home and expects it's been delivered this afternoon. She started getting anxious because she didn't have her albuterol nebulizations. She denies any chest pain, shortness of breath at this time. She states she received 3 nebulizer treatments prior to my evaluation she feels comfortable and is requesting to be discharged at this time.   Past Medical History:  Diagnosis Date  . Anxiety   . Asthma   . CHF (congestive heart failure) (HCC)   . Chronic respiratory failure (HCC)   . COPD (chronic obstructive pulmonary disease) (HCC)   . Depression   . GERD (gastroesophageal reflux disease)   . HTN (hypertension)     Patient Active Problem List   Diagnosis Date Noted  . Acute on chronic respiratory failure with hypoxemia (HCC) 08/17/2015  . Acute respiratory failure (HCC) 05/26/2015  . Sepsis (HCC) 02/16/2015  . COPD with acute exacerbation (HCC) 01/25/2015  . Acute respiratory failure with hypoxemia (HCC) 10/07/2014  . Anxiety 10/07/2014  . HTN (hypertension) 09/23/2014  . GERD (gastroesophageal reflux disease) 09/23/2014  . CHF (congestive heart failure) (HCC) 09/23/2014  . COPD exacerbation (HCC) 08/11/2014    Past Surgical History:  Procedure Laterality Date  . TUBAL LIGATION Bilateral     Past Surgical History:  Procedure Laterality Date  . TUBAL LIGATION Bilateral     Current Outpatient Rx  . Order #: [id number] Class: Print  . Order #: [id number] Class: Print  . Order #: [id number] Class: Print  . Order #: [id number] Class: Normal  . Order #:  [id number] Class: Normal  . Order #: [id number] Class: Print  . Order #: [id number] Class: Print  . Order #: [id number] Class: Historical Med  . Order #: [id number] Class: Historical Med  . Order #: [id number] Class: Normal  . Order #: [id number] Class: Normal  . Order #: [id number] Class: Normal  . Order #: [id number] Class: Historical Med  . Order #: [id number] Class: Print  . Order #: [id number] Class: Normal  . Order #: [id number] Class: Historical Med  . Order #: [id number] Class: Normal  . [START ON 11/10/2015] Order #: [id number] Class: Print  . Order #: [id number] Class: Normal  . Order #: [id number] Class: Historical Med    Allergies:  Symbicort [budesonide-formoterol fumarate] and Formoterol fumarate  Family History: Family History  Problem Relation Age of Onset  . Cancer Mother   . Hypertension Mother   . Diabetes Mother   . Asthma Son     Social History: Social History  Substance Use Topics  . Smoking status: Former Smoker    Packs/day: 2.00    Years: 35.00    Types: Cigarettes  . Smokeless tobacco: Never Used  . Alcohol use 0.0 oz/week     Comment: ocassional beer drinker     Review of Systems:   10 point review of systems was performed and was otherwise negative:  Constitutional: No fever Eyes: No visual disturbances ENT: No sore throat, ear pain Cardiac: No chest pain Respiratory: Increased shortness of breath earlier today which started yesterday.  Abdomen: No abdominal pain, no vomiting, No diarrhea Endocrine: No weight loss, No night sweats Extremities: No peripheral edema, cyanosis Skin: No  rashes, easy bruising Neurologic: No focal weakness, trouble with speech or swollowing Urologic: No dysuria, Hematuria, or urinary frequency   Physical Exam:  ED Triage Vitals  Enc Vitals Group     BP 11/09/15 1325 132/88     Pulse Rate 11/09/15 1325 (!) 119     Resp 11/09/15 1325 19     Temp 11/09/15 1325 98.6 F (37 C)     Temp Source 11/09/15 1325 Oral     SpO2 11/09/15  1325 97 %     Weight 11/09/15 1332 180 lb (81.6 kg)     Height 11/09/15 1332 5\' 2"  (1.575 m)     Head Circumference --      Peak Flow --      Pain Score --      Pain Loc --      Pain Edu? --      Excl. in GC? --     General: Awake , Alert , and Oriented times 3; GCS 15. Speaks in full and complete sentences without any sign of respiratory distress Head: Normal cephalic , atraumatic Eyes: Pupils equal , round, reactive to light Nose/Throat: No nasal drainage, patent upper airway without erythema or exudate.  Neck: Supple, Full range of motion, No anterior adenopathy or palpable thyroid masses Lungs: Clear to ascultation without wheezes , rhonchi, or rales Heart: Regular rate, regular rhythm without murmurs , gallops , or rubs Abdomen: Soft, non tender without rebound, guarding , or rigidity; bowel sounds positive and symmetric in all 4 quadrants. No organomegaly .        Extremities: 2 plus symmetric pulses. No edema, clubbing or cyanosis Neurologic: normal ambulation, Motor symmetric without deficits, sensory intact Skin: warm, dry, no rashes   Labs:   All laboratory work was reviewed including any pertinent negatives or positives listed below:  Labs Reviewed  BASIC METABOLIC PANEL - Abnormal; Notable for the following:       Result Value   Glucose, Bld 153 (*)    All other components within normal limits  CBC  TROPONIN I  Laboratory work was reviewed and showed no clinically significant abnormalities.   EKG:  ED ECG REPORT I, Jennye Moccasin, the attending physician, personally viewed and interpreted this ECG.  Date: 11/09/2015 EKG Time: 1334 Rate: 117 Rhythm: normal sinus rhythm QRS Axis: normal Intervals: normal ST/T Wave abnormalities: Nonspecific T wave abnormality Conduction Disturbances: none Narrative Interpretation: unremarkable    Radiology: *  CLINICAL DATA:  Shortness of Breath  EXAM: CHEST  2 VIEW  COMPARISON:  08/17/2015  FINDINGS: The  heart size and mediastinal contours are within normal limits. Both lungs are clear. The visualized skeletal structures are unremarkable.  IMPRESSION: No active cardiopulmonary disease.   Electronically Signed   By: Alcide Clever M.D.   On: 11/09/2015 14:22    I personally reviewed the radiologic studies    ED Course:   Patient's stay here was uneventful and most of her treatment RA been established. She had not received any steroid therapy and states she is not currently on steroids at home. She was given 60 mg of prednisone here him be discharged on a bolus of prednisone over the next 5 days. She states she has her nebulizers likely waiting for her at home. She requested to switch from Ativan and Xanax for anxiety.    Clinical Course     Assessment:  Acute exacerbation of chronic obstructive pulmonary disease   Final Clinical Impression:   Final diagnoses:  COPD exacerbation (HCC)     Plan:  Outpatient Prednisone and Xanax prescriptions Patient was advised to return immediately if condition worsens. Patient was advised to follow up with their primary care physician or other specialized physicians involved in their outpatient care. The patient and/or family member/power of attorney had laboratory results reviewed at the bedside. All questions and concerns were addressed and appropriate discharge instructions were distributed by the nursing staff.             Jennye Moccasin, MD 11/09/15 [phone]

## 2015-11-09 NOTE — ED Triage Notes (Signed)
Pt came to ED via EMS from home c/o sob. Pt has history of COPD. Given 1 duoneb and 2 albuterol treatments per EMS.

## 2015-11-09 NOTE — ED Notes (Signed)
Brought pt food, a coke, and apple juice.

## 2015-11-09 NOTE — Discharge Instructions (Signed)
Return especially for fever, productive cough, increased shortness of breath, or any other new concerns Please return immediately if condition worsens. Please contact her primary physician or the physician you were given for referral. If you have any specialist physicians involved in her treatment and plan please also contact them. Thank you for using [facility].

## 2015-11-30 ENCOUNTER — Encounter: Payer: Self-pay | Admitting: *Deleted

## 2015-11-30 ENCOUNTER — Emergency Department
Admission: EM | Admit: 2015-11-30 | Discharge: 2015-11-30 | Discharge status: Against Medical Advice | Payer: Medicaid Other | Attending: Emergency Medicine | Admitting: Emergency Medicine

## 2015-11-30 DIAGNOSIS — Z87891 Personal history of nicotine dependence: Secondary | ICD-10-CM | POA: Insufficient documentation

## 2015-11-30 DIAGNOSIS — J441 Chronic obstructive pulmonary disease with (acute) exacerbation: Secondary | ICD-10-CM

## 2015-11-30 DIAGNOSIS — Z79899 Other long term (current) drug therapy: Secondary | ICD-10-CM | POA: Insufficient documentation

## 2015-11-30 DIAGNOSIS — I509 Heart failure, unspecified: Secondary | ICD-10-CM | POA: Insufficient documentation

## 2015-11-30 DIAGNOSIS — R0603 Acute respiratory distress: Secondary | ICD-10-CM

## 2015-11-30 DIAGNOSIS — J45909 Unspecified asthma, uncomplicated: Secondary | ICD-10-CM | POA: Insufficient documentation

## 2015-11-30 DIAGNOSIS — I110 Hypertensive heart disease with heart failure: Secondary | ICD-10-CM | POA: Insufficient documentation

## 2015-11-30 DIAGNOSIS — R0600 Dyspnea, unspecified: Secondary | ICD-10-CM | POA: Diagnosis present

## 2015-11-30 LAB — BLOOD GAS, VENOUS
ACID-BASE EXCESS: 10.9 mmol/L — AB (ref 0.0–2.0)
BICARBONATE: 40.1 mmol/L — AB (ref 20.0–28.0)
PCO2 VEN: 76 mmHg — AB (ref 44.0–60.0)
PH VEN: 7.33 (ref 7.250–7.430)
Patient temperature: 37

## 2015-11-30 MED ORDER — IPRATROPIUM-ALBUTEROL 0.5-2.5 (3) MG/3ML IN SOLN
3.0000 mL | Freq: Once | RESPIRATORY_TRACT | Status: AC
  Administered 2015-11-30: 3 mL via RESPIRATORY_TRACT
  Filled 2015-11-30: qty 3

## 2015-11-30 NOTE — Progress Notes (Signed)
Responded to respiratory distress call. Pt is refusing to place Bipap. Pt states that she doesn't need that. All she needs is her oxygen. Bipap on standby at bedside and pt placed on 4L O2

## 2015-11-30 NOTE — ED Triage Notes (Signed)
Pt c/o sudden onset of respiratory distress x 1 hr ago, unresolved w/ home neb tx's. Pt presents via EMS w/ albuterol neb tx in place. Pt has received duoneb tx, Magnesium 1 GM, 1 albuterol neb tx, solumedrol 125 mg IV. Pt states she is ready to go home now, feels better. Pt continues to have labored breathing w/ coarse, audible rhonchi and expiratory wheezes throughout.

## 2015-11-30 NOTE — ED Provider Notes (Signed)
[facility] Emergency Department Provider Note    First MD Initiated Contact with Patient 11/30/15 [phone]     (approximate)  I have reviewed the triage vital signs and the nursing notes.   HISTORY  Chief Complaint Respiratory Distress   HPI Julie Foley is a 60 y.o. female with history COPD CHF and hypertension presents via EMS with respiratory distress times one hour that was unresolved with home nebulizer treatments. Patient received a DuoNeb treatment as well as 1 g magnesium and Solu-Medrol 125 mg IV while in route with EMS. Patient presents to the emergency department with ongoing labored breathing. On my arrival to the room the patient states I'm ready to go home". Patient states that she got in an argument with her boyfriend which caused her difficulty breathing. Patient states I think I had a panic attack"   Past Medical History:  Diagnosis Date  . Anxiety   . Asthma   . CHF (congestive heart failure) (HCC)   . Chronic respiratory failure (HCC)   . COPD (chronic obstructive pulmonary disease) (HCC)   . Depression   . GERD (gastroesophageal reflux disease)   . HTN (hypertension)     Patient Active Problem List   Diagnosis Date Noted  . Acute on chronic respiratory failure with hypoxemia (HCC) 08/17/2015  . Acute respiratory failure (HCC) 05/26/2015  . Sepsis (HCC) 02/16/2015  . COPD with acute exacerbation (HCC) 01/25/2015  . Acute respiratory failure with hypoxemia (HCC) 10/07/2014  . Anxiety 10/07/2014  . HTN (hypertension) 09/23/2014  . GERD (gastroesophageal reflux disease) 09/23/2014  . CHF (congestive heart failure) (HCC) 09/23/2014  . COPD exacerbation (HCC) 08/11/2014    Past Surgical History:  Procedure Laterality Date  . TUBAL LIGATION Bilateral     Prior to Admission medications   Medication Sig Start Date End Date Taking? Authorizing Provider  albuterol (PROVENTIL HFA;VENTOLIN HFA) 108 (90 Base) MCG/ACT inhaler Inhale 2  puffs into the lungs every 4 (four) hours as needed for wheezing or shortness of breath. 08/17/15   Altamese DillingVaibhavkumar Vachhani, MD  albuterol (PROVENTIL) (2.5 MG/3ML) 0.083% nebulizer solution Take 3 mLs (2.5 mg total) by nebulization every 6 (six) hours as needed for wheezing or shortness of breath. 08/17/15   Altamese DillingVaibhavkumar Vachhani, MD  ALPRAZolam Prudy Feeler(XANAX) 0.5 MG tablet Take 1 tablet (0.5 mg total) by mouth 3 (three) times daily as needed for sleep or anxiety. 11/09/15 11/08/16  Jennye MoccasinBrian S Quigley, MD  amLODipine (NORVASC) 5 MG tablet Take 1 tablet (5 mg total) by mouth daily. 07/13/15   Merwyn Katosavid B Simonds, MD  atenolol (TENORMIN) 50 MG tablet Take 1 tablet (50 mg total) by mouth daily. 07/13/15   Merwyn Katosavid B Simonds, MD  azithromycin (ZITHROMAX) 250 MG tablet Take 1 tablet (250 mg total) by mouth daily. 08/17/15   Altamese DillingVaibhavkumar Vachhani, MD  EPINEPHrine 0.3 mg/0.3 mL IJ SOAJ injection Inject 0.3 mLs (0.3 mg total) into the muscle once. 08/17/15   Altamese DillingVaibhavkumar Vachhani, MD  fluticasone (FLONASE) 50 MCG/ACT nasal spray Place 1 spray into both nostrils 2 (two) times daily.     Historical Provider, MD  Fluticasone-Salmeterol (ADVAIR) 500-50 MCG/DOSE AEPB Inhale 1 puff into the lungs 2 (two) times daily.    Historical Provider, MD  hydrochlorothiazide (HYDRODIURIL) 25 MG tablet Take 1 tablet (25 mg total) by mouth daily. 07/13/15   Merwyn Katosavid B Simonds, MD  ipratropium (ATROVENT) 0.02 % nebulizer solution Take 2.5 mLs (0.5 mg total) by nebulization 4 (four) times daily. 07/13/15   Onalee Huaavid  Ree Kida, MD  levalbuterol (XOPENEX) 0.63 MG/3ML nebulizer solution Take 3 mLs (0.63 mg total) by nebulization every 3 (three) hours as needed for wheezing or shortness of breath. 07/13/15   Merwyn Katos, MD  loratadine (CLARITIN) 10 MG tablet Take 10 mg by mouth daily.    Historical Provider, MD  mometasone-formoterol (DULERA) 200-5 MCG/ACT AERO Inhale 2 puffs into the lungs 2 (two) times daily. 10/28/15   Emily Filbert, MD  montelukast  (SINGULAIR) 10 MG tablet Take 1 tablet (10 mg total) by mouth at bedtime. 07/13/15   Merwyn Katos, MD  nitroGLYCERIN (NITROSTAT) 0.4 MG SL tablet Place 0.4 mg under the tongue every 5 (five) minutes as needed for chest pain.    Historical Provider, MD  ondansetron (ZOFRAN) 4 MG/2ML SOLN injection Inject 2 mLs (4 mg total) into the vein every 6 (six) hours as needed for nausea. 07/13/15   Merwyn Katos, MD  roflumilast (DALIRESP) 500 MCG TABS tablet Take 1 tablet (500 mcg total) by mouth daily. 07/13/15   Merwyn Katos, MD  tiotropium (SPIRIVA) 18 MCG inhalation capsule Place 18 mcg into inhaler and inhale daily.    Historical Provider, MD    Allergies Symbicort [budesonide-formoterol fumarate] and Formoterol fumarate  Family History  Problem Relation Age of Onset  . Cancer Mother   . Hypertension Mother   . Diabetes Mother   . Asthma Son     Social History Social History  Substance Use Topics  . Smoking status: Former Smoker    Packs/day: 2.00    Years: 35.00    Types: Cigarettes  . Smokeless tobacco: Never Used  . Alcohol use 0.0 oz/week     Comment: ocassional beer drinker    Review of Systems Constitutional: No fever/chills Eyes: No visual changes. ENT: No sore throat. Cardiovascular: Denies chest pain. Respiratory: Positive for dyspnea Gastrointestinal: No abdominal pain.  No nausea, no vomiting.  No diarrhea.  No constipation. Genitourinary: Negative for dysuria. Musculoskeletal: Negative for back pain. Skin: Negative for rash. Neurological: Negative for headaches, focal weakness or numbness. Psychiatry: Positive for anxiety 10-point ROS otherwise negative.  ____________________________________________   PHYSICAL EXAM:  VITAL SIGNS: ED Triage Vitals  Enc Vitals Group     BP 11/30/15 0728 (!) 103/56     Pulse Rate 11/30/15 0625 100     Resp 11/30/15 0625 (!) 30     Temp 11/30/15 0625 97.5 F (36.4 C)     Temp Source 11/30/15 0625 Oral     SpO2  11/30/15 0625 100 %     Weight 11/30/15 0628 180 lb (81.6 kg)     Height 11/30/15 0628 5\' 2"  (1.575 m)     Head Circumference --      Peak Flow --      Pain Score 11/30/15 0732 0     Pain Loc --      Pain Edu? --      Excl. in GC? --     Constitutional: Alert and oriented. Well appearing and in no acute distress. Eyes: Conjunctivae are normal. PERRL. EOMI. Head: Atraumatic. Mouth/Throat: Mucous membranes are moist.  Oropharynx non-erythematous. Neck: No stridor.  No meningeal signs.   Cardiovascular: Normal rate, regular rhythm. Good peripheral circulation. Grossly normal heart sounds. Respiratory: Tachypnea, accessory respiratory muscle use diffuse rhonchi. Gastrointestinal: Soft and nontender. No distention.  Musculoskeletal: No lower extremity tenderness nor edema. No gross deformities of extremities. Neurologic:  Normal speech and language. No gross focal neurologic deficits are  appreciated.  Skin:  Skin is warm, dry and intact. No rash noted. Psychiatric: Mood and affect are normal. Speech and behavior are normal.  ____________________________________________   LABS (all labs ordered are listed, but only abnormal results are displayed)  Labs Reviewed  BLOOD GAS, VENOUS - Abnormal; Notable for the following:       Result Value   pCO2, Ven 76 (*)    pO2, Ven <31.0 (*)    Bicarbonate 40.1 (*)    Acid-Base Excess 10.9 (*)    All other components within normal limits   ____________________________________________  EKG  ED ECG REPORT I, [place] N BROWN, the attending physician, personally viewed and interpreted this ECG.   Date: 11/30/2015  EKG Time: 6:24 AM  Rate: 105  Rhythm: Sinus tachycardia  Axis: Normal  Intervals: Normal  ST&T Change: None  ______________  Procedures     INITIAL IMPRESSION / ASSESSMENT AND PLAN / ED COURSE  Pertinent labs & imaging results that were available during my care of the patient were reviewed by me and considered in my  medical decision making (see chart for details).  I spoke with Ms. Mossa at length and advised her of the potential grave consequences that can result from her decision to leave AGAINST MEDICAL ADVICE including death. However she remained adamant that she wanted to leave. I literally pleaded with Miss Prete to postpone leaving until she received a breathing treatment. she very reluctantly agreed to do so. I returned to her room while she was receiving treatment and asked her to please stay for further evaluation and management but she refused to do so.  Clinical Course    ____________________________________________  FINAL CLINICAL IMPRESSION(S) / ED DIAGNOSES  Final diagnoses:  Respiratory distress  COPD exacerbation (HCC)     MEDICATIONS GIVEN DURING THIS VISIT:  Medications  ipratropium-albuterol (DUONEB) 0.5-2.5 (3) MG/3ML nebulizer solution 3 mL (3 mLs Nebulization Given 11/30/15 0713)  ipratropium-albuterol (DUONEB) 0.5-2.5 (3) MG/3ML nebulizer solution 3 mL (3 mLs Nebulization Given 11/30/15 0713)     NEW OUTPATIENT MEDICATIONS STARTED DURING THIS VISIT:  New Prescriptions   No medications on file      Note:  This document was prepared using Dragon voice recognition software and may include unintentional dictation errors.    Darci Current, MD 11/30/15 [phone]

## 2015-11-30 NOTE — ED Notes (Signed)
Pt asked again if she wanted to stay for treatment by myself and Dr. Manson PasseyBrown, but pt declined.  Dr. Manson PasseyBrown and myself explained the risks of leaving. Pt informed if her breathing becomes difficult to return to ED immediately. Pt verbalized understanding. Pt does have 2 of her own oxygen tanks with her to use at departure.

## 2015-11-30 NOTE — ED Notes (Signed)
Pt sitting on side of the bed, 4L oxygen via [place] going at this time.  Explained to patient she would be getting a breathing treatment while waiting for her daughter to come and pick her up.  Informed her that the MD would like her to wait room for ride.

## 2015-12-07 ENCOUNTER — Emergency Department: Payer: Medicaid Other

## 2015-12-07 ENCOUNTER — Encounter: Payer: Self-pay | Admitting: Occupational Medicine

## 2015-12-07 ENCOUNTER — Inpatient Hospital Stay
Admission: EM | Admit: 2015-12-07 | Discharge: 2015-12-08 | DRG: 309 | Disposition: A | Discharge status: Home / Self Care | Payer: Medicaid Other | Attending: Internal Medicine | Admitting: Internal Medicine

## 2015-12-07 DIAGNOSIS — I472 Ventricular tachycardia: Principal | ICD-10-CM | POA: Diagnosis present

## 2015-12-07 DIAGNOSIS — Z9851 Tubal ligation status: Secondary | ICD-10-CM

## 2015-12-07 DIAGNOSIS — K219 Gastro-esophageal reflux disease without esophagitis: Secondary | ICD-10-CM | POA: Diagnosis present

## 2015-12-07 DIAGNOSIS — Z7951 Long term (current) use of inhaled steroids: Secondary | ICD-10-CM

## 2015-12-07 DIAGNOSIS — I110 Hypertensive heart disease with heart failure: Secondary | ICD-10-CM | POA: Diagnosis present

## 2015-12-07 DIAGNOSIS — Z888 Allergy status to other drugs, medicaments and biological substances status: Secondary | ICD-10-CM

## 2015-12-07 DIAGNOSIS — J441 Chronic obstructive pulmonary disease with (acute) exacerbation: Secondary | ICD-10-CM | POA: Diagnosis present

## 2015-12-07 DIAGNOSIS — Z833 Family history of diabetes mellitus: Secondary | ICD-10-CM

## 2015-12-07 DIAGNOSIS — Z8249 Family history of ischemic heart disease and other diseases of the circulatory system: Secondary | ICD-10-CM

## 2015-12-07 DIAGNOSIS — J9610 Chronic respiratory failure, unspecified whether with hypoxia or hypercapnia: Secondary | ICD-10-CM | POA: Diagnosis present

## 2015-12-07 DIAGNOSIS — Z87891 Personal history of nicotine dependence: Secondary | ICD-10-CM

## 2015-12-07 DIAGNOSIS — Z79899 Other long term (current) drug therapy: Secondary | ICD-10-CM

## 2015-12-07 DIAGNOSIS — Z825 Family history of asthma and other chronic lower respiratory diseases: Secondary | ICD-10-CM

## 2015-12-07 DIAGNOSIS — I509 Heart failure, unspecified: Secondary | ICD-10-CM | POA: Diagnosis present

## 2015-12-07 DIAGNOSIS — R0603 Acute respiratory distress: Secondary | ICD-10-CM | POA: Diagnosis present

## 2015-12-07 DIAGNOSIS — I4729 Other ventricular tachycardia: Secondary | ICD-10-CM

## 2015-12-07 DIAGNOSIS — Z809 Family history of malignant neoplasm, unspecified: Secondary | ICD-10-CM

## 2015-12-07 LAB — CBC
HCT: 40.7 % (ref 35.0–47.0)
Hemoglobin: 13.6 g/dL (ref 12.0–16.0)
MCH: 32.8 pg (ref 26.0–34.0)
MCHC: 33.5 g/dL (ref 32.0–36.0)
MCV: 98 fL (ref 80.0–100.0)
PLATELETS: 295 10*3/uL (ref 150–440)
RBC: 4.15 MIL/uL (ref 3.80–5.20)
RDW: 13.7 % (ref 11.5–14.5)
WBC: 5.9 10*3/uL (ref 3.6–11.0)

## 2015-12-07 MED ORDER — IPRATROPIUM-ALBUTEROL 0.5-2.5 (3) MG/3ML IN SOLN
3.0000 mL | Freq: Once | RESPIRATORY_TRACT | Status: AC
  Administered 2015-12-07: 3 mL via RESPIRATORY_TRACT
  Filled 2015-12-07: qty 3

## 2015-12-07 MED ORDER — ALBUTEROL SULFATE (2.5 MG/3ML) 0.083% IN NEBU
5.0000 mg | INHALATION_SOLUTION | Freq: Once | RESPIRATORY_TRACT | Status: DC
  Filled 2015-12-07: qty 6

## 2015-12-07 MED ORDER — METHYLPREDNISOLONE SODIUM SUCC 125 MG IJ SOLR
125.0000 mg | Freq: Once | INTRAMUSCULAR | Status: AC
  Administered 2015-12-07: 125 mg via INTRAVENOUS
  Filled 2015-12-07: qty 2

## 2015-12-07 NOTE — ED Provider Notes (Signed)
[facility] Emergency Department Provider Note   ____________________________________________   First MD Initiated Contact with Patient 12/07/15 2314     (approximate)  I have reviewed the triage vital signs and the nursing notes.   HISTORY  Chief Complaint Shortness of Breath (since lunchtime)    HPI Julie Foley is a 60 y.o. female who presents to the ED from home via EMS with a chief complaint of shortness of breath. Patient has a history of COPD, on 4 L continuous oxygen, CHF, anxiety who is well-known to the emergency department who complains of shortness of breath worsening since lunch time. Reports being out of her albuterol nebulizer solution until Monday. Complains of shortness of breath, wheezing, chest tightness. Denies recent fever, chills, abdominal pain, nausea, vomiting, diarrhea. Denies recent travel or trauma. Nothing makes her symptoms better or worse.   Past Medical History:  Diagnosis Date  . Anxiety   . Asthma   . CHF (congestive heart failure) (HCC)   . Chronic respiratory failure (HCC)   . COPD (chronic obstructive pulmonary disease) (HCC)   . Depression   . GERD (gastroesophageal reflux disease)   . HTN (hypertension)     Patient Active Problem List   Diagnosis Date Noted  . Respiratory distress 12/08/2015  . Acute on chronic respiratory failure with hypoxemia (HCC) 08/17/2015  . Acute respiratory failure (HCC) 05/26/2015  . Sepsis (HCC) 02/16/2015  . COPD with acute exacerbation (HCC) 01/25/2015  . Acute respiratory failure with hypoxemia (HCC) 10/07/2014  . Anxiety 10/07/2014  . HTN (hypertension) 09/23/2014  . GERD (gastroesophageal reflux disease) 09/23/2014  . CHF (congestive heart failure) (HCC) 09/23/2014  . COPD exacerbation (HCC) 08/11/2014    Past Surgical History:  Procedure Laterality Date  . TUBAL LIGATION Bilateral     Prior to Admission medications   Medication Sig Start Date End Date Taking?  Authorizing Provider  albuterol (PROVENTIL HFA;VENTOLIN HFA) 108 (90 Base) MCG/ACT inhaler Inhale 2 puffs into the lungs every 4 (four) hours as needed for wheezing or shortness of breath. 08/17/15  Yes Altamese Dilling, MD  albuterol (PROVENTIL) (2.5 MG/3ML) 0.083% nebulizer solution Take 3 mLs (2.5 mg total) by nebulization every 6 (six) hours as needed for wheezing or shortness of breath. 08/17/15  Yes Altamese Dilling, MD  ALPRAZolam Prudy Feeler) 0.5 MG tablet Take 1 tablet (0.5 mg total) by mouth 3 (three) times daily as needed for sleep or anxiety. 11/09/15 11/08/16 Yes Jennye Moccasin, MD  amLODipine (NORVASC) 5 MG tablet Take 1 tablet (5 mg total) by mouth daily. 07/13/15  Yes Merwyn Katos, MD  atenolol (TENORMIN) 50 MG tablet Take 1 tablet (50 mg total) by mouth daily. 07/13/15  Yes Merwyn Katos, MD  EPINEPHrine 0.3 mg/0.3 mL IJ SOAJ injection Inject 0.3 mLs (0.3 mg total) into the muscle once. 08/17/15  Yes Altamese Dilling, MD  fluticasone (FLONASE) 50 MCG/ACT nasal spray Place 1 spray into both nostrils 2 (two) times daily.    Yes Historical Provider, MD  Fluticasone-Salmeterol (ADVAIR) 500-50 MCG/DOSE AEPB Inhale 1 puff into the lungs 2 (two) times daily.   Yes Historical Provider, MD  hydrochlorothiazide (HYDRODIURIL) 25 MG tablet Take 1 tablet (25 mg total) by mouth daily. 07/13/15  Yes Merwyn Katos, MD  ipratropium (ATROVENT) 0.02 % nebulizer solution Take 2.5 mLs (0.5 mg total) by nebulization 4 (four) times daily. 07/13/15  Yes Merwyn Katos, MD  levalbuterol Pauline Aus) 0.63 MG/3ML nebulizer solution Take 3 mLs (0.63 mg total)  by nebulization every 3 (three) hours as needed for wheezing or shortness of breath. 07/13/15  Yes Merwyn Katos, MD  loratadine (CLARITIN) 10 MG tablet Take 10 mg by mouth daily.   Yes Historical Provider, MD  mometasone-formoterol (DULERA) 200-5 MCG/ACT AERO Inhale 2 puffs into the lungs 2 (two) times daily. 10/28/15  Yes Emily Filbert, MD    montelukast (SINGULAIR) 10 MG tablet Take 1 tablet (10 mg total) by mouth at bedtime. 07/13/15  Yes Merwyn Katos, MD  nitroGLYCERIN (NITROSTAT) 0.4 MG SL tablet Place 0.4 mg under the tongue every 5 (five) minutes as needed for chest pain.   Yes Historical Provider, MD  roflumilast (DALIRESP) 500 MCG TABS tablet Take 1 tablet (500 mcg total) by mouth daily. 07/13/15  Yes Merwyn Katos, MD  tiotropium (SPIRIVA) 18 MCG inhalation capsule Place 18 mcg into inhaler and inhale daily.   Yes Historical Provider, MD  azithromycin (ZITHROMAX) 250 MG tablet Take 1 tablet (250 mg total) by mouth daily. Patient not taking: Reported on 12/08/2015 08/17/15   Altamese Dilling, MD  ondansetron [facility]) 4 MG/2ML SOLN injection Inject 2 mLs (4 mg total) into the vein every 6 (six) hours as needed for nausea. 07/13/15   Merwyn Katos, MD    Allergies Symbicort [budesonide-formoterol fumarate] and Formoterol fumarate  Family History  Problem Relation Age of Onset  . Cancer Mother   . Hypertension Mother   . Diabetes Mother   . Asthma Son     Social History Social History  Substance Use Topics  . Smoking status: Former Smoker    Packs/day: 2.00    Years: 35.00    Types: Cigarettes  . Smokeless tobacco: Never Used  . Alcohol use 0.0 oz/week     Comment: ocassional beer drinker    Review of Systems  Constitutional: No fever/chills. Eyes: No visual changes. ENT: No sore throat. Cardiovascular: Positive for chest tightness. Respiratory: Positive for wheezing and shortness of breath. Gastrointestinal: No abdominal pain.  No nausea, no vomiting.  No diarrhea.  No constipation. Genitourinary: Negative for dysuria. Musculoskeletal: Negative for back pain. Skin: Negative for rash. Neurological: Negative for headaches, focal weakness or numbness.  10-point ROS otherwise negative.  ____________________________________________   PHYSICAL EXAM:  VITAL SIGNS: ED Triage Vitals  Enc Vitals  Group     BP 12/07/15 2232 112/63     Pulse Rate 12/07/15 2232 (!) 102     Resp 12/07/15 2232 (!) 24     Temp 12/07/15 2232 98.3 F (36.8 C)     Temp src --      SpO2 12/07/15 2228 94 %     Weight 12/07/15 2233 180 lb 6.4 oz (81.8 kg)     Height 12/07/15 2233 5\' 2"  (1.575 m)     Head Circumference --      Peak Flow --      Pain Score 12/07/15 2233 7     Pain Loc --      Pain Edu? --      Excl. in GC? --     Constitutional: Alert and oriented. Well appearing and in mild acute distress. Eyes: Conjunctivae are normal. PERRL. EOMI. Head: Atraumatic. Nose: No congestion/rhinnorhea. Mouth/Throat: Mucous membranes are moist.  Oropharynx non-erythematous. Neck: No stridor.   Cardiovascular: Normal rate, regular rhythm. Grossly normal heart sounds.  Good peripheral circulation. Respiratory: Increased respiratory effort.  No retractions. Lungs diminished bilaterally. Gastrointestinal: Soft and nontender. No distention. No abdominal bruits. No CVA tenderness. Musculoskeletal:  No lower extremity tenderness nor edema.  No joint effusions. Neurologic:  Normal speech and language. No gross focal neurologic deficits are appreciated. No gait instability. Skin:  Skin is warm, dry and intact. No rash noted. Psychiatric: Mood and affect are normal. Speech and behavior are normal.  ____________________________________________   LABS (all labs ordered are listed, but only abnormal results are displayed)  Labs Reviewed  BASIC METABOLIC PANEL - Abnormal; Notable for the following:       Result Value   CO2 34 (*)    Calcium 8.8 (*)    All other components within normal limits  CBC  TROPONIN I  BRAIN NATRIURETIC PEPTIDE  TROPONIN I  MAGNESIUM   ____________________________________________  EKG  ED ECG REPORT I, SUNG,JADE J, the attending physician, personally viewed and interpreted this ECG.   Date: 12/07/2015  EKG Time: 2234  Rate: 100  Rhythm: sinus tachycardia  Axis: Normal   Intervals:none  ST&T Change: Nonspecific  ____________________________________________  RADIOLOGY  Chest 2 view (viewed by me, interpreted per Dr. Gwenyth Benderadparvar): No active cardiopulmonary disease. ____________________________________________   PROCEDURES  Procedure(s) performed: None  Procedures  Critical Care performed: No  ____________________________________________   INITIAL IMPRESSION / ASSESSMENT AND PLAN / ED COURSE  Pertinent labs & imaging results that were available during my care of the patient were reviewed by me and considered in my medical decision making (see chart for details).  60 year old female with a history of COPD on continuous oxygen who presents with increased shortness of breath in the setting of running out of albuterol solution. Will initiate nebulizer treatment, Solu-Medrol and reassess.  Clinical Course  Comment By Time  Sleeping in no acute distress. Will continue to observe in the emergency department. Irean HongJade J Sung, MD 09/09 0101  Sleeping in no acute distress. Lungs clear to auscultation. Patient is not tachypneic. Her albuterol nebulizer solution does not get delivered to her until Monday. I spoke with nursing supervisor and we are able to give patient 4 Nebules for home use. Strict return precautions given. Patient verbalizes understanding and agrees with plan of care. Irean HongJade J Sung, MD 09/09 0202  Patient unable to find a ride home. She will remain in the emergency department and continue to call for a ride. Irean HongJade J Sung, MD 09/09 [phone]  Patient noted to have a 9 beat run of nonsustained ventricular tachycardia. She was resting at the time. Denies active chest pain. Will repeat troponin. Irean HongJade J Sung, MD 09/09 [phone]  Patient has been experiencing muscle cramps in her hands and legs. Will check magnesium level. Will administer Ativan to help with cramping. Irean HongJade J Sung, MD 09/09 [phone]     ____________________________________________   FINAL CLINICAL  IMPRESSION(S) / ED DIAGNOSES  Final diagnoses:  COPD exacerbation (HCC)  Ventricular tachycardia, nonsustained (HCC)      NEW MEDICATIONS STARTED DURING THIS VISIT:  New Prescriptions   No medications on file     Note:  This document was prepared using Dragon voice recognition software and may include unintentional dictation errors.    Irean HongJade J Sung, MD 12/08/15 [phone]

## 2015-12-07 NOTE — ED Triage Notes (Signed)
Pt presents via ems from home with SOB worse since lunch. Pt has hx of COPD. Pt out of SVN tx til Monday. Pt on 4 L [place] daily. Pt has chest pain central pain 7/10 non radiating. Wheezing and diminished lung sounds. Trace edema to legs.

## 2015-12-07 NOTE — ED Notes (Signed)
Attempted IV unsuccessful myself charge nurse attempting.

## 2015-12-08 DIAGNOSIS — Z833 Family history of diabetes mellitus: Secondary | ICD-10-CM | POA: Diagnosis not present

## 2015-12-08 DIAGNOSIS — I509 Heart failure, unspecified: Secondary | ICD-10-CM | POA: Diagnosis present

## 2015-12-08 DIAGNOSIS — Z8249 Family history of ischemic heart disease and other diseases of the circulatory system: Secondary | ICD-10-CM | POA: Diagnosis not present

## 2015-12-08 DIAGNOSIS — Z87891 Personal history of nicotine dependence: Secondary | ICD-10-CM | POA: Diagnosis not present

## 2015-12-08 DIAGNOSIS — R0603 Acute respiratory distress: Secondary | ICD-10-CM | POA: Diagnosis present

## 2015-12-08 DIAGNOSIS — Z825 Family history of asthma and other chronic lower respiratory diseases: Secondary | ICD-10-CM | POA: Diagnosis not present

## 2015-12-08 DIAGNOSIS — J441 Chronic obstructive pulmonary disease with (acute) exacerbation: Secondary | ICD-10-CM | POA: Diagnosis not present

## 2015-12-08 DIAGNOSIS — Z888 Allergy status to other drugs, medicaments and biological substances status: Secondary | ICD-10-CM | POA: Diagnosis not present

## 2015-12-08 DIAGNOSIS — K219 Gastro-esophageal reflux disease without esophagitis: Secondary | ICD-10-CM | POA: Diagnosis present

## 2015-12-08 DIAGNOSIS — Z9851 Tubal ligation status: Secondary | ICD-10-CM | POA: Diagnosis not present

## 2015-12-08 DIAGNOSIS — Z7951 Long term (current) use of inhaled steroids: Secondary | ICD-10-CM | POA: Diagnosis not present

## 2015-12-08 DIAGNOSIS — Z79899 Other long term (current) drug therapy: Secondary | ICD-10-CM | POA: Diagnosis not present

## 2015-12-08 DIAGNOSIS — I110 Hypertensive heart disease with heart failure: Secondary | ICD-10-CM | POA: Diagnosis present

## 2015-12-08 DIAGNOSIS — I472 Ventricular tachycardia: Secondary | ICD-10-CM | POA: Diagnosis not present

## 2015-12-08 DIAGNOSIS — Z809 Family history of malignant neoplasm, unspecified: Secondary | ICD-10-CM | POA: Diagnosis not present

## 2015-12-08 DIAGNOSIS — J9610 Chronic respiratory failure, unspecified whether with hypoxia or hypercapnia: Secondary | ICD-10-CM | POA: Diagnosis present

## 2015-12-08 LAB — HEMOGLOBIN A1C: Hgb A1c MFr Bld: 5.8 % (ref 4.0–6.0)

## 2015-12-08 LAB — BASIC METABOLIC PANEL
Anion gap: 7 (ref 5–15)
BUN: 13 mg/dL (ref 6–20)
CALCIUM: 8.8 mg/dL — AB (ref 8.9–10.3)
CO2: 34 mmol/L — AB (ref 22–32)
Chloride: 102 mmol/L (ref 101–111)
Creatinine, Ser: 0.75 mg/dL (ref 0.44–1.00)
GLUCOSE: 93 mg/dL (ref 65–99)
Potassium: 4.2 mmol/L (ref 3.5–5.1)
Sodium: 143 mmol/L (ref 135–145)

## 2015-12-08 LAB — MAGNESIUM: Magnesium: 2.1 mg/dL (ref 1.7–2.4)

## 2015-12-08 LAB — BRAIN NATRIURETIC PEPTIDE: B NATRIURETIC PEPTIDE 5: 21 pg/mL (ref 0.0–100.0)

## 2015-12-08 LAB — TROPONIN I
Troponin I: <0.03 ng/mL (ref <0.03)
Troponin I: <0.03 ng/mL (ref <0.03)

## 2015-12-08 LAB — TSH: TSH: 0.193 u[IU]/mL — ABNORMAL LOW (ref 0.350–4.500)

## 2015-12-08 MED ORDER — LORAZEPAM 2 MG/ML IJ SOLN
1.0000 mg | Freq: Once | INTRAMUSCULAR | Status: AC
  Administered 2015-12-08: 1 mg via INTRAVENOUS

## 2015-12-08 MED ORDER — IPRATROPIUM BROMIDE 0.02 % IN SOLN: 0.5000 mg | Freq: Four times a day (QID) | RESPIRATORY_TRACT | Status: DC

## 2015-12-08 MED ORDER — AZITHROMYCIN 250 MG PO TABS
500.0000 mg | ORAL_TABLET | Freq: Every day | ORAL | Status: AC
  Administered 2015-12-08: 500 mg via ORAL
  Filled 2015-12-08: qty 2

## 2015-12-08 MED ORDER — IPRATROPIUM-ALBUTEROL 0.5-2.5 (3) MG/3ML IN SOLN
3.0000 mL | RESPIRATORY_TRACT | Status: DC
  Administered 2015-12-08: 3 mL via RESPIRATORY_TRACT
  Filled 2015-12-08: qty 3

## 2015-12-08 MED ORDER — ROFLUMILAST 500 MCG PO TABS
500.0000 ug | ORAL_TABLET | Freq: Every day | ORAL | Status: DC
  Administered 2015-12-08: 500 ug via ORAL
  Filled 2015-12-08: qty 1

## 2015-12-08 MED ORDER — SODIUM CHLORIDE 0.9% FLUSH: 3.0000 mL | Freq: Two times a day (BID) | INTRAVENOUS | Status: DC

## 2015-12-08 MED ORDER — AZITHROMYCIN 250 MG PO TABS: 250.0000 mg | ORAL_TABLET | Freq: Every day | ORAL | Status: DC

## 2015-12-08 MED ORDER — ONDANSETRON HCL 4 MG PO TABS: 4.0000 mg | ORAL_TABLET | Freq: Four times a day (QID) | ORAL | Status: DC | PRN

## 2015-12-08 MED ORDER — PREDNISONE 10 MG (21) PO TBPK: 10.0000 mg | ORAL_TABLET | Freq: Every day | ORAL | 0 refills | Status: DC

## 2015-12-08 MED ORDER — DIAZEPAM 5 MG/ML IJ SOLN
2.0000 mg | Freq: Once | INTRAMUSCULAR | Status: AC
  Administered 2015-12-08: 2 mg via INTRAVENOUS

## 2015-12-08 MED ORDER — IPRATROPIUM-ALBUTEROL 0.5-2.5 (3) MG/3ML IN SOLN: 3.0000 mL | Freq: Four times a day (QID) | RESPIRATORY_TRACT | Status: DC

## 2015-12-08 MED ORDER — EPINEPHRINE 0.3 MG/0.3ML IJ SOAJ
0.3000 mg | Freq: Once | INTRAMUSCULAR | Status: DC | PRN
  Filled 2015-12-08: qty 0.3

## 2015-12-08 MED ORDER — ONDANSETRON HCL 4 MG/2ML IJ SOLN: 4.0000 mg | Freq: Four times a day (QID) | INTRAMUSCULAR | Status: DC | PRN

## 2015-12-08 MED ORDER — SODIUM CHLORIDE 0.9 % IV SOLN: INTRAVENOUS | Status: DC

## 2015-12-08 MED ORDER — ALBUTEROL SULFATE (2.5 MG/3ML) 0.083% IN NEBU: 2.5000 mg | INHALATION_SOLUTION | Freq: Once | RESPIRATORY_TRACT | Status: DC

## 2015-12-08 MED ORDER — ACETAMINOPHEN 650 MG RE SUPP: 650.0000 mg | Freq: Four times a day (QID) | RECTAL | Status: DC | PRN

## 2015-12-08 MED ORDER — ALBUTEROL SULFATE (2.5 MG/3ML) 0.083% IN NEBU: 2.5000 mg | INHALATION_SOLUTION | RESPIRATORY_TRACT | Status: DC

## 2015-12-08 MED ORDER — ATENOLOL 25 MG PO TABS
50.0000 mg | ORAL_TABLET | Freq: Every day | ORAL | Status: DC
  Administered 2015-12-08: 50 mg via ORAL
  Filled 2015-12-08: qty 2

## 2015-12-08 MED ORDER — ALBUTEROL SULFATE (2.5 MG/3ML) 0.083% IN NEBU: 2.5000 mg | INHALATION_SOLUTION | Freq: Four times a day (QID) | RESPIRATORY_TRACT | 2 refills | Status: DC | PRN

## 2015-12-08 MED ORDER — DOCUSATE SODIUM 100 MG PO CAPS
100.0000 mg | ORAL_CAPSULE | Freq: Two times a day (BID) | ORAL | Status: DC
  Administered 2015-12-08: 100 mg via ORAL
  Filled 2015-12-08: qty 1

## 2015-12-08 MED ORDER — MONTELUKAST SODIUM 10 MG PO TABS: 10.0000 mg | ORAL_TABLET | Freq: Every day | ORAL | Status: DC

## 2015-12-08 MED ORDER — MORPHINE SULFATE (PF) 2 MG/ML IV SOLN
INTRAVENOUS | Status: AC
  Administered 2015-12-08: 1 mg via INTRAVENOUS
  Filled 2015-12-08: qty 1

## 2015-12-08 MED ORDER — MORPHINE SULFATE (PF) 2 MG/ML IV SOLN
1.0000 mg | INTRAVENOUS | Status: DC | PRN
Start: 2015-12-08 — End: 2015-12-08
  Administered 2015-12-08: 1 mg via INTRAVENOUS

## 2015-12-08 MED ORDER — ACETAMINOPHEN 325 MG PO TABS: 650.0000 mg | ORAL_TABLET | Freq: Four times a day (QID) | ORAL | Status: DC | PRN

## 2015-12-08 MED ORDER — NITROGLYCERIN 0.4 MG SL SUBL: 0.4000 mg | SUBLINGUAL_TABLET | SUBLINGUAL | Status: DC | PRN

## 2015-12-08 MED ORDER — LORATADINE 10 MG PO TABS
10.0000 mg | ORAL_TABLET | Freq: Every day | ORAL | Status: DC
  Administered 2015-12-08: 10 mg via ORAL
  Filled 2015-12-08: qty 1

## 2015-12-08 MED ORDER — PREDNISONE 20 MG PO TABS: 30.0000 mg | ORAL_TABLET | Freq: Every day | ORAL | Status: DC

## 2015-12-08 MED ORDER — MOMETASONE FURO-FORMOTEROL FUM 200-5 MCG/ACT IN AERO: 2.0000 | INHALATION_SPRAY | Freq: Two times a day (BID) | RESPIRATORY_TRACT | Status: DC

## 2015-12-08 MED ORDER — ALPRAZOLAM 0.5 MG PO TABS: 0.5000 mg | ORAL_TABLET | Freq: Three times a day (TID) | ORAL | Status: DC | PRN

## 2015-12-08 MED ORDER — PREDNISONE 50 MG PO TABS
50.0000 mg | ORAL_TABLET | Freq: Every day | ORAL | Status: AC
  Administered 2015-12-08: 50 mg via ORAL
  Filled 2015-12-08: qty 1

## 2015-12-08 MED ORDER — PREDNISONE 20 MG PO TABS: 20.0000 mg | ORAL_TABLET | Freq: Every day | ORAL | Status: DC

## 2015-12-08 MED ORDER — HYDROCHLOROTHIAZIDE 25 MG PO TABS
25.0000 mg | ORAL_TABLET | Freq: Every day | ORAL | Status: DC
  Administered 2015-12-08: 25 mg via ORAL
  Filled 2015-12-08: qty 1

## 2015-12-08 MED ORDER — IPRATROPIUM BROMIDE 0.02 % IN SOLN: 0.5000 mg | Freq: Four times a day (QID) | RESPIRATORY_TRACT | 12 refills | Status: AC

## 2015-12-08 MED ORDER — ENOXAPARIN SODIUM 40 MG/0.4ML ~~LOC~~ SOLN
40.0000 mg | SUBCUTANEOUS | Status: DC
  Filled 2015-12-08: qty 0.4

## 2015-12-08 MED ORDER — LORAZEPAM 2 MG/ML IJ SOLN
INTRAMUSCULAR | Status: AC
Start: 2015-12-08 — End: 2015-12-08
  Administered 2015-12-08: 1 mg via INTRAVENOUS
  Filled 2015-12-08: qty 1

## 2015-12-08 MED ORDER — AMLODIPINE BESYLATE 5 MG PO TABS
5.0000 mg | ORAL_TABLET | Freq: Every day | ORAL | Status: DC
  Administered 2015-12-08: 5 mg via ORAL
  Filled 2015-12-08: qty 1

## 2015-12-08 MED ORDER — PREDNISONE 10 MG PO TABS: 10.0000 mg | ORAL_TABLET | Freq: Every day | ORAL | Status: DC

## 2015-12-08 MED ORDER — MOMETASONE FURO-FORMOTEROL FUM 200-5 MCG/ACT IN AERO
2.0000 | INHALATION_SPRAY | Freq: Two times a day (BID) | RESPIRATORY_TRACT | Status: DC
  Administered 2015-12-08: 2 via RESPIRATORY_TRACT
  Filled 2015-12-08: qty 8.8

## 2015-12-08 MED ORDER — DIAZEPAM 5 MG/ML IJ SOLN
INTRAMUSCULAR | Status: AC
  Administered 2015-12-08: 2 mg via INTRAVENOUS
  Filled 2015-12-08: qty 2

## 2015-12-08 MED ORDER — ALBUTEROL SULFATE (2.5 MG/3ML) 0.083% IN NEBU
INHALATION_SOLUTION | RESPIRATORY_TRACT | Status: AC
  Filled 2015-12-08: qty 12

## 2015-12-08 MED ORDER — PREDNISONE 20 MG PO TABS: 40.0000 mg | ORAL_TABLET | Freq: Every day | ORAL | Status: DC

## 2015-12-08 MED ORDER — FLUTICASONE PROPIONATE 50 MCG/ACT NA SUSP
1.0000 | Freq: Two times a day (BID) | NASAL | Status: DC
  Administered 2015-12-08: 1 via NASAL
  Filled 2015-12-08: qty 16

## 2015-12-08 NOTE — ED Notes (Signed)
Ativan didn't help spasms. Dr. Sheryle Hailiamond made aware ordering morphine.

## 2015-12-08 NOTE — H&P (Addendum)
Julie Foley is an 60 y.o. female.   Chief Complaint: Shortness of breath HPI: The patient with COPD with frequent exacerbation who is well-known to the hospitalist service presents to the emergency department complaining of shortness of breath. The patient states that she began to feel short of breath and weak earlier today. She ran out of her inhalers and called EMS earlier in the day for help. She states that the initial breathing treatment made her feel better but she progressively worsened throughout the day which prompted another call to EMS and subsequently transport to the hospital. While here the patient received 6 breathing treatments as well as Solu-Medrol. This improved her air movement. Oxygen saturations were 88-92% on her home dose of 4 L/m of oxygen via nasal cannula. The patient was being prepared for discharge when 9 beats of nonsustained V. tach were noticed on telemetry. The patient denies chest pain but reports that she was able to feel the palpitations during this brief episode of tachycardia. Concern for repeat arrhythmia and general anxiety prompted the emergency department staff as well as the patient's request admission for further evaluation.  Past Medical History:  Diagnosis Date  . Anxiety   . Asthma   . CHF (congestive heart failure) ([place])   . Chronic respiratory failure ([place])   . COPD (chronic obstructive pulmonary disease) ([place])   . Depression   . GERD (gastroesophageal reflux disease)   . HTN (hypertension)     Past Surgical History:  Procedure Laterality Date  . TUBAL LIGATION Bilateral     Family History  Problem Relation Age of Onset  . Cancer Mother   . Hypertension Mother   . Diabetes Mother   . Asthma Son    Social History:  reports that she has quit smoking. Her smoking use included Cigarettes. She has a 70.00 pack-year smoking history. She has never used smokeless tobacco. She reports that she drinks alcohol. She reports that she does not use  drugs.  Allergies:  Allergies  Allergen Reactions  . Symbicort [Budesonide-Formoterol Fumarate] Swelling and Other (See Comments)    Reaction:  Tongue swelling   . Formoterol Fumarate Swelling and Other (See Comments)    Reaction:  Tongue swelling    Prior to Admission medications   Medication Sig Start Date End Date Taking? Authorizing Provider  albuterol (PROVENTIL HFA;VENTOLIN HFA) 108 (90 Base) MCG/ACT inhaler Inhale 2 puffs into the lungs every 4 (four) hours as needed for wheezing or shortness of breath. 08/17/15  Yes Vaughan Basta, MD  albuterol (PROVENTIL) (2.5 MG/3ML) 0.083% nebulizer solution Take 3 mLs (2.5 mg total) by nebulization every 6 (six) hours as needed for wheezing or shortness of breath. 08/17/15  Yes Vaughan Basta, MD  ALPRAZolam Duanne Moron) 0.5 MG tablet Take 1 tablet (0.5 mg total) by mouth 3 (three) times daily as needed for sleep or anxiety. 11/09/15 11/08/16 Yes Daymon Larsen, MD  amLODipine (NORVASC) 5 MG tablet Take 1 tablet (5 mg total) by mouth daily. 07/13/15  Yes Wilhelmina Mcardle, MD  atenolol (TENORMIN) 50 MG tablet Take 1 tablet (50 mg total) by mouth daily. 07/13/15  Yes Wilhelmina Mcardle, MD  EPINEPHrine 0.3 mg/0.3 mL IJ SOAJ injection Inject 0.3 mLs (0.3 mg total) into the muscle once. 08/17/15  Yes Vaughan Basta, MD  fluticasone (FLONASE) 50 MCG/ACT nasal spray Place 1 spray into both nostrils 2 (two) times daily.    Yes Historical Provider, MD  Fluticasone-Salmeterol (ADVAIR) 500-50 MCG/DOSE AEPB Inhale 1 puff into  the lungs 2 (two) times daily.   Yes Historical Provider, MD  hydrochlorothiazide (HYDRODIURIL) 25 MG tablet Take 1 tablet (25 mg total) by mouth daily. 07/13/15  Yes Wilhelmina Mcardle, MD  ipratropium (ATROVENT) 0.02 % nebulizer solution Take 2.5 mLs (0.5 mg total) by nebulization 4 (four) times daily. 07/13/15  Yes Wilhelmina Mcardle, MD  levalbuterol Penne Lash) 0.63 MG/3ML nebulizer solution Take 3 mLs (0.63 mg total) by nebulization  every 3 (three) hours as needed for wheezing or shortness of breath. 07/13/15  Yes Wilhelmina Mcardle, MD  loratadine (CLARITIN) 10 MG tablet Take 10 mg by mouth daily.   Yes Historical Provider, MD  mometasone-formoterol (DULERA) 200-5 MCG/ACT AERO Inhale 2 puffs into the lungs 2 (two) times daily. 10/28/15  Yes Earleen Newport, MD  montelukast (SINGULAIR) 10 MG tablet Take 1 tablet (10 mg total) by mouth at bedtime. 07/13/15  Yes Wilhelmina Mcardle, MD  nitroGLYCERIN (NITROSTAT) 0.4 MG SL tablet Place 0.4 mg under the tongue every 5 (five) minutes as needed for chest pain.   Yes Historical Provider, MD  roflumilast (DALIRESP) 500 MCG TABS tablet Take 1 tablet (500 mcg total) by mouth daily. 07/13/15  Yes Wilhelmina Mcardle, MD  tiotropium (SPIRIVA) 18 MCG inhalation capsule Place 18 mcg into inhaler and inhale daily.   Yes Historical Provider, MD  azithromycin (ZITHROMAX) 250 MG tablet Take 1 tablet (250 mg total) by mouth daily. Patient not taking: Reported on 12/08/2015 08/17/15   Vaughan Basta, MD  ondansetron [facility]) 4 MG/2ML SOLN injection Inject 2 mLs (4 mg total) into the vein every 6 (six) hours as needed for nausea. 07/13/15   Wilhelmina Mcardle, MD     Results for orders placed or performed during the hospital encounter of 12/07/15 (from the past 48 hour(s))  Basic metabolic panel     Status: Abnormal   Collection Time: 12/07/15 11:35 PM  Result Value Ref Range   Sodium 143 135 - 145 mmol/L   Potassium 4.2 3.5 - 5.1 mmol/L   Chloride 102 101 - 111 mmol/L   CO2 34 (H) 22 - 32 mmol/L   Glucose, Bld 93 65 - 99 mg/dL   BUN 13 6 - 20 mg/dL   Creatinine, Ser 0.75 0.44 - 1.00 mg/dL   Calcium 8.8 (L) 8.9 - 10.3 mg/dL   GFR calc non Af Amer >60 >60 mL/min   GFR calc Af Amer >60 >60 mL/min    Comment: (NOTE) The eGFR has been calculated using the CKD EPI equation. This calculation has not been validated in all clinical situations. eGFR's persistently <60 mL/min signify possible Chronic  Kidney Disease.    Anion gap 7 5 - 15  CBC     Status: None   Collection Time: 12/07/15 11:35 PM  Result Value Ref Range   WBC 5.9 3.6 - 11.0 K/uL   RBC 4.15 3.80 - 5.20 MIL/uL   Hemoglobin 13.6 12.0 - 16.0 g/dL   HCT 40.7 35.0 - 47.0 %   MCV 98.0 80.0 - 100.0 fL   MCH 32.8 26.0 - 34.0 pg   MCHC 33.5 32.0 - 36.0 g/dL   RDW 13.7 11.5 - 14.5 %   Platelets 295 150 - 440 K/uL  Troponin I     Status: None   Collection Time: 12/07/15 11:35 PM  Result Value Ref Range   Troponin I <0.03 <0.03 ng/mL  Brain natriuretic peptide     Status: None   Collection Time: 12/07/15 11:35 PM  Result Value Ref Range   B Natriuretic Peptide 21.0 0.0 - 100.0 pg/mL  Troponin I     Status: None   Collection Time: 12/08/15  3:44 AM  Result Value Ref Range   Troponin I <0.03 <0.03 ng/mL  Magnesium     Status: None   Collection Time: 12/08/15  3:44 AM  Result Value Ref Range   Magnesium 2.1 1.7 - 2.4 mg/dL   Dg Chest 2 View  Result Date: 12/07/2015 CLINICAL DATA:  60 year old female with shortness of breath EXAM: CHEST  2 VIEW COMPARISON:  Chest radiograph dated 11/09/2015 FINDINGS: The heart size and mediastinal contours are within normal limits. Both lungs are clear. No acute osseous pathology. IMPRESSION: No active cardiopulmonary disease. Electronically Signed   By: Anner Crete M.D.   On: 12/07/2015 23:31    Review of Systems  Constitutional: Negative for chills and fever.  HENT: Negative for sore throat and tinnitus.   Eyes: Negative for blurred vision and redness.  Respiratory: Positive for shortness of breath. Negative for cough.   Cardiovascular: Negative for chest pain, palpitations, orthopnea and PND.  Gastrointestinal: Negative for abdominal pain, diarrhea, nausea and vomiting.  Genitourinary: Negative for dysuria, frequency and urgency.  Musculoskeletal: Negative for joint pain and myalgias.  Skin: Negative for rash.       No lesions  Neurological: Negative for speech change, focal  weakness and weakness.  Endo/Heme/Allergies: Does not bruise/bleed easily.       No temperature intolerance  Psychiatric/Behavioral: Negative for depression and suicidal ideas.    Blood pressure 115/70, pulse 85, temperature 98.2 F (36.8 C), temperature source Oral, resp. rate 16, height 5' 2"  (1.575 m), weight 81.8 kg (180 lb 6.4 oz), SpO2 96 %. Physical Exam  Vitals reviewed. Constitutional: She is oriented to person, place, and time. She appears well-developed and well-nourished. No distress.  HENT:  Head: Normocephalic and atraumatic.  Mouth/Throat: Oropharynx is clear and moist.  Eyes: Conjunctivae and EOM are normal. Pupils are equal, round, and reactive to light. No scleral icterus.  Neck: Normal range of motion. No JVD present. No tracheal deviation present. No thyromegaly present.  Cardiovascular: Normal rate, regular rhythm and normal heart sounds.  Exam reveals no gallop and no friction rub.   No murmur heard. Respiratory: Effort normal. She has wheezes.  Moderate air movement  GI: Soft. Bowel sounds are normal. She exhibits no distension. There is no tenderness.  Genitourinary:  Genitourinary Comments: Deferred  Musculoskeletal: Normal range of motion. She exhibits no edema.  Lymphadenopathy:    She has no cervical adenopathy.  Neurological: She is alert and oriented to person, place, and time. No cranial nerve deficit. She exhibits normal muscle tone.  Skin: Skin is warm and dry. No rash noted. No erythema.  Psychiatric: She has a normal mood and affect. Her behavior is normal. Judgment and thought content normal.     Assessment/Plan This is a 60 year old female admitted for respiratory distress and arrhythmias. 1. Respiratory distress: Secondary to COPD exacerbation. I started the patient on a steroid taper and prescribed azithromycin for anti-inflammatory effect. Scheduled albuterol as well as ipratropium. Restart Spiriva when the patient no longer needs frequent  nebulizer treatments. Continue inhaled corticosteroid and Daliresp. Alprazolam and morphine for anxiety and respiratory distress. 2. Ventricular tachycardia: Nonsustained; secondary to multiple beta agonist treatment. Continue to monitor telemetry. Cardiology consult at discretion of the primary team. 3. Hypertension: Controlled; continue amlodipine, hydrochlorothiazide and atenolol 4. DVT prophylaxis: Lovenox 5. GI prophylaxis: None The  patient is a full code. Time spent on admission was inpatient care approximately 45 minutes  Harrie Foreman, MD 12/08/2015, 6:23 AM
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
## 2015-12-08 NOTE — ED Notes (Signed)
Attempted to call daughter for a ride home no answer. Pt is sleeping in bed no distress noted.

## 2015-12-08 NOTE — Discharge Instructions (Signed)
We are able to provide you with 4 albuterol nebulizer solution packets for home use. Return to the ER for worsening symptoms, persistent vomiting, difficulty breathing or other concerns.

## 2015-12-08 NOTE — ED Notes (Signed)
Pt is lying down resting and back into NSR. 90-98

## 2015-12-08 NOTE — ED Notes (Signed)
Pt tried calling daughter to pick up got voicemail. MD made aware. Pt given sandwich tray. Pt at discharge to be given Albuterol 2.5mg /763ml x4 to take home to use per Dr. Dolores FrameSung because pt unable to get medication filled til Monday. Architectural technologistCharge nurse House supervisor and pharmacy aware of this.

## 2015-12-08 NOTE — ED Notes (Signed)
Valium didn't helped spasms to leg and hands MD ordering ativan.

## 2015-12-08 NOTE — ED Notes (Signed)
MD made aware of v-tacy

## 2015-12-08 NOTE — ED Notes (Signed)
Pt is sleeping at this time.

## 2015-12-08 NOTE — ED Notes (Signed)
Pt having spasms to left hand and left foot. MD at the bedside requesting pt to stay.

## 2015-12-08 NOTE — Progress Notes (Signed)
Pt to be discharged today. Iv and tele removed. disch instructions and prescrips given to pt to her understanding. disch via w.c. Accompanied by her daughter. 

## 2015-12-27 ENCOUNTER — Emergency Department
Admission: EM | Admit: 2015-12-27 | Discharge: 2015-12-27 | Disposition: A | Discharge status: Home / Self Care | Payer: Medicaid Other | Attending: Emergency Medicine | Admitting: Emergency Medicine

## 2015-12-27 ENCOUNTER — Encounter: Payer: Self-pay | Admitting: Emergency Medicine

## 2015-12-27 ENCOUNTER — Emergency Department: Payer: Medicaid Other

## 2015-12-27 DIAGNOSIS — Z87891 Personal history of nicotine dependence: Secondary | ICD-10-CM | POA: Insufficient documentation

## 2015-12-27 DIAGNOSIS — Z79899 Other long term (current) drug therapy: Secondary | ICD-10-CM | POA: Insufficient documentation

## 2015-12-27 DIAGNOSIS — J441 Chronic obstructive pulmonary disease with (acute) exacerbation: Secondary | ICD-10-CM

## 2015-12-27 DIAGNOSIS — R0603 Acute respiratory distress: Secondary | ICD-10-CM

## 2015-12-27 DIAGNOSIS — I110 Hypertensive heart disease with heart failure: Secondary | ICD-10-CM | POA: Insufficient documentation

## 2015-12-27 DIAGNOSIS — R0602 Shortness of breath: Secondary | ICD-10-CM | POA: Diagnosis present

## 2015-12-27 DIAGNOSIS — J45909 Unspecified asthma, uncomplicated: Secondary | ICD-10-CM | POA: Diagnosis not present

## 2015-12-27 DIAGNOSIS — I509 Heart failure, unspecified: Secondary | ICD-10-CM | POA: Diagnosis not present

## 2015-12-27 LAB — CBC WITH DIFFERENTIAL/PLATELET
BASOS PCT: 1 %
Basophils Absolute: 0.1 10*3/uL (ref 0–0.1)
EOS ABS: 1.4 10*3/uL — AB (ref 0–0.7)
Eosinophils Relative: 16 %
HCT: 39.6 % (ref 35.0–47.0)
HEMOGLOBIN: 13.6 g/dL (ref 12.0–16.0)
Lymphocytes Relative: 25 %
Lymphs Abs: 2.2 10*3/uL (ref 1.0–3.6)
MCH: 33.5 pg (ref 26.0–34.0)
MCHC: 34.4 g/dL (ref 32.0–36.0)
MCV: 97.4 fL (ref 80.0–100.0)
Monocytes Absolute: 0.5 10*3/uL (ref 0.2–0.9)
Monocytes Relative: 6 %
NEUTROS PCT: 52 %
Neutro Abs: 4.7 10*3/uL (ref 1.4–6.5)
Platelets: 327 10*3/uL (ref 150–440)
RBC: 4.07 MIL/uL (ref 3.80–5.20)
RDW: 14 % (ref 11.5–14.5)
WBC: 8.9 10*3/uL (ref 3.6–11.0)

## 2015-12-27 LAB — BASIC METABOLIC PANEL
Anion gap: 9 (ref 5–15)
BUN: 13 mg/dL (ref 6–20)
CALCIUM: 9.4 mg/dL (ref 8.9–10.3)
CHLORIDE: 104 mmol/L (ref 101–111)
CO2: 30 mmol/L (ref 22–32)
CREATININE: 0.97 mg/dL (ref 0.44–1.00)
GFR calc non Af Amer: >60 mL/min (ref >60)
Glucose, Bld: 109 mg/dL — ABNORMAL HIGH (ref 65–99)
Potassium: 4.8 mmol/L (ref 3.5–5.1)
SODIUM: 143 mmol/L (ref 135–145)

## 2015-12-27 LAB — LACTIC ACID, PLASMA: LACTIC ACID, VENOUS: 0.8 mmol/L (ref 0.5–1.9)

## 2015-12-27 MED ORDER — IPRATROPIUM-ALBUTEROL 0.5-2.5 (3) MG/3ML IN SOLN
3.0000 mL | Freq: Once | RESPIRATORY_TRACT | Status: AC
  Administered 2015-12-27: 3 mL via RESPIRATORY_TRACT
  Filled 2015-12-27: qty 3

## 2015-12-27 MED ORDER — LORAZEPAM 1 MG PO TABS
ORAL_TABLET | ORAL | Status: AC
  Administered 2015-12-27: 1 mg via ORAL
  Filled 2015-12-27: qty 1

## 2015-12-27 MED ORDER — LORAZEPAM 1 MG PO TABS
1.0000 mg | ORAL_TABLET | Freq: Once | ORAL | Status: AC
  Administered 2015-12-27: 1 mg via ORAL

## 2015-12-27 MED ORDER — PREDNISONE 20 MG PO TABS
40.0000 mg | ORAL_TABLET | ORAL | Status: AC
  Administered 2015-12-27: 40 mg via ORAL
  Filled 2015-12-27: qty 2

## 2015-12-27 MED ORDER — ALBUTEROL SULFATE (2.5 MG/3ML) 0.083% IN NEBU
5.0000 mg | INHALATION_SOLUTION | Freq: Once | RESPIRATORY_TRACT | Status: AC
  Administered 2015-12-27: 5 mg via RESPIRATORY_TRACT
  Filled 2015-12-27: qty 6

## 2015-12-27 MED ORDER — ALBUTEROL SULFATE HFA 108 (90 BASE) MCG/ACT IN AERS: 2.0000 | INHALATION_SPRAY | RESPIRATORY_TRACT | 0 refills | Status: AC | PRN

## 2015-12-27 MED ORDER — PREDNISONE 10 MG PO TABS: ORAL_TABLET | ORAL | 0 refills | Status: DC

## 2015-12-27 MED ORDER — ALBUTEROL SULFATE (2.5 MG/3ML) 0.083% IN NEBU: 2.5000 mg | INHALATION_SOLUTION | Freq: Four times a day (QID) | RESPIRATORY_TRACT | 0 refills | Status: AC | PRN

## 2015-12-27 NOTE — ED Provider Notes (Signed)
-----------------------------------------   5:43 PM on 12/27/2015 -----------------------------------------  Patient is off BiPAP currently satting 96% on 4 L which is her baseline. Patient continues to have decreased breath sounds bilaterally with moderate expiratory wheeze. I again reiterated my desire to admit the patient to the hospital for COPD exacerbation. The patient strongly opposes this. Patient states she does not have a ride until 8 PM but wishes to sign out AGAINST MEDICAL ADVICE when her ride gets here. Patient is asking if we compared her back on the BiPAP machine until her ride can pick her up at 8 PM. I once again discussed with the patient are strong recommendation to admit to the hospital for further treatment. Patient understands the risk of leaving and adamantly denies admission. We'll place the patient back on BiPAP, I will continue to reiterated our desire to admit the patient however if she does leave AGAINST MEDICAL ADVICE she currently has capacity to do so.   Minna AntisKevin Kaylob Wallen, MD 12/28/15 [phone]

## 2015-12-27 NOTE — ED Notes (Signed)
Pt assisted with bedside commode.

## 2015-12-27 NOTE — ED Notes (Signed)
Dr. Scotty CourtStafford okay for pt to eat at this time. Pt is tolerating 4L [place] good at this time. Pt given food tray and drinks. Pt is sitting and appearing in less distress. Pt is able to speak whole sentences and able to move around.

## 2015-12-27 NOTE — ED Provider Notes (Signed)
[facility] Emergency Department Provider Note  ____________________________________________  Time seen: Approximately 2:26 PM  I have reviewed the triage vital signs and the nursing notes.   HISTORY  Chief Complaint Respiratory Distress Respiratory distress   HPI Julie Foley is a 60 y.o. female who complains of shortness of breath since yesterday. Gradual onset. She.is improving but then today, much worse. Has a history of CHF and COPD. Has been compliant with her medications by her report. Denies chest pain. Denies cough. Patient feels that this exacerbation is related to stress and an interpersonal conflict they got her upset yesterday..  Given 2 DuoNeb's, 125 of Solu-Medrol, 2 g of magnesium by EMS. She was refusing CPAP by EMS. She agrees to try BiPAP here.     Past Medical History:  Diagnosis Date  . Anxiety   . Asthma   . CHF (congestive heart failure) (HCC)   . Chronic respiratory failure (HCC)   . COPD (chronic obstructive pulmonary disease) (HCC)   . Depression   . GERD (gastroesophageal reflux disease)   . HTN (hypertension)      Patient Active Problem List   Diagnosis Date Noted  . Respiratory distress 12/08/2015  . Acute on chronic respiratory failure with hypoxemia (HCC) 08/17/2015  . Acute respiratory failure (HCC) 05/26/2015  . Sepsis (HCC) 02/16/2015  . COPD with acute exacerbation (HCC) 01/25/2015  . Acute respiratory failure with hypoxemia (HCC) 10/07/2014  . Anxiety 10/07/2014  . HTN (hypertension) 09/23/2014  . GERD (gastroesophageal reflux disease) 09/23/2014  . CHF (congestive heart failure) (HCC) 09/23/2014  . COPD exacerbation (HCC) 08/11/2014     Past Surgical History:  Procedure Laterality Date  . TUBAL LIGATION Bilateral      Prior to Admission medications   Medication Sig Start Date End Date Taking? Authorizing Provider  albuterol (PROVENTIL HFA) 108 (90 Base) MCG/ACT inhaler Inhale 2 puffs into the  lungs every 4 (four) hours as needed for wheezing or shortness of breath. 12/27/15   Sharman Cheek, MD  albuterol (PROVENTIL) (2.5 MG/3ML) 0.083% nebulizer solution Take 3 mLs (2.5 mg total) by nebulization every 6 (six) hours as needed for wheezing or shortness of breath. 12/27/15   Sharman Cheek, MD  ALPRAZolam Prudy Feeler) 0.5 MG tablet Take 1 tablet (0.5 mg total) by mouth 3 (three) times daily as needed for sleep or anxiety. 11/09/15 11/08/16  Jennye Moccasin, MD  amLODipine (NORVASC) 5 MG tablet Take 1 tablet (5 mg total) by mouth daily. 07/13/15   Merwyn Katos, MD  atenolol (TENORMIN) 50 MG tablet Take 1 tablet (50 mg total) by mouth daily. 07/13/15   Merwyn Katos, MD  EPINEPHrine 0.3 mg/0.3 mL IJ SOAJ injection Inject 0.3 mLs (0.3 mg total) into the muscle once. 08/17/15   Altamese Dilling, MD  fluticasone (FLONASE) 50 MCG/ACT nasal spray Place 1 spray into both nostrils 2 (two) times daily.     Historical Provider, MD  Fluticasone-Salmeterol (ADVAIR) 500-50 MCG/DOSE AEPB Inhale 1 puff into the lungs 2 (two) times daily.    Historical Provider, MD  hydrochlorothiazide (HYDRODIURIL) 25 MG tablet Take 1 tablet (25 mg total) by mouth daily. 07/13/15   Merwyn Katos, MD  ipratropium (ATROVENT) 0.02 % nebulizer solution Take 2.5 mLs (0.5 mg total) by nebulization 4 (four) times daily. 12/08/15   Wyatt Haste, MD  levalbuterol Pauline Aus) 0.63 MG/3ML nebulizer solution Take 3 mLs (0.63 mg total) by nebulization every 3 (three) hours as needed for wheezing or shortness of breath.  07/13/15   Merwyn Katosavid B Simonds, MD  loratadine (CLARITIN) 10 MG tablet Take 10 mg by mouth daily.    Historical Provider, MD  mometasone-formoterol (DULERA) 200-5 MCG/ACT AERO Inhale 2 puffs into the lungs 2 (two) times daily. 10/28/15   Emily FilbertJonathan E Williams, MD  montelukast (SINGULAIR) 10 MG tablet Take 1 tablet (10 mg total) by mouth at bedtime. 07/13/15   Merwyn Katosavid B Simonds, MD  nitroGLYCERIN (NITROSTAT) 0.4 MG SL tablet Place 0.4  mg under the tongue every 5 (five) minutes as needed for chest pain.    Historical Provider, MD  ondansetron (ZOFRAN) 4 MG/2ML SOLN injection Inject 2 mLs (4 mg total) into the vein every 6 (six) hours as needed for nausea. 07/13/15   Merwyn Katosavid B Simonds, MD  predniSONE (DELTASONE) 10 MG tablet Take five tablets a day (50 mg) for 3 days,  Then four tablets a day (40 mg) for 3 days  Then three tablets a day (30 mg) for 3 days  Then two tablets a day (20 mg) for 5 days  Then one tablet a day (10 mg) for 5 days 12/27/15   Sharman CheekPhillip Ismael Treptow, MD  roflumilast (DALIRESP) 500 MCG TABS tablet Take 1 tablet (500 mcg total) by mouth daily. 07/13/15   Merwyn Katosavid B Simonds, MD  tiotropium (SPIRIVA) 18 MCG inhalation capsule Place 18 mcg into inhaler and inhale daily.    Historical Provider, MD     Allergies Symbicort [budesonide-formoterol fumarate] and Formoterol fumarate   Family History  Problem Relation Age of Onset  . Cancer Mother   . Hypertension Mother   . Diabetes Mother   . Asthma Son     Social History Social History  Substance Use Topics  . Smoking status: Former Smoker    Packs/day: 2.00    Years: 35.00    Types: Cigarettes  . Smokeless tobacco: Never Used  . Alcohol use 0.0 oz/week     Comment: ocassional beer drinker    Review of Systems  Constitutional:   No fever or chills.  ENT:   No sore throat. No rhinorrhea.No recent illness Cardiovascular:   No chest pain. Respiratory:   Positive shortness of breath.  Musculoskeletal:   Negative for focal pain or swelling  10-point ROS otherwise negative.  ____________________________________________   PHYSICAL EXAM:  VITAL SIGNS: ED Triage Vitals  Enc Vitals Group     BP      Pulse      Resp      Temp      Temp src      SpO2      Weight      Height      Head Circumference      Peak Flow      Pain Score      Pain Loc      Pain Edu?      Excl. in GC?     Vital signs reviewed, nursing assessments  reviewed.   Constitutional:   Alert and oriented. Moderate respiratory distress.. Eyes:   No scleral icterus. No conjunctival pallor. PERRL. EOMI.  No nystagmus. ENT   Head:   Normocephalic and atraumatic.   Nose:   No congestion/rhinnorhea. No septal hematoma   Mouth/Throat:   MMM, no pharyngeal erythema. No peritonsillar mass.    Neck:   No stridor. No SubQ emphysema. No meningismus. Hematological/Lymphatic/Immunilogical:   No cervical lymphadenopathy. Cardiovascular:   RRR. Symmetric bilateral radial and DP pulses.  No murmurs.  Respiratory:   Increased work of  breathing with tripoding and accessory muscle use. Prolonged expiratory phase with diffuse expiratory wheezing. Symmetric air entry in all lung fields. No focal crackles.. Gastrointestinal:   Soft and nontender. Non distended. There is no CVA tenderness.  No rebound, rigidity, or guarding. Genitourinary:   deferred Musculoskeletal:   Nontender with normal range of motion in all extremities. No joint effusions.  No lower extremity tenderness.  No edema. Neurologic:   Normal speech and language.  CN 2-10 normal. Motor grossly intact. No gross focal neurologic deficits are appreciated.  Skin:    Skin is warm, dry and intact. No rash noted.  No petechiae, purpura, or bullae.  ____________________________________________    LABS (pertinent positives/negatives) (all labs ordered are listed, but only abnormal results are displayed) Labs Reviewed  BASIC METABOLIC PANEL - Abnormal; Notable for the following:       Result Value   Glucose, Bld 109 (*)    All other components within normal limits  CBC WITH DIFFERENTIAL/PLATELET - Abnormal; Notable for the following:    Eosinophils Absolute 1.4 (*)    All other components within normal limits  BLOOD GAS, VENOUS - Abnormal; Notable for the following:    pCO2, Ven 72 (*)    pO2, Ven 140.0 (*)    Bicarbonate 33.1 (*)    Acid-Base Excess 3.8 (*)    All other components  within normal limits  LACTIC ACID, PLASMA  LACTIC ACID, PLASMA   ____________________________________________   EKG  Interpreted by me Sinus tachycardia rate 142, normal axis QRS ST segments and T waves. QTC borderline prolonged, roughly 500 ms.  ____________________________________________    RADIOLOGY  Chest x-ray unremarkable  ____________________________________________   PROCEDURES Procedures CRITICAL CARE Performed by: Scotty Court, Nashid Pellum   Total critical care time: 35 minutes  Critical care time was exclusive of separately billable procedures and treating other patients.  Critical care was necessary to treat or prevent imminent or life-threatening deterioration.  Critical care was time spent personally by me on the following activities: development of treatment plan with patient and/or surrogate as well as nursing, discussions with consultants, evaluation of patient's response to treatment, examination of patient, obtaining history from patient or surrogate, ordering and performing treatments and interventions, ordering and review of laboratory studies, ordering and review of radiographic studies, pulse oximetry and re-evaluation of patient's condition.  ____________________________________________   INITIAL IMPRESSION / ASSESSMENT AND PLAN / ED COURSE  Pertinent labs & imaging results that were available during my care of the patient were reviewed by me and considered in my medical decision making (see chart for details).  Patient presents with respiratory distress, consistent with severe COPD exacerbation. Low suspicion for CHF, ACS, or PE at this time. We'll follow-up labs and x-ray and clinical progress. Will provide further bronchodilators with BiPAP.     Clinical Course  Value Comment By Time  pCO2, Ven: (!!) 72 Hypercapnia consistent with severe COPD exacerbation. Continue BiPAP. Patient is symptomatically feeling improved with BiPAP. Sharman Cheek,  MD 09/28 1449    ----------------------------------------- 3:28 PM on 12/27/2015 -----------------------------------------  Patient reassessed. Still diffusely wheezy slightly improved. Had a long discussion with the patient about her severe symptoms and needing to hospitalize her until she is further stabilized. She refuses. Advised that we could attempt to treat her without use of BiPAP which she has not a fan of, but still refuses and says she has to leave the hospital by 7 PM. Given that the eventual plan will be discharged AGAINST MEDICAL ADVICE, we will  discontinue BiPAP for now to gain a better sense of what her symptoms will be like out of hospital. Given a further dose of prednisone and DuoNeb now.The patient has severe disease, and we'll insist on leaving the hospital before this is adequately treated and is at high risk for further deterioration in her condition. Gave her strict return precautions that she must call 911 if she starts to feel worse. She does have medical decision-making capacity at this time and is not significantly impaired from her hypercapnia. ____________________________________________   FINAL CLINICAL IMPRESSION(S) / ED DIAGNOSES  Final diagnoses:  COPD exacerbation (HCC)  Respiratory distress       Portions of this note were generated with dragon dictation software. Dictation errors may occur despite best attempts at proofreading.    Sharman Cheek, MD 12/27/15 1531

## 2015-12-27 NOTE — ED Notes (Signed)
Pt is going to sign out AMA. Pt understands that she should be admitted to the hospital. Pt stating that she can take care of herself at home and if she becomes worse she will come back. Pt's ride is on the way at this time and pt is wanting to be out by 7pm.

## 2015-12-27 NOTE — Progress Notes (Signed)
Per Md request, patient taken off of BIPap and placed on [place] at this time. Patient states she wears 4l [place] at home, patient placed on 4l.

## 2015-12-27 NOTE — ED Notes (Signed)
Pt off Bipap and is receiving breathing tx at this time.

## 2015-12-27 NOTE — Progress Notes (Signed)
Patient placed back on bipap due to increased WOB and patient asking to be placed back on.

## 2015-12-27 NOTE — ED Triage Notes (Signed)
Pt brought in by EMS after experiencing SOB at home. Pt stating that she even "took xanax." Pt breathing fast and wearing NRB on arrival. Pt stating, "I don't want to be here." Respiratory and nurses at bedside on pt's arrival . Pt leaning forward in bed. Pt has been seen before and has hx of CHF and COPD.

## 2015-12-27 NOTE — ED Notes (Signed)
Pt appears to be more comfortable. Pt is tolerating mask good. Xray at bedside and breathing tx running at this time.

## 2015-12-27 NOTE — ED Notes (Addendum)
Called respiratory. Pt is to be placed back on Bipap per pt's request and Dr. Awanda MinkPaduchowski's request.

## 2015-12-27 NOTE — Discharge Instructions (Signed)
You were brought to the emergency room today by ambulance due to respiratory distress. You're having a very hard time breathing and required a lot of medications and extra oxygen to stabilize this. This will need further treatment in the hospital, but you refused to stay. Please monitor your symptoms closely and call 911 if your condition worsens. In the meantime continue taking your azithromycin, prednisone, and albuterol at home, and follow-up with Dr. Meredeth IdeFleming as soon as possible.

## 2015-12-27 NOTE — ED Notes (Signed)
Dr. Scotty CourtStafford notified of Co2 of 4072.

## 2015-12-29 LAB — BLOOD GAS, VENOUS
Acid-Base Excess: 3.8 mmol/L — ABNORMAL HIGH (ref 0.0–2.0)
Bicarbonate: 33.1 mmol/L — ABNORMAL HIGH (ref 20.0–28.0)
FIO2: 1
O2 Saturation: 98.8 %
PATIENT TEMPERATURE: 37
pCO2, Ven: 72 mmHg (ref 44.0–60.0)
pH, Ven: 7.27 (ref 7.250–7.430)
pO2, Ven: 140 mmHg — ABNORMAL HIGH (ref 32.0–45.0)

## 2016-02-16 ENCOUNTER — Encounter: Payer: Self-pay | Admitting: Emergency Medicine

## 2016-02-16 ENCOUNTER — Emergency Department: Payer: Medicaid Other

## 2016-02-16 ENCOUNTER — Inpatient Hospital Stay
Admission: EM | Admit: 2016-02-16 | Discharge: 2016-02-17 | DRG: 191 | Disposition: A | Discharge status: Home / Self Care | Payer: Medicaid Other | Attending: Internal Medicine | Admitting: Internal Medicine

## 2016-02-16 DIAGNOSIS — Z888 Allergy status to other drugs, medicaments and biological substances status: Secondary | ICD-10-CM

## 2016-02-16 DIAGNOSIS — F419 Anxiety disorder, unspecified: Secondary | ICD-10-CM | POA: Diagnosis present

## 2016-02-16 DIAGNOSIS — Z9981 Dependence on supplemental oxygen: Secondary | ICD-10-CM | POA: Diagnosis not present

## 2016-02-16 DIAGNOSIS — J441 Chronic obstructive pulmonary disease with (acute) exacerbation: Principal | ICD-10-CM | POA: Diagnosis present

## 2016-02-16 DIAGNOSIS — I110 Hypertensive heart disease with heart failure: Secondary | ICD-10-CM | POA: Diagnosis present

## 2016-02-16 DIAGNOSIS — R000 Tachycardia, unspecified: Secondary | ICD-10-CM | POA: Diagnosis present

## 2016-02-16 DIAGNOSIS — K219 Gastro-esophageal reflux disease without esophagitis: Secondary | ICD-10-CM | POA: Diagnosis present

## 2016-02-16 DIAGNOSIS — J9610 Chronic respiratory failure, unspecified whether with hypoxia or hypercapnia: Secondary | ICD-10-CM | POA: Diagnosis present

## 2016-02-16 DIAGNOSIS — R0602 Shortness of breath: Secondary | ICD-10-CM | POA: Diagnosis present

## 2016-02-16 DIAGNOSIS — Z87891 Personal history of nicotine dependence: Secondary | ICD-10-CM | POA: Diagnosis not present

## 2016-02-16 DIAGNOSIS — I5032 Chronic diastolic (congestive) heart failure: Secondary | ICD-10-CM | POA: Diagnosis present

## 2016-02-16 LAB — BASIC METABOLIC PANEL
Anion gap: 7 (ref 5–15)
BUN: 14 mg/dL (ref 6–20)
CALCIUM: 9 mg/dL (ref 8.9–10.3)
CO2: 33 mmol/L — AB (ref 22–32)
CREATININE: 1.03 mg/dL — AB (ref 0.44–1.00)
Chloride: 102 mmol/L (ref 101–111)
GFR calc non Af Amer: 59 mL/min — ABNORMAL LOW (ref >60)
GLUCOSE: 95 mg/dL (ref 65–99)
Potassium: 3.8 mmol/L (ref 3.5–5.1)
Sodium: 142 mmol/L (ref 135–145)

## 2016-02-16 LAB — CBC
HCT: 42.8 % (ref 35.0–47.0)
Hemoglobin: 14.2 g/dL (ref 12.0–16.0)
MCH: 32.7 pg (ref 26.0–34.0)
MCHC: 33.1 g/dL (ref 32.0–36.0)
MCV: 98.6 fL (ref 80.0–100.0)
PLATELETS: 270 10*3/uL (ref 150–440)
RBC: 4.34 MIL/uL (ref 3.80–5.20)
RDW: 13 % (ref 11.5–14.5)
WBC: 8.4 10*3/uL (ref 3.6–11.0)

## 2016-02-16 LAB — TROPONIN I

## 2016-02-16 MED ORDER — ENOXAPARIN SODIUM 40 MG/0.4ML ~~LOC~~ SOLN
40.0000 mg | SUBCUTANEOUS | Status: DC
  Administered 2016-02-16: 40 mg via SUBCUTANEOUS
  Filled 2016-02-16: qty 0.4

## 2016-02-16 MED ORDER — MONTELUKAST SODIUM 10 MG PO TABS
10.0000 mg | ORAL_TABLET | Freq: Every day | ORAL | Status: DC
  Administered 2016-02-16: 10 mg via ORAL
  Filled 2016-02-16: qty 1

## 2016-02-16 MED ORDER — ONDANSETRON HCL 4 MG PO TABS: 4.0000 mg | ORAL_TABLET | Freq: Four times a day (QID) | ORAL | Status: DC | PRN

## 2016-02-16 MED ORDER — LORAZEPAM 2 MG/ML IJ SOLN
1.0000 mg | Freq: Once | INTRAMUSCULAR | Status: AC
  Administered 2016-02-16: 1 mg via INTRAVENOUS

## 2016-02-16 MED ORDER — LEVALBUTEROL HCL 1.25 MG/0.5ML IN NEBU
1.2500 mg | INHALATION_SOLUTION | Freq: Four times a day (QID) | RESPIRATORY_TRACT | Status: DC
Start: 2016-02-16 — End: 2016-02-17
  Administered 2016-02-16 – 2016-02-17 (×5): 1.25 mg via RESPIRATORY_TRACT
  Filled 2016-02-16 (×5): qty 0.5

## 2016-02-16 MED ORDER — SENNOSIDES-DOCUSATE SODIUM 8.6-50 MG PO TABS: 1.0000 | ORAL_TABLET | Freq: Every evening | ORAL | Status: DC | PRN

## 2016-02-16 MED ORDER — ALBUTEROL SULFATE (2.5 MG/3ML) 0.083% IN NEBU
7.5000 mg | INHALATION_SOLUTION | Freq: Once | RESPIRATORY_TRACT | Status: AC
  Administered 2016-02-16: 7.5 mg via RESPIRATORY_TRACT
  Filled 2016-02-16: qty 9

## 2016-02-16 MED ORDER — LORATADINE 10 MG PO TABS
10.0000 mg | ORAL_TABLET | Freq: Every day | ORAL | Status: DC
Start: 2016-02-16 — End: 2016-02-17
  Administered 2016-02-16 – 2016-02-17 (×2): 10 mg via ORAL
  Filled 2016-02-16 (×2): qty 1

## 2016-02-16 MED ORDER — ACETAMINOPHEN 325 MG PO TABS
650.0000 mg | ORAL_TABLET | Freq: Four times a day (QID) | ORAL | Status: DC | PRN
Start: 2016-02-16 — End: 2016-02-17

## 2016-02-16 MED ORDER — IPRATROPIUM-ALBUTEROL 0.5-2.5 (3) MG/3ML IN SOLN
RESPIRATORY_TRACT | Status: AC
  Administered 2016-02-16: 3 mL via RESPIRATORY_TRACT
  Filled 2016-02-16: qty 6

## 2016-02-16 MED ORDER — SODIUM CHLORIDE 0.9% FLUSH
3.0000 mL | Freq: Two times a day (BID) | INTRAVENOUS | Status: DC
  Administered 2016-02-16 – 2016-02-17 (×3): 3 mL via INTRAVENOUS

## 2016-02-16 MED ORDER — LEVALBUTEROL HCL 1.25 MG/0.5ML IN NEBU: 1.2500 mg | INHALATION_SOLUTION | Freq: Four times a day (QID) | RESPIRATORY_TRACT | Status: DC

## 2016-02-16 MED ORDER — ACETAMINOPHEN 650 MG RE SUPP
650.0000 mg | Freq: Four times a day (QID) | RECTAL | Status: DC | PRN
Start: 2016-02-16 — End: 2016-02-17

## 2016-02-16 MED ORDER — IPRATROPIUM-ALBUTEROL 0.5-2.5 (3) MG/3ML IN SOLN: 3.0000 mL | RESPIRATORY_TRACT | Status: DC

## 2016-02-16 MED ORDER — IPRATROPIUM-ALBUTEROL 0.5-2.5 (3) MG/3ML IN SOLN
3.0000 mL | Freq: Once | RESPIRATORY_TRACT | Status: AC
  Administered 2016-02-16: 3 mL via RESPIRATORY_TRACT

## 2016-02-16 MED ORDER — BECLOMETHASONE DIPROPIONATE 80 MCG/ACT IN AERS: 2.0000 | INHALATION_SPRAY | Freq: Two times a day (BID) | RESPIRATORY_TRACT | Status: DC

## 2016-02-16 MED ORDER — ATENOLOL 25 MG PO TABS
50.0000 mg | ORAL_TABLET | Freq: Every day | ORAL | Status: DC
  Administered 2016-02-16 – 2016-02-17 (×2): 50 mg via ORAL
  Filled 2016-02-16 (×2): qty 2

## 2016-02-16 MED ORDER — LORAZEPAM 2 MG/ML IJ SOLN
INTRAMUSCULAR | Status: AC
  Administered 2016-02-16: 1 mg via INTRAVENOUS
  Filled 2016-02-16: qty 1

## 2016-02-16 MED ORDER — AMLODIPINE BESYLATE 5 MG PO TABS
5.0000 mg | ORAL_TABLET | Freq: Every day | ORAL | Status: DC
  Administered 2016-02-16 – 2016-02-17 (×2): 5 mg via ORAL
  Filled 2016-02-16 (×2): qty 1

## 2016-02-16 MED ORDER — NITROGLYCERIN 0.4 MG SL SUBL: 0.4000 mg | SUBLINGUAL_TABLET | SUBLINGUAL | Status: DC | PRN

## 2016-02-16 MED ORDER — HYDROCHLOROTHIAZIDE 25 MG PO TABS
25.0000 mg | ORAL_TABLET | Freq: Every day | ORAL | Status: DC
  Administered 2016-02-16 – 2016-02-17 (×2): 25 mg via ORAL
  Filled 2016-02-16 (×2): qty 1

## 2016-02-16 MED ORDER — LEVOFLOXACIN IN D5W 500 MG/100ML IV SOLN
500.0000 mg | INTRAVENOUS | Status: DC
  Administered 2016-02-16: 500 mg via INTRAVENOUS
  Filled 2016-02-16 (×2): qty 100

## 2016-02-16 MED ORDER — NICOTINE 21 MG/24HR TD PT24: 21.0000 mg | MEDICATED_PATCH | Freq: Every day | TRANSDERMAL | Status: DC

## 2016-02-16 MED ORDER — ONDANSETRON HCL 4 MG/2ML IJ SOLN: 4.0000 mg | Freq: Four times a day (QID) | INTRAMUSCULAR | Status: DC | PRN

## 2016-02-16 MED ORDER — IPRATROPIUM BROMIDE 0.02 % IN SOLN
0.5000 mg | Freq: Four times a day (QID) | RESPIRATORY_TRACT | Status: DC
  Administered 2016-02-17 (×3): 0.5 mg via RESPIRATORY_TRACT
  Filled 2016-02-16 (×3): qty 2.5

## 2016-02-16 MED ORDER — ALPRAZOLAM 0.5 MG PO TABS
0.5000 mg | ORAL_TABLET | Freq: Three times a day (TID) | ORAL | Status: DC | PRN
  Administered 2016-02-16 – 2016-02-17 (×2): 0.5 mg via ORAL
  Filled 2016-02-16 (×2): qty 1

## 2016-02-16 MED ORDER — FLUTICASONE PROPIONATE 50 MCG/ACT NA SUSP
1.0000 | Freq: Two times a day (BID) | NASAL | Status: DC
  Administered 2016-02-16 – 2016-02-17 (×2): 1 via NASAL
  Filled 2016-02-16: qty 16

## 2016-02-16 MED ORDER — TRAMADOL HCL 50 MG PO TABS
50.0000 mg | ORAL_TABLET | Freq: Four times a day (QID) | ORAL | Status: DC | PRN
  Administered 2016-02-17: 50 mg via ORAL
  Filled 2016-02-16: qty 1

## 2016-02-16 MED ORDER — ALBUTEROL SULFATE (2.5 MG/3ML) 0.083% IN NEBU: 5.0000 mg | INHALATION_SOLUTION | Freq: Once | RESPIRATORY_TRACT | Status: DC

## 2016-02-16 MED ORDER — ROFLUMILAST 500 MCG PO TABS
500.0000 ug | ORAL_TABLET | Freq: Every day | ORAL | Status: DC
  Administered 2016-02-16 – 2016-02-17 (×2): 500 ug via ORAL
  Filled 2016-02-16 (×2): qty 1

## 2016-02-16 MED ORDER — IPRATROPIUM BROMIDE 0.02 % IN SOLN
0.5000 mg | Freq: Four times a day (QID) | RESPIRATORY_TRACT | Status: DC
  Administered 2016-02-16: 0.5 mg via RESPIRATORY_TRACT
  Filled 2016-02-16: qty 2.5

## 2016-02-16 MED ORDER — MOMETASONE FURO-FORMOTEROL FUM 200-5 MCG/ACT IN AERO
2.0000 | INHALATION_SPRAY | Freq: Two times a day (BID) | RESPIRATORY_TRACT | Status: DC
  Filled 2016-02-16: qty 8.8

## 2016-02-16 NOTE — ED Notes (Signed)
Patient given meal tray and sprite.  

## 2016-02-16 NOTE — ED Provider Notes (Signed)
[facility] Emergency Department Provider Note  ____________________________________________   None    (approximate)  I have reviewed the triage vital signs and the nursing notes.   HISTORY  Chief Complaint Respiratory Distress   HPI Julie Foley is a 60 y.o. female with a history of CHF as well as COPD who is presenting to the emergency department today with respiratory distress. The patient says that the breathing issue started about one hour ago. She says that she is a tightness in her chest but this is typical for her exacerbations. Denies any nausea or vomiting. Denies any fever. Denies any productive cough.  Was placed on CPAP en route and given 1 DuoNeb as well as 125 mg of site Medrol.   Past Medical History:  Diagnosis Date  . Anxiety   . Asthma   . CHF (congestive heart failure) (HCC)   . Chronic respiratory failure (HCC)   . COPD (chronic obstructive pulmonary disease) (HCC)   . Depression   . GERD (gastroesophageal reflux disease)   . HTN (hypertension)     Patient Active Problem List   Diagnosis Date Noted  . Respiratory distress 12/08/2015  . Acute on chronic respiratory failure with hypoxemia (HCC) 08/17/2015  . Acute respiratory failure (HCC) 05/26/2015  . Sepsis (HCC) 02/16/2015  . COPD with acute exacerbation (HCC) 01/25/2015  . Acute respiratory failure with hypoxemia (HCC) 10/07/2014  . Anxiety 10/07/2014  . HTN (hypertension) 09/23/2014  . GERD (gastroesophageal reflux disease) 09/23/2014  . CHF (congestive heart failure) (HCC) 09/23/2014  . COPD exacerbation (HCC) 08/11/2014    Past Surgical History:  Procedure Laterality Date  . TUBAL LIGATION Bilateral     Prior to Admission medications   Medication Sig Start Date End Date Taking? Authorizing Provider  albuterol (PROVENTIL HFA) 108 (90 Base) MCG/ACT inhaler Inhale 2 puffs into the lungs every 4 (four) hours as needed for wheezing or shortness of breath.  12/27/15   Sharman Cheek, MD  albuterol (PROVENTIL) (2.5 MG/3ML) 0.083% nebulizer solution Take 3 mLs (2.5 mg total) by nebulization every 6 (six) hours as needed for wheezing or shortness of breath. 12/27/15   Sharman Cheek, MD  ALPRAZolam Prudy Feeler) 0.5 MG tablet Take 1 tablet (0.5 mg total) by mouth 3 (three) times daily as needed for sleep or anxiety. 11/09/15 11/08/16  Jennye Moccasin, MD  amLODipine (NORVASC) 5 MG tablet Take 1 tablet (5 mg total) by mouth daily. 07/13/15   Merwyn Katos, MD  atenolol (TENORMIN) 50 MG tablet Take 1 tablet (50 mg total) by mouth daily. 07/13/15   Merwyn Katos, MD  azithromycin (ZITHROMAX) 250 MG tablet Take 1 mg by mouth daily.    Historical Provider, MD  EPINEPHrine 0.3 mg/0.3 mL IJ SOAJ injection Inject 0.3 mLs (0.3 mg total) into the muscle once. 08/17/15   Altamese Dilling, MD  fluticasone (FLONASE) 50 MCG/ACT nasal spray Place 1 spray into both nostrils 2 (two) times daily.     Historical Provider, MD  Fluticasone-Salmeterol (ADVAIR) 500-50 MCG/DOSE AEPB Inhale 1 puff into the lungs 2 (two) times daily.    Historical Provider, MD  hydrochlorothiazide (HYDRODIURIL) 25 MG tablet Take 1 tablet (25 mg total) by mouth daily. 07/13/15   Merwyn Katos, MD  ipratropium (ATROVENT) 0.02 % nebulizer solution Take 2.5 mLs (0.5 mg total) by nebulization 4 (four) times daily. 12/08/15   Wyatt Haste, MD  levalbuterol Pauline Aus) 0.63 MG/3ML nebulizer solution Take 3 mLs (0.63 mg total) by nebulization  every 3 (three) hours as needed for wheezing or shortness of breath. 07/13/15   Merwyn Katosavid B Simonds, MD  loratadine (CLARITIN) 10 MG tablet Take 10 mg by mouth daily.    Historical Provider, MD  mometasone-formoterol (DULERA) 200-5 MCG/ACT AERO Inhale 2 puffs into the lungs 2 (two) times daily. 10/28/15   Emily FilbertJonathan E Williams, MD  montelukast (SINGULAIR) 10 MG tablet Take 1 tablet (10 mg total) by mouth at bedtime. 07/13/15   Merwyn Katosavid B Simonds, MD  nitroGLYCERIN (NITROSTAT) 0.4 MG  SL tablet Place 0.4 mg under the tongue every 5 (five) minutes as needed for chest pain.    Historical Provider, MD  ondansetron (ZOFRAN) 4 MG/2ML SOLN injection Inject 2 mLs (4 mg total) into the vein every 6 (six) hours as needed for nausea. Patient not taking: Reported on 12/27/2015 07/13/15   Merwyn Katosavid B Simonds, MD  predniSONE (DELTASONE) 10 MG tablet Take five tablets a day (50 mg) for 3 days,  Then four tablets a day (40 mg) for 3 days  Then three tablets a day (30 mg) for 3 days  Then two tablets a day (20 mg) for 5 days  Then one tablet a day (10 mg) for 5 days 12/27/15   Sharman CheekPhillip Stafford, MD  roflumilast (DALIRESP) 500 MCG TABS tablet Take 1 tablet (500 mcg total) by mouth daily. 07/13/15   Merwyn Katosavid B Simonds, MD  tiotropium (SPIRIVA) 18 MCG inhalation capsule Place 18 mcg into inhaler and inhale daily.    Historical Provider, MD    Allergies Symbicort [budesonide-formoterol fumarate] and Formoterol fumarate  Family History  Problem Relation Age of Onset  . Cancer Mother   . Hypertension Mother   . Diabetes Mother   . Asthma Son     Social History Social History  Substance Use Topics  . Smoking status: Former Smoker    Packs/day: 2.00    Years: 35.00    Types: Cigarettes  . Smokeless tobacco: Never Used  . Alcohol use 0.0 oz/week     Comment: ocassional beer drinker    Review of Systems Constitutional: No fever/chills Eyes: No visual changes. ENT: No sore throat. Cardiovascular: As above Respiratory: As above Gastrointestinal: No abdominal pain.  No nausea, no vomiting.  No diarrhea.  No constipation. Genitourinary: Negative for dysuria. Musculoskeletal: Negative for back pain. Skin: Negative for rash. Neurological: Negative for headaches, focal weakness or numbness.  10-point ROS otherwise negative.  ____________________________________________   PHYSICAL EXAM:  VITAL SIGNS: ED Triage Vitals  Enc Vitals Group     BP 02/16/16 1307 (!) 132/93     Pulse Rate  02/16/16 1305 (!) 132     Resp 02/16/16 1300 (!) 23     Temp --      Temp src --      SpO2 02/16/16 1300 97 %     Weight --      Height --      Head Circumference --      Peak Flow --      Pain Score --      Pain Loc --      Pain Edu? --      Excl. in GC? --     Constitutional: Wearing CPAP. Eyes: Conjunctivae are normal. PERRL. EOMI. Head: Atraumatic. Nose: No congestion/rhinnorhea. Mouth/Throat: Mucous membranes are moist.   Neck: No stridor.   Cardiovascular: Cardiac, regular rhythm. Grossly normal heart sounds.   Respiratory: Tachypneic. Tripoding. Severely decreased lung sounds with coarse rhonchi throughout. Prolonged expiration phase. Supraclavicular  retractions. Gastrointestinal: Soft and nontender. No distention. No CVA tenderness. Musculoskeletal: No lower extremity tenderness nor edema.   Neurologic:  Normal speech and language. No gross focal neurologic deficits are appreciated.  Skin:  Skin is warm, dry and intact. No rash noted. Psychiatric: Mood and affect are normal. Speech and behavior are normal.  ____________________________________________   LABS (all labs ordered are listed, but only abnormal results are displayed)  Labs Reviewed  BASIC METABOLIC PANEL - Abnormal; Notable for the following:       Result Value   CO2 33 (*)    Creatinine, Ser 1.03 (*)    GFR calc non Af Amer 59 (*)    All other components within normal limits  CBC  TROPONIN I   ____________________________________________  EKG  ED ECG REPORT I, Arelia LongestSchaevitz,  David M, the attending physician, personally viewed and interpreted this ECG.   Date: 02/16/2016  EKG Time: 1308  Rate: 124  Rhythm: sinus tachycardia  Axis: Normal axis  Intervals:none  ST&T Change: Diffuse ST depression likely rate related. No ST elevation. No abnormal T-wave inversion.  ____________________________________________  RADIOLOGY    DG Chest 1 View (Final result)  Result time 02/16/16 13:52:40    Final result by Jeronimo GreavesKyle Talbot, MD (02/16/16 13:52:40)           Narrative:   CLINICAL DATA: [facility]. Patient has significant respiratory history with multiple intubations in the past. Pt arrived to ED on CPAP. Hx/o COPD, CHF, and HTN. Former smoker.  EXAM: CHEST 1 VIEW  COMPARISON: 12/27/2015  FINDINGS: Numerous leads and wires project over the chest. Midline trachea. Borderline cardiomegaly. Mediastinal contours otherwise within normal limits. No pleural effusion or pneumothorax. Clear lungs.  IMPRESSION: Borderline cardiomegaly, without acute disease.   Electronically Signed By: Jeronimo GreavesKyle Talbot M.D. On: 02/16/2016 13:52          ____________________________________________   PROCEDURES  Procedure(s) performed:   CRITICAL CARE Performed by: Arelia LongestSchaevitz,  David M   Total critical care time: 35 minutes  Critical care time was exclusive of separately billable procedures and treating other patients.  Critical care was necessary to treat or prevent imminent or life-threatening deterioration.  Critical care was time spent personally by me on the following activities: development of treatment plan with patient and/or surrogate as well as nursing, discussions with consultants, evaluation of patient's response to treatment, examination of patient, obtaining history from patient or surrogate, ordering and performing treatments and interventions, ordering and review of laboratory studies, ordering and review of radiographic studies, pulse oximetry and re-evaluation of patient's condition.   Procedures  Critical Care performed:   ____________________________________________   INITIAL IMPRESSION / ASSESSMENT AND PLAN / ED COURSE  Pertinent labs & imaging results that were available during my care of the patient were reviewed by me and considered in my medical decision making (see chart for  details).   Clinical Course   ----------------------------------------- 1:50 PM on 02/16/2016 -----------------------------------------  Patient now with decrease laboring and respiration. Still with coarse breath sounds throughout. Speaking in full sentences. We will wean to albuterol by mask. Plan to admit to the hospital. Patient aware of the plan and willing to comply.  ----------------------------------------- 1:56 PM on 02/16/2016 -----------------------------------------  Patient tolerating albuterol nebs via facemask and now off BiPAP. Plan to admit to the hospital. Signed out to Dr. Tildon HuskyModi.  ____________________________________________   FINAL CLINICAL IMPRESSION(S) / ED DIAGNOSES  COPD exacerbation.    NEW MEDICATIONS STARTED  DURING THIS VISIT:  New Prescriptions   No medications on file     Note:  This document was prepared using Dragon voice recognition software and may include unintentional dictation errors.    Myrna Blazer, MD 02/16/16 1357

## 2016-02-16 NOTE — ED Triage Notes (Signed)
Patient [facility] from home for Respiratory distress, when EMS upon their arrival patient was in tripod position. Patient has significant respiratory history with multiple intubations in the past. Patient was given 2 grams of Mag if by EMS, 125 mg of Sol-u-Medrol, and 1 duoneb. Pt arrived to ED on CPAP, in moderate distress.

## 2016-02-16 NOTE — H&P (Addendum)
[facility] [place] [facility]   PATIENT NAME: Julie Foley    MR#:  [id number]  DATE OF BIRTH:  09-08-1955  DATE OF ADMISSION:  02/16/2016  PRIMARY CARE PHYSICIAN: Derwood Kaplan, MD   REQUESTING/REFERRING PHYSICIAN: Dr Langston Masker  CHIEF COMPLAINT:   Shortness of breath and dyspnea HISTORY OF PRESENT ILLNESS:  Julie Foley  is a 60 y.o. female with a known history of COPD with chronic respiratory failure on 4 L oxygen and also carpet presents above complaint. Patient reports that she was feeling very anxious and developed shortness of breath. She called EMS who brought her to the ER. En route patient was given DuoNeb and 125 mg of IV Solu-Medrol. In the emergency room she has received to date DuoNeb treatments as well as albuterol nebulizer treatment. She was initially placed on BiPAP and now has been weaned to 4 L of oxygen. She was initially tripoding and unable to speak any sentences. She is Still with some pursed lip breathing and difficulty speaking full sentences but no longer tripoding.  PAST MEDICAL HISTORY:   Past Medical History:  Diagnosis Date  . Anxiety   . Asthma   . CHF (congestive heart failure) (HCC)   . Chronic respiratory failure (HCC)   . COPD (chronic obstructive pulmonary disease) (HCC)   . Depression   . GERD (gastroesophageal reflux disease)   . HTN (hypertension)     PAST SURGICAL HISTORY:   Past Surgical History:  Procedure Laterality Date  . TUBAL LIGATION Bilateral     SOCIAL HISTORY:   Social History  Substance Use Topics  . Smoking status: Former Smoker    Packs/day: 2.00    Years: 35.00    Types: Cigarettes  . Smokeless tobacco: Never Used  . Alcohol use 0.0 oz/week     Comment: ocassional beer drinker    FAMILY HISTORY:   Family History  Problem Relation Age of Onset  . Cancer Mother   . Hypertension Mother   . Diabetes Mother   . Asthma Son     DRUG ALLERGIES:   Allergies  Allergen Reactions  .  Symbicort [Budesonide-Formoterol Fumarate] Swelling and Other (See Comments)    Reaction:  Tongue swelling   . Formoterol Fumarate Swelling and Other (See Comments)    Reaction:  Tongue swelling    REVIEW OF SYSTEMS:   Review of Systems  Constitutional: Negative.  Negative for chills, fever and malaise/fatigue.  HENT: Negative.  Negative for ear discharge, ear pain, hearing loss, nosebleeds and sore throat.   Eyes: Negative.  Negative for blurred vision and pain.  Respiratory: Positive for cough, sputum production, shortness of breath and wheezing. Negative for hemoptysis.   Cardiovascular: Negative.  Negative for chest pain, palpitations and leg swelling.  Gastrointestinal: Negative.  Negative for abdominal pain, blood in stool, diarrhea, nausea and vomiting.  Genitourinary: Negative.  Negative for dysuria.  Musculoskeletal: Negative.  Negative for back pain.  Skin: Negative.   Neurological: Negative for dizziness, tremors, speech change, focal weakness, seizures and headaches.  Endo/Heme/Allergies: Negative.  Does not bruise/bleed easily.  Psychiatric/Behavioral: Negative.  Negative for depression, hallucinations and suicidal ideas.    MEDICATIONS AT HOME:   Prior to Admission medications   Medication Sig Start Date End Date Taking? Authorizing Provider  albuterol (PROVENTIL HFA) 108 (90 Base) MCG/ACT inhaler Inhale 2 puffs into the lungs every 4 (four) hours as needed for wheezing or shortness of breath. 12/27/15  Yes Sharman Cheek, MD  albuterol (  PROVENTIL) (2.5 MG/3ML) 0.083% nebulizer solution Take 3 mLs (2.5 mg total) by nebulization every 6 (six) hours as needed for wheezing or shortness of breath. 12/27/15  Yes Sharman CheekPhillip Stafford, MD  ALPRAZolam Prudy Feeler(XANAX) 0.5 MG tablet Take 1 tablet (0.5 mg total) by mouth 3 (three) times daily as needed for sleep or anxiety. 11/09/15 11/08/16 Yes Jennye MoccasinBrian S Quigley, MD  amLODipine (NORVASC) 5 MG tablet Take 1 tablet (5 mg total) by mouth daily.  07/13/15  Yes Merwyn Katosavid B Simonds, MD  atenolol (TENORMIN) 50 MG tablet Take 1 tablet (50 mg total) by mouth daily. 07/13/15  Yes Merwyn Katosavid B Simonds, MD  azithromycin (ZITHROMAX) 250 MG tablet Take 1 mg by mouth daily.   Yes Historical Provider, MD  beclomethasone (QVAR) 80 MCG/ACT inhaler Inhale 2 puffs into the lungs 2 (two) times daily.   Yes Historical Provider, MD  EPINEPHrine 0.3 mg/0.3 mL IJ SOAJ injection Inject 0.3 mLs (0.3 mg total) into the muscle once. 08/17/15  Yes Altamese DillingVaibhavkumar Vachhani, MD  fluticasone (FLONASE) 50 MCG/ACT nasal spray Place 1 spray into both nostrils 2 (two) times daily.    Yes Historical Provider, MD  Fluticasone-Salmeterol (ADVAIR) 500-50 MCG/DOSE AEPB Inhale 1 puff into the lungs 2 (two) times daily.   Yes Historical Provider, MD  hydrochlorothiazide (HYDRODIURIL) 25 MG tablet Take 1 tablet (25 mg total) by mouth daily. 07/13/15  Yes Merwyn Katosavid B Simonds, MD  ipratropium (ATROVENT) 0.02 % nebulizer solution Take 2.5 mLs (0.5 mg total) by nebulization 4 (four) times daily. 12/08/15  Yes Wyatt Hasteavid K Hower, MD  loratadine (CLARITIN) 10 MG tablet Take 10 mg by mouth daily.   Yes Historical Provider, MD  montelukast (SINGULAIR) 10 MG tablet Take 1 tablet (10 mg total) by mouth at bedtime. 07/13/15  Yes Merwyn Katosavid B Simonds, MD  nitroGLYCERIN (NITROSTAT) 0.4 MG SL tablet Place 0.4 mg under the tongue every 5 (five) minutes as needed for chest pain.   Yes Historical Provider, MD  roflumilast (DALIRESP) 500 MCG TABS tablet Take 1 tablet (500 mcg total) by mouth daily. 07/13/15  Yes Merwyn Katosavid B Simonds, MD  tiotropium (SPIRIVA) 18 MCG inhalation capsule Place 18 mcg into inhaler and inhale daily.   Yes Historical Provider, MD  levalbuterol (XOPENEX) 0.63 MG/3ML nebulizer solution Take 3 mLs (0.63 mg total) by nebulization every 3 (three) hours as needed for wheezing or shortness of breath. Patient not taking: Reported on 02/16/2016 07/13/15   Merwyn Katosavid B Simonds, MD  mometasone-formoterol [facility](DULERA) 200-5 MCG/ACT  AERO Inhale 2 puffs into the lungs 2 (two) times daily. Patient not taking: Reported on 02/16/2016 10/28/15   Emily FilbertJonathan E Williams, MD  ondansetron [facility](ZOFRAN) 4 MG/2ML SOLN injection Inject 2 mLs (4 mg total) into the vein every 6 (six) hours as needed for nausea. Patient not taking: Reported on 02/16/2016 07/13/15   Merwyn Katosavid B Simonds, MD  predniSONE (DELTASONE) 10 MG tablet Take five tablets a day (50 mg) for 3 days,  Then four tablets a day (40 mg) for 3 days  Then three tablets a day (30 mg) for 3 days  Then two tablets a day (20 mg) for 5 days  Then one tablet a day (10 mg) for 5 days Patient not taking: Reported on 02/16/2016 12/27/15   Sharman CheekPhillip Stafford, MD      VITAL SIGNS:  Blood pressure (!) 132/93, pulse (!) 124, resp. rate 20, SpO2 97 %.  PHYSICAL EXAMINATION:   Physical Exam  Constitutional: She is oriented to person, place, and time. She appears distressed.  HENT:  Head: Normocephalic.  Eyes: No scleral icterus.  Neck: Normal range of motion. Neck supple. No JVD present. No tracheal deviation present.  Cardiovascular: Normal rate, regular rhythm and normal heart sounds.  Exam reveals no gallop and no friction rub.   No murmur heard. Pulmonary/Chest: She is in respiratory distress. She has wheezes. She has no rales. She exhibits no tenderness.  Increased work of breathing Speaking in sentences before BIPAP she was tripoding  Abdominal: Soft. Bowel sounds are normal. She exhibits no distension and no mass. There is no tenderness. There is no rebound and no guarding.  Musculoskeletal: Normal range of motion. She exhibits no edema.  Neurological: She is alert and oriented to person, place, and time.  Skin: Skin is warm. No rash noted. No erythema.  Psychiatric: Affect and judgment normal.      LABORATORY PANEL:   CBC  Recent Labs Lab 02/16/16 1320  WBC 8.4  HGB 14.2  HCT 42.8  PLT 270    ------------------------------------------------------------------------------------------------------------------  Chemistries   Recent Labs Lab 02/16/16 1320  NA 142  K 3.8  CL 102  CO2 33*  GLUCOSE 95  BUN 14  CREATININE 1.03*  CALCIUM 9.0   ------------------------------------------------------------------------------------------------------------------  Cardiac Enzymes  Recent Labs Lab 02/16/16 1320  TROPONINI <0.03   ------------------------------------------------------------------------------------------------------------------  RADIOLOGY:  Dg Chest 1 View  Result Date: 02/16/2016 CLINICAL DATA:  Patient brought in by [facility] from home for Respiratory distress. Patient has significant respiratory history with multiple intubations in the past. Pt arrived to ED on CPAP. Hx/o COPD, CHF, and HTN. Former smoker. EXAM: CHEST 1 VIEW COMPARISON:  12/27/2015 FINDINGS: Numerous leads and wires project over the chest. Midline trachea. Borderline cardiomegaly. Mediastinal contours otherwise within normal limits. No pleural effusion or pneumothorax. Clear lungs. IMPRESSION: Borderline cardiomegaly, without acute disease. Electronically Signed   By: Jeronimo GreavesKyle  Talbot M.D.   On: 02/16/2016 13:52    EKG:   Sinus tachycardia old anterior septal Q waves  IMPRESSION AND PLAN:   60 year old female with a history of chronic respiratory failure and COPD who presents with acute on chronic hypoxic respiratory failure with COPD exacerbation and acute bronchitis.  1. Acute on chronic hypoxic respiratory failure with COPD exacerbation: Patient has been weaned off BiPAPAnd will be placed on nasal cannula. Her respiratory status is still tedious.  Start aggressive IV steroids, Levaquin, Xopenex and inhalers   2. Acute COPD exacerbation: Plan as outlined above with IV steroids, Levaquin, Xopenex and inhalers Continued Daliresp 3.Sinus tachycardia: Patient will need telemetry Nebulizer  treatments changed to Xopenex   4. Chronic diastolic heart failure with preserved ejection fraction: Patient is not in exacerbation at this time  5. Essential hypertension: Continue HCTZ ,Norvasc and atenolol   All the records are reviewed and case discussed with ED provider. Management plans discussed with the patient and she is in agreement  CODE STATUS: FULL  Critical care time TOTAL TIME TAKING CARE OF THIS PATIENT: 45 minutes.   Patient still with tedious respiratory status and high risk for pulmonary. Carah Barrientes M.D on 02/16/2016 at 2:23 PM  Between 7am to 6pm - Pager - [phone]  After 6pm go to [email or web address] - Social research officer, governmentpassword EPAS [facility]  [facility] [place] [facility]  Office  [phone]  CC: Primary care physician; Derwood KaplanEason,  Ernest B, MD

## 2016-02-16 NOTE — Progress Notes (Signed)
Pt awaken from sleep SOB, dyspneic, grunting and looking scared. SAT's in the low 70's. RT called and advised to give scheduled breathing TX. MD notified and order placed to try CPAP at HS as Pt noted that this happens frequently when she is asleep at home.

## 2016-02-16 NOTE — Progress Notes (Signed)
Patient taken off of bipap, per Dr. Langston MaskerShaevitz request to see how she tolerates [place]. Patient given neb treatment then placed on 4l  as per her home setting.

## 2016-02-16 NOTE — Progress Notes (Signed)
Patient admitted to floor; meds as ordered; c/o of generalized pain; remi on 4l/[place]; no distress observed or voiced; remains on tele. Will continue to monitor

## 2016-02-17 LAB — BASIC METABOLIC PANEL
Anion gap: 6 (ref 5–15)
BUN: 17 mg/dL (ref 6–20)
CHLORIDE: 100 mmol/L — AB (ref 101–111)
CO2: 32 mmol/L (ref 22–32)
CREATININE: 0.81 mg/dL (ref 0.44–1.00)
Calcium: 9.4 mg/dL (ref 8.9–10.3)
Glucose, Bld: 121 mg/dL — ABNORMAL HIGH (ref 65–99)
Potassium: 3.9 mmol/L (ref 3.5–5.1)
SODIUM: 138 mmol/L (ref 135–145)

## 2016-02-17 LAB — CBC
HCT: 40.1 % (ref 35.0–47.0)
Hemoglobin: 13.6 g/dL (ref 12.0–16.0)
MCH: 33.1 pg (ref 26.0–34.0)
MCHC: 34.1 g/dL (ref 32.0–36.0)
MCV: 97.1 fL (ref 80.0–100.0)
PLATELETS: 265 10*3/uL (ref 150–440)
RBC: 4.13 MIL/uL (ref 3.80–5.20)
RDW: 12.9 % (ref 11.5–14.5)
WBC: 6.8 10*3/uL (ref 3.6–11.0)

## 2016-02-17 MED ORDER — NICOTINE 21 MG/24HR TD PT24: 21.0000 mg | MEDICATED_PATCH | Freq: Every day | TRANSDERMAL | 0 refills | Status: AC

## 2016-02-17 MED ORDER — LEVALBUTEROL HCL 1.25 MG/0.5ML IN NEBU: 1.2500 mg | INHALATION_SOLUTION | RESPIRATORY_TRACT | Status: DC | PRN

## 2016-02-17 MED ORDER — METHYLPREDNISOLONE SODIUM SUCC 125 MG IJ SOLR
60.0000 mg | Freq: Four times a day (QID) | INTRAMUSCULAR | Status: DC
  Administered 2016-02-17 (×2): 60 mg via INTRAVENOUS
  Filled 2016-02-17 (×2): qty 2

## 2016-02-17 MED ORDER — METHYLPREDNISOLONE SODIUM SUCC 125 MG IJ SOLR
125.0000 mg | Freq: Once | INTRAMUSCULAR | Status: AC
  Administered 2016-02-17: 125 mg via INTRAVENOUS
  Filled 2016-02-17: qty 2

## 2016-02-17 MED ORDER — ALPRAZOLAM 0.5 MG PO TABS: 0.5000 mg | ORAL_TABLET | Freq: Three times a day (TID) | ORAL | 0 refills | Status: AC | PRN

## 2016-02-17 MED ORDER — LEVOFLOXACIN 500 MG PO TABS: 500.0000 mg | ORAL_TABLET | Freq: Every day | ORAL | Status: DC

## 2016-02-17 MED ORDER — PREDNISONE 5 MG PO TABS: ORAL_TABLET | ORAL | 0 refills | Status: DC

## 2016-02-17 MED ORDER — LEVOFLOXACIN 500 MG PO TABS: 500.0000 mg | ORAL_TABLET | Freq: Every day | ORAL | 0 refills | Status: DC

## 2016-02-17 NOTE — Progress Notes (Signed)
Discharge instruction as ordered; voices understanding of discharge instruction; discharge via w/c by staff with family by side; Home Health to call tomorrow per Case Managemnt; patient made aware.

## 2016-02-17 NOTE — Care Management Note (Signed)
Case Management Note  Patient Details  Name: Maryruth BunSylvia L Wilemon MRN: [id number] Date of Birth: 21-Mar-1956  Subjective/Objective:      Referral to [facility] for Respiratory care, PT, RN. Dr Renae GlossWieting stated to this writer that he wants [name] to be evaluated at home for non-invasive ventilation but no order has been written. Updated [facility] about this request for non-invasive ventilation at patient's home per discharge home today.              Action/Plan:   Expected Discharge Date:                  Expected Discharge Plan:     In-House Referral:     Discharge planning Services     Post Acute Care Choice:    Choice offered to:     DME Arranged:    DME Agency:     HH Arranged:    HH Agency:     Status of Service:     If discussed at [employer] Length of Stay Meetings, dates discussed:    Additional Comments:  Latosha Gaylord A, RN 02/17/2016, 3:03 PM

## 2016-02-17 NOTE — Progress Notes (Signed)
Called that patient is having hypoxia when she falls asleep.  No history of OSA, here with COPD exacerbation.  Ordered CPAP QHS.  Of not, also did not see order for solumedrol in place.  Patient has listed allergy to inhaled steroids, listed as tongue swelling.  Chart review shows multiple admissions for COPD exacerbation and tolerated IV solumedrol well, even after the dates of her listed allergies.  Ordered IV solumedrol as part of COPD exacerbation treatment.  Kristeen MissWILLIS, Manan Olmo FIELDING [facility] 02/17/2016, 12:11 AM

## 2016-02-17 NOTE — Discharge Summary (Signed)
[facility] - [place] at [facility]   PATIENT NAME: Julie Foley    MR#:  [id number]  DATE OF BIRTH:  [birth year]  DATE OF ADMISSION:  02/16/2016 ADMITTING PHYSICIAN: Adrian Saran, MD  DATE OF DISCHARGE: 02/17/2016  PRIMARY CARE PHYSICIAN: Derwood Kaplan, MD    ADMISSION DIAGNOSIS:  COPD exacerbation (HCC) [J44.1]  DISCHARGE DIAGNOSIS:  Active Problems:   COPD exacerbation (HCC)   SECONDARY DIAGNOSIS:   Past Medical History:  Diagnosis Date  . Anxiety   . Asthma   . CHF (congestive heart failure) (HCC)   . Chronic respiratory failure (HCC)   . COPD (chronic obstructive pulmonary disease) (HCC)   . Depression   . GERD (gastroesophageal reflux disease)   . HTN (hypertension)     HOSPITAL COURSE:   1. Acute COPD exacerbation with bronchospasm on chronic respiratory failure. The patient was admitted yesterday and states that she wants to get out of the hospital today. She states she is breathing much better. I will prescribe prednisone taper, Levaquin. The patient has her inhalers and nebulizers at home. Patient chronically wears 4 L of oxygen at home. 2. The patient would benefit from noninvasive ventilation her CO2 retention. The patient tried BiPAP last night. Since the patient once to go home today, I told her I may not be able to set up noninvasive ventilation. I spoke with the care manager to look into this. She will also contact their home health agency to get the qualifying testing and look into this. The patient had a PCO2 of 72 which should qualify her. Noninvasive ventilation will help prevent readmissions by blowing off carbon dioxide. The patient did not want to stay in the hospital for any further testing here.  Hopefully home health RN and respiratory therapist can get further testing to get noninvasive ventilation approved. May have to follow-up with her pulmonologist Dr. Lavella Hammock if care manager unable to set up noninvasive ventilation. 3.  Anxiety. Patient asking for Xanax which helps her breathing when she gets her breathing attacks. I prescribed 30 pills of Xanax. She will need somebody that we'll prescribe this as outpatient. 4. Essential hypertension no changes in medication 5. GERD on PPI  DISCHARGE CONDITIONS:   Satisfactory  CONSULTS OBTAINED:   none  DRUG ALLERGIES:   Allergies  Allergen Reactions  . Symbicort [Budesonide-Formoterol Fumarate] Swelling and Other (See Comments)    Reaction:  Tongue swelling   . Formoterol Fumarate Swelling and Other (See Comments)    Reaction:  Tongue swelling    DISCHARGE MEDICATIONS:   Current Discharge Medication List    START taking these medications   Details  levofloxacin (LEVAQUIN) 500 MG tablet Take 1 tablet (500 mg total) by mouth daily. Qty: 6 tablet, Refills: 0    nicotine (NICODERM CQ - DOSED IN MG/24 HOURS) 21 mg/24hr patch Place 1 patch (21 mg total) onto the skin daily. Qty: 28 patch, Refills: 0      CONTINUE these medications which have CHANGED   Details  ALPRAZolam (XANAX) 0.5 MG tablet Take 1 tablet (0.5 mg total) by mouth 3 (three) times daily as needed for sleep or anxiety. Qty: 30 tablet, Refills: 0    predniSONE (DELTASONE) 5 MG tablet 6 tabs po day1; 5 tabs po day2; 4 tabs po day3; 3 tabs po day4; 2 tabs po day5; 1 tab po day6 Qty: 21 tablet, Refills: 0      CONTINUE these medications which have NOT CHANGED   Details  albuterol (PROVENTIL HFA) 108 (90 Base) MCG/ACT inhaler Inhale 2 puffs into the lungs every 4 (four) hours as needed for wheezing or shortness of breath. Qty: 1 Inhaler, Refills: 0    albuterol (PROVENTIL) (2.5 MG/3ML) 0.083% nebulizer solution Take 3 mLs (2.5 mg total) by nebulization every 6 (six) hours as needed for wheezing or shortness of breath. Qty: 75 mL, Refills: 0    amLODipine (NORVASC) 5 MG tablet Take 1 tablet (5 mg total) by mouth daily. Qty: 30 tablet, Refills: 10    atenolol (TENORMIN) 50 MG tablet Take 1  tablet (50 mg total) by mouth daily. Qty: 30 tablet, Refills: 10    azithromycin (ZITHROMAX) 250 MG tablet Take 1 mg by mouth daily.    beclomethasone (QVAR) 80 MCG/ACT inhaler Inhale 2 puffs into the lungs 2 (two) times daily.    EPINEPHrine 0.3 mg/0.3 mL IJ SOAJ injection Inject 0.3 mLs (0.3 mg total) into the muscle once. Qty: 1 Device, Refills: 0    fluticasone (FLONASE) 50 MCG/ACT nasal spray Place 1 spray into both nostrils 2 (two) times daily.     Fluticasone-Salmeterol (ADVAIR) 500-50 MCG/DOSE AEPB Inhale 1 puff into the lungs 2 (two) times daily.    hydrochlorothiazide (HYDRODIURIL) 25 MG tablet Take 1 tablet (25 mg total) by mouth daily. Qty: 30 tablet, Refills: 10    ipratropium (ATROVENT) 0.02 % nebulizer solution Take 2.5 mLs (0.5 mg total) by nebulization 4 (four) times daily. Qty: 75 mL, Refills: 12    loratadine (CLARITIN) 10 MG tablet Take 10 mg by mouth daily.    montelukast (SINGULAIR) 10 MG tablet Take 1 tablet (10 mg total) by mouth at bedtime. Qty: 30 tablet, Refills: 10    nitroGLYCERIN (NITROSTAT) 0.4 MG SL tablet Place 0.4 mg under the tongue every 5 (five) minutes as needed for chest pain.    roflumilast (DALIRESP) 500 MCG TABS tablet Take 1 tablet (500 mcg total) by mouth daily. Qty: 30 tablet, Refills: 10    tiotropium (SPIRIVA) 18 MCG inhalation capsule Place 18 mcg into inhaler and inhale daily.      STOP taking these medications     levalbuterol (XOPENEX) 0.63 MG/3ML nebulizer solution      mometasone-formoterol (DULERA) 200-5 MCG/ACT AERO      ondansetron (ZOFRAN) 4 MG/2ML SOLN injection          DISCHARGE INSTRUCTIONS:   Follow-up with PMD one week  If you experience worsening of your admission symptoms, develop shortness of breath, life threatening emergency, suicidal or homicidal thoughts you must seek medical attention immediately by calling 911 or calling your MD immediately  if symptoms less severe.  You Must read complete  instructions/literature along with all the possible adverse reactions/side effects for all the Medicines you take and that have been prescribed to you. Take any new Medicines after you have completely understood and accept all the possible adverse reactions/side effects.   Please note  You were cared for by a hospitalist during your hospital stay. If you have any questions about your discharge medications or the care you received while you were in the hospital after you are discharged, you can call the unit and asked to speak with the hospitalist on call if the hospitalist that took care of you is not available. Once you are discharged, your primary care physician will handle any further medical issues. Please note that NO REFILLS for any discharge medications will be authorized once you are discharged, as it is imperative that you return to  your primary care physician (or establish a relationship with a primary care physician if you do not have one) for your aftercare needs so that they can reassess your need for medications and monitor your lab values.    Today   CHIEF COMPLAINT:   Chief Complaint  Patient presents with  . Respiratory Distress    HISTORY OF PRESENT ILLNESS:  Julie Foley  is a 60 y.o. female presented with respiratory distress and COPD exacerbation   VITAL SIGNS:  Blood pressure (!) 141/74, pulse 77, temperature 98.3 F (36.8 C), temperature source Oral, resp. rate 17, height 5' 2.4" (1.585 m), weight 84.4 kg (186 lb 1.1 oz), SpO2 99 %.    PHYSICAL EXAMINATION:  GENERAL:  60 y.o.-year-old patient lying in the bed with no acute distress.  EYES: Pupils equal, round, reactive to light and accommodation. No scleral icterus. Extraocular muscles intact.  HEENT: Head atraumatic, normocephalic. Oropharynx and nasopharynx clear.  NECK:  Supple, no jugular venous distention. No thyroid enlargement, no tenderness.  LUNGS: Decreased breath sounds bilaterally, slight expiratory  wheezing. No rales,rhonchi or crepitation. No use of accessory muscles of respiration.  CARDIOVASCULAR: S1, S2 normal. No murmurs, rubs, or gallops.  ABDOMEN: Soft, non-tender, non-distended. Bowel sounds present. No organomegaly or mass.  EXTREMITIES: No pedal edema, cyanosis, or clubbing.  NEUROLOGIC: Cranial nerves II through XII are intact. Muscle strength 5/5 in all extremities. Sensation intact. Gait not checked.  PSYCHIATRIC: The patient is alert and oriented x 3.  SKIN: No obvious rash, lesion, or ulcer.   DATA REVIEW:   CBC  Recent Labs Lab 02/17/16 0544  WBC 6.8  HGB 13.6  HCT 40.1  PLT 265    Chemistries   Recent Labs Lab 02/17/16 0544  NA 138  K 3.9  CL 100*  CO2 32  GLUCOSE 121*  BUN 17  CREATININE 0.81  CALCIUM 9.4    Cardiac Enzymes  Recent Labs Lab 02/16/16 1320  TROPONINI <0.03    Microbiology Results  Results for orders placed or performed during the hospital encounter of 08/17/15  MRSA PCR Screening     Status: None   Collection Time: 08/17/15  4:50 AM  Result Value Ref Range Status   MRSA by PCR NEGATIVE NEGATIVE Final    Comment:        The GeneXpert MRSA Assay (FDA approved for NASAL specimens only), is one component of a comprehensive MRSA colonization surveillance program. It is not intended to diagnose MRSA infection nor to guide or monitor treatment for MRSA infections.     RADIOLOGY:  Dg Chest 1 View  Result Date: 02/16/2016 CLINICAL DATA:  Patient brought in by [facility] from home for Respiratory distress. Patient has significant respiratory history with multiple intubations in the past. Pt arrived to ED on CPAP. Hx/o COPD, CHF, and HTN. Former smoker. EXAM: CHEST 1 VIEW COMPARISON:  12/27/2015 FINDINGS: Numerous leads and wires project over the chest. Midline trachea. Borderline cardiomegaly. Mediastinal contours otherwise within normal limits. No pleural effusion or pneumothorax. Clear lungs. IMPRESSION: Borderline  cardiomegaly, without acute disease. Electronically Signed   By: Jeronimo Greaves M.D.   On: 02/16/2016 13:52    Management plans discussed with the patient, family and they are in agreement.  CODE STATUS:     Code Status Orders        Start     Ordered   02/16/16 1421  Full code  Continuous     02/16/16 1422    Code Status History  Date Active Date Inactive Code Status Order ID Comments User Context   12/08/2015  8:03 AM 12/08/2015  3:38 PM Full Code [id number]  Arnaldo NatalMichael S Diamond, MD Inpatient   08/17/2015  4:46 AM 08/17/2015  4:25 PM Full Code [id number]  Arnaldo NatalMichael S Diamond, MD Inpatient   07/12/2015  9:18 AM 07/13/2015  3:21 PM Full Code [id number]  Merwyn Katosavid B Simonds, MD ED   05/30/2015  3:47 PM 05/31/2015  2:18 PM Full Code [id number]  Milagros LollSrikar Sudini, MD ED   05/26/2015  8:18 PM 05/27/2015 12:18 PM Full Code [id number]  Alford Highlandichard Makalah Asberry, MD ED   02/16/2015  4:54 AM 02/17/2015  1:51 PM Full Code [id number]  Arnaldo NatalMichael S Diamond, MD Inpatient   01/25/2015  7:51 AM 01/25/2015  8:38 PM Full Code [id number]  Oralia Manisavid Willis, MD Inpatient   01/02/2015 11:42 PM 01/03/2015  6:47 PM Full Code [id number]  Ramonita LabAruna Gouru, MD Inpatient   12/10/2014  9:32 PM 12/11/2014  1:12 PM Full Code [id number]  Auburn BilberryShreyang Patel, MD Inpatient   10/07/2014  3:54 AM 10/09/2014  1:35 PM Full Code [id number]  Oralia Manisavid Willis, MD Inpatient   09/23/2014  3:18 AM 09/23/2014  1:57 PM Full Code [id number]  Oralia Manisavid Willis, MD Inpatient   08/11/2014  6:08 PM 08/13/2014  3:53 PM Full Code [id number]  Adrian SaranSital Mody, MD Inpatient      TOTAL TIME TAKING CARE OF THIS PATIENT: 35 minutes.    Alford HighlandWIETING, Aayra Hornbaker M.D on 02/17/2016 at 2:36 PM  Between 7am to 6pm - Pager - [phone]  After 6pm go to [email or web address] - password [email or web address]  [facility] Office  [phone]  CC: Primary care physician; Derwood KaplanEason,  Ernest B, MD

## 2016-02-17 NOTE — Discharge Instructions (Signed)
Chronic Obstructive Pulmonary Disease Exacerbation  Chronic obstructive pulmonary disease (COPD) is a common lung problem. In COPD, the flow of air from the lungs is limited. COPD exacerbations are times that breathing gets worse and you need extra treatment. Without treatment they can be life threatening. If they happen often, your lungs can become more damaged. If your COPD gets worse, your doctor may treat you with:  ? Medicines.  ? Oxygen.  ? Different ways to clear your airway, such as using a mask.    Follow these instructions at home:  ? Do not smoke.  ? Avoid tobacco smoke and other things that bother your lungs.  ? If given, take your antibiotic medicine as told. Finish the medicine even if you start to feel better.  ? Only take medicines as told by your doctor.  ? Drink enough fluids to keep your pee (urine) clear or pale yellow (unless your doctor has told you not to).  ? Use a cool mist machine (vaporizer).  ? If you use oxygen or a machine that turns liquid medicine into a mist (nebulizer), continue to use them as told.  ? Keep up with shots (vaccinations) as told by your doctor.  ? Exercise regularly.  ? Eat healthy foods.  ? Keep all doctor visits as told.  Get help right away if:  ? You are very short of breath and it gets worse.  ? You have trouble talking.  ? You have bad chest pain.  ? You have blood in your spit (sputum).  ? You have a fever.  ? You keep throwing up (vomiting).  ? You feel weak, or you pass out (faint).  ? You feel confused.  ? You keep getting worse.  This information is not intended to replace advice given to you by your health care provider. Make sure you discuss any questions you have with your health care provider.  Document Released: 03/06/2011 Document Revised: 08/23/2015 Document Reviewed: 11/19/2012  Elsevier Interactive Patient Education ? 2017 [employer]vier Inc[employer]

## 2016-02-17 NOTE — Progress Notes (Signed)
PHARMACIST - PHYSICIAN COMMUNICATION DR:   Mody CONCERNING: Antibiotic IV to Oral Route Change Policy  RECOMMENDATION: This patient is receiving Levofloxacin  by the intravenous route.  Based on criteria approved by the Pharmacy and Therapeutics Committee, the antibiotic(s) is/are being converted to the equivalent oral dose form(s).   DESCRIPTION: These criteria include:  Patient being treated for a respiratory tract infection, urinary tract infection, cellulitis or clostridium difficile associated diarrhea if on metronidazole  The patient is not neutropenic and does not exhibit a GI malabsorption state  The patient is eating (either orally or via tube) and/or has been taking other orally administered medications for a least 24 hours  The patient is improving clinically and has a Tmax < 100.5  If you have questions about this conversion, please contact the Pharmacy Department  []  ( [phone] )  [place] [x]  ( [phone] )  [facility] []  ( [phone] )   []  ( [phone] )  [facility] []  ( [phone] )  [facility]    Mckena Chern D. Sharolyn Weber, PharmD  

## 2016-02-18 NOTE — Care Management Note (Signed)
Case Management Note  Patient Details  Name: Julie Foley MRN: [id number] Date of Birth: 1955-09-25  Subjective/Objective:                   [facility] Patient has Medicaid which will not pay for NIV (non-invasive ventilator). I attempted to reach patient's daughter by phone but there was no answer. I spoke with patient phone. She states [facility]. She has a Insurance underwriterconcentrator. She has a nebulizer also and able to use it. [facility] more O2 tanks. I explained that Medicaid will not pay for NIV- she was aware. She states she will follow up with her PCP on Wednesday. Family member will provide transportation. She states she can't use public transportation "cause they pick me up at 0530AM and I can't lug my oxygen around for that long". She states "I may not go to my appointment Wednesday"; "They can't get my medicine right anyways". I advised patient that she needs to follow up with her PCP as planned.  She was able to talk in multiple sentences without stopping to breathe- she states she is doing better today.  Action/Plan:   Expected Discharge Date:                  Expected Discharge Plan:     In-House Referral:     Discharge planning Services  CM Consult  Post Acute Care Choice:  Durable Medical Equipment Choice offered to:  Adult Children  DME Arranged:    DME Agency:     HH Arranged:    HH Agency:     Status of Service:  Completed, signed off  If discussed at Long Length of Stay Meetings, dates discussed:    Additional Comments:  Collie Siadngela Takai Chiaramonte, RN 02/18/2016, 2:25 PM

## 2016-02-25 NOTE — Progress Notes (Signed)
[facility]  Patient Status: Closed, patient declined HH services. Stated she was doing fine.    Dimple CaseyJason E Hinton 02/25/2016, 8:51 AM

## 2016-03-10 ENCOUNTER — Emergency Department: Payer: Medicaid Other

## 2016-03-10 ENCOUNTER — Emergency Department
Admission: EM | Admit: 2016-03-10 | Discharge: 2016-03-10 | Discharge status: Against Medical Advice | Payer: Medicaid Other | Attending: Emergency Medicine | Admitting: Emergency Medicine

## 2016-03-10 DIAGNOSIS — Z87891 Personal history of nicotine dependence: Secondary | ICD-10-CM | POA: Diagnosis not present

## 2016-03-10 DIAGNOSIS — J441 Chronic obstructive pulmonary disease with (acute) exacerbation: Secondary | ICD-10-CM | POA: Diagnosis not present

## 2016-03-10 DIAGNOSIS — J45909 Unspecified asthma, uncomplicated: Secondary | ICD-10-CM | POA: Insufficient documentation

## 2016-03-10 DIAGNOSIS — R0600 Dyspnea, unspecified: Secondary | ICD-10-CM | POA: Diagnosis present

## 2016-03-10 DIAGNOSIS — Z79899 Other long term (current) drug therapy: Secondary | ICD-10-CM | POA: Insufficient documentation

## 2016-03-10 DIAGNOSIS — I509 Heart failure, unspecified: Secondary | ICD-10-CM | POA: Diagnosis not present

## 2016-03-10 DIAGNOSIS — I110 Hypertensive heart disease with heart failure: Secondary | ICD-10-CM | POA: Insufficient documentation

## 2016-03-10 LAB — BASIC METABOLIC PANEL
Anion gap: 5 (ref 5–15)
BUN: 8 mg/dL (ref 6–20)
CALCIUM: 9.3 mg/dL (ref 8.9–10.3)
CO2: 36 mmol/L — ABNORMAL HIGH (ref 22–32)
Chloride: 103 mmol/L (ref 101–111)
Creatinine, Ser: 0.83 mg/dL (ref 0.44–1.00)
GFR calc Af Amer: >60 mL/min (ref >60)
GLUCOSE: 127 mg/dL — AB (ref 65–99)
Potassium: 4.4 mmol/L (ref 3.5–5.1)
Sodium: 144 mmol/L (ref 135–145)

## 2016-03-10 LAB — CBC
HCT: 40.7 % (ref 35.0–47.0)
Hemoglobin: 13.5 g/dL (ref 12.0–16.0)
MCH: 33.2 pg (ref 26.0–34.0)
MCHC: 33.3 g/dL (ref 32.0–36.0)
MCV: 99.7 fL (ref 80.0–100.0)
PLATELETS: 320 10*3/uL (ref 150–440)
RBC: 4.08 MIL/uL (ref 3.80–5.20)
RDW: 13.2 % (ref 11.5–14.5)
WBC: 9.8 10*3/uL (ref 3.6–11.0)

## 2016-03-10 LAB — TROPONIN I: Troponin I: <0.03 ng/mL (ref <0.03)

## 2016-03-10 MED ORDER — IPRATROPIUM-ALBUTEROL 0.5-2.5 (3) MG/3ML IN SOLN
3.0000 mL | Freq: Once | RESPIRATORY_TRACT | Status: AC
  Administered 2016-03-10: 3 mL via RESPIRATORY_TRACT
  Filled 2016-03-10: qty 3

## 2016-03-10 MED ORDER — LORAZEPAM 2 MG/ML IJ SOLN
0.5000 mg | Freq: Once | INTRAMUSCULAR | Status: AC
Start: 2016-03-10 — End: 2016-03-10
  Administered 2016-03-10: 0.5 mg via INTRAVENOUS

## 2016-03-10 MED ORDER — LORAZEPAM 2 MG/ML IJ SOLN
INTRAMUSCULAR | Status: AC
  Filled 2016-03-10: qty 1

## 2016-03-10 MED ORDER — PREDNISONE 20 MG PO TABS: 40.0000 mg | ORAL_TABLET | Freq: Every day | ORAL | 0 refills | Status: DC

## 2016-03-10 MED ORDER — MAGNESIUM SULFATE 2 GM/50ML IV SOLN
2.0000 g | Freq: Once | INTRAVENOUS | Status: AC
  Administered 2016-03-10: 2 g via INTRAVENOUS
  Filled 2016-03-10: qty 50

## 2016-03-10 NOTE — ED Provider Notes (Signed)
[facility] Emergency Department Provider Note  Time seen: 4:02 AM  I have reviewed the triage vital signs and the nursing notes.   HISTORY  Chief Complaint Respiratory Distress    HPI Julie Foley is a 60 y.o. female with a past medical history of COPD, CHF, presents the emergency department in respiratory distress via EMS emergency traffic. Patient states overnight she became short of breath, attempted multiple breathing treatments at home with no relief so called EMS. EMS transported to the emergency department, an emergency traffic. Upon arrival patient satting 100% on nonrebreather, sitting upright in bed in moderate respiratory distress. Audible wheeze. Patient denies chest pain. EMS states the patient initially was refusing transport to the hospital but they were able to convince her.  Past Medical History:  Diagnosis Date  . Anxiety   . Asthma   . CHF (congestive heart failure) (HCC)   . Chronic respiratory failure (HCC)   . COPD (chronic obstructive pulmonary disease) (HCC)   . Depression   . GERD (gastroesophageal reflux disease)   . HTN (hypertension)     Patient Active Problem List   Diagnosis Date Noted  . Respiratory distress 12/08/2015  . Acute on chronic respiratory failure with hypoxemia (HCC) 08/17/2015  . Acute respiratory failure (HCC) 05/26/2015  . Sepsis (HCC) 02/16/2015  . COPD with acute exacerbation (HCC) 01/25/2015  . Acute respiratory failure with hypoxemia (HCC) 10/07/2014  . Anxiety 10/07/2014  . HTN (hypertension) 09/23/2014  . GERD (gastroesophageal reflux disease) 09/23/2014  . CHF (congestive heart failure) (HCC) 09/23/2014  . COPD exacerbation (HCC) 08/11/2014    Past Surgical History:  Procedure Laterality Date  . TUBAL LIGATION Bilateral     Prior to Admission medications   Medication Sig Start Date End Date Taking? Authorizing Provider  albuterol (PROVENTIL HFA) 108 (90 Base) MCG/ACT inhaler Inhale 2 puffs  into the lungs every 4 (four) hours as needed for wheezing or shortness of breath. 12/27/15   Sharman Cheek, MD  albuterol (PROVENTIL) (2.5 MG/3ML) 0.083% nebulizer solution Take 3 mLs (2.5 mg total) by nebulization every 6 (six) hours as needed for wheezing or shortness of breath. 12/27/15   Sharman Cheek, MD  ALPRAZolam Prudy Feeler) 0.5 MG tablet Take 1 tablet (0.5 mg total) by mouth 3 (three) times daily as needed for sleep or anxiety. 02/17/16 02/16/17  Alford Highland, MD  amLODipine (NORVASC) 5 MG tablet Take 1 tablet (5 mg total) by mouth daily. 07/13/15   Merwyn Katos, MD  atenolol (TENORMIN) 50 MG tablet Take 1 tablet (50 mg total) by mouth daily. 07/13/15   Merwyn Katos, MD  azithromycin (ZITHROMAX) 250 MG tablet Take 1 mg by mouth daily.    Historical Provider, MD  beclomethasone (QVAR) 80 MCG/ACT inhaler Inhale 2 puffs into the lungs 2 (two) times daily.    Historical Provider, MD  EPINEPHrine 0.3 mg/0.3 mL IJ SOAJ injection Inject 0.3 mLs (0.3 mg total) into the muscle once. 08/17/15   Altamese Dilling, MD  fluticasone (FLONASE) 50 MCG/ACT nasal spray Place 1 spray into both nostrils 2 (two) times daily.     Historical Provider, MD  Fluticasone-Salmeterol (ADVAIR) 500-50 MCG/DOSE AEPB Inhale 1 puff into the lungs 2 (two) times daily.    Historical Provider, MD  hydrochlorothiazide (HYDRODIURIL) 25 MG tablet Take 1 tablet (25 mg total) by mouth daily. 07/13/15   Merwyn Katos, MD  ipratropium (ATROVENT) 0.02 % nebulizer solution Take 2.5 mLs (0.5 mg total) by nebulization 4 (four) times  daily. 12/08/15   Wyatt Hasteavid K Hower, MD  levofloxacin (LEVAQUIN) 500 MG tablet Take 1 tablet (500 mg total) by mouth daily. 02/17/16   Alford Highlandichard Wieting, MD  loratadine (CLARITIN) 10 MG tablet Take 10 mg by mouth daily.    Historical Provider, MD  montelukast (SINGULAIR) 10 MG tablet Take 1 tablet (10 mg total) by mouth at bedtime. 07/13/15   Merwyn Katosavid B Simonds, MD  nicotine (NICODERM CQ - DOSED IN MG/24 HOURS)  21 mg/24hr patch Place 1 patch (21 mg total) onto the skin daily. 02/18/16   Alford Highlandichard Wieting, MD  nitroGLYCERIN (NITROSTAT) 0.4 MG SL tablet Place 0.4 mg under the tongue every 5 (five) minutes as needed for chest pain.    Historical Provider, MD  predniSONE (DELTASONE) 5 MG tablet 6 tabs po day1; 5 tabs po day2; 4 tabs po day3; 3 tabs po day4; 2 tabs po day5; 1 tab po day6 02/17/16   Alford Highlandichard Wieting, MD  roflumilast (DALIRESP) 500 MCG TABS tablet Take 1 tablet (500 mcg total) by mouth daily. 07/13/15   Merwyn Katosavid B Simonds, MD  tiotropium (SPIRIVA) 18 MCG inhalation capsule Place 18 mcg into inhaler and inhale daily.    Historical Provider, MD    Allergies  Allergen Reactions  . Symbicort [Budesonide-Formoterol Fumarate] Swelling and Other (See Comments)    Reaction:  Tongue swelling   . Formoterol Fumarate Swelling and Other (See Comments)    Reaction:  Tongue swelling    Family History  Problem Relation Age of Onset  . Cancer Mother   . Hypertension Mother   . Diabetes Mother   . Asthma Son     Social History Social History  Substance Use Topics  . Smoking status: Former Smoker    Packs/day: 2.00    Years: 35.00    Types: Cigarettes  . Smokeless tobacco: Never Used  . Alcohol use 0.0 oz/week     Comment: ocassional beer drinker    Review of Systems Constitutional: Negative for fever. Cardiovascular: Negative for chest pain. Respiratory: Positive for shortness of breath. Gastrointestinal: Negative for abdominal pain Neurological: Negative for headache 10-point ROS otherwise negative.  ____________________________________________   PHYSICAL EXAM:  Constitutional: Alert and oriented. Mild to moderate respiratory distress sitting upright in bed, tripod position. Eyes: Normal exam ENT   Head: Normocephalic and atraumatic   Mouth/Throat: Mucous membranes are moist. Cardiovascular: Normal rate, regular rhythm. No murmurs, rubs, or gallops. Respiratory: Sitting  upright in bed, moderate respiratory distress, moderate diffuse expiratory wheeze. No obvious rales or rhonchi. Gastrointestinal: Soft and nontender. No distention.   Musculoskeletal: Nontender with normal range of motion in all extremities. No lower extremity tenderness or edema. Neurologic:  Normal speech and language. No gross focal neurologic deficits are appreciated. Skin:  Skin is warm, dry and intact.  Psychiatric: Mood and affect are normal.   ____________________________________________    EKG  EKG reviewed and interpreted, such as sinus tachycardia 125 bpm, narrow QRS, normal axis, normal intervals, nonspecific ST changes with no ST elevations.  ____________________________________________    RADIOLOGY  Chest x-ray negative  ____________________________________________   INITIAL IMPRESSION / ASSESSMENT AND PLAN / ED COURSE  Pertinent labs & imaging results that were available during my care of the patient were reviewed by me and considered in my medical decision making (see chart for details).  The patient presents the emergency department with difficulty breathing. States began several hours ago, unrelieved with home breathing treatments. Patient is well-known to the emergency department with multiple visits including multiple  discharges AGAINST MEDICAL ADVICE for COPD exacerbation. We will start the patient on magnesium, 2 g via IV, continue BiPAP, DuoNeb times, close to monitor while awaiting lab and imaging results.  Chest x-ray negative. Labs are largely within normal limits. Troponin is negative. Patient is feeling better remains on BiPAP. States she is going to leave when her daughter gets here. As the patient is likely suffering a significant COPD exacerbation patient will sign out AGAINST MEDICAL ADVICE, as I would recommended admission to the hospital.  ____________________________________________   FINAL CLINICAL IMPRESSION(S) / ED DIAGNOSES  Dyspnea COPD  exacerbation    Minna AntisKevin Lenwood Balsam, MD 03/10/16 [phone]

## 2016-03-10 NOTE — ED Notes (Signed)
Pt declines offer for repositioning. Pt states 'i want to go home  Now, tell him to let me out of here."

## 2016-03-10 NOTE — ED Triage Notes (Signed)
Pt arrives in resp distress. Pt took 2 "breathing treatments" at home per ems without change. Pt agitated, resps labored. Wheezing noted in all lung fields. Prolonged exhalation noted. Skin moist, normal color.

## 2016-03-10 NOTE — ED Notes (Signed)
Pt with improved work of breathing and no wheezes auscultated, diminshed breath sounds in lower lobes, clear in upper lobes.

## 2016-03-10 NOTE — ED Notes (Signed)
md notified pt wishes to leave. Dr. Lenard Lancepaduchowski states pt will have to sign out AMA. Pt states she will sign out AMA. Pt requesting bipap mask removed and to be switched to nasal cannula at 4lpm.

## 2016-03-14 ENCOUNTER — Inpatient Hospital Stay
Admission: EM | Admit: 2016-03-14 | Discharge: 2016-03-24 | DRG: 207 | Disposition: A | Discharge status: Home Health | Payer: Medicaid Other | Attending: Internal Medicine | Admitting: Internal Medicine

## 2016-03-14 ENCOUNTER — Emergency Department: Payer: Medicaid Other

## 2016-03-14 ENCOUNTER — Inpatient Hospital Stay: Payer: Medicaid Other

## 2016-03-14 ENCOUNTER — Encounter: Payer: Self-pay | Admitting: Emergency Medicine

## 2016-03-14 ENCOUNTER — Inpatient Hospital Stay (HOSPITAL_COMMUNITY)
Admit: 2016-03-14 | Discharge: 2016-03-14 | Disposition: A | Discharge status: Home / Self Care | Payer: Medicaid Other | Attending: Critical Care Medicine | Admitting: Critical Care Medicine

## 2016-03-14 DIAGNOSIS — I110 Hypertensive heart disease with heart failure: Secondary | ICD-10-CM | POA: Diagnosis present

## 2016-03-14 DIAGNOSIS — Z825 Family history of asthma and other chronic lower respiratory diseases: Secondary | ICD-10-CM

## 2016-03-14 DIAGNOSIS — Z9981 Dependence on supplemental oxygen: Secondary | ICD-10-CM | POA: Diagnosis not present

## 2016-03-14 DIAGNOSIS — F17210 Nicotine dependence, cigarettes, uncomplicated: Secondary | ICD-10-CM | POA: Diagnosis present

## 2016-03-14 DIAGNOSIS — F1410 Cocaine abuse, uncomplicated: Secondary | ICD-10-CM | POA: Diagnosis present

## 2016-03-14 DIAGNOSIS — K219 Gastro-esophageal reflux disease without esophagitis: Secondary | ICD-10-CM | POA: Diagnosis present

## 2016-03-14 DIAGNOSIS — Z8249 Family history of ischemic heart disease and other diseases of the circulatory system: Secondary | ICD-10-CM

## 2016-03-14 DIAGNOSIS — R0603 Acute respiratory distress: Secondary | ICD-10-CM

## 2016-03-14 DIAGNOSIS — Z9119 Patient's noncompliance with other medical treatment and regimen: Secondary | ICD-10-CM | POA: Diagnosis not present

## 2016-03-14 DIAGNOSIS — J9811 Atelectasis: Secondary | ICD-10-CM | POA: Diagnosis present

## 2016-03-14 DIAGNOSIS — Z79899 Other long term (current) drug therapy: Secondary | ICD-10-CM | POA: Diagnosis not present

## 2016-03-14 DIAGNOSIS — Z888 Allergy status to other drugs, medicaments and biological substances status: Secondary | ICD-10-CM | POA: Diagnosis not present

## 2016-03-14 DIAGNOSIS — Z7951 Long term (current) use of inhaled steroids: Secondary | ICD-10-CM

## 2016-03-14 DIAGNOSIS — M6281 Muscle weakness (generalized): Secondary | ICD-10-CM

## 2016-03-14 DIAGNOSIS — J9600 Acute respiratory failure, unspecified whether with hypoxia or hypercapnia: Secondary | ICD-10-CM

## 2016-03-14 DIAGNOSIS — I5032 Chronic diastolic (congestive) heart failure: Secondary | ICD-10-CM | POA: Diagnosis present

## 2016-03-14 DIAGNOSIS — F419 Anxiety disorder, unspecified: Secondary | ICD-10-CM | POA: Diagnosis present

## 2016-03-14 DIAGNOSIS — I469 Cardiac arrest, cause unspecified: Secondary | ICD-10-CM | POA: Diagnosis not present

## 2016-03-14 DIAGNOSIS — J9621 Acute and chronic respiratory failure with hypoxia: Secondary | ICD-10-CM | POA: Diagnosis not present

## 2016-03-14 DIAGNOSIS — R1110 Vomiting, unspecified: Secondary | ICD-10-CM

## 2016-03-14 DIAGNOSIS — J80 Acute respiratory distress syndrome: Secondary | ICD-10-CM

## 2016-03-14 DIAGNOSIS — J9690 Respiratory failure, unspecified, unspecified whether with hypoxia or hypercapnia: Secondary | ICD-10-CM

## 2016-03-14 DIAGNOSIS — J9602 Acute respiratory failure with hypercapnia: Secondary | ICD-10-CM | POA: Diagnosis not present

## 2016-03-14 DIAGNOSIS — J9622 Acute and chronic respiratory failure with hypercapnia: Secondary | ICD-10-CM | POA: Diagnosis not present

## 2016-03-14 DIAGNOSIS — R0600 Dyspnea, unspecified: Secondary | ICD-10-CM

## 2016-03-14 DIAGNOSIS — R0689 Other abnormalities of breathing: Secondary | ICD-10-CM

## 2016-03-14 DIAGNOSIS — Z978 Presence of other specified devices: Secondary | ICD-10-CM

## 2016-03-14 DIAGNOSIS — R451 Restlessness and agitation: Secondary | ICD-10-CM | POA: Diagnosis not present

## 2016-03-14 DIAGNOSIS — R34 Anuria and oliguria: Secondary | ICD-10-CM | POA: Diagnosis present

## 2016-03-14 DIAGNOSIS — Z452 Encounter for adjustment and management of vascular access device: Secondary | ICD-10-CM

## 2016-03-14 DIAGNOSIS — I959 Hypotension, unspecified: Secondary | ICD-10-CM | POA: Diagnosis present

## 2016-03-14 DIAGNOSIS — F329 Major depressive disorder, single episode, unspecified: Secondary | ICD-10-CM | POA: Diagnosis present

## 2016-03-14 DIAGNOSIS — Z4659 Encounter for fitting and adjustment of other gastrointestinal appliance and device: Secondary | ICD-10-CM

## 2016-03-14 DIAGNOSIS — I272 Pulmonary hypertension, unspecified: Secondary | ICD-10-CM | POA: Diagnosis present

## 2016-03-14 DIAGNOSIS — J9601 Acute respiratory failure with hypoxia: Secondary | ICD-10-CM | POA: Diagnosis not present

## 2016-03-14 DIAGNOSIS — J441 Chronic obstructive pulmonary disease with (acute) exacerbation: Secondary | ICD-10-CM | POA: Diagnosis not present

## 2016-03-14 DIAGNOSIS — F1910 Other psychoactive substance abuse, uncomplicated: Secondary | ICD-10-CM | POA: Diagnosis not present

## 2016-03-14 DIAGNOSIS — Z01818 Encounter for other preprocedural examination: Secondary | ICD-10-CM

## 2016-03-14 DIAGNOSIS — J45901 Unspecified asthma with (acute) exacerbation: Secondary | ICD-10-CM | POA: Diagnosis not present

## 2016-03-14 LAB — BLOOD GAS, VENOUS
ACID-BASE EXCESS: 8.1 mmol/L — AB (ref 0.0–2.0)
Bicarbonate: 38.6 mmol/L — ABNORMAL HIGH (ref 20.0–28.0)
Delivery systems: POSITIVE
FIO2: 0.35
O2 SAT: 84.2 %
PATIENT TEMPERATURE: 37
PCO2 VEN: 84 mmHg — AB (ref 44.0–60.0)
pH, Ven: 7.27 (ref 7.250–7.430)
pO2, Ven: 56 mmHg — ABNORMAL HIGH (ref 32.0–45.0)

## 2016-03-14 LAB — CBC WITH DIFFERENTIAL/PLATELET
Basophils Absolute: 0 10*3/uL (ref 0–0.1)
Basophils Relative: 0 %
Eosinophils Absolute: 0.4 10*3/uL (ref 0–0.7)
Eosinophils Relative: 6 %
HEMATOCRIT: 37.3 % (ref 35.0–47.0)
Hemoglobin: 12.3 g/dL (ref 12.0–16.0)
LYMPHS ABS: 0.8 10*3/uL — AB (ref 1.0–3.6)
LYMPHS PCT: 12 %
MCH: 32.7 pg (ref 26.0–34.0)
MCHC: 33 g/dL (ref 32.0–36.0)
MCV: 99.3 fL (ref 80.0–100.0)
MONO ABS: 0.5 10*3/uL (ref 0.2–0.9)
MONOS PCT: 8 %
NEUTROS ABS: 4.9 10*3/uL (ref 1.4–6.5)
Neutrophils Relative %: 74 %
Platelets: 273 10*3/uL (ref 150–440)
RBC: 3.75 MIL/uL — ABNORMAL LOW (ref 3.80–5.20)
RDW: 13 % (ref 11.5–14.5)
WBC: 6.7 10*3/uL (ref 3.6–11.0)

## 2016-03-14 LAB — CBC
HCT: 41.3 % (ref 35.0–47.0)
Hemoglobin: 13.9 g/dL (ref 12.0–16.0)
MCH: 33.1 pg (ref 26.0–34.0)
MCHC: 33.7 g/dL (ref 32.0–36.0)
MCV: 98.1 fL (ref 80.0–100.0)
PLATELETS: 291 10*3/uL (ref 150–440)
RBC: 4.21 MIL/uL (ref 3.80–5.20)
RDW: 13.1 % (ref 11.5–14.5)
WBC: 7.7 10*3/uL (ref 3.6–11.0)

## 2016-03-14 LAB — BLOOD GAS, ARTERIAL
Acid-Base Excess: 3.7 mmol/L — ABNORMAL HIGH (ref 0.0–2.0)
Acid-Base Excess: 6.6 mmol/L — ABNORMAL HIGH (ref 0.0–2.0)
Bicarbonate: 35.1 mmol/L — ABNORMAL HIGH (ref 20.0–28.0)
Bicarbonate: 35.4 mmol/L — ABNORMAL HIGH (ref 20.0–28.0)
DELIVERY SYSTEMS: POSITIVE
Expiratory PAP: 7
FIO2: 100
FIO2: 40
FIO2: 40
MECHVT: 400 mL
MECHVT: 400 mL
O2 Saturation: 90.1 %
O2 Saturation: 95.1 %
PEEP: 5 cmH2O
PEEP: 5 cmH2O
Patient temperature: 37
Patient temperature: 37
Patient temperature: 37
RATE: 16 {breaths}/min
RATE: 22 {breaths}/min
pCO2 arterial: 120 mmHg (ref 32.0–48.0)
pCO2 arterial: 94 mmHg (ref 32.0–48.0)
pCO2 arterial: 72 mmHg (ref 32.0–48.0)
pH, Arterial: 7.07 — CL (ref 7.350–7.450)
pH, Arterial: 7.18 — CL (ref 7.350–7.450)
pH, Arterial: 7.3 — ABNORMAL LOW (ref 7.350–7.450)
pO2, Arterial: 256 mmHg — ABNORMAL HIGH (ref 83.0–108.0)
pO2, Arterial: 73 mmHg — ABNORMAL LOW (ref 83.0–108.0)
pO2, Arterial: 84 mmHg (ref 83.0–108.0)

## 2016-03-14 LAB — COMPREHENSIVE METABOLIC PANEL
ALBUMIN: 4.1 g/dL (ref 3.5–5.0)
ALK PHOS: 43 U/L (ref 38–126)
ALT: 28 U/L (ref 14–54)
ALT: 26 U/L (ref 14–54)
ANION GAP: 5 (ref 5–15)
AST: 29 U/L (ref 15–41)
AST: 19 U/L (ref 15–41)
Albumin: 4.6 g/dL (ref 3.5–5.0)
Alkaline Phosphatase: 49 U/L (ref 38–126)
Anion gap: 7 (ref 5–15)
BUN: 13 mg/dL (ref 6–20)
BUN: 16 mg/dL (ref 6–20)
CALCIUM: 8.6 mg/dL — AB (ref 8.9–10.3)
CHLORIDE: 101 mmol/L (ref 101–111)
CHLORIDE: 101 mmol/L (ref 101–111)
CO2: 33 mmol/L — AB (ref 22–32)
CO2: 34 mmol/L — AB (ref 22–32)
CREATININE: 0.81 mg/dL (ref 0.44–1.00)
Calcium: 9.3 mg/dL (ref 8.9–10.3)
Creatinine, Ser: 0.94 mg/dL (ref 0.44–1.00)
GFR calc Af Amer: >60 mL/min (ref >60)
GFR calc Af Amer: >60 mL/min (ref >60)
GFR calc non Af Amer: >60 mL/min (ref >60)
GFR calc non Af Amer: >60 mL/min (ref >60)
GLUCOSE: 182 mg/dL — AB (ref 65–99)
Glucose, Bld: 117 mg/dL — ABNORMAL HIGH (ref 65–99)
Potassium: 4.4 mmol/L (ref 3.5–5.1)
Potassium: 4.3 mmol/L (ref 3.5–5.1)
SODIUM: 141 mmol/L (ref 135–145)
SODIUM: 140 mmol/L (ref 135–145)
TOTAL PROTEIN: 7.4 g/dL (ref 6.5–8.1)
Total Bilirubin: 0.7 mg/dL (ref 0.3–1.2)
Total Bilirubin: 0.7 mg/dL (ref 0.3–1.2)
Total Protein: 6.5 g/dL (ref 6.5–8.1)

## 2016-03-14 LAB — MAGNESIUM
Magnesium: 2.1 mg/dL (ref 1.7–2.4)
Magnesium: 2 mg/dL (ref 1.7–2.4)

## 2016-03-14 LAB — GLUCOSE, CAPILLARY
GLUCOSE-CAPILLARY: 164 mg/dL — AB (ref 65–99)
Glucose-Capillary: 116 mg/dL — ABNORMAL HIGH (ref 65–99)
Glucose-Capillary: 106 mg/dL — ABNORMAL HIGH (ref 65–99)
Glucose-Capillary: 143 mg/dL — ABNORMAL HIGH (ref 65–99)

## 2016-03-14 LAB — PROTIME-INR
INR: 0.98
Prothrombin Time: 13 seconds (ref 11.4–15.2)

## 2016-03-14 LAB — TROPONIN I

## 2016-03-14 LAB — APTT: aPTT: 24 seconds (ref 24–36)

## 2016-03-14 LAB — ECHOCARDIOGRAM COMPLETE: Weight: 3120 oz

## 2016-03-14 LAB — LACTIC ACID, PLASMA
LACTIC ACID, VENOUS: 0.6 mmol/L (ref 0.5–1.9)
Lactic Acid, Venous: 0.7 mmol/L (ref 0.5–1.9)

## 2016-03-14 LAB — PHOSPHORUS
PHOSPHORUS: 3.1 mg/dL (ref 2.5–4.6)
Phosphorus: 6.3 mg/dL — ABNORMAL HIGH (ref 2.5–4.6)

## 2016-03-14 LAB — PROCALCITONIN

## 2016-03-14 LAB — MRSA PCR SCREENING: MRSA by PCR: NEGATIVE

## 2016-03-14 MED ORDER — VITAL HIGH PROTEIN PO LIQD: 1000.0000 mL | ORAL | Status: DC

## 2016-03-14 MED ORDER — BUDESONIDE 0.25 MG/2ML IN SUSP
0.2500 mg | Freq: Two times a day (BID) | RESPIRATORY_TRACT | Status: DC
  Administered 2016-03-14: 0.25 mg via RESPIRATORY_TRACT
  Filled 2016-03-14: qty 2

## 2016-03-14 MED ORDER — ROCURONIUM BROMIDE 50 MG/5ML IV SOLN
INTRAVENOUS | Status: AC
  Administered 2016-03-14: 10 mg
  Filled 2016-03-14: qty 1

## 2016-03-14 MED ORDER — FENTANYL 2500MCG IN NS 250ML (10MCG/ML) PREMIX INFUSION
0.0000 ug/h | INTRAVENOUS | Status: DC
  Administered 2016-03-14: 300 ug/h via INTRAVENOUS
  Administered 2016-03-14: 10 ug/h via INTRAVENOUS
  Administered 2016-03-15: 325 ug/h via INTRAVENOUS
  Administered 2016-03-15: 250 ug/h via INTRAVENOUS
  Administered 2016-03-15: 350 ug/h via INTRAVENOUS
  Administered 2016-03-16: 325 ug/h via INTRAVENOUS
  Administered 2016-03-16 (×2): 350 ug/h via INTRAVENOUS
  Administered 2016-03-16: 325 ug/h via INTRAVENOUS
  Administered 2016-03-17: 350 ug/h via INTRAVENOUS
  Administered 2016-03-17 (×2): 325 ug/h via INTRAVENOUS
  Administered 2016-03-17: 350 ug/h via INTRAVENOUS
  Administered 2016-03-18 (×2): 375 ug/h via INTRAVENOUS
  Administered 2016-03-18: 350 ug/h via INTRAVENOUS
  Administered 2016-03-19: 300 ug/h via INTRAVENOUS
  Administered 2016-03-19: 375 ug/h via INTRAVENOUS
  Administered 2016-03-19: 376 ug/h via INTRAVENOUS
  Administered 2016-03-20: 400 ug/h via INTRAVENOUS
  Administered 2016-03-20: 375 ug/h via INTRAVENOUS
  Administered 2016-03-20: 400 ug/h via INTRAVENOUS
  Administered 2016-03-20: 375 ug/h via INTRAVENOUS
  Filled 2016-03-14 (×27): qty 250

## 2016-03-14 MED ORDER — BECLOMETHASONE DIPROPIONATE 80 MCG/ACT IN AERS: 2.0000 | INHALATION_SPRAY | Freq: Two times a day (BID) | RESPIRATORY_TRACT | Status: DC

## 2016-03-14 MED ORDER — IPRATROPIUM-ALBUTEROL 0.5-2.5 (3) MG/3ML IN SOLN
3.0000 mL | Freq: Once | RESPIRATORY_TRACT | Status: AC
  Administered 2016-03-14: 3 mL via RESPIRATORY_TRACT

## 2016-03-14 MED ORDER — FLUTICASONE PROPIONATE 50 MCG/ACT NA SUSP
1.0000 | Freq: Two times a day (BID) | NASAL | Status: DC
  Filled 2016-03-14: qty 16

## 2016-03-14 MED ORDER — DEXTROSE 5 % IV SOLN
1.0000 g | INTRAVENOUS | Status: DC
  Administered 2016-03-15 – 2016-03-16 (×2): 1 g via INTRAVENOUS
  Filled 2016-03-14 (×3): qty 10

## 2016-03-14 MED ORDER — LORATADINE 10 MG PO TABS: 10.0000 mg | ORAL_TABLET | Freq: Every day | ORAL | Status: DC

## 2016-03-14 MED ORDER — MONTELUKAST SODIUM 10 MG PO TABS: 10.0000 mg | ORAL_TABLET | Freq: Every day | ORAL | Status: DC

## 2016-03-14 MED ORDER — FENTANYL CITRATE (PF) 100 MCG/2ML IJ SOLN
INTRAMUSCULAR | Status: AC
  Administered 2016-03-14: 100 ug via INTRAVENOUS
  Filled 2016-03-14: qty 2

## 2016-03-14 MED ORDER — SODIUM CHLORIDE 0.9% FLUSH
10.0000 mL | Freq: Two times a day (BID) | INTRAVENOUS | Status: DC
  Administered 2016-03-14: 30 mL
  Administered 2016-03-14: 10 mL
  Administered 2016-03-15: 20 mL
  Administered 2016-03-15 – 2016-03-22 (×11): 10 mL

## 2016-03-14 MED ORDER — MIDAZOLAM HCL 2 MG/2ML IJ SOLN
2.0000 mg | INTRAMUSCULAR | Status: AC | PRN
  Administered 2016-03-15 – 2016-03-16 (×3): 2 mg via INTRAVENOUS
  Filled 2016-03-14 (×5): qty 2

## 2016-03-14 MED ORDER — FENTANYL BOLUS VIA INFUSION
50.0000 ug | INTRAVENOUS | Status: DC | PRN
  Administered 2016-03-15 – 2016-03-21 (×12): 50 ug via INTRAVENOUS
  Filled 2016-03-14: qty 50

## 2016-03-14 MED ORDER — ENOXAPARIN SODIUM 40 MG/0.4ML ~~LOC~~ SOLN
40.0000 mg | SUBCUTANEOUS | Status: DC
  Administered 2016-03-14 – 2016-03-23 (×10): 40 mg via SUBCUTANEOUS
  Filled 2016-03-14 (×10): qty 0.4

## 2016-03-14 MED ORDER — ONDANSETRON HCL 4 MG/2ML IJ SOLN: 4.0000 mg | Freq: Four times a day (QID) | INTRAMUSCULAR | Status: DC | PRN

## 2016-03-14 MED ORDER — SENNOSIDES-DOCUSATE SODIUM 8.6-50 MG PO TABS
1.0000 | ORAL_TABLET | Freq: Two times a day (BID) | ORAL | Status: DC
  Administered 2016-03-14 – 2016-03-18 (×8): 1 via ORAL
  Filled 2016-03-14 (×6): qty 1

## 2016-03-14 MED ORDER — CHLORHEXIDINE GLUCONATE 0.12% ORAL RINSE (MEDLINE KIT)
15.0000 mL | Freq: Two times a day (BID) | OROMUCOSAL | Status: DC
  Administered 2016-03-14 – 2016-03-15 (×2): 15 mL via OROMUCOSAL

## 2016-03-14 MED ORDER — SODIUM CHLORIDE 0.9% FLUSH: 10.0000 mL | INTRAVENOUS | Status: DC | PRN

## 2016-03-14 MED ORDER — AMLODIPINE BESYLATE 5 MG PO TABS: 5.0000 mg | ORAL_TABLET | Freq: Every day | ORAL | Status: DC

## 2016-03-14 MED ORDER — TIOTROPIUM BROMIDE MONOHYDRATE 18 MCG IN CAPS
18.0000 ug | ORAL_CAPSULE | Freq: Every day | RESPIRATORY_TRACT | Status: DC
  Filled 2016-03-14: qty 5

## 2016-03-14 MED ORDER — IPRATROPIUM-ALBUTEROL 0.5-2.5 (3) MG/3ML IN SOLN
3.0000 mL | Freq: Four times a day (QID) | RESPIRATORY_TRACT | Status: DC
  Filled 2016-03-14: qty 3

## 2016-03-14 MED ORDER — LORAZEPAM 2 MG/ML IJ SOLN
1.0000 mg | Freq: Once | INTRAMUSCULAR | Status: AC
  Administered 2016-03-14: 1 mg via INTRAVENOUS

## 2016-03-14 MED ORDER — FENTANYL CITRATE (PF) 100 MCG/2ML IJ SOLN
50.0000 ug | Freq: Once | INTRAMUSCULAR | Status: AC
  Administered 2016-03-14: 100 ug via INTRAVENOUS

## 2016-03-14 MED ORDER — ROFLUMILAST 500 MCG PO TABS
500.0000 ug | ORAL_TABLET | Freq: Every day | ORAL | Status: DC
  Filled 2016-03-14: qty 1

## 2016-03-14 MED ORDER — METHYLPREDNISOLONE SODIUM SUCC 125 MG IJ SOLR: 125.0000 mg | Freq: Once | INTRAMUSCULAR | Status: DC

## 2016-03-14 MED ORDER — ONDANSETRON HCL 4 MG PO TABS: 4.0000 mg | ORAL_TABLET | Freq: Four times a day (QID) | ORAL | Status: DC | PRN

## 2016-03-14 MED ORDER — LORAZEPAM 2 MG/ML IJ SOLN
1.0000 mg | Freq: Once | INTRAMUSCULAR | Status: DC
  Administered 2016-03-14: 1 mg via INTRAVENOUS

## 2016-03-14 MED ORDER — MOMETASONE FURO-FORMOTEROL FUM 200-5 MCG/ACT IN AERO: 2.0000 | INHALATION_SPRAY | Freq: Two times a day (BID) | RESPIRATORY_TRACT | Status: DC

## 2016-03-14 MED ORDER — ACETAMINOPHEN 325 MG PO TABS: 650.0000 mg | ORAL_TABLET | Freq: Four times a day (QID) | ORAL | Status: DC | PRN

## 2016-03-14 MED ORDER — DEXMEDETOMIDINE HCL IN NACL 400 MCG/100ML IV SOLN
0.4000 ug/kg/h | INTRAVENOUS | Status: DC
  Administered 2016-03-14: 0.5 ug/kg/h via INTRAVENOUS
  Filled 2016-03-14: qty 100

## 2016-03-14 MED ORDER — ATENOLOL 25 MG PO TABS: 50.0000 mg | ORAL_TABLET | Freq: Every day | ORAL | Status: DC

## 2016-03-14 MED ORDER — ORAL CARE MOUTH RINSE
15.0000 mL | Freq: Four times a day (QID) | OROMUCOSAL | Status: DC
  Administered 2016-03-15 (×2): 15 mL via OROMUCOSAL

## 2016-03-14 MED ORDER — VITAL HIGH PROTEIN PO LIQD
1000.0000 mL | ORAL | Status: DC
  Administered 2016-03-14 – 2016-03-21 (×7): 1000 mL

## 2016-03-14 MED ORDER — MIDAZOLAM HCL 2 MG/2ML IJ SOLN
INTRAMUSCULAR | Status: AC
  Administered 2016-03-14: 2 mg
  Filled 2016-03-14: qty 2

## 2016-03-14 MED ORDER — PRO-STAT SUGAR FREE PO LIQD: 30.0000 mL | Freq: Two times a day (BID) | ORAL | Status: DC

## 2016-03-14 MED ORDER — FAMOTIDINE IN NACL 20-0.9 MG/50ML-% IV SOLN
20.0000 mg | Freq: Two times a day (BID) | INTRAVENOUS | Status: DC
  Administered 2016-03-14: 20 mg via INTRAVENOUS
  Filled 2016-03-14: qty 50

## 2016-03-14 MED ORDER — ALBUTEROL SULFATE (2.5 MG/3ML) 0.083% IN NEBU
2.5000 mg | INHALATION_SOLUTION | Freq: Once | RESPIRATORY_TRACT | Status: AC
  Administered 2016-03-14: 2.5 mg via RESPIRATORY_TRACT
  Filled 2016-03-14: qty 3

## 2016-03-14 MED ORDER — LORAZEPAM 2 MG/ML IJ SOLN
INTRAMUSCULAR | Status: AC
  Administered 2016-03-14: 1 mg via INTRAVENOUS
  Filled 2016-03-14: qty 1

## 2016-03-14 MED ORDER — MIDAZOLAM HCL 2 MG/2ML IJ SOLN
2.0000 mg | INTRAMUSCULAR | Status: DC | PRN
  Administered 2016-03-15 – 2016-03-22 (×12): 2 mg via INTRAVENOUS
  Filled 2016-03-14 (×10): qty 2

## 2016-03-14 MED ORDER — HYDROCHLOROTHIAZIDE 25 MG PO TABS: 25.0000 mg | ORAL_TABLET | Freq: Every day | ORAL | Status: DC

## 2016-03-14 MED ORDER — SODIUM CHLORIDE 0.9% FLUSH
3.0000 mL | Freq: Two times a day (BID) | INTRAVENOUS | Status: DC
  Administered 2016-03-14: 3 mL via INTRAVENOUS

## 2016-03-14 MED ORDER — IPRATROPIUM-ALBUTEROL 0.5-2.5 (3) MG/3ML IN SOLN
3.0000 mL | RESPIRATORY_TRACT | Status: DC
  Administered 2016-03-14 – 2016-03-21 (×42): 3 mL via RESPIRATORY_TRACT
  Filled 2016-03-14 (×41): qty 3

## 2016-03-14 MED ORDER — ACETAMINOPHEN 650 MG RE SUPP
650.0000 mg | Freq: Four times a day (QID) | RECTAL | Status: DC | PRN
Start: 2016-03-14 — End: 2016-03-21

## 2016-03-14 MED ORDER — NITROGLYCERIN 0.4 MG SL SUBL
0.4000 mg | SUBLINGUAL_TABLET | SUBLINGUAL | Status: DC | PRN
Start: 2016-03-14 — End: 2016-03-21

## 2016-03-14 MED ORDER — SENNOSIDES-DOCUSATE SODIUM 8.6-50 MG PO TABS
1.0000 | ORAL_TABLET | Freq: Every evening | ORAL | Status: DC | PRN
  Administered 2016-03-16 – 2016-03-17 (×2): 1 via ORAL
  Filled 2016-03-14 (×3): qty 1

## 2016-03-14 MED ORDER — ALPRAZOLAM 0.5 MG PO TABS: 0.5000 mg | ORAL_TABLET | Freq: Three times a day (TID) | ORAL | Status: DC | PRN

## 2016-03-14 MED ORDER — CEFTRIAXONE SODIUM 1 G IJ SOLR
1.0000 g | Freq: Once | INTRAMUSCULAR | Status: AC
  Administered 2016-03-14: 1 g via INTRAVENOUS
  Filled 2016-03-14: qty 10

## 2016-03-14 MED ORDER — NOREPINEPHRINE BITARTRATE 1 MG/ML IV SOLN
0.0000 ug/min | INTRAVENOUS | Status: DC
  Administered 2016-03-14: 5 ug/min via INTRAVENOUS
  Filled 2016-03-14 (×3): qty 4

## 2016-03-14 MED ORDER — FENTANYL 2500MCG IN NS 250ML (10MCG/ML) PREMIX INFUSION
25.0000 ug/h | INTRAVENOUS | Status: DC
  Filled 2016-03-14: qty 250

## 2016-03-14 MED ORDER — BUDESONIDE 0.5 MG/2ML IN SUSP
0.5000 mg | Freq: Two times a day (BID) | RESPIRATORY_TRACT | Status: DC
  Administered 2016-03-15 – 2016-03-21 (×13): 0.5 mg via RESPIRATORY_TRACT
  Filled 2016-03-14 (×13): qty 2

## 2016-03-14 MED ORDER — LEVALBUTEROL HCL 1.25 MG/0.5ML IN NEBU: 1.2500 mg | INHALATION_SOLUTION | Freq: Four times a day (QID) | RESPIRATORY_TRACT | Status: DC

## 2016-03-14 MED ORDER — METHYLPREDNISOLONE SODIUM SUCC 40 MG IJ SOLR
40.0000 mg | Freq: Once | INTRAMUSCULAR | Status: AC
  Administered 2016-03-14: 40 mg via INTRAVENOUS

## 2016-03-14 MED ORDER — MORPHINE SULFATE (PF) 4 MG/ML IV SOLN
1.0000 mg | Freq: Once | INTRAVENOUS | Status: AC
  Administered 2016-03-14: 1 mg via INTRAVENOUS

## 2016-03-14 MED ORDER — LORAZEPAM 2 MG/ML IJ SOLN
INTRAMUSCULAR | Status: AC
  Filled 2016-03-14: qty 1

## 2016-03-14 MED ORDER — HYDROCODONE-ACETAMINOPHEN 5-325 MG PO TABS: 1.0000 | ORAL_TABLET | ORAL | Status: DC | PRN

## 2016-03-14 MED ORDER — NOREPINEPHRINE 4 MG/250ML-% IV SOLN
0.0000 ug/min | INTRAVENOUS | Status: DC
  Administered 2016-03-15: 5 ug/min via INTRAVENOUS
  Filled 2016-03-14: qty 250

## 2016-03-14 MED ORDER — MORPHINE SULFATE (PF) 4 MG/ML IV SOLN
INTRAVENOUS | Status: AC
  Administered 2016-03-14: 1 mg
  Filled 2016-03-14: qty 1

## 2016-03-14 MED ORDER — FAMOTIDINE 20 MG PO TABS: 20.0000 mg | ORAL_TABLET | Freq: Two times a day (BID) | ORAL | Status: DC

## 2016-03-14 MED ORDER — STERILE WATER FOR INJECTION IJ SOLN
INTRAMUSCULAR | Status: AC
  Filled 2016-03-14: qty 10

## 2016-03-14 MED ORDER — FAMOTIDINE 20 MG PO TABS
20.0000 mg | ORAL_TABLET | Freq: Two times a day (BID) | ORAL | Status: DC
  Administered 2016-03-14 – 2016-03-20 (×13): 20 mg via ORAL
  Filled 2016-03-14 (×13): qty 1

## 2016-03-14 MED ORDER — METHYLPREDNISOLONE SODIUM SUCC 40 MG IJ SOLR
40.0000 mg | Freq: Two times a day (BID) | INTRAMUSCULAR | Status: DC
  Administered 2016-03-14 – 2016-03-15 (×3): 40 mg via INTRAVENOUS
  Filled 2016-03-14 (×4): qty 1

## 2016-03-14 MED ORDER — NICOTINE 21 MG/24HR TD PT24: 21.0000 mg | MEDICATED_PATCH | Freq: Every day | TRANSDERMAL | Status: DC

## 2016-03-14 MED ORDER — FLUMAZENIL 0.5 MG/5ML IV SOLN
0.2000 mg | Freq: Once | INTRAVENOUS | Status: AC
  Administered 2016-03-14: 0.2 mg via INTRAVENOUS
  Filled 2016-03-14: qty 5

## 2016-03-14 NOTE — Procedures (Signed)
Central Venous Catheter Insertion Procedure Note Julie Foley [id number] 01/28/1956  Procedure: Insertion of Central Venous Catheter Indications: Assessment of intravascular volume, Drug and/or fluid administration and Frequent blood sampling  Procedure Details Consent: Risks of procedure as well as the alternatives and risks of each were explained to the (patient/caregiver).  Consent for procedure obtained. Time Out: Verified patient identification, verified procedure, site/side was marked, verified correct patient position, special equipment/implants available, medications/allergies/relevent history reviewed, required imaging and test results available.  Performed  Maximum sterile technique was used including antiseptics, cap, gloves, gown, hand hygiene, mask and sheet. Skin prep: Chlorhexidine; local anesthetic administered A antimicrobial bonded/coated triple lumen catheter was placed in the left internal jugular vein using the Seldinger technique.  Evaluation Blood flow good Complications: No apparent complications Patient did tolerate procedure well. Chest X-ray ordered to verify placement.  CXR: normal.  Left internal central placed utilizing ultrasound no complications noted during procedure.  Julie Foley, AGNP  Pulmonary/Critical Care Pager [phone] (please enter 7 [phone]) PCCM Consult Pager [phone] (please enter 7 [phone])  I was present and supervised the NP for the duration of the procedure.  Julie Foley, M.D.  03/14/2016

## 2016-03-14 NOTE — ED Triage Notes (Signed)
Pt coming from home via EMS for Respiratory distress. Pt hx of COPD. Pt hx of smoking

## 2016-03-14 NOTE — Progress Notes (Signed)
Initial Nutrition Assessment  DOCUMENTATION CODES:   Obesity unspecified  INTERVENTION:  -Dietitian Consult received via Adult Tube Feeding Protocol. Recommend changing Vital High Protein to goal of 50 ml/hr, continue PEPuP. Provides 1200 kcals, 106 of protein and 1008 mL of free water. Meets 100% estimated calorie and protein needs. Continue to assess  NUTRITION DIAGNOSIS:   Inadequate oral intake related to acute illness as evidenced by NPO status.  GOAL:   Provide needs based on ASPEN/SCCM guidelines  MONITOR:   TF tolerance, Vent status, Labs, Weight trends  REASON FOR ASSESSMENT:   Consult Enteral/tube feeding initiation and management  ASSESSMENT:    60 yo female admitted with acute on chronic respiratory failure secondary to AECOPD and asthma exacerbation requiring intubation on 12/15   Pt currently sedated on vent OG tube with tip in stomach, abdomen soft/obese  Labs: reviewed Meds: fentanyl, versed, solumedrol, senokot  Diet Order:   NPO  Skin:  Reviewed, no issues  Last BM:  no documented BM  Height:   Ht Readings from Last 1 Encounters:  02/16/16 5' 2.4" (1.585 m)    Weight:   Wt Readings from Last 1 Encounters:  03/14/16 195 lb (88.5 kg)    BMI:  Body mass index is 35.21 kg/m.  Estimated Nutritional Needs:   Kcal:  [phone] kcals  Protein:  104-130 g  Fluid:  >/= 1.5 L  EDUCATION NEEDS:   No education needs identified at this time  Romelle StarcherCate Amiri Tritch MS, RD, LDN [phone] Pager  [phone] Weekend/On-Call Pager

## 2016-03-14 NOTE — ED Notes (Signed)
Patient refused second IV. Patient removed initial IV started in left hand.

## 2016-03-14 NOTE — H&P (Signed)
[facility] [place] [facility]   PATIENT NAME: Julie Foley    MR#:  [id number]  DATE OF BIRTH:  [birth year]  DATE OF ADMISSION:  03/14/2016  PRIMARY CARE PHYSICIAN: [facility]   REQUESTING/REFERRING PHYSICIAN:  Dr. Manson Passey  CHIEF COMPLAINT:   Shortness of breath HISTORY OF PRESENT ILLNESS:  Julie Foley  is a 60 y.o. female with a known history of Chronic respiratory failure on 3 L oxygen due to COPD who presents with difficulty breathing. Patient reports over the past 2 days she has had increasing shortness of breath and wheezing. She has been in the hospital and in the emergency room several times and has left AGAINST MEDICAL ADVICE, however this time she says she will stay in the hospital. She is currently placed on BiPAP due to tachypnea and tachycardia with hypercarbia and hypoxia. Patient told Dr. Manson Passey that she had smoked cocaine prior to coming to the emergency room. Urine drug screen is pending. She denies chest pain, PND. She does endorse orthopnea but no lower extremity edema.  PAST MEDICAL HISTORY:   Past Medical History:  Diagnosis Date  . Anxiety   . Asthma   . CHF (congestive heart failure) (HCC)   . Chronic respiratory failure (HCC)   . COPD (chronic obstructive pulmonary disease) (HCC)   . Depression   . GERD (gastroesophageal reflux disease)   . HTN (hypertension)     PAST SURGICAL HISTORY:   Past Surgical History:  Procedure Laterality Date  . TUBAL LIGATION Bilateral     SOCIAL HISTORY:   Social History  Substance Use Topics  . Smoking status: Current Every Day Smoker    Packs/day: 2.00    Years: 35.00    Types: Cigarettes  . Smokeless tobacco: Never Used  . Alcohol use 0.0 oz/week     Comment: ocassional beer drinker    FAMILY HISTORY:   Family History  Problem Relation Age of Onset  . Cancer Mother   . Hypertension Mother   . Diabetes Mother   . Asthma Son     DRUG ALLERGIES:   Allergies   Allergen Reactions  . Symbicort [Budesonide-Formoterol Fumarate] Swelling and Other (See Comments)    Reaction:  Tongue swelling   . Formoterol Fumarate Swelling and Other (See Comments)    Reaction:  Tongue swelling    REVIEW OF SYSTEMS:   Review of Systems  Constitutional: Negative for chills, fever and malaise/fatigue.  HENT: Negative.  Negative for ear discharge, ear pain, hearing loss, nosebleeds and sore throat.   Eyes: Negative.  Negative for blurred vision and pain.  Respiratory: Positive for cough, shortness of breath and wheezing. Negative for hemoptysis.   Cardiovascular: Positive for orthopnea. Negative for chest pain, palpitations and leg swelling.  Gastrointestinal: Negative.  Negative for abdominal pain, blood in stool, diarrhea, nausea and vomiting.  Genitourinary: Negative.  Negative for dysuria.  Musculoskeletal: Negative.  Negative for back pain.  Skin: Negative.   Neurological: Positive for weakness. Negative for dizziness, tremors, speech change, focal weakness, seizures and headaches.  Endo/Heme/Allergies: Negative.  Does not bruise/bleed easily.  Psychiatric/Behavioral: Negative.  Negative for depression, hallucinations and suicidal ideas.    MEDICATIONS AT HOME:   Prior to Admission medications   Medication Sig Start Date End Date Taking? Authorizing Provider  albuterol (PROVENTIL HFA) 108 (90 Base) MCG/ACT inhaler Inhale 2 puffs into the lungs every 4 (four) hours as needed for wheezing or shortness of breath. 12/27/15  Yes Sharman Cheek, MD  albuterol (PROVENTIL) (2.5 MG/3ML) 0.083% nebulizer solution Take 3 mLs (2.5 mg total) by nebulization every 6 (six) hours as needed for wheezing or shortness of breath. 12/27/15  Yes Sharman CheekPhillip Stafford, MD  ALPRAZolam Prudy Feeler(XANAX) 0.5 MG tablet Take 1 tablet (0.5 mg total) by mouth 3 (three) times daily as needed for sleep or anxiety. 02/17/16 02/16/17 Yes Richard Renae GlossWieting, MD  amLODipine (NORVASC) 5 MG tablet Take 1 tablet (5  mg total) by mouth daily. 07/13/15  Yes Merwyn Katosavid B Simonds, MD  atenolol (TENORMIN) 50 MG tablet Take 1 tablet (50 mg total) by mouth daily. 07/13/15  Yes Merwyn Katosavid B Simonds, MD  azithromycin (ZITHROMAX) 250 MG tablet Take 1 mg by mouth daily.   Yes Historical Provider, MD  beclomethasone (QVAR) 80 MCG/ACT inhaler Inhale 2 puffs into the lungs 2 (two) times daily.   Yes Historical Provider, MD  EPINEPHrine 0.3 mg/0.3 mL IJ SOAJ injection Inject 0.3 mLs (0.3 mg total) into the muscle once. 08/17/15  Yes Altamese DillingVaibhavkumar Vachhani, MD  fluticasone (FLONASE) 50 MCG/ACT nasal spray Place 1 spray into both nostrils 2 (two) times daily.    Yes Historical Provider, MD  Fluticasone-Salmeterol (ADVAIR) 500-50 MCG/DOSE AEPB Inhale 1 puff into the lungs 2 (two) times daily.   Yes Historical Provider, MD  hydrochlorothiazide (HYDRODIURIL) 25 MG tablet Take 1 tablet (25 mg total) by mouth daily. 07/13/15  Yes Merwyn Katosavid B Simonds, MD  ipratropium (ATROVENT) 0.02 % nebulizer solution Take 2.5 mLs (0.5 mg total) by nebulization 4 (four) times daily. 12/08/15  Yes Wyatt Hasteavid K Hower, MD  loratadine (CLARITIN) 10 MG tablet Take 10 mg by mouth daily.   Yes Historical Provider, MD  montelukast (SINGULAIR) 10 MG tablet Take 1 tablet (10 mg total) by mouth at bedtime. 07/13/15  Yes Merwyn Katosavid B Simonds, MD  nicotine (NICODERM CQ - DOSED IN MG/24 HOURS) 21 mg/24hr patch Place 1 patch (21 mg total) onto the skin daily. 02/18/16  Yes Alford Highlandichard Wieting, MD  nitroGLYCERIN (NITROSTAT) 0.4 MG SL tablet Place 0.4 mg under the tongue every 5 (five) minutes as needed for chest pain.   Yes Historical Provider, MD  roflumilast (DALIRESP) 500 MCG TABS tablet Take 1 tablet (500 mcg total) by mouth daily. 07/13/15  Yes Merwyn Katosavid B Simonds, MD  tiotropium (SPIRIVA) 18 MCG inhalation capsule Place 18 mcg into inhaler and inhale daily.   Yes Historical Provider, MD  predniSONE (DELTASONE) 20 MG tablet Take 2 tablets (40 mg total) by mouth daily. 03/10/16   Minna AntisKevin Paduchowski, MD       VITAL SIGNS:  Blood pressure 113/74, pulse (!) 117, resp. rate (!) 9, weight 88.5 kg (195 lb), SpO2 100 %.  PHYSICAL EXAMINATION:   Physical Exam  Constitutional: She is oriented to person, place, and time. She appears distressed.  Sitting upright in distress on BiPAP  HENT:  Head: Normocephalic.  Eyes: No scleral icterus.  Neck: Normal range of motion. Neck supple. No JVD present. No tracheal deviation present.  Cardiovascular: Normal rate, regular rhythm and normal heart sounds.  Exam reveals no gallop and no friction rub.   No murmur heard. Pulmonary/Chest: She is in respiratory distress. She has wheezes. She has no rales. She exhibits no tenderness.  Abdominal: Soft. Bowel sounds are normal. She exhibits no distension and no mass. There is no tenderness. There is no rebound and no guarding.  Musculoskeletal: Normal range of motion. She exhibits no edema.  Neurological: She is alert and oriented to person, place, and time.  Skin: Skin is warm.  No rash noted. No erythema.  Psychiatric: Affect and judgment normal.      LABORATORY PANEL:   CBC  Recent Labs Lab 03/14/16 0320  WBC 7.7  HGB 13.9  HCT 41.3  PLT 291   ------------------------------------------------------------------------------------------------------------------  Chemistries   Recent Labs Lab 03/14/16 0320  NA 141  K 4.4  CL 101  CO2 33*  GLUCOSE 117*  BUN 13  CREATININE 0.81  CALCIUM 9.3  AST 29  ALT 28  ALKPHOS 49  BILITOT 0.7   ------------------------------------------------------------------------------------------------------------------  Cardiac Enzymes  Recent Labs Lab 03/14/16 0320  TROPONINI <0.03   ------------------------------------------------------------------------------------------------------------------  RADIOLOGY:    EKG:   Sinus tachycardia no ST elevation or depression  IMPRESSION AND PLAN:   60 year old female with chronic respiratory failure due  to COPD on 3 L of oxygen who has been noncompliant in the past who presents with acute on chronic hypoxic/hypercarbic respiratory failure due to acute COPD exacerbation.  1. Acute on chronic hypoxic/hypercarbic respiratory failure with acute COPD exacerbation: Patient is currently on BiPAP however high risk of intubation. Continue IV steroids, DuoNeb's, inhalers and Levaquin Intensivist consult  2. Acute COPD exacerbation: Plan as outlined above. 3. Tobacco dependence: Patient highly encouraged to stop smoking. Patient counseled for 3 minutes. Patient is not ready to quit smoking.  4. Essential hypertension: Continue Norvasc and atenolol, HCTZ  5. Anxiety: Continue Xanax when necessary  All the records are reviewed and case discussed with ED provider. Management plans discussed with the patient and she is in agreement  CODE STATUS: FULL  CRITICAL CARETOTAL TIME TAKING CARE OF THIS PATIENT: 50 minutes.  Patient at high risk of respiratory decompensation.  Gregoire Bennis M.D on 03/14/2016 at 11:35 AM  Between 7am to 6pm - Pager - [phone]  After 6pm go to [email or web address] - Social research officer, governmentpassword EPAS [facility]  [facility]  Office  [phone]  CC: Primary care physician; [facility] [employer]

## 2016-03-14 NOTE — Progress Notes (Signed)
*  PRELIMINARY RESULTS* Echocardiogram 2D Echocardiogram has been performed.  Cristela BlueHege, Schuyler Olden 03/14/2016, 2:34 PM

## 2016-03-14 NOTE — Care Management Note (Signed)
Case Management Note  Patient Details  Name: Julie Foley MRN: [id number] Date of Birth: December 13, 1955  Subjective/Objective:    RNCM consult received for home health services. She is currently intubated. On precedex and fentanyl gtt. PMH COPD with  noncompliance and leaving hospital  AMA. UDS positive for cocaine. On O2 at 3L Chronically. [facility]                Action/Plan: Will follow progression  Expected Discharge Date:                  Expected Discharge Plan:     In-House Referral:     Discharge planning Services  CM Consult  Post Acute Care Choice:    Choice offered to:     DME Arranged:    DME Agency:     HH Arranged:    HH Agency:     Status of Service:  In process, will continue to follow  If discussed at Long Length of Stay Meetings, dates discussed:    Additional Comments:  Marily MemosLisa M Tank Difiore, RN 03/14/2016, 1:49 PM

## 2016-03-14 NOTE — ED Provider Notes (Addendum)
[facility] Emergency Department Provider Note  Time seen 3:15 AM  I have reviewed the triage vital signs and the nursing notes.   HISTORY  Chief Complaint Respiratory Distress   HPI Julie Foley is a 60 y.o. female with below list of chronic medical conditions including COPD presents to the emergency department with acute onset of dyspnea this morning. Patient has been seen multiple times in the emergency department for rest or distress and has left the ED multiple times AGAINST MEDICAL ADVICE. Patient presents tonight tachycardic tachypnea heart rate 126 rest or rate 32 hypertensive systolic blood pressure 174. Patient admits to smoking Crack Cocaine before onset of symptoms   Past Medical History:  Diagnosis Date  . Anxiety   . Asthma   . CHF (congestive heart failure) (HCC)   . Chronic respiratory failure (HCC)   . COPD (chronic obstructive pulmonary disease) (HCC)   . Depression   . GERD (gastroesophageal reflux disease)   . HTN (hypertension)     Patient Active Problem List   Diagnosis Date Noted  . Respiratory distress 12/08/2015  . Acute on chronic respiratory failure with hypoxemia (HCC) 08/17/2015  . Acute respiratory failure (HCC) 05/26/2015  . Sepsis (HCC) 02/16/2015  . COPD with acute exacerbation (HCC) 01/25/2015  . Acute respiratory failure with hypoxemia (HCC) 10/07/2014  . Anxiety 10/07/2014  . HTN (hypertension) 09/23/2014  . GERD (gastroesophageal reflux disease) 09/23/2014  . CHF (congestive heart failure) (HCC) 09/23/2014  . COPD exacerbation (HCC) 08/11/2014    Past Surgical History:  Procedure Laterality Date  . TUBAL LIGATION Bilateral     Prior to Admission medications   Medication Sig Start Date End Date Taking? Authorizing Provider  albuterol (PROVENTIL HFA) 108 (90 Base) MCG/ACT inhaler Inhale 2 puffs into the lungs every 4 (four) hours as needed for wheezing or shortness of breath. 12/27/15   Sharman Cheek,  MD  albuterol (PROVENTIL) (2.5 MG/3ML) 0.083% nebulizer solution Take 3 mLs (2.5 mg total) by nebulization every 6 (six) hours as needed for wheezing or shortness of breath. 12/27/15   Sharman Cheek, MD  ALPRAZolam Prudy Feeler) 0.5 MG tablet Take 1 tablet (0.5 mg total) by mouth 3 (three) times daily as needed for sleep or anxiety. 02/17/16 02/16/17  Alford Highland, MD  amLODipine (NORVASC) 5 MG tablet Take 1 tablet (5 mg total) by mouth daily. 07/13/15   Merwyn Katos, MD  atenolol (TENORMIN) 50 MG tablet Take 1 tablet (50 mg total) by mouth daily. 07/13/15   Merwyn Katos, MD  azithromycin (ZITHROMAX) 250 MG tablet Take 1 mg by mouth daily.    Historical Provider, MD  beclomethasone (QVAR) 80 MCG/ACT inhaler Inhale 2 puffs into the lungs 2 (two) times daily.    Historical Provider, MD  EPINEPHrine 0.3 mg/0.3 mL IJ SOAJ injection Inject 0.3 mLs (0.3 mg total) into the muscle once. 08/17/15   Altamese Dilling, MD  fluticasone (FLONASE) 50 MCG/ACT nasal spray Place 1 spray into both nostrils 2 (two) times daily.     Historical Provider, MD  Fluticasone-Salmeterol (ADVAIR) 500-50 MCG/DOSE AEPB Inhale 1 puff into the lungs 2 (two) times daily.    Historical Provider, MD  hydrochlorothiazide (HYDRODIURIL) 25 MG tablet Take 1 tablet (25 mg total) by mouth daily. 07/13/15   Merwyn Katos, MD  ipratropium (ATROVENT) 0.02 % nebulizer solution Take 2.5 mLs (0.5 mg total) by nebulization 4 (four) times daily. 12/08/15   Wyatt Haste, MD  levofloxacin (LEVAQUIN) 500 MG tablet  Take 1 tablet (500 mg total) by mouth daily. 02/17/16   Alford Highland, MD  loratadine (CLARITIN) 10 MG tablet Take 10 mg by mouth daily.    Historical Provider, MD  montelukast (SINGULAIR) 10 MG tablet Take 1 tablet (10 mg total) by mouth at bedtime. 07/13/15   Merwyn Katos, MD  nicotine (NICODERM CQ - DOSED IN MG/24 HOURS) 21 mg/24hr patch Place 1 patch (21 mg total) onto the skin daily. 02/18/16   Alford Highland, MD    nitroGLYCERIN (NITROSTAT) 0.4 MG SL tablet Place 0.4 mg under the tongue every 5 (five) minutes as needed for chest pain.    Historical Provider, MD  predniSONE (DELTASONE) 20 MG tablet Take 2 tablets (40 mg total) by mouth daily. 03/10/16   [name], MD  roflumilast (DALIRESP) 500 MCG TABS tablet Take 1 tablet (500 mcg total) by mouth daily. 07/13/15   Merwyn Katos, MD  tiotropium (SPIRIVA) 18 MCG inhalation capsule Place 18 mcg into inhaler and inhale daily.    Historical Provider, MD    Allergies Symbicort [budesonide-formoterol fumarate] and Formoterol fumarate  Family History  Problem Relation Age of Onset  . Cancer Mother   . Hypertension Mother   . Diabetes Mother   . Asthma Son     Social History Social History  Substance Use Topics  . Smoking status: Current Every Day Smoker    Packs/day: 2.00    Years: 35.00    Types: Cigarettes  . Smokeless tobacco: Never Used  . Alcohol use 0.0 oz/week     Comment: ocassional beer drinker    Review of Systems Constitutional: No fever/chills Eyes: No visual changes. ENT: No sore throat. Cardiovascular: Denies chest pain. Respiratory: Denies shortness of breath. Gastrointestinal: No abdominal pain.  No nausea, no vomiting.  No diarrhea.  No constipation. Genitourinary: Negative for dysuria. Musculoskeletal: Negative for back pain. Skin: Negative for rash. Neurological: Negative for headaches, focal weakness or numbness.  10-point ROS otherwise negative.  ____________________________________________   PHYSICAL EXAM:  VITAL SIGNS: ED Triage Vitals  Enc Vitals Group     BP 03/14/16 0324 (!) 156/111     Pulse Rate 03/14/16 0324 (!) 134     Resp --      Temp --      Temp src --      SpO2 03/14/16 0324 96 %     Weight 03/14/16 0331 195 lb (88.5 kg)     Height --      Head Circumference --      Peak Flow --      Pain Score 03/14/16 0331 8     Pain Loc --      Pain Edu? --      Excl. in GC? --      Constitutional: Alert and oriented. Apparent respiratory distress Eyes: Conjunctivae are normal. PERRL. EOMI. Head: Atraumatic. Ears:  Healthy appearing ear canals and TMs bilaterally Nose: No congestion/rhinnorhea. Mouth/Throat: Mucous membranes are moist.  Oropharynx non-erythematous. Neck: No stridor.  No meningeal signs.  Cardiovascular: Tachycardia, regular rhythm. Good peripheral circulation. Grossly normal heart sounds. Respiratory: Tachypnea, accessory respiratory muscle use, diffuse rhonchi Gastrointestinal: Soft and nontender. No distention.   Musculoskeletal: No lower extremity tenderness nor edema. No gross deformities of extremities. Neurologic:  Normal speech and language. No gross focal neurologic deficits are appreciated.  Skin:  Skin is warm, dry and intact. No rash noted. Psychiatric: Anxious affect  ____________________________________________   LABS (all labs ordered are listed, but only abnormal  results are displayed)  Labs Reviewed  COMPREHENSIVE METABOLIC PANEL - Abnormal; Notable for the following:       Result Value   CO2 33 (*)    Glucose, Bld 117 (*)    All other components within normal limits  BLOOD GAS, VENOUS - Abnormal; Notable for the following:    pCO2, Ven 84 (*)    pO2, Ven 56.0 (*)    Bicarbonate 38.6 (*)    Acid-Base Excess 8.1 (*)    All other components within normal limits  CBC  TROPONIN I  LACTIC ACID, PLASMA  URINE DRUG SCREEN, QUALITATIVE ([facility] ONLY)  LACTIC ACID, PLASMA   ____________________________________________  EKG  ED ECG REPORT I, Bowman N Ilaria Much, the attending physician, personally viewed and interpreted this ECG.   Date: 03/14/2016  EKG Time: 3:24AM  Rate: 130  Rhythm: Sinus tachycardia  Axis: Normal  Intervals:Normal  ST&T Change: None  ____________________________________________  RADIOLOGY I, Cornish Dewayne ShorterN Donley Harland, personally viewed and evaluated these images (plain radiographs) as part of my medical  decision making, as well as reviewing the written report by the radiologist.    Dg Chest Portable 1 View  Result Date: 03/14/2016 CLINICAL DATA:  60 y/o  F; respiratory distress.  History of COPD. EXAM: PORTABLE CHEST 1 VIEW COMPARISON:  03/10/2016 chest radiograph. FINDINGS: The heart size and mediastinal contours are within normal limits and stable. Both lungs are clear. Emphysematous changes in the apices. The visualized skeletal structures are unremarkable. IMPRESSION: No active disease. Electronically Signed   By: Mitzi HansenLance  Furusawa-Stratton M.D.   On: 03/14/2016 03:47      Procedures   Critical Care performed: CRITICAL CARE Performed by: Darci CurrentANDOLPH N Devera Englander   Total critical care time: 60 minutes  Critical care time was exclusive of separately billable procedures and treating other patients.  Critical care was necessary to treat or prevent imminent or life-threatening deterioration.  Critical care was time spent personally by me on the following activities: development of treatment plan with patient and/or surrogate as well as nursing, discussions with consultants, evaluation of patient's response to treatment, examination of patient, obtaining history from patient or surrogate, ordering and performing treatments and interventions, ordering and review of laboratory studies, ordering and review of radiographic studies, pulse oximetry and re-evaluation of patient's condition. ____________________________________________   INITIAL IMPRESSION / ASSESSMENT AND PLAN / ED COURSE  Pertinent labs & imaging results that were available during my care of the patient were reviewed by me and considered in my medical decision making (see chart for details).  Patient given Ativan 1mg , Bipap applied. Patient she states symptoms improved. I advised patient to stay for hospital admission however the patient adamantly refused. I informed the patient of potential catastrophic results of her decisions  however she adamantly stated that she would not remain in the hospital.  After signing AMA paperwork. BiPAP was removed and patient was anticipating leaving respiratory distress and reoccurred. Patient in tripod position speaking 2 word phrases. Patient is in a greenstick be admitted at this time. As such patient discussed with Dr. Sheryle Haildiamond for hospital admission for further evaluation and management.   Clinical Course     ____________________________________________  FINAL CLINICAL IMPRESSION(S) / ED DIAGNOSES  Final diagnoses:  Respiratory distress  Acute respiratory distress  Cocaine abuse     MEDICATIONS GIVEN DURING THIS VISIT:  Medications  ipratropium-albuterol (DUONEB) 0.5-2.5 (3) MG/3ML nebulizer solution 3 mL (3 mLs Nebulization Given 03/14/16 0325)  ipratropium-albuterol (DUONEB) 0.5-2.5 (3) MG/3ML nebulizer solution 3 mL (  3 mLs Nebulization Given 03/14/16 0325)  LORazepam (ATIVAN) injection 1 mg (1 mg Intravenous Given 03/14/16 0325)     NEW OUTPATIENT MEDICATIONS STARTED DURING THIS VISIT:  New Prescriptions   No medications on file    Modified Medications   No medications on file    Discontinued Medications   No medications on file     Note:  This document was prepared using Dragon voice recognition software and may include unintentional dictation errors.    Darci Currentandolph N Delorice Bannister, MD 03/14/16 [phone]    Darci Currentandolph N Perlie Stene, MD 03/14/16 [phone]

## 2016-03-14 NOTE — Consult Note (Signed)
PULMONARY / CRITICAL CARE MEDICINE   Name: Julie Foley MRN: [id number] DOB: 01/12/1956    ADMISSION DATE:  03/14/2016 CONSULTATION DATE:  03/14/2016  REFERRING MD:  Dr. Juliene Pina  CHIEF COMPLAINT:  Respiratory distress   HISTORY OF PRESENT ILLNESS:   This is a 60 yo female with a PMH of HTN, GERD, Severe COPD, Chronic respiratory failure, Asthma, Anxiety, Diastolic CHF.  She presented to [facility] ER on 12/15 with acute onset of dyspnea 12/15.  She has been seen multiple times in the emergency department for respiratory distress and has left AMA multiple times.  Upon this presentation to the ER she was tachycardic hr 126, tachypneic, and hypertensive systolic bp 174.  Per ER notes she did admit to smoking crack cocaine before onset of symptoms. She was admitted to ICU due to acute on chronic hypercapnic respiratory failure secondary to AECOPD and Asthma exacerbation requiring Bipap, however her respiratory status continued to decline and she was subsequently intubated.  PCCM contacted 12/15 for further management due to worsening acute on chronic hypercapnic respiratory failure requiring mechanical intubation.  PAST MEDICAL HISTORY :  She  has a past medical history of Anxiety; Asthma; CHF (congestive heart failure) (HCC); Chronic respiratory failure (HCC); COPD (chronic obstructive pulmonary disease) (HCC); Depression; GERD (gastroesophageal reflux disease); and HTN (hypertension).  PAST SURGICAL HISTORY: She  has a past surgical history that includes Tubal ligation (Bilateral).  Allergies  Allergen Reactions  . Symbicort [Budesonide-Formoterol Fumarate] Swelling and Other (See Comments)    Reaction:  Tongue swelling   . Formoterol Fumarate Swelling and Other (See Comments)    Reaction:  Tongue swelling    No current facility-administered medications on file prior to encounter.    Current Outpatient Prescriptions on File Prior to Encounter  Medication Sig  . albuterol (PROVENTIL HFA) 108  (90 Base) MCG/ACT inhaler Inhale 2 puffs into the lungs every 4 (four) hours as needed for wheezing or shortness of breath.  Marland Kitchen albuterol (PROVENTIL) (2.5 MG/3ML) 0.083% nebulizer solution Take 3 mLs (2.5 mg total) by nebulization every 6 (six) hours as needed for wheezing or shortness of breath.  . ALPRAZolam (XANAX) 0.5 MG tablet Take 1 tablet (0.5 mg total) by mouth 3 (three) times daily as needed for sleep or anxiety.  Marland Kitchen amLODipine (NORVASC) 5 MG tablet Take 1 tablet (5 mg total) by mouth daily.  Marland Kitchen atenolol (TENORMIN) 50 MG tablet Take 1 tablet (50 mg total) by mouth daily.  Marland Kitchen azithromycin (ZITHROMAX) 250 MG tablet Take 1 mg by mouth daily.  . beclomethasone (QVAR) 80 MCG/ACT inhaler Inhale 2 puffs into the lungs 2 (two) times daily.  Marland Kitchen EPINEPHrine 0.3 mg/0.3 mL IJ SOAJ injection Inject 0.3 mLs (0.3 mg total) into the muscle once.  . fluticasone (FLONASE) 50 MCG/ACT nasal spray Place 1 spray into both nostrils 2 (two) times daily.   . Fluticasone-Salmeterol (ADVAIR) 500-50 MCG/DOSE AEPB Inhale 1 puff into the lungs 2 (two) times daily.  . hydrochlorothiazide (HYDRODIURIL) 25 MG tablet Take 1 tablet (25 mg total) by mouth daily.  Marland Kitchen ipratropium (ATROVENT) 0.02 % nebulizer solution Take 2.5 mLs (0.5 mg total) by nebulization 4 (four) times daily.  Marland Kitchen loratadine (CLARITIN) 10 MG tablet Take 10 mg by mouth daily.  . montelukast (SINGULAIR) 10 MG tablet Take 1 tablet (10 mg total) by mouth at bedtime.  . nicotine (NICODERM CQ - DOSED IN MG/24 HOURS) 21 mg/24hr patch Place 1 patch (21 mg total) onto the skin daily.  . nitroGLYCERIN (NITROSTAT) 0.4  MG SL tablet Place 0.4 mg under the tongue every 5 (five) minutes as needed for chest pain.  . roflumilast (DALIRESP) 500 MCG TABS tablet Take 1 tablet (500 mcg total) by mouth daily.  Marland Kitchen. tiotropium (SPIRIVA) 18 MCG inhalation capsule Place 18 mcg into inhaler and inhale daily.  . predniSONE (DELTASONE) 20 MG tablet Take 2 tablets (40 mg total) by mouth daily.     FAMILY HISTORY:  Her indicated that the status of her mother is unknown. She indicated that her father is alive. She indicated that the status of her son is unknown.    SOCIAL HISTORY: She  reports that she has been smoking Cigarettes.  She has a 70.00 pack-year smoking history. She has never used smokeless tobacco. She reports that she drinks alcohol. She reports that she does not use drugs.  REVIEW OF SYSTEMS:   Unable to assess pt intubated   SUBJECTIVE:  Pt currently intubated   VITAL SIGNS: BP 113/74   Pulse (!) 117   Resp (!) 9   Wt 195 lb (88.5 kg)   SpO2 100%   BMI 35.21 kg/m   HEMODYNAMICS:    VENTILATOR SETTINGS:    INTAKE / OUTPUT: No intake/output data recorded.  PHYSICAL EXAMINATION: General:  Acutely ill appearing Caucasian female Neuro:  Sedated not following commands, moves all extremities, PERRL HEENT:  Supple, JVD present  Cardiovascular:  Sinus tachycardia, s1s2, no M/R/G Lungs:  Severely diminished with inspiratory wheezes, even, non labored mechanically intubated  Abdomen: +BS x4, soft, non tender, non distended Musculoskeletal:  Normal bulk and tone, no edema Skin: Intact no rashes or lesions  LABS:  BMET  Recent Labs Lab 03/10/16 0414 03/14/16 0320  NA 144 141  K 4.4 4.4  CL 103 101  CO2 36* 33*  BUN 8 13  CREATININE 0.83 0.81  GLUCOSE 127* 117*    Electrolytes  Recent Labs Lab 03/10/16 0414 03/14/16 0320  CALCIUM 9.3 9.3    CBC  Recent Labs Lab 03/10/16 0414 03/14/16 0320  WBC 9.8 7.7  HGB 13.5 13.9  HCT 40.7 41.3  PLT 320 291    Coag's No results for input(s): APTT, INR in the last 168 hours.  Sepsis Markers  Recent Labs Lab 03/14/16 0321  LATICACIDVEN 0.7    ABG No results for input(s): PHART, PCO2ART, PO2ART in the last 168 hours.  Liver Enzymes  Recent Labs Lab 03/14/16 0320  AST 29  ALT 28  ALKPHOS 49  BILITOT 0.7  ALBUMIN 4.6    Cardiac Enzymes  Recent Labs Lab  03/10/16 0414 03/14/16 0320  TROPONINI <0.03 <0.03    Glucose No results for input(s): GLUCAP in the last 168 hours.  Imaging Dg Chest Portable 1 View  Result Date: 03/14/2016 CLINICAL DATA:  60 y/o  F; respiratory distress.  History of COPD. EXAM: PORTABLE CHEST 1 VIEW COMPARISON:  03/10/2016 chest radiograph. FINDINGS: The heart size and mediastinal contours are within normal limits and stable. Both lungs are clear. Emphysematous changes in the apices. The visualized skeletal structures are unremarkable. IMPRESSION: No active disease. Electronically Signed   By: Mitzi HansenLance  Furusawa-Stratton M.D.   On: 03/14/2016 03:47   STUDIES:  Echo 12/15>>  CULTURES: None  ANTIBIOTICS: Ceftriaxone 12/15>>  SIGNIFICANT EVENTS: 12/15-Pt [facility] ICU due to acute on chronic hypercapnic respiratory failure secondary to AECOPD and Asthma Exacerbation 12/15-PCCM consulted pt mechanically intubated due to worsening respiratory failure   LINES/TUBES: ETT 12/15>> Left internal jugular CVL 12/15>>  ASSESSMENT /  PLAN:  PULMONARY A: Acute on chronic hypercapnic respiratory failure secondary to AECOPD and Asthma Exacerbation Mechanical Ventilation P:   Full vent support wean as tolerated VAP bundle Aggressive Bronchodilator therapy IV steroids  Repeat ABG today 12/15 CXR in am 12/16  CARDIOVASCULAR A:  Hypotension R on T Phenomenon likely secondary to acute respiratory failure Sinus tachycardia  Hx: Diastolic CHF and HTN P:  Stat EKG 12/15 Trend troponin's Echo pending Hold outpatient atenolol, amlodipine, and hydrochlorothiazide Prn Levophed gtt to maintain map >65  RENAL A:   No acute issues P:   Trend BMP's  Replace electrolytes as indicated  Monitor uop  GASTROINTESTINAL A:   Hx: GERD P:   Start tube feeds Pepcid for PUD prophylaxis   HEMATOLOGIC A:   No acute issues P:  Lovenox for VTE prophylaxis Trend CBC's Monitor for s/sx of bleeding Transfuse for  hgb <7  INFECTIOUS A:   No acute issues P:   Trend WBC's and monitor fever cure Trend lactic acid and PCT's Continue abx as listed above Will obtain cultures if pt becomes febrile or develops leukocytosis   ENDOCRINE A:   No acute issues   P:   CBG's q4hrs  NEUROLOGIC A:   Hx: Anxiety P:   RASS goal: 0 to -1 Fentanyl gtt, prn Versed and Fentanyl to maintain RASS goal and for pain management WUA daily Promote family presence at bedside     FAMILY  - Updates: Pts daughter Drue StagerJaneese Crisp notified via telephone about pts deterioration and requirement of mechanical ventilation all questions answered she stated she would arrive at the hospital today 12/15.  - Inter-disciplinary family meet or Palliative Care meeting due by:  03/21/2016  [name], Juel BurrowAGNP  Pulmonary/Critical Care Pager [phone] (please enter 7 digits) PCCM Consult Pager [phone] (please enter 7 digits)  Patient was seen, examined the nurse practitioner, I agree with the findings, assessment and plan as amended above. This is a 60 year old female with recurrent admissions for drug abuse. She comes in at this time with acute wheezing and respiratory distress with COPD exacerbation. She was seen in the intensive care unit with increasing respirations and bilateral expiratory wheeze, which could not be improved with nebulizers or BiPAP, thus the patient had to be acutely intubated. We'll maintain on ventilator, continue IV steroids, continue nebulizer treatments.  Wells Guiles-Deep Liya Strollo, M.D. 03/14/2016  Critical Care Attestation.  I have personally obtained a history, examined the patient, evaluated laboratory and imaging results, formulated the assessment and plan and placed orders. The Patient requires high complexity decision making for assessment and support, frequent evaluation and titration of therapies, application of advanced monitoring technologies and extensive interpretation of multiple databases.  The patient has critical illness that could lead imminently to failure of 1 or more organ systems and requires the highest level of physician preparedness to intervene.  Critical Care Time devoted to patient care services described in this note is 45 minutes and is exclusive of time spent in procedures.

## 2016-03-14 NOTE — Procedures (Signed)
Intubation Procedure Note Julie BunSylvia L Foley [id number] 01/22/1956  Procedure: Intubation Indications: Respiratory insufficiency  Procedure Details Consent: Risks of procedure as well as the alternatives and risks of each were explained to the (patient/caregiver).  Consent for procedure obtained. Time Out: Verified patient identification, verified procedure, site/side was marked, verified correct patient position, special equipment/implants available, medications/allergies/relevent history reviewed, required imaging and test results available.  Performed  Maximum sterile technique was used including antiseptics, cap, gloves, gown, hand hygiene, mask and sheet.  Miller    Evaluation Hemodynamic Status: Transient hypotension treated with fluid; O2 sats: stable throughout Patient's Current Condition: stable Complications: No apparent complications Patient did tolerate procedure well. Chest X-ray ordered to verify placement.  CXR: tube position acceptable.   Pt mechanically intubated supervised by intensivist Dr. Camie Patienceamachandran   Dana Blakeney, AGNP  Pulmonary/Critical Care Pager [phone] (please enter 7 digits) PCCM Consult Pager [phone] (please enter 7 digits)   I was present and supervised the NP for the duration of the procedure.  Wells Guiles-Deep Zakeya Junker, M.D.  03/14/2016

## 2016-03-14 NOTE — Progress Notes (Signed)
MEDICATION RELATED CONSULT NOTE  Pharmacy Consult for electrolyte replacement and constipation prevention.   Pharmacy consulted for electrolyte replacement and constipation prevention for 60 yo female intubated ICU patient. Patient is currently requiring fentanyl drip.   Plan:  1. Electrolytes: electrolytes are WNL. Will recheck with am labs.   2. Constipation prevention: will start patient on senna/docusate 1 tab BID.   Allergies  Allergen Reactions  . Symbicort [Budesonide-Formoterol Fumarate] Swelling and Other (See Comments)    Reaction:  Tongue swelling   . Formoterol Fumarate Swelling and Other (See Comments)    Reaction:  Tongue swelling    Patient Measurements: Height: 5\' 2"  (157.5 cm) Weight: 180 lb 1.9 oz (81.7 kg) IBW/kg (Calculated) : 50.1  Vital Signs: Temp: 100.3 F (37.9 C) (12/15 1951) Temp Source: Oral (12/15 1951) BP: 116/87 (12/15 2000) Pulse Rate: 110 (12/15 2000) Intake/Output from previous day: No intake/output data recorded. Intake/Output from this shift: Total I/O In: -  Out: 80 [Urine:80]  Labs:  Recent Labs  03/14/16 0320 03/14/16 1213  WBC 7.7 6.7  HGB 13.9 12.3  HCT 41.3 37.3  PLT 291 273  APTT  --  24  CREATININE 0.81 0.94  MG  --  2.1  PHOS  --  6.3*  ALBUMIN 4.6 4.1  PROT 7.4 6.5  AST 29 19  ALT 28 26  ALKPHOS 49 43  BILITOT 0.7 0.7   Estimated Creatinine Clearance: 64.6 mL/min (by C-G formula based on SCr of 0.94 mg/dL).   Pharmacy will continue to monitor and adjust per consult.   Saren Corkern L 03/14/2016,8:41 PM

## 2016-03-14 NOTE — ED Notes (Signed)
Pt signed AMA paperwork

## 2016-03-15 ENCOUNTER — Inpatient Hospital Stay: Payer: Medicaid Other

## 2016-03-15 DIAGNOSIS — J9602 Acute respiratory failure with hypercapnia: Secondary | ICD-10-CM

## 2016-03-15 DIAGNOSIS — J9601 Acute respiratory failure with hypoxia: Secondary | ICD-10-CM

## 2016-03-15 LAB — CBC
HEMATOCRIT: 33.3 % — AB (ref 35.0–47.0)
HEMOGLOBIN: 11.1 g/dL — AB (ref 12.0–16.0)
MCH: 33 pg (ref 26.0–34.0)
MCHC: 33.4 g/dL (ref 32.0–36.0)
MCV: 98.8 fL (ref 80.0–100.0)
Platelets: 228 10*3/uL (ref 150–440)
RBC: 3.37 MIL/uL — ABNORMAL LOW (ref 3.80–5.20)
RDW: 13 % (ref 11.5–14.5)
WBC: 7 10*3/uL (ref 3.6–11.0)

## 2016-03-15 LAB — URINE DRUG SCREEN, QUALITATIVE (ARMC ONLY)
Amphetamines, Ur Screen: NOT DETECTED
BARBITURATES, UR SCREEN: NOT DETECTED
BENZODIAZEPINE, UR SCRN: POSITIVE — AB
CANNABINOID 50 NG, UR ~~LOC~~: NOT DETECTED
Cocaine Metabolite,Ur ~~LOC~~: POSITIVE — AB
MDMA (ECSTASY) UR SCREEN: NOT DETECTED
Methadone Scn, Ur: NOT DETECTED
Opiate, Ur Screen: NOT DETECTED
PHENCYCLIDINE (PCP) UR S: NOT DETECTED
TRICYCLIC, UR SCREEN: NOT DETECTED

## 2016-03-15 LAB — PHOSPHORUS
PHOSPHORUS: 3 mg/dL (ref 2.5–4.6)
Phosphorus: 3.3 mg/dL (ref 2.5–4.6)

## 2016-03-15 LAB — MAGNESIUM
MAGNESIUM: 2.2 mg/dL (ref 1.7–2.4)
Magnesium: 2.4 mg/dL (ref 1.7–2.4)

## 2016-03-15 LAB — BASIC METABOLIC PANEL
Anion gap: 7 (ref 5–15)
BUN: 21 mg/dL — ABNORMAL HIGH (ref 6–20)
CHLORIDE: 101 mmol/L (ref 101–111)
CO2: 31 mmol/L (ref 22–32)
Calcium: 8.5 mg/dL — ABNORMAL LOW (ref 8.9–10.3)
Creatinine, Ser: 0.86 mg/dL (ref 0.44–1.00)
GFR calc Af Amer: >60 mL/min (ref >60)
GLUCOSE: 143 mg/dL — AB (ref 65–99)
POTASSIUM: 4.7 mmol/L (ref 3.5–5.1)
SODIUM: 139 mmol/L (ref 135–145)

## 2016-03-15 LAB — GLUCOSE, CAPILLARY
GLUCOSE-CAPILLARY: 156 mg/dL — AB (ref 65–99)
GLUCOSE-CAPILLARY: 177 mg/dL — AB (ref 65–99)
Glucose-Capillary: 111 mg/dL — ABNORMAL HIGH (ref 65–99)
Glucose-Capillary: 122 mg/dL — ABNORMAL HIGH (ref 65–99)
Glucose-Capillary: 123 mg/dL — ABNORMAL HIGH (ref 65–99)
Glucose-Capillary: 127 mg/dL — ABNORMAL HIGH (ref 65–99)
Glucose-Capillary: 138 mg/dL — ABNORMAL HIGH (ref 65–99)

## 2016-03-15 LAB — PROCALCITONIN: Procalcitonin: <0.1 ng/mL

## 2016-03-15 MED ORDER — ORAL CARE MOUTH RINSE
15.0000 mL | OROMUCOSAL | Status: DC
  Administered 2016-03-15 – 2016-03-22 (×67): 15 mL via OROMUCOSAL

## 2016-03-15 MED ORDER — CHLORHEXIDINE GLUCONATE 0.12% ORAL RINSE (MEDLINE KIT)
15.0000 mL | Freq: Two times a day (BID) | OROMUCOSAL | Status: DC
  Administered 2016-03-15 – 2016-03-22 (×15): 15 mL via OROMUCOSAL

## 2016-03-15 NOTE — Progress Notes (Signed)
MEDICATION RELATED CONSULT NOTE  Pharmacy Consult for electrolyte replacement and constipation prevention.   Pharmacy consulted for electrolyte replacement and constipation prevention for 60 yo female intubated ICU patient. Patient is currently requiring fentanyl drip.   Plan:  1. Electrolytes: electrolytes are WNL. Will recheck on 12/18   2. Constipation prevention: will continue patient on senna/docusate 1 tab BID.   Allergies  Allergen Reactions  . Symbicort [Budesonide-Formoterol Fumarate] Swelling and Other (See Comments)    Reaction:  Tongue swelling   . Formoterol Fumarate Swelling and Other (See Comments)    Reaction:  Tongue swelling    Patient Measurements: Height: 5\' 2"  (157.5 cm) Weight: 176 lb 9.4 oz (80.1 kg) IBW/kg (Calculated) : 50.1  Vital Signs: Temp: 99.8 F (37.7 C) (12/16 0337) Temp Source: Oral (12/16 0337) BP: 96/63 (12/16 0600) Pulse Rate: 96 (12/16 0600) Intake/Output from previous day: 12/15 0701 - 12/16 0700 In: 1755.9 [I.V.:1060.9; NG/GT:645; IV Piggyback:50] Out: 425 [Urine:425] Intake/Output from this shift: No intake/output data recorded.  Labs:  Recent Labs  03/14/16 0320 03/14/16 1213 03/14/16 2042 03/15/16 0451  WBC 7.7 6.7  --  7.0  HGB 13.9 12.3  --  11.1*  HCT 41.3 37.3  --  33.3*  PLT 291 273  --  228  APTT  --  24  --   --   CREATININE 0.81 0.94  --  0.86  MG  --  2.1 2.0 2.2  PHOS  --  6.3* 3.1 3.0  ALBUMIN 4.6 4.1  --   --   PROT 7.4 6.5  --   --   AST 29 19  --   --   ALT 28 26  --   --   ALKPHOS 49 43  --   --   BILITOT 0.7 0.7  --   --    Estimated Creatinine Clearance: 69.9 mL/min (by C-G formula based on SCr of 0.86 mg/dL).   Pharmacy will continue to monitor and adjust per consult.   Aliya Sol D 03/15/2016,8:14 AM

## 2016-03-15 NOTE — Consult Note (Signed)
PULMONARY / CRITICAL CARE MEDICINE   Name: Julie Foley MRN: [id number] DOB: 10/12/1955    ADMISSION DATE:  03/14/2016   SUBJECTIVE:  Pt currently intubated   REVIEW OF SYSTEMS:   Unable to assess pt intubated   VITAL SIGNS: BP 107/67   Pulse (!) 102   Temp 99.8 F (37.7 C) (Oral)   Resp (!) 21   Ht 5\' 2"  (1.575 m)   Wt 80.1 kg (176 lb 9.4 oz)   SpO2 94%   BMI 32.30 kg/m   HEMODYNAMICS:    VENTILATOR SETTINGS: Vent Mode: PRVC FiO2 (%):  [30 %-40 %] 30 % Set Rate:  [16 bmp-22 bmp] 22 bmp Vt Set:  [400 mL] 400 mL PEEP:  [5 cmH20] 5 cmH20 Plateau Pressure:  [25 cmH20] 25 cmH20  INTAKE / OUTPUT: I/O last 3 completed shifts: In: 1755.9 [I.V.:1060.9; NG/GT:645; IV Piggyback:50] Out: 425 [Urine:425]  PHYSICAL EXAMINATION: General:  Acutely ill appearing Caucasian female Neuro:  Sedated not following commands, moves all extremities, PERRL HEENT:  Supple, JVD present  Cardiovascular:  Sinus tachycardia, s1s2, no M/R/G Lungs:  Severely diminished with inspiratory wheezes, even, non labored mechanically intubated  Abdomen: +BS x4, soft, non tender, non distended Musculoskeletal:  Normal bulk and tone, no edema Skin: Intact no rashes or lesions  LABS:  BMET  Recent Labs Lab 03/14/16 0320 03/14/16 1213 03/15/16 0451  NA 141 140 139  K 4.4 4.3 4.7  CL 101 101 101  CO2 33* 34* 31  BUN 13 16 21*  CREATININE 0.81 0.94 0.86  GLUCOSE 117* 182* 143*    Electrolytes  Recent Labs Lab 03/14/16 0320 03/14/16 1213 03/14/16 2042 03/15/16 0451  CALCIUM 9.3 8.6*  --  8.5*  MG  --  2.1 2.0 2.2  PHOS  --  6.3* 3.1 3.0    CBC  Recent Labs Lab 03/14/16 0320 03/14/16 1213 03/15/16 0451  WBC 7.7 6.7 7.0  HGB 13.9 12.3 11.1*  HCT 41.3 37.3 33.3*  PLT 291 273 228    Coag's  Recent Labs Lab 03/14/16 1213  APTT 24  INR 0.98    Sepsis Markers  Recent Labs Lab 03/14/16 0321 03/14/16 1213 03/15/16 0451  LATICACIDVEN 0.7 0.6  --   PROCALCITON   --  <0.10 <0.10    ABG  Recent Labs Lab 03/14/16 1010 03/14/16 1300 03/14/16 1750  PHART 7.07* 7.18* 7.30*  PCO2ART 120* 94* 72*  PO2ART 256* 73* 84    Liver Enzymes  Recent Labs Lab 03/14/16 0320 03/14/16 1213  AST 29 19  ALT 28 26  ALKPHOS 49 43  BILITOT 0.7 0.7  ALBUMIN 4.6 4.1    Cardiac Enzymes  Recent Labs Lab 03/10/16 0414 03/14/16 0320 03/14/16 1213  TROPONINI <0.03 <0.03 <0.03    Glucose  Recent Labs Lab 03/14/16 1641 03/14/16 1935 03/14/16 2334 03/15/16 0021 03/15/16 0326 03/15/16 0720  GLUCAP 106* 143* 164* 156* 177* 111*    Imaging Dg Chest 1 View  Result Date: 03/15/2016 CLINICAL DATA:  60 year old female with dyspnea. EXAM: CHEST 1 VIEW COMPARISON:  Chest radiograph dated 03/14/2016 FINDINGS: Left IJ central line, endotracheal tube in stable positioning. There has been interval placement of an enteric tube the tip beyond the inferior margin of the image. Progression of the right upper lobe consolidative changes with volume loss. There is no pleural effusion or pneumothorax. The cardiac silhouette is within normal limits. No acute osseous pathology. IMPRESSION: Interval placement of an enteric tube with tip beyond the inferior  margin of the image. Endotracheal tube and left IJ line in stable positioning. Progression of right upper lobe opacity and volume loss. Electronically Signed   By: Elgie Collard M.D.   On: 03/15/2016 05:24   Dg Chest 1 View  Result Date: 03/14/2016 CLINICAL DATA:  COPD exacerbation. Acute respiratory failure with hypoxemia. Sepsis. Central line placement EXAM: CHEST 1 VIEW COMPARISON:  Prior today FINDINGS: New left jugular central venous catheter seen with tip overlying the distal SVC. No evidence of pneumothorax. Endotracheal tube remains in appropriate position. Heart size is normal. Pulmonary hyperinflation again noted. New right upper lobe opacity and volume loss seen, which may be due to atelectasis or pneumonia.  IMPRESSION: New left jugular central venous catheter in appropriate position. No evidence of pneumothorax. New right upper lobe atelectasis or pneumonia. Electronically Signed   By: Myles Rosenthal M.D.   On: 03/14/2016 12:16   Dg Chest 1 View  Result Date: 03/14/2016 CLINICAL DATA:  Status post intubation, history of CHF, COPD EXAM: CHEST 1 VIEW COMPARISON:  Portable chest x-ray of March 14, 2016 FINDINGS: The endotracheal tube tip lies 4.8 cm above the carina. The lungs remain well-expanded. There is no pneumothorax, pneumomediastinum, or pleural effusion. There is no interstitial or alveolar infiltrate. The heart and pulmonary vascularity are normal. The observed bony thorax is unremarkable. There is moderate gaseous distention of the stomach. External pacemaker defibrillator pads are present. IMPRESSION: Reasonable positioning of the endotracheal tube. Mild hyperinflation. No acute cardiopulmonary abnormality. Electronically Signed   By: David  [place] M.D.   On: 03/14/2016 11:32   Dg Abd 1 View  Result Date: 03/14/2016 CLINICAL DATA:  Orogastric tube placement EXAM: ABDOMEN - 1 VIEW COMPARISON:  None. FINDINGS: There is normal small bowel gas pattern. There is NG tube coiled within mid stomach with tip in proximal stomach. IMPRESSION: NG tube coiled within mid stomach with tip in proximal stomach. Electronically Signed   By: Natasha Mead M.D.   On: 03/14/2016 13:58   STUDIES:  Echo 12/15>>  CULTURES: None  ANTIBIOTICS: Ceftriaxone 12/15>>  SIGNIFICANT EVENTS: 12/15-Pt admitted to [facility] ICU due to acute on chronic hypercapnic respiratory failure secondary to AECOPD and Asthma Exacerbation 12/15-PCCM consulted pt mechanically intubated due to worsening respiratory failure   LINES/TUBES: ETT 12/15>> Left internal jugular CVL 12/15>>  ASSESSMENT / PLAN:  PULMONARY A: Acute on chronic hypercapnic respiratory failure secondary to AECOPD and Asthma Exacerbation Mechanical Ventilation P:    Full vent support wean as tolerated VAP bundle Aggressive Bronchodilator therapy IV steroids  Repeat ABG today 12/15 CXR in am 12/16  CARDIOVASCULAR A:  Hypotension R on T Phenomenon likely secondary to acute respiratory failure Sinus tachycardia  Hx: Diastolic CHF and HTN P:  Stat EKG 12/15 Trend troponin's Echo pending Hold outpatient atenolol, amlodipine, and hydrochlorothiazide Prn Levophed gtt to maintain map >65  RENAL A:   No acute issues P:   Trend BMP's  Replace electrolytes as indicated  Monitor uop  GASTROINTESTINAL A:   Hx: GERD P:   Start tube feeds Pepcid for PUD prophylaxis   HEMATOLOGIC A:   No acute issues P:  Lovenox for VTE prophylaxis Trend CBC's Monitor for s/sx of bleeding Transfuse for hgb <7  INFECTIOUS A:   No acute issues P:   Trend WBC's and monitor fever cure Trend lactic acid and PCT's Continue abx as listed above Will obtain cultures if pt becomes febrile or develops leukocytosis   ENDOCRINE A:   No acute issues  P:   CBG's q4hrs  NEUROLOGIC A:   Hx: Anxiety P:   RASS goal: 0 to -1 Fentanyl gtt, prn Versed and Fentanyl to maintain RASS goal and for pain management WUA daily Promote family presence at bedside     -Wells Guileseep Jacey Pelc, M.D. 03/15/2016  Critical Care Attestation.  I have personally obtained a history, examined the patient, evaluated laboratory and imaging results, formulated the assessment and plan and placed orders. The Patient requires high complexity decision making for assessment and support, frequent evaluation and titration of therapies, application of advanced monitoring technologies and extensive interpretation of multiple databases. The patient has critical illness that could lead imminently to failure of 1 or more organ systems and requires the highest level of physician preparedness to intervene.  Critical Care Time devoted to patient care services described in this note is 45 minutes  and is exclusive of time spent in procedures.

## 2016-03-16 ENCOUNTER — Inpatient Hospital Stay: Payer: Medicaid Other

## 2016-03-16 DIAGNOSIS — J45901 Unspecified asthma with (acute) exacerbation: Secondary | ICD-10-CM

## 2016-03-16 LAB — PROCALCITONIN: Procalcitonin: <0.1 ng/mL

## 2016-03-16 LAB — BASIC METABOLIC PANEL
Anion gap: 3 — ABNORMAL LOW (ref 5–15)
BUN: 22 mg/dL — ABNORMAL HIGH (ref 6–20)
CALCIUM: 8.5 mg/dL — AB (ref 8.9–10.3)
CO2: 34 mmol/L — AB (ref 22–32)
CREATININE: 0.62 mg/dL (ref 0.44–1.00)
Chloride: 102 mmol/L (ref 101–111)
GFR calc Af Amer: >60 mL/min (ref >60)
GFR calc non Af Amer: >60 mL/min (ref >60)
GLUCOSE: 150 mg/dL — AB (ref 65–99)
Potassium: 4.8 mmol/L (ref 3.5–5.1)
Sodium: 139 mmol/L (ref 135–145)

## 2016-03-16 LAB — BLOOD GAS, ARTERIAL
Acid-Base Excess: 9.6 mmol/L — ABNORMAL HIGH (ref 0.0–2.0)
Bicarbonate: 38.4 mmol/L — ABNORMAL HIGH (ref 20.0–28.0)
FIO2: 0.35
MECHVT: 400 mL
O2 Saturation: 77.8 %
PEEP: 5 cmH2O
PH ART: 7.3 — AB (ref 7.350–7.450)
PO2 ART: 47 mmHg — AB (ref 83.0–108.0)
Patient temperature: 37
RATE: 22 resp/min
pCO2 arterial: 78 mmHg (ref 32.0–48.0)

## 2016-03-16 LAB — CBC
HEMATOCRIT: 30.9 % — AB (ref 35.0–47.0)
Hemoglobin: 10.3 g/dL — ABNORMAL LOW (ref 12.0–16.0)
MCH: 32.9 pg (ref 26.0–34.0)
MCHC: 33.2 g/dL (ref 32.0–36.0)
MCV: 99.1 fL (ref 80.0–100.0)
Platelets: 208 10*3/uL (ref 150–440)
RBC: 3.12 MIL/uL — ABNORMAL LOW (ref 3.80–5.20)
RDW: 13.1 % (ref 11.5–14.5)
WBC: 9.5 10*3/uL (ref 3.6–11.0)

## 2016-03-16 LAB — GLUCOSE, CAPILLARY
GLUCOSE-CAPILLARY: 123 mg/dL — AB (ref 65–99)
GLUCOSE-CAPILLARY: 156 mg/dL — AB (ref 65–99)
Glucose-Capillary: 130 mg/dL — ABNORMAL HIGH (ref 65–99)
Glucose-Capillary: 136 mg/dL — ABNORMAL HIGH (ref 65–99)
Glucose-Capillary: 155 mg/dL — ABNORMAL HIGH (ref 65–99)
Glucose-Capillary: 158 mg/dL — ABNORMAL HIGH (ref 65–99)

## 2016-03-16 LAB — INFLUENZA PANEL BY PCR (TYPE A & B)
INFLAPCR: NEGATIVE
Influenza B By PCR: NEGATIVE

## 2016-03-16 LAB — EXPECTORATED SPUTUM ASSESSMENT W GRAM STAIN, RFLX TO RESP C

## 2016-03-16 LAB — EXPECTORATED SPUTUM ASSESSMENT W REFEX TO RESP CULTURE

## 2016-03-16 MED ORDER — SODIUM CHLORIDE 0.9 % IV SOLN
1.0000 mg/h | INTRAVENOUS | Status: DC
  Administered 2016-03-16: 1 mg/h via INTRAVENOUS
  Administered 2016-03-16: 2 mg/h via INTRAVENOUS
  Administered 2016-03-17 – 2016-03-18 (×2): 1 mg/h via INTRAVENOUS
  Filled 2016-03-16 (×4): qty 10

## 2016-03-16 MED ORDER — METHYLPREDNISOLONE SODIUM SUCC 125 MG IJ SOLR
80.0000 mg | Freq: Four times a day (QID) | INTRAMUSCULAR | Status: DC
  Administered 2016-03-16 – 2016-03-17 (×5): 80 mg via INTRAVENOUS
  Filled 2016-03-16 (×5): qty 2

## 2016-03-16 MED ORDER — SODIUM CHLORIDE 0.9 % IV BOLUS (SEPSIS)
1000.0000 mL | Freq: Once | INTRAVENOUS | Status: AC
  Administered 2016-03-16: 1000 mL via INTRAVENOUS

## 2016-03-16 NOTE — Progress Notes (Signed)
PULMONARY / CRITICAL CARE MEDICINE   Name: Julie Foley MRN: [id number] DOB: 1955/08/23    ADMISSION DATE:  03/14/2016   SUBJECTIVE:  Pt currently intubated   REVIEW OF SYSTEMS:   Unable to assess pt intubated   VITAL SIGNS: BP 124/66   Pulse 91   Temp 98.9 F (37.2 C) (Oral)   Resp 18   Ht 5\' 2"  (1.575 m)   Wt 82.9 kg (182 lb 12.2 oz)   SpO2 96%   BMI 33.43 kg/m   HEMODYNAMICS:    VENTILATOR SETTINGS: Vent Mode: PRVC FiO2 (%):  [30 %-35 %] 35 % Set Rate:  [22 bmp] 22 bmp Vt Set:  [400 mL] 400 mL PEEP:  [5 cmH20] 5 cmH20 Plateau Pressure:  [20 cmH20-26 cmH20] 26 cmH20  INTAKE / OUTPUT: I/O last 3 completed shifts: In: 2966.3 [I.V.:1446.3; NG/GT:1520] Out: 1140 [Urine:1140]  PHYSICAL EXAMINATION: General:  Acutely ill appearing Caucasian female Neuro:  Sedated not following commands, moves all extremities, PERRL HEENT:  Supple, JVD present  Cardiovascular:  Sinus tachycardia, s1s2, no M/R/G Lungs:  Severely diminished with diffuse bilateral inspiratory wheezes,  Abdomen: +BS x4, soft, non tender, non distended Musculoskeletal:  Normal bulk and tone, no edema Skin: Intact no rashes or lesions  LABS:  BMET  Recent Labs Lab 03/14/16 1213 03/15/16 0451 03/16/16 0503  NA 140 139 139  K 4.3 4.7 4.8  CL 101 101 102  CO2 34* 31 34*  BUN 16 21* 22*  CREATININE 0.94 0.86 0.62  GLUCOSE 182* 143* 150*    Electrolytes  Recent Labs Lab 03/14/16 1213 03/14/16 2042 03/15/16 0451 03/15/16 1621 03/16/16 0503  CALCIUM 8.6*  --  8.5*  --  8.5*  MG 2.1 2.0 2.2 2.4  --   PHOS 6.3* 3.1 3.0 3.3  --     CBC  Recent Labs Lab 03/14/16 1213 03/15/16 0451 03/16/16 0503  WBC 6.7 7.0 9.5  HGB 12.3 11.1* 10.3*  HCT 37.3 33.3* 30.9*  PLT 273 228 208    Coag's  Recent Labs Lab 03/14/16 1213  APTT 24  INR 0.98    Sepsis Markers  Recent Labs Lab 03/14/16 0321 03/14/16 1213 03/15/16 0451 03/16/16 0503  LATICACIDVEN 0.7 0.6  --   --    PROCALCITON  --  <0.10 <0.10 <0.10    ABG  Recent Labs Lab 03/14/16 1300 03/14/16 1750 03/16/16 0500  PHART 7.18* 7.30* 7.30*  PCO2ART 94* 72* 78*  PO2ART 73* 84 47*    Liver Enzymes  Recent Labs Lab 03/14/16 0320 03/14/16 1213  AST 29 19  ALT 28 26  ALKPHOS 49 43  BILITOT 0.7 0.7  ALBUMIN 4.6 4.1    Cardiac Enzymes  Recent Labs Lab 03/10/16 0414 03/14/16 0320 03/14/16 1213  TROPONINI <0.03 <0.03 <0.03    Glucose  Recent Labs Lab 03/15/16 1138 03/15/16 1540 03/15/16 1949 03/15/16 2348 03/16/16 0342 03/16/16 0727  GLUCAP 122* 127* 123* 138* 136* 130*    Imaging Dg Chest 1 View  Result Date: 03/16/2016 CLINICAL DATA:  Dyspnea. EXAM: CHEST 1 VIEW COMPARISON:  03/15/2016. FINDINGS: Stable mildly enlarged cardiac silhouette and mildly prominent interstitial markings. Progressive right upper lobe collapse. Endotracheal tube in satisfactory position. Nasogastric tube extending into the stomach. Left jugular catheter tip at the superior cavoatrial junction. No pneumothorax. No acute bony abnormality. IMPRESSION: 1. Progressive right upper lobe collapse. 2. Stable mild cardiomegaly and mild chronic interstitial lung disease. Electronically Signed   By: Zada FindersSteven  Reid M.D.  On: 03/16/2016 08:06   STUDIES:  Echo 12/15>>EF=65%; PAP=45  CULTURES: None Sputum 12/17 pending.  MRSA PCR negative.    ANTIBIOTICS: Ceftriaxone 12/15>>  SIGNIFICANT EVENTS: 12/15-[facility] ICU due to acute on chronic hypercapnic respiratory failure secondary to AECOPD and Asthma Exacerbation 12/15-PCCM consulted pt mechanically intubated due to worsening respiratory failure   LINES/TUBES: ETT 12/15>> Left internal jugular CVL 12/15>>  ASSESSMENT / PLAN:  PULMONARY A: Acute on chronic hypercapnic respiratory failure secondary to AECOPD and Asthma Exacerbation requiring Mechanical Ventilation Pulmonary hypertension.  Subsegmental atelectasis at RUL.  Continued  hypercapnia-- 7.30/78/47/38.4 P:   Pt continued wheezing, no weaning today. Solumedrol increased.  VAP bundle Aggressive Bronchodilator therapy --Start percussion band for atelectasis.    CARDIOVASCULAR A:  Hypotension R on T Phenomenon likely secondary to acute respiratory failure Sinus tachycardia  Hx: Diastolic CHF and HTN P:   Trend troponin's  Hold outpatient atenolol, amlodipine, and hydrochlorothiazide Prn Levophed gtt to maintain map >65  RENAL A:   No acute issues P:   Trend BMP's  Replace electrolytes as indicated  Monitor uop  GASTROINTESTINAL A:   Hx: GERD P:   Continue  tube feeds Pepcid for PUD prophylaxis   HEMATOLOGIC A:   No acute issues P:  Lovenox for VTE prophylaxis Trend CBC's Monitor for s/sx of bleeding Transfuse for hgb <7  INFECTIOUS A:   No acute issues P:   Trend WBC's and monitor fever curve Trend lactic acid and PCT's Continue abx as listed above Will obtain cultures if pt becomes febrile or develops leukocytosis   ENDOCRINE A:   No acute issues   P:   CBG's q4hrs  NEUROLOGIC A:   Hx: Anxiety P:   RASS goal: 0 to -1 Fentanyl gtt, prn Versed and Fentanyl to maintain RASS goal and for pain management WUA daily Promote family presence at bedside     -Wells Guileseep Jiovany Scheffel, M.D. 03/16/2016  Critical Care Attestation.  I have personally obtained a history, examined the patient, evaluated laboratory and imaging results, formulated the assessment and plan and placed orders. The Patient requires high complexity decision making for assessment and support, frequent evaluation and titration of therapies, application of advanced monitoring technologies and extensive interpretation of multiple databases. The patient has critical illness that could lead imminently to failure of 1 or more organ systems and requires the highest level of physician preparedness to intervene.  Critical Care Time devoted to patient care services  described in this note is 45 minutes and is exclusive of time spent in procedures.

## 2016-03-16 NOTE — Progress Notes (Signed)
MEDICATION RELATED CONSULT NOTE  Pharmacy Consult for electrolyte replacement and constipation prevention.   Pharmacy consulted for electrolyte replacement and constipation prevention for 60 yo female intubated ICU patient. Patient is currently requiring fentanyl drip.   Plan:  1. Electrolytes: electrolytes are WNL. Will recheck on 12/18   2. Constipation prevention: will continue patient on senna/docusate 1 tab BID.   Allergies  Allergen Reactions  . Symbicort [Budesonide-Formoterol Fumarate] Swelling and Other (See Comments)    Reaction:  Tongue swelling   . Formoterol Fumarate Swelling and Other (See Comments)    Reaction:  Tongue swelling    Patient Measurements: Height: 5\' 2"  (157.5 cm) Weight: 182 lb 12.2 oz (82.9 kg) IBW/kg (Calculated) : 50.1  Vital Signs: Temp: 99 F (37.2 C) (12/17 0800) Temp Source: Axillary (12/17 0800) BP: 122/72 (12/17 1100) Pulse Rate: 69 (12/17 1100) Intake/Output from previous day: 12/16 0701 - 12/17 0700 In: 1418.5 [I.V.:498.5; NG/GT:920] Out: 715 [Urine:715] Intake/Output from this shift: Total I/O In: 415.1 [I.V.:165.1; NG/GT:250] Out: -   Labs:  Recent Labs  03/14/16 0320  03/14/16 1213 03/14/16 2042 03/15/16 0451 03/15/16 1621 03/16/16 0503  WBC 7.7  --  6.7  --  7.0  --  9.5  HGB 13.9  --  12.3  --  11.1*  --  10.3*  HCT 41.3  --  37.3  --  33.3*  --  30.9*  PLT 291  --  273  --  228  --  208  APTT  --   --  24  --   --   --   --   CREATININE 0.81  --  0.94  --  0.86  --  0.62  MG  --   < > 2.1 2.0 2.2 2.4  --   PHOS  --   < > 6.3* 3.1 3.0 3.3  --   ALBUMIN 4.6  --  4.1  --   --   --   --   PROT 7.4  --  6.5  --   --   --   --   AST 29  --  19  --   --   --   --   ALT 28  --  26  --   --   --   --   ALKPHOS 49  --  43  --   --   --   --   BILITOT 0.7  --  0.7  --   --   --   --   < > = values in this interval not displayed. Estimated Creatinine Clearance: 76.5 mL/min (by C-G formula based on SCr of 0.62  mg/dL).   Pharmacy will continue to monitor and adjust per consult.   Kylen Ismael D 03/16/2016,11:44 AM

## 2016-03-16 NOTE — Progress Notes (Signed)
Patient is sedated on vent with fentanyl going at 325 mcg. Restless at times and was given versed prn once throughout the shift. Patient is able to follow commands and answer questions with nodding or shaking of head. Vital sign was stable during shift. Patient have small urine output. 1 Liter bolus was given at 12 am, output improve slightly.

## 2016-03-16 NOTE — Progress Notes (Signed)
eLink Physician-Brief Progress Note Patient Name: Julie BunSylvia L Foley DOB: June 13, 1955 MRN: [id number]   Date of Service  03/16/2016  HPI/Events of Note  Oliguria - Last LVEF = 60% to 65%.  eICU Interventions  Will order: 1. Bolus with 0.9 NaCl 1 liter IV over 1 hour now.      Intervention Category Intermediate Interventions: Oliguria - evaluation and management  [name] 03/16/2016, 12:12 AM

## 2016-03-17 ENCOUNTER — Inpatient Hospital Stay: Payer: Medicaid Other

## 2016-03-17 LAB — BLOOD GAS, ARTERIAL
ACID-BASE EXCESS: 13.8 mmol/L — AB (ref 0.0–2.0)
ACID-BASE EXCESS: 8 mmol/L — AB (ref 0.0–2.0)
BICARBONATE: 42 mmol/L — AB (ref 20.0–28.0)
Bicarbonate: 38.1 mmol/L — ABNORMAL HIGH (ref 20.0–28.0)
FIO2: 0.3
FIO2: 0.4
LHR: 22 {breaths}/min
LHR: 25 {breaths}/min
MECHVT: 400 mL
O2 SAT: 88.4 %
O2 Saturation: 82.1 %
PATIENT TEMPERATURE: 37
PCO2 ART: 89 mmHg — AB (ref 32.0–48.0)
PEEP/CPAP: 5 cmH2O
PEEP/CPAP: 5 cmH2O
PH ART: 7.35 (ref 7.350–7.450)
PO2 ART: 49 mmHg — AB (ref 83.0–108.0)
Patient temperature: 37
VT: 450 mL
pCO2 arterial: 76 mmHg (ref 32.0–48.0)
pH, Arterial: 7.24 — ABNORMAL LOW (ref 7.350–7.450)
pO2, Arterial: 65 mmHg — ABNORMAL LOW (ref 83.0–108.0)

## 2016-03-17 LAB — BASIC METABOLIC PANEL
ANION GAP: 3 — AB (ref 5–15)
BUN: 23 mg/dL — ABNORMAL HIGH (ref 6–20)
CO2: 37 mmol/L — AB (ref 22–32)
Calcium: 8.8 mg/dL — ABNORMAL LOW (ref 8.9–10.3)
Chloride: 101 mmol/L (ref 101–111)
Creatinine, Ser: 0.63 mg/dL (ref 0.44–1.00)
GFR calc Af Amer: >60 mL/min (ref >60)
GFR calc non Af Amer: >60 mL/min (ref >60)
GLUCOSE: 154 mg/dL — AB (ref 65–99)
POTASSIUM: 4.5 mmol/L (ref 3.5–5.1)
Sodium: 141 mmol/L (ref 135–145)

## 2016-03-17 LAB — GLUCOSE, CAPILLARY
GLUCOSE-CAPILLARY: 107 mg/dL — AB (ref 65–99)
GLUCOSE-CAPILLARY: 154 mg/dL — AB (ref 65–99)
Glucose-Capillary: 159 mg/dL — ABNORMAL HIGH (ref 65–99)
Glucose-Capillary: 137 mg/dL — ABNORMAL HIGH (ref 65–99)
Glucose-Capillary: 132 mg/dL — ABNORMAL HIGH (ref 65–99)
Glucose-Capillary: 123 mg/dL — ABNORMAL HIGH (ref 65–99)

## 2016-03-17 LAB — CBC
HCT: 28.4 % — ABNORMAL LOW (ref 35.0–47.0)
Hemoglobin: 9.5 g/dL — ABNORMAL LOW (ref 12.0–16.0)
MCH: 32.8 pg (ref 26.0–34.0)
MCHC: 33.3 g/dL (ref 32.0–36.0)
MCV: 98.4 fL (ref 80.0–100.0)
PLATELETS: 202 10*3/uL (ref 150–440)
RBC: 2.89 MIL/uL — ABNORMAL LOW (ref 3.80–5.20)
RDW: 13 % (ref 11.5–14.5)
WBC: 7.4 10*3/uL (ref 3.6–11.0)

## 2016-03-17 LAB — MAGNESIUM: Magnesium: 2.6 mg/dL — ABNORMAL HIGH (ref 1.7–2.4)

## 2016-03-17 MED ORDER — SODIUM CHLORIDE 0.9 % IV SOLN
30.0000 meq | Freq: Once | INTRAVENOUS | Status: DC
  Filled 2016-03-17: qty 15

## 2016-03-17 MED ORDER — AMLODIPINE BESYLATE 5 MG PO TABS
5.0000 mg | ORAL_TABLET | Freq: Every day | ORAL | Status: DC
  Administered 2016-03-17 – 2016-03-20 (×4): 5 mg
  Filled 2016-03-17 (×4): qty 1

## 2016-03-17 MED ORDER — BISACODYL 10 MG RE SUPP
10.0000 mg | Freq: Once | RECTAL | Status: AC
  Administered 2016-03-17: 10 mg via RECTAL
  Filled 2016-03-17: qty 1

## 2016-03-17 MED ORDER — FUROSEMIDE 10 MG/ML IJ SOLN
INTRAMUSCULAR | Status: AC
  Administered 2016-03-17: 40 mg via INTRAVENOUS
  Filled 2016-03-17: qty 4

## 2016-03-17 MED ORDER — DEXTROSE 5 % IV SOLN
500.0000 mg | INTRAVENOUS | Status: DC
  Administered 2016-03-17 – 2016-03-20 (×4): 500 mg via INTRAVENOUS
  Filled 2016-03-17 (×5): qty 500

## 2016-03-17 MED ORDER — ALPRAZOLAM 0.5 MG PO TABS
0.5000 mg | ORAL_TABLET | Freq: Three times a day (TID) | ORAL | Status: DC
  Administered 2016-03-17 – 2016-03-20 (×12): 0.5 mg via ORAL
  Filled 2016-03-17 (×12): qty 1

## 2016-03-17 MED ORDER — METHYLPREDNISOLONE SODIUM SUCC 40 MG IJ SOLR
40.0000 mg | Freq: Every day | INTRAMUSCULAR | Status: DC
  Administered 2016-03-18 – 2016-03-21 (×4): 40 mg via INTRAVENOUS
  Filled 2016-03-17 (×5): qty 1

## 2016-03-17 MED ORDER — FUROSEMIDE 10 MG/ML IJ SOLN
40.0000 mg | Freq: Once | INTRAMUSCULAR | Status: AC
  Administered 2016-03-17: 40 mg via INTRAVENOUS

## 2016-03-17 NOTE — Progress Notes (Signed)
MEDICATION RELATED CONSULT NOTE  Pharmacy Consult for electrolyte replacement and constipation prevention.   Pharmacy consulted for electrolyte replacement and constipation prevention for 60 yo female intubated ICU patient. Patient is currently requiring fentanyl drip.   Plan:  1. Electrolytes: no replacement warranted at this time. Will recheck with am labs.   2. Constipation prevention: will continue patient on senna/docusate 1 tab BID. Will add bisacodyl suppository x 1.   Allergies  Allergen Reactions  . Symbicort [Budesonide-Formoterol Fumarate] Swelling and Other (See Comments)    Reaction:  Tongue swelling   . Formoterol Fumarate Swelling and Other (See Comments)    Reaction:  Tongue swelling    Patient Measurements: Height: 5\' 2"  (157.5 cm) Weight: 186 lb 11.7 oz (84.7 kg) IBW/kg (Calculated) : 50.1  Vital Signs: Temp: 98.8 F (37.1 C) (12/18 1200) Temp Source: Axillary (12/18 1200) BP: 137/73 (12/18 1300) Pulse Rate: 78 (12/18 1300) Intake/Output from previous day: 12/17 0701 - 12/18 0700 In: 2432.4 [I.V.:932.4; NG/GT:1400; IV Piggyback:100] Out: 1300 [Urine:1300] Intake/Output from this shift: Total I/O In: 582.6 [I.V.:232.6; NG/GT:350] Out: 1700 [Urine:1700]  Labs:  Recent Labs  03/14/16 2042 03/15/16 0451 03/15/16 1621 03/16/16 0503 03/17/16 0400 03/17/16 0510  WBC  --  7.0  --  9.5 7.4  --   HGB  --  11.1*  --  10.3* 9.5*  --   HCT  --  33.3*  --  30.9* 28.4*  --   PLT  --  228  --  208 202  --   CREATININE  --  0.86  --  0.62  --  0.63  MG 2.0 2.2 2.4  --   --  2.6*  PHOS 3.1 3.0 3.3  --   --   --    Estimated Creatinine Clearance: 77.3 mL/min (by C-G formula based on SCr of 0.63 mg/dL).   Pharmacy will continue to monitor and adjust per consult.   Simpson,Michael L 03/17/2016,4:00 PM

## 2016-03-17 NOTE — Progress Notes (Signed)
PULMONARY / CRITICAL CARE MEDICINE   Name: Julie Foley MRN: [id number] DOB: 08-26-1955    ADMISSION DATE:  03/14/2016  HISTORY OF PRESENT ILLNESS:   This is a 60 yo female with a PMH of HTN, GERD, Severe COPD, Chronic respiratory failure, Asthma, Anxiety, Diastolic CHF.  She presented to [facility] ER on 12/15 with acute onset of dyspnea 12/15.  She has been seen multiple times in the emergency department for respiratory distress and has left AMA multiple times.  Upon this presentation to the ER she was tachycardic hr 126, tachypneic, and hypertensive systolic bp 174.  Per ER notes she did admit to smoking crack cocaine before onset of symptoms. She was admitted to ICU due to acute on chronic hypercapnic respiratory failure secondary to AECOPD and Asthma exacerbation requiring Bipap, however her respiratory status continued to decline and she was subsequently intubated.  PCCM contacted 12/15 for further management due to worsening acute on chronic hypercapnic respiratory failure requiring mechanical intubation.  SUBJECTIVE:  Episode of hypoxia with SPO2 in the 60s early this morning requiring BVM ventilation. Patient was lying flat for repositioning when she dropped her O2 sats to the low 60s, Failed to recuperate once placed at 45 degrees with 100% FiO2  Hence BVM ventilation. SPO2 improved to high 90s. STAT X-ray chest and abdomen reviewed. ABG prior to episode with pH 7.35/PCO2 76/PO2 49/HCO3 42/SPO2 82%  REVIEW OF SYSTEMS:   Unable to assess pt intubated   VITAL SIGNS: BP 117/65   Pulse 80   Temp 98.6 F (37 C) (Axillary)   Resp (!) 22   Ht 5\' 2"  (1.575 m)   Wt 84.7 kg (186 lb 11.7 oz)   SpO2 93%   BMI 34.15 kg/m   HEMODYNAMICS:    VENTILATOR SETTINGS: Vent Mode: PRVC FiO2 (%):  [30 %-35 %] 30 % Set Rate:  [22 bmp] 22 bmp Vt Set:  [400 mL-500 mL] 400 mL PEEP:  [5 cmH20] 5 cmH20 Plateau Pressure:  [26 cmH20] 26 cmH20  INTAKE / OUTPUT: I/O last 3 completed shifts: In: 2559.9  [I.V.:909.9; NG/GT:1550; IV Piggyback:100] Out: 1400 [Urine:1400]  PHYSICAL EXAMINATION: General:  Acutely ill appearing Caucasian female Neuro:  Sedated not following commands, moves all extremities HEENT: PERRLA, +corneal reflexes, neck is upple, JVD present  Cardiovascular:  Sinus tachycardia, s1s2, no M/R/G Lungs: Bilateral airflow with moderately decreased breath sounds in the bases, + expiratory wheezes in right lung fields  Abdomen: +BS x4, soft, non tender, non distended Musculoskeletal:  Normal bulk and tone Extremities: +2 pulses bilaterally, no edema Skin: Intact no rashes or lesions  LABS:  BMET  Recent Labs Lab 03/14/16 1213 03/15/16 0451 03/16/16 0503  NA 140 139 139  K 4.3 4.7 4.8  CL 101 101 102  CO2 34* 31 34*  BUN 16 21* 22*  CREATININE 0.94 0.86 0.62  GLUCOSE 182* 143* 150*    Electrolytes  Recent Labs Lab 03/14/16 1213 03/14/16 2042 03/15/16 0451 03/15/16 1621 03/16/16 0503  CALCIUM 8.6*  --  8.5*  --  8.5*  MG 2.1 2.0 2.2 2.4  --   PHOS 6.3* 3.1 3.0 3.3  --     CBC  Recent Labs Lab 03/15/16 0451 03/16/16 0503 03/17/16 0400  WBC 7.0 9.5 7.4  HGB 11.1* 10.3* 9.5*  HCT 33.3* 30.9* 28.4*  PLT 228 208 202    Coag's  Recent Labs Lab 03/14/16 1213  APTT 24  INR 0.98    Sepsis Markers  Recent Labs Lab  03/14/16 0321 03/14/16 1213 03/15/16 0451 03/16/16 0503  LATICACIDVEN 0.7 0.6  --   --   PROCALCITON  --  <0.10 <0.10 <0.10    ABG  Recent Labs Lab 03/14/16 1750 03/16/16 0500 03/17/16 0500  PHART 7.30* 7.30* 7.35  PCO2ART 72* 78* 76*  PO2ART 84 47* 49*    Liver Enzymes  Recent Labs Lab 03/14/16 0320 03/14/16 1213  AST 29 19  ALT 28 26  ALKPHOS 49 43  BILITOT 0.7 0.7  ALBUMIN 4.6 4.1    Cardiac Enzymes  Recent Labs Lab 03/14/16 0320 03/14/16 1213  TROPONINI <0.03 <0.03    Glucose  Recent Labs Lab 03/16/16 0727 03/16/16 1120 03/16/16 1635 03/16/16 1939 03/17/16 0002 03/17/16 0355   GLUCAP 130* 123* 155* 156* 158* 159*    Imaging Dg Chest 1 View  Result Date: 03/16/2016 CLINICAL DATA:  Dyspnea. EXAM: CHEST 1 VIEW COMPARISON:  03/15/2016. FINDINGS: Stable mildly enlarged cardiac silhouette and mildly prominent interstitial markings. Progressive right upper lobe collapse. Endotracheal tube in satisfactory position. Nasogastric tube extending into the stomach. Left jugular catheter tip at the superior cavoatrial junction. No pneumothorax. No acute bony abnormality. IMPRESSION: 1. Progressive right upper lobe collapse. 2. Stable mild cardiomegaly and mild chronic interstitial lung disease. Electronically Signed   By: Beckie SaltsSteven  Reid M.D.   On: 03/16/2016 08:06   STUDIES:  Echo 12/15>>EF=65%; PAP=45  CULTURES: Blood cultures: No growth to date Sputum 12/17 pending.  MRSA PCR negative.    ANTIBIOTICS: Ceftriaxone 12/15>>  SIGNIFICANT EVENTS: 12/15-[facility] ICU due to acute on chronic hypercapnic respiratory failure secondary to AECOPD and Asthma Exacerbation 12/15-PCCM consulted pt mechanically intubated due to worsening respiratory failure   LINES/TUBES: ETT 12/15>> Left internal jugular CVL 12/15>>  ASSESSMENT / PLAN:  PULMONARY A: Acute on chronic hypercapnic respiratory failure secondary to AECOPD and Asthma Exacerbation requiring Mechanical Ventilation-Persistent episodes of hypoxemia Pulmonary hypertension.  Subsegmental atelectasis at RUL.  Continued hypercapnia and hypoxemia-- 7.35/76/49/42 P:   Full vent support with current settings-PRVC 50/5/25/400 Weaning trials as tolerated VAP bundle Aggressive Bronchodilator therapy Continue percussion band for atelectasis.  CXR and ABG daily Weaning trials as tolerated  CARDIOVASCULAR A:  Hypotension-resolved R on T Phenomenon likely secondary to acute respiratory failure Sinus tachycardia-resolved Hx: Diastolic CHF and HTN P:  Restart home dose of  Amlodipine Continue to hold atenolol,  and hydrochlorothiazide Prn Levophed gtt to maintain map >65 Hemodynamic monitoring per ICU protocol RENAL A:   No acute issues P:   Trend BMP's  Replace electrolytes as indicated  Monitor uop  GASTROINTESTINAL A:   Hx: GERD P:   Continue  tube feeds Pepcid for PUD prophylaxis  Monitor for constipation  HEMATOLOGIC A:   No acute issues P:  Lovenox for VTE prophylaxis Trend CBC's Monitor for s/sx of bleeding Transfuse for hgb <7  INFECTIOUS A:   No acute issues P:   Trend WBC's and monitor fever curve Pro-calcitonin less than 0.10 Continue abx as listed above Repeat cultures if patient becomes febrile  ENDOCRINE A:   No acute issues   P:   CBG's q4hrs  NEUROLOGIC A:   Hx: Anxiety P:   RASS goal: 0 to -1 Fentanyl gtt, prn Versed and Fentanyl to maintain RASS goal and for pain management WUA daily Promote family presence at bedside    Disposition and family update: No family at bedside. Continue care in the ICU.  Plan of care discussed with rounding attending  Libra Gatz S. Tukov ANP-BC Pulmonary  and Critical Care Medicine [facility] Pager [phone] or [phone]

## 2016-03-17 NOTE — Progress Notes (Addendum)
While patient was being bathed laying flat (at 1515 hours), she had episode of coughing, tachycardia (130's-140's), SpO2 desaturation (60's-70's), hypercarbia (70's-80's), tachypnea (RR 44), and noted ST depression. HOB was elevated when this began. This lasted for approximately 15 minutes. RT manually ventilated the patient for a brief time during this episode, during which, SpO2 went back into the mid to upper 90's, HR came down to 1-teens, and use of accessory muscles ceased. Dr. Belia HemanKasa was notified and came to bedside. STAT CXR & ABG were ordered. CXR was unremarkable. RT showed ABG to Kasa - advised he would order repeat w/ AM labs. Bath and linen change were completed w/ HOB at 45 degrees. Patient was given 50mcg Fentanyl and 1mg  Versed bolus, to which gave her relief of agitation. HR back in the 80's, BP 121/74, SpO2 94% on vent, CO2 upper 30's, RR upper 20's, RASS of -2. Dr. Belia HemanKasa updated of same at the pt's bedside. NOTE - tube feedings have been off since they were turned off during the shift prior to this writer's shift.

## 2016-03-17 NOTE — Progress Notes (Signed)
Chaplain was making his rounds and visited with pt in room Ic18. Provided the ministry of prayer and a pastoral presence.    03/17/16 1020  Clinical Encounter Type  Visited With Patient  Visit Type Initial;Spiritual support  Referral From Nurse  Spiritual Encounters  Spiritual Needs Prayer

## 2016-03-18 DIAGNOSIS — F1410 Cocaine abuse, uncomplicated: Secondary | ICD-10-CM

## 2016-03-18 LAB — GLUCOSE, CAPILLARY
GLUCOSE-CAPILLARY: 103 mg/dL — AB (ref 65–99)
GLUCOSE-CAPILLARY: 109 mg/dL — AB (ref 65–99)
GLUCOSE-CAPILLARY: 169 mg/dL — AB (ref 65–99)
GLUCOSE-CAPILLARY: 176 mg/dL — AB (ref 65–99)
Glucose-Capillary: 103 mg/dL — ABNORMAL HIGH (ref 65–99)
Glucose-Capillary: 164 mg/dL — ABNORMAL HIGH (ref 65–99)

## 2016-03-18 LAB — BASIC METABOLIC PANEL
ANION GAP: 5 (ref 5–15)
BUN: 24 mg/dL — ABNORMAL HIGH (ref 6–20)
CALCIUM: 8.9 mg/dL (ref 8.9–10.3)
CO2: 36 mmol/L — ABNORMAL HIGH (ref 22–32)
Chloride: 97 mmol/L — ABNORMAL LOW (ref 101–111)
Creatinine, Ser: 0.7 mg/dL (ref 0.44–1.00)
Glucose, Bld: 101 mg/dL — ABNORMAL HIGH (ref 65–99)
POTASSIUM: 3.8 mmol/L (ref 3.5–5.1)
SODIUM: 138 mmol/L (ref 135–145)

## 2016-03-18 LAB — BLOOD GAS, ARTERIAL
ACID-BASE EXCESS: 7.5 mmol/L — AB (ref 0.0–2.0)
Acid-Base Excess: 12.7 mmol/L — ABNORMAL HIGH (ref 0.0–2.0)
BICARBONATE: 37.4 mmol/L — AB (ref 20.0–28.0)
Bicarbonate: 37.7 mmol/L — ABNORMAL HIGH (ref 20.0–28.0)
FIO2: 0.35
FIO2: 0.35
LHR: 16 {breaths}/min
MECHANICAL RATE: 16
MECHANICAL RATE: 20
MECHVT: 500 mL
O2 SAT: 94.4 %
O2 Saturation: 95 %
PATIENT TEMPERATURE: 37
PATIENT TEMPERATURE: 37
PCO2 ART: 48 mmHg (ref 32.0–48.0)
PEEP/CPAP: 5 cmH2O
PEEP: 5 cmH2O
PO2 ART: 87 mmHg (ref 83.0–108.0)
PO2 ART: 66 mmHg — AB (ref 83.0–108.0)
VT: 500 mL
pCO2 arterial: 88 mmHg (ref 32.0–48.0)
pH, Arterial: 7.24 — ABNORMAL LOW (ref 7.350–7.450)
pH, Arterial: 7.5 — ABNORMAL HIGH (ref 7.350–7.450)

## 2016-03-18 LAB — CULTURE, RESPIRATORY

## 2016-03-18 LAB — CULTURE, RESPIRATORY W GRAM STAIN: Culture: NO GROWTH

## 2016-03-18 MED ORDER — VECURONIUM BOLUS VIA INFUSION
10.0000 mg | INTRAVENOUS | Status: DC
  Administered 2016-03-18: 10 mg via INTRAVENOUS
  Filled 2016-03-18 (×3): qty 10

## 2016-03-18 MED ORDER — BISACODYL 10 MG RE SUPP
10.0000 mg | Freq: Once | RECTAL | Status: AC
  Administered 2016-03-18: 10 mg via RECTAL
  Filled 2016-03-18: qty 1

## 2016-03-18 MED ORDER — SODIUM CHLORIDE 0.9% FLUSH
3.0000 mL | Freq: Two times a day (BID) | INTRAVENOUS | Status: DC
  Administered 2016-03-18 – 2016-03-23 (×10): 3 mL via INTRAVENOUS

## 2016-03-18 MED ORDER — PRO-STAT SUGAR FREE PO LIQD
30.0000 mL | Freq: Every day | ORAL | Status: DC
  Administered 2016-03-18 – 2016-03-19 (×2): 30 mL
  Administered 2016-03-20: 10:00:00
  Administered 2016-03-21: 30 mL

## 2016-03-18 MED ORDER — VECURONIUM BROMIDE 10 MG IV SOLR
10.0000 mg | INTRAVENOUS | Status: AC
  Administered 2016-03-18 (×2): 10 mg via INTRAVENOUS
  Filled 2016-03-18 (×2): qty 10

## 2016-03-18 MED ORDER — IPRATROPIUM-ALBUTEROL 0.5-2.5 (3) MG/3ML IN SOLN
3.0000 mL | RESPIRATORY_TRACT | Status: AC
  Administered 2016-03-18 (×3): 3 mL via RESPIRATORY_TRACT
  Filled 2016-03-18: qty 3

## 2016-03-18 MED ORDER — SENNOSIDES-DOCUSATE SODIUM 8.6-50 MG PO TABS
2.0000 | ORAL_TABLET | Freq: Two times a day (BID) | ORAL | Status: DC
  Administered 2016-03-18 – 2016-03-23 (×9): 2 via ORAL
  Filled 2016-03-18 (×10): qty 2

## 2016-03-18 MED ORDER — VECURONIUM BROMIDE 10 MG IV SOLR
INTRAVENOUS | Status: AC
  Administered 2016-03-18: 10 mg
  Filled 2016-03-18: qty 10

## 2016-03-18 NOTE — Progress Notes (Signed)
PULMONARY / CRITICAL CARE MEDICINE   Name: Julie Foley MRN: [id number] DOB: 1955/09/04    ADMISSION DATE:  03/14/2016  HISTORY OF PRESENT ILLNESS:   This is a 60 yo female with a PMH of HTN, GERD, Severe COPD, Chronic respiratory failure, Asthma, Anxiety, Diastolic CHF.  She presented to [facility] ER on 12/15 with acute onset of dyspnea 12/15.  She has been seen multiple times in the emergency department for respiratory distress and has left AMA multiple times.  Upon this presentation to the ER she was tachycardic hr 126, tachypneic, and hypertensive systolic bp 174.  Per ER notes she did admit to smoking crack cocaine before onset of symptoms. She was admitted to ICU due to acute on chronic hypercapnic respiratory failure secondary to AECOPD and Asthma exacerbation requiring Bipap, however her respiratory status continued to decline and she was subsequently intubated.  PCCM contacted 12/15 for further management due to worsening acute on chronic hypercapnic respiratory failure requiring mechanical intubation.  SUBJECTIVE:  Episode of hypoxia with SPO2 in the 60s early this morning requiring BVM ventilation. Patient was lying flat for repositioning when she dropped her O2 sats to the low 60s, Failed to recuperate once placed at 45 degrees with 100% FiO2  Hence BVM ventilation. SPO2 improved to high 90s. STAT X-ray chest and abdomen reviewed. ABG prior to episode with pH 7.35/PCO2 76/PO2 49/HCO3 42/SPO2 82%   Patient with increased wheezing, increased WOB, will NOT wean from vent today Remains critically ill  REVIEW OF SYSTEMS:   Unable to assess pt intubated   VITAL SIGNS: BP (!) 147/81   Pulse 78   Temp 98.6 F (37 C) (Axillary)   Resp (!) 22   Ht 5\' 2"  (1.575 m)   Wt 180 lb 12.4 oz (82 kg)   SpO2 95%   BMI 33.06 kg/m   HEMODYNAMICS:    VENTILATOR SETTINGS: Vent Mode: PRVC FiO2 (%):  [40 %] 40 % Set Rate:  [25 bmp] 25 bmp Vt Set:  [400 mL-500 mL] 500 mL PEEP:  [5 cmH20] 5  cmH20  INTAKE / OUTPUT: I/O last 3 completed shifts: In: 2634.6 [I.V.:1314.6; NG/GT:1320] Out: 2905 [Urine:2905]  PHYSICAL EXAMINATION: General:  Acutely ill appearing Caucasian female Neuro:  Sedated not following commands, moves all extremities HEENT: PERRLA, +corneal reflexes, neck is upple, JVD present  Cardiovascular:  Sinus tachycardia, s1s2, no M/R/G Lungs: Bilateral airflow with moderately decreased breath sounds in the bases, + expiratory wheezes Abdomen: +BS x4, soft, non tender, non distended Musculoskeletal:  Normal bulk and tone Extremities: +2 pulses bilaterally, no edema Skin: Intact no rashes or lesions  LABS:  BMET  Recent Labs Lab 03/16/16 0503 03/17/16 0510 03/18/16 0317  NA 139 141 138  K 4.8 4.5 3.8  CL 102 101 97*  CO2 34* 37* 36*  BUN 22* 23* 24*  CREATININE 0.62 0.63 0.70  GLUCOSE 150* 154* 101*    Electrolytes  Recent Labs Lab 03/14/16 2042 03/15/16 0451 03/15/16 1621 03/16/16 0503 03/17/16 0510 03/18/16 0317  CALCIUM  --  8.5*  --  8.5* 8.8* 8.9  MG 2.0 2.2 2.4  --  2.6*  --   PHOS 3.1 3.0 3.3  --   --   --     CBC  Recent Labs Lab 03/15/16 0451 03/16/16 0503 03/17/16 0400  WBC 7.0 9.5 7.4  HGB 11.1* 10.3* 9.5*  HCT 33.3* 30.9* 28.4*  PLT 228 208 202    Coag's  Recent Labs Lab 03/14/16 1213  APTT 24  INR 0.98    Sepsis Markers  Recent Labs Lab 03/14/16 0321 03/14/16 1213 03/15/16 0451 03/16/16 0503  LATICACIDVEN 0.7 0.6  --   --   PROCALCITON  --  <0.10 <0.10 <0.10    ABG  Recent Labs Lab 03/16/16 0500 03/17/16 0500 03/17/16 1538  PHART 7.30* 7.35 7.24*  PCO2ART 78* 76* 89*  PO2ART 47* 49* 65*    Liver Enzymes  Recent Labs Lab 03/14/16 0320 03/14/16 1213  AST 29 19  ALT 28 26  ALKPHOS 49 43  BILITOT 0.7 0.7  ALBUMIN 4.6 4.1    Cardiac Enzymes  Recent Labs Lab 03/14/16 0320 03/14/16 1213  TROPONINI <0.03 <0.03    Glucose  Recent Labs Lab 03/17/16 1212 03/17/16 1757  03/17/16 1940 03/17/16 2335 03/18/16 0349 03/18/16 0738  GLUCAP 154* 132* 123* 107* 103* 109*    Imaging Dg Chest 1 View  Result Date: 03/17/2016 CLINICAL DATA:  Hypercarbia EXAM: CHEST 1 VIEW COMPARISON:  03/17/2016 FINDINGS: Endotracheal tube in good position. Left jugular central venous catheter tip in the SVC unchanged. NG tube in the stomach. Right upper lobe collapse is unchanged from the prior study. No pneumothorax or pleural effusion. Negative for heart failure or pneumonia. IMPRESSION: Persistent right upper lobe collapse unchanged.  No new findings. Electronically Signed   By: Marlan Palauharles  Clark M.D.   On: 03/17/2016 16:05   STUDIES:  Echo 12/15>>EF=65%; PAP=45  CULTURES: Blood cultures: No growth to date Sputum 12/17 pending.  MRSA PCR negative.    ANTIBIOTICS: Ceftriaxone 12/15>>  SIGNIFICANT EVENTS: 12/15-[facility] due to acute on chronic hypercapnic respiratory failure secondary to AECOPD and Asthma Exacerbation 12/15-PCCM consulted pt mechanically intubated due to worsening respiratory failure  12/18 extensive b/l wheezing, unable to wean from vent LINES/TUBES: ETT 12/15>> Left internal jugular CVL 12/15>>  ASSESSMENT / PLAN:  PULMONARY A: Acute on chronic hypercapnic respiratory failure secondary to AECOPD and Asthma Exacerbation from Cocaine abuse requiring Mechanical Ventilation-Persistent episodes of hypoxemia Pulmonary hypertension.  Subsegmental atelectasis at RUL.  Continued hypercapnia and hypoxemia-- 7.35/76/49/42 P:   Full vent support with current settings-PRVC 50/5/25/400 VAP bundle Aggressive Bronchodilator therapy Weaning trials as tolerated  CARDIOVASCULAR A:  Hypotension-resolved Sinus tachycardia-resolved Hx: Diastolic CHF and HTN P:  Restart home dose of  Amlodipine Continue to hold atenolol, and hydrochlorothiazide Prn Levophed gtt to maintain map >65 Hemodynamic monitoring per ICU protocol RENAL A:   No acute  issues P:   Trend BMP's  Replace electrolytes as indicated  Monitor uop  GASTROINTESTINAL A:   Hx: GERD P:   Continue  tube feeds Pepcid for PUD prophylaxis  Monitor for constipation  HEMATOLOGIC A:   No acute issues P:  Lovenox for VTE prophylaxis Trend CBC's Monitor for s/sx of bleeding Transfuse for hgb <7  INFECTIOUS A:   No acute issues P:   Trend WBC's and monitor fever curve Pro-calcitonin less than 0.10 Continue abx as listed above Repeat cultures if patient becomes febrile  ENDOCRINE A:   No acute issues   P:   CBG's q4hrs  NEUROLOGIC A:   Hx: Anxiety P:   RASS goal: 0 to -1 Fentanyl gtt, prn Versed and Fentanyl to maintain RASS goal and for pain management WUA daily   I have personally obtained a history, examined the patient, evaluated Pertinent laboratory and RadioGraphic/imaging results, and  formulated the assessment and plan   The Patient requires high complexity decision making for assessment and support, frequent evaluation and  titration of therapies, application of advanced monitoring technologies and extensive interpretation of multiple databases. Critical Care Time devoted to patient care services described in this note is 35 minutes.   Overall, patient is critically ill, prognosis is guarded.  Patient with Multiorgan failure and at high risk for cardiac arrest and death.    Lucie LeatherKurian David Hyman Crossan, M.D.  [facility]  Medical Director [facility] Medical Director [facility] Cardio[facility]

## 2016-03-18 NOTE — Progress Notes (Signed)
Nutrition Follow-up  DOCUMENTATION CODES:   Obesity unspecified  INTERVENTION:  -Recommend continuing TF at current goal rate of 50 ml/hr, recommend addition of Prostat daily (total 1300 kcals, 121 g of protein) -If pt continues without BM, recommend further intervention regarding bowel regimen  NUTRITION DIAGNOSIS:   Inadequate oral intake related to acute illness as evidenced by NPO status.  Being addressed via TF  GOAL:   Provide needs based on ASPEN/SCCM guidelines  MONITOR:   TF tolerance, Vent status, Labs, Weight trends  REASON FOR ASSESSMENT:   Consult Enteral/tube feeding initiation and management  ASSESSMENT:    60 yo female admitted with acute on chronic respiratory failure secondary to AECOPD and asthma exacerbation requiring intubation on 12/15  Pt remains on vent support, unable to tolerate vent wean Tolerating Vital High Protein at rate of 50 ml/hr Pt continues without BM, abdomen soft/obese Labs: reviewed Meds: senokot scheduled started today  Diet Order:   NPO  Skin:  Reviewed, no issues  Last BM:  no documented BM  Height:   Ht Readings from Last 1 Encounters:  03/14/16 5\' 2"  (1.575 m)    Weight:   Wt Readings from Last 1 Encounters:  03/18/16 180 lb 12.4 oz (82 kg)    Filed Weights   03/16/16 0344 03/17/16 0441 03/18/16 0348  Weight: 182 lb 12.2 oz (82.9 kg) 186 lb 11.7 oz (84.7 kg) 180 lb 12.4 oz (82 kg)    BMI:  Body mass index is 33.06 kg/m.  Estimated Nutritional Needs:   Kcal:  [id number] kcals  Protein:  104-130 g  Fluid:  >/= 1.5 L  EDUCATION NEEDS:   No education needs identified at this time  Romelle StarcherCate Frank Novelo MS, RD, LDN [phone] Pager  [phone] Weekend/On-Call Pager

## 2016-03-18 NOTE — Progress Notes (Signed)
eLink Physician-Brief Progress Note Patient Name: Maryruth BunSylvia L Kot DOB: [birth year] MRN: [id number]   Date of Service  03/18/2016  HPI/Events of Note  Pt with hypercapenia on ABG, no sig change, pco2 ~80, moderately elevated Peak pressures on the vent  eICU Interventions  Plan: Vec 10mg  xq1hr x 3 doses Change RR to 20 Duonebs q1hr x 3 doses Abg @11pm      Intervention Category Major Interventions: Other:  Jamonta Goerner 03/18/2016, 6:53 PM

## 2016-03-18 NOTE — Progress Notes (Signed)
MEDICATION RELATED CONSULT NOTE  Pharmacy Consult for electrolyte replacement and constipation prevention.   Pharmacy consulted for electrolyte replacement and constipation prevention for 60 yo female intubated ICU patient. Patient is currently requiring fentanyl drip.   Plan:  1. Electrolytes: no replacement warranted at this time. Will recheck with am labs.   2. Constipation prevention: patient received bisacodyl suppository x 1 on 12/18. Will advance patient on senna/docusate 2 tab BID. Will order additional bisacodyl suppository x 1.   Allergies  Allergen Reactions  . Symbicort [Budesonide-Formoterol Fumarate] Swelling and Other (See Comments)    Reaction:  Tongue swelling   . Formoterol Fumarate Swelling and Other (See Comments)    Reaction:  Tongue swelling    Patient Measurements: Height: 5\' 2"  (157.5 cm) Weight: 180 lb 12.4 oz (82 kg) IBW/kg (Calculated) : 50.1  Vital Signs: Temp: 98.7 F (37.1 C) (12/19 1200) Temp Source: Axillary (12/19 1200) BP: 133/79 (12/19 1500) Pulse Rate: 74 (12/19 1500) Intake/Output from previous day: 12/18 0701 - 12/19 0700 In: 1379.6 [I.V.:829.6; NG/GT:550] Out: 2290 [Urine:2290] Intake/Output from this shift: Total I/O In: 314 [I.V.:289; NG/GT:25] Out: -   Labs:  Recent Labs  03/15/16 1621 03/16/16 0503 03/17/16 0400 03/17/16 0510 03/18/16 0317  WBC  --  9.5 7.4  --   --   HGB  --  10.3* 9.5*  --   --   HCT  --  30.9* 28.4*  --   --   PLT  --  208 202  --   --   CREATININE  --  0.62  --  0.63 0.70  MG 2.4  --   --  2.6*  --   PHOS 3.3  --   --   --   --    Estimated Creatinine Clearance: 76.1 mL/min (by C-G formula based on SCr of 0.7 mg/dL).   Pharmacy will continue to monitor and adjust per consult.   Simpson,Michael L 03/18/2016,3:15 PM

## 2016-03-19 ENCOUNTER — Inpatient Hospital Stay: Payer: Medicaid Other

## 2016-03-19 LAB — BLOOD GAS, ARTERIAL
Acid-Base Excess: 7.3 mmol/L — ABNORMAL HIGH (ref 0.0–2.0)
Acid-Base Excess: 12.2 mmol/L — ABNORMAL HIGH (ref 0.0–2.0)
BICARBONATE: 37.3 mmol/L — AB (ref 20.0–28.0)
BICARBONATE: 37.2 mmol/L — AB (ref 20.0–28.0)
FIO2: 100
FIO2: 0.35
MECHANICAL RATE: 20
MECHVT: 500 mL
MECHVT: 500 mL
Mechanical Rate: 20
O2 SAT: 99.9 %
O2 Saturation: 97.9 %
PATIENT TEMPERATURE: 37
PCO2 ART: 85 mmHg — AB (ref 32.0–48.0)
PEEP/CPAP: 5 cmH2O
PEEP/CPAP: 5 cmH2O
PH ART: 7.25 — AB (ref 7.350–7.450)
PO2 ART: 345 mmHg — AB (ref 83.0–108.0)
Patient temperature: 37
pCO2 arterial: 50 mmHg — ABNORMAL HIGH (ref 32.0–48.0)
pH, Arterial: 7.48 — ABNORMAL HIGH (ref 7.350–7.450)
pO2, Arterial: 96 mmHg (ref 83.0–108.0)

## 2016-03-19 LAB — BASIC METABOLIC PANEL
Anion gap: 4 — ABNORMAL LOW (ref 5–15)
BUN: 27 mg/dL — AB (ref 6–20)
CHLORIDE: 100 mmol/L — AB (ref 101–111)
CO2: 36 mmol/L — AB (ref 22–32)
Calcium: 8.6 mg/dL — ABNORMAL LOW (ref 8.9–10.3)
Creatinine, Ser: 0.67 mg/dL (ref 0.44–1.00)
GFR calc Af Amer: >60 mL/min (ref >60)
GLUCOSE: 112 mg/dL — AB (ref 65–99)
POTASSIUM: 3.9 mmol/L (ref 3.5–5.1)
Sodium: 140 mmol/L (ref 135–145)

## 2016-03-19 LAB — GLUCOSE, CAPILLARY
GLUCOSE-CAPILLARY: 171 mg/dL — AB (ref 65–99)
GLUCOSE-CAPILLARY: 161 mg/dL — AB (ref 65–99)
Glucose-Capillary: 110 mg/dL — ABNORMAL HIGH (ref 65–99)
Glucose-Capillary: 116 mg/dL — ABNORMAL HIGH (ref 65–99)
Glucose-Capillary: 124 mg/dL — ABNORMAL HIGH (ref 65–99)

## 2016-03-19 LAB — CBC
HCT: 30.7 % — ABNORMAL LOW (ref 35.0–47.0)
Hemoglobin: 10.2 g/dL — ABNORMAL LOW (ref 12.0–16.0)
MCH: 33 pg (ref 26.0–34.0)
MCHC: 33.3 g/dL (ref 32.0–36.0)
MCV: 99 fL (ref 80.0–100.0)
PLATELETS: 232 10*3/uL (ref 150–440)
RBC: 3.1 MIL/uL — ABNORMAL LOW (ref 3.80–5.20)
RDW: 12.9 % (ref 11.5–14.5)
WBC: 8.1 10*3/uL (ref 3.6–11.0)

## 2016-03-19 LAB — TRIGLYCERIDES: Triglycerides: 55 mg/dL (ref <150)

## 2016-03-19 MED ORDER — BISACODYL 10 MG RE SUPP
10.0000 mg | Freq: Once | RECTAL | Status: AC
  Administered 2016-03-19: 10 mg via RECTAL
  Filled 2016-03-19: qty 1

## 2016-03-19 MED ORDER — QUETIAPINE FUMARATE 25 MG PO TABS
25.0000 mg | ORAL_TABLET | Freq: Every day | ORAL | Status: DC
  Administered 2016-03-19: 25 mg via ORAL
  Filled 2016-03-19: qty 1

## 2016-03-19 MED ORDER — IPRATROPIUM-ALBUTEROL 0.5-2.5 (3) MG/3ML IN SOLN: 3.0000 mL | RESPIRATORY_TRACT | Status: DC | PRN

## 2016-03-19 MED ORDER — QUETIAPINE FUMARATE 25 MG PO TABS
25.0000 mg | ORAL_TABLET | Freq: Once | ORAL | Status: AC
  Administered 2016-03-19: 25 mg via ORAL
  Filled 2016-03-19: qty 1

## 2016-03-19 MED ORDER — IPRATROPIUM-ALBUTEROL 0.5-2.5 (3) MG/3ML IN SOLN
RESPIRATORY_TRACT | Status: AC
  Administered 2016-03-19: 3 mL via RESPIRATORY_TRACT
  Filled 2016-03-19: qty 3

## 2016-03-19 MED ORDER — INFLUENZA VAC SPLIT QUAD 0.5 ML IM SUSY
0.5000 mL | PREFILLED_SYRINGE | INTRAMUSCULAR | Status: AC | PRN
  Administered 2016-03-24: 0.5 mL via INTRAMUSCULAR
  Filled 2016-03-19: qty 0.5

## 2016-03-19 MED ORDER — PROPOFOL 1000 MG/100ML IV EMUL
5.0000 ug/kg/min | INTRAVENOUS | Status: AC
  Administered 2016-03-19: 5 ug/kg/min via INTRAVENOUS
  Administered 2016-03-19: 10 ug/kg/min via INTRAVENOUS
  Administered 2016-03-20: 15 ug/kg/min via INTRAVENOUS
  Administered 2016-03-20: 9 ug/kg/min via INTRAVENOUS
  Administered 2016-03-21: 10 ug/kg/min via INTRAVENOUS
  Filled 2016-03-19 (×5): qty 100

## 2016-03-19 MED ORDER — POLYETHYLENE GLYCOL 3350 17 G PO PACK
17.0000 g | PACK | Freq: Every day | ORAL | Status: DC
  Administered 2016-03-19 – 2016-03-20 (×2): 17 g via ORAL
  Filled 2016-03-19 (×2): qty 1

## 2016-03-19 NOTE — Progress Notes (Signed)
PULMONARY / CRITICAL CARE MEDICINE   Name: Julie Foley MRN: [id number] DOB: 1955/11/12    ADMISSION DATE:  03/14/2016  HISTORY OF PRESENT ILLNESS:   This is a 60 yo female with a PMH of HTN, GERD, Severe COPD, Chronic respiratory failure, Asthma, Anxiety, Diastolic CHF.  She presented to [facility] ER on 12/15 with acute onset of dyspnea 12/15.  She has been seen multiple times in the emergency department for respiratory distress and has left AMA multiple times.  Upon this presentation to the ER she was tachycardic hr 126, tachypneic, and hypertensive systolic bp 174.  Per ER notes she did admit to smoking crack cocaine before onset of symptoms. She was admitted to ICU due to acute on chronic hypercapnic respiratory failure secondary to AECOPD and Asthma exacerbation requiring Bipap, however her respiratory status continued to decline and she was subsequently intubated.  PCCM contacted 12/15 for further management due to worsening acute on chronic hypercapnic respiratory failure requiring mechanical intubation.  SUBJECTIVE:  Patient with Hypercapnia on ABG, No significant change, PCO2-80, moderately elevated peak pressure on the vent.  Patient is following simple commands.  Had an uneventful night.  REVIEW OF SYSTEMS:   Unable to assess pt intubated   VITAL SIGNS: BP 113/68 (BP Location: Left Arm)   Pulse 74   Temp 98.5 F (36.9 C) (Oral)   Resp 19   Ht 5\' 2"  (1.575 m)   Wt 82 kg (180 lb 12.4 oz)   SpO2 98%   BMI 33.06 kg/m   HEMODYNAMICS:    VENTILATOR SETTINGS: Vent Mode: PRVC FiO2 (%):  [35 %-40 %] 35 % Set Rate:  [16 bmp-25 bmp] 20 bmp Vt Set:  [500 mL] 500 mL PEEP:  [5 cmH20] 5 cmH20 Plateau Pressure:  [0 cmH20] 0 cmH20  INTAKE / OUTPUT: I/O last 3 completed shifts: In: 2047.7 [I.V.:1272.7; NG/GT:775] Out: 2615 [Urine:2615]  PHYSICAL EXAMINATION: General:  Acutely ill appearing Caucasian female Neuro:  Sedated  following  Simple commands, moves all extremities HEENT:  PERRLA, +corneal reflexes, neck is upple, JVD present  Cardiovascular:  Sinus tachycardia, s1s2, no M/R/G Lungs: bibasilar decreased breath sounds , no wheezes, crackles, rhonchi noted Abdomen: +BS x4, soft, non tender, non distended Musculoskeletal:  Normal bulk and tone Extremities: +2 pulses bilaterally, no edema Skin: Intact no rashes or lesions  LABS:  BMET  Recent Labs Lab 03/16/16 0503 03/17/16 0510 03/18/16 0317  NA 139 141 138  K 4.8 4.5 3.8  CL 102 101 97*  CO2 34* 37* 36*  BUN 22* 23* 24*  CREATININE 0.62 0.63 0.70  GLUCOSE 150* 154* 101*    Electrolytes  Recent Labs Lab 03/14/16 2042 03/15/16 0451 03/15/16 1621 03/16/16 0503 03/17/16 0510 03/18/16 0317  CALCIUM  --  8.5*  --  8.5* 8.8* 8.9  MG 2.0 2.2 2.4  --  2.6*  --   PHOS 3.1 3.0 3.3  --   --   --     CBC  Recent Labs Lab 03/15/16 0451 03/16/16 0503 03/17/16 0400  WBC 7.0 9.5 7.4  HGB 11.1* 10.3* 9.5*  HCT 33.3* 30.9* 28.4*  PLT 228 208 202    Coag's  Recent Labs Lab 03/14/16 1213  APTT 24  INR 0.98    Sepsis Markers  Recent Labs Lab 03/14/16 0321 03/14/16 1213 03/15/16 0451 03/16/16 0503  LATICACIDVEN 0.7 0.6  --   --   PROCALCITON  --  <0.10 <0.10 <0.10    ABG  Recent Labs Lab  03/17/16 1538 03/18/16 1800 03/18/16 2310  PHART 7.24* 7.24* 7.50*  PCO2ART 89* 88* 48  PO2ART 65* 87 66*    Liver Enzymes  Recent Labs Lab 03/14/16 0320 03/14/16 1213  AST 29 19  ALT 28 26  ALKPHOS 49 43  BILITOT 0.7 0.7  ALBUMIN 4.6 4.1    Cardiac Enzymes  Recent Labs Lab 03/14/16 0320 03/14/16 1213  TROPONINI <0.03 <0.03    Glucose  Recent Labs Lab 03/18/16 0349 03/18/16 0738 03/18/16 1117 03/18/16 1608 03/18/16 2007 03/18/16 2349  GLUCAP 103* 109* 103* 164* 169* 176*    Imaging No results found. STUDIES:  Echo 12/15>>EF=65%; PAP=45  CULTURES: Blood cultures: No growth to date Sputum 12/17 -negative  MRSA PCR negative.     ANTIBIOTICS: Ceftriaxone 12/15>>12/17 Azithromycin 12/18>>  SIGNIFICANT EVENTS: 12/15-Pt admitted to [facility] ICU due to acute on chronic hypercapnic respiratory failure secondary to AECOPD and Asthma Exacerbation 12/15-PCCM consulted pt mechanically intubated due to worsening respiratory failure  12/18 extensive b/l wheezing, unable to wean from vent LINES/TUBES: ETT 12/15>> Left internal jugular CVL 12/15>>  ASSESSMENT / PLAN:  PULMONARY A: Acute on chronic hypercapnic respiratory failure secondary to AECOPD and Asthma Exacerbation from Cocaine abuse requiring Mechanical Ventilation-Persistent episodes of hypoxemia Pulmonary hypertension.  Subsegmental atelectasis at RUL.  Continued hypercapnia and hypoxemia, Elevated peak pressure on ABG P:   Full vent support with current settings-PRVC 35/5/20/500 VAP bundle Aggressive Bronchodilator therapy Weaning trials as tolerated Continue steroids, taper VEC 10MG  xq1hr X3DOSES, change RR-20 , duonebs q1hr X3 doses CARDIOVASCULAR A:  Hypotension-resolved Sinus tachycardia-resolved Hx: Diastolic CHF and HTN P:  Continuehome dose of  Amlodipine Continue to hold atenolol, and hydrochlorothiazide Prn Levophed gtt to maintain map >65 Hemodynamic monitoring per ICU protocol RENAL A:   No acute issues P:   Trend BMP's  Replace electrolytes as indicated  Monitor uop  GASTROINTESTINAL A:   Hx: GERD P:   Continue  tube feeds Pepcid for PUD prophylaxis  Monitor for constipation  HEMATOLOGIC A:   No acute issues P:  Lovenox for VTE prophylaxis Trend CBC's Monitor for s/sx of bleeding Transfuse for hgb <7  INFECTIOUS A:   No acute issues P:   Trend WBC's and monitor fever curve Continue antibiotics Repeat cultures if patient becomes febrile  ENDOCRINE A:   No acute issues   P:   CBG's q4hrs  NEUROLOGIC A:   Hx: Anxiety P:   RASS goal: 0 to -1 Fentanyl gtt, prn Versed and Fentanyl to maintain RASS goal and  for pain management WUA daily    Joseph Johns,AG-ACNP Pulmonary & Critical Care

## 2016-03-19 NOTE — Progress Notes (Signed)
Restful day. Given duccolax suppossatory and miralax per OG tube - awaiting results. Sedation changed from Versed to propofol. O2 sats maintained in the high 90s and ETCO2 in the mid 30s..Marland Kitchen

## 2016-03-19 NOTE — Progress Notes (Signed)
Patient has had another episode where when her head is dropped she desats and requires bmv resuscitation. Patient's end tidal reading increases to 70/80s. Peak pressures on vent increases to 40's.  ABG was drawn during this episode tonight and cxr was obtained to verify tube placement. Patient has now returned back to normal baseline. o2 has been weaned. abg to be obtained.

## 2016-03-20 LAB — BASIC METABOLIC PANEL
Anion gap: 4 — ABNORMAL LOW (ref 5–15)
BUN: 23 mg/dL — AB (ref 6–20)
CHLORIDE: 104 mmol/L (ref 101–111)
CO2: 34 mmol/L — ABNORMAL HIGH (ref 22–32)
CREATININE: 0.57 mg/dL (ref 0.44–1.00)
Calcium: 8.8 mg/dL — ABNORMAL LOW (ref 8.9–10.3)
GFR calc Af Amer: >60 mL/min (ref >60)
GLUCOSE: 132 mg/dL — AB (ref 65–99)
Potassium: 3.7 mmol/L (ref 3.5–5.1)
SODIUM: 142 mmol/L (ref 135–145)

## 2016-03-20 LAB — GLUCOSE, CAPILLARY
GLUCOSE-CAPILLARY: 131 mg/dL — AB (ref 65–99)
Glucose-Capillary: 111 mg/dL — ABNORMAL HIGH (ref 65–99)
Glucose-Capillary: 112 mg/dL — ABNORMAL HIGH (ref 65–99)
Glucose-Capillary: 113 mg/dL — ABNORMAL HIGH (ref 65–99)
Glucose-Capillary: 113 mg/dL — ABNORMAL HIGH (ref 65–99)
Glucose-Capillary: 120 mg/dL — ABNORMAL HIGH (ref 65–99)

## 2016-03-20 LAB — PHOSPHORUS: PHOSPHORUS: 3.9 mg/dL (ref 2.5–4.6)

## 2016-03-20 LAB — MAGNESIUM: MAGNESIUM: 2.2 mg/dL (ref 1.7–2.4)

## 2016-03-20 MED ORDER — BISACODYL 10 MG RE SUPP
10.0000 mg | Freq: Every day | RECTAL | Status: DC | PRN
  Administered 2016-03-20: 10 mg via RECTAL
  Filled 2016-03-20: qty 1

## 2016-03-20 MED ORDER — SODIUM CHLORIDE 0.9 % IV SOLN
0.0000 ug/h | INTRAVENOUS | Status: DC
  Administered 2016-03-21 (×2): 300 ug/h via INTRAVENOUS
  Administered 2016-03-21: 375 ug/h via INTRAVENOUS
  Administered 2016-03-22: 300 ug/h via INTRAVENOUS
  Filled 2016-03-20 (×4): qty 50

## 2016-03-20 MED ORDER — FENTANYL 2500MCG IN NS 250ML (10MCG/ML) PREMIX INFUSION: 0.0000 ug/h | INTRAVENOUS | Status: DC

## 2016-03-20 MED ORDER — QUETIAPINE FUMARATE 25 MG PO TABS
25.0000 mg | ORAL_TABLET | Freq: Once | ORAL | Status: AC
  Administered 2016-03-20: 25 mg via ORAL
  Filled 2016-03-20: qty 1

## 2016-03-20 MED ORDER — QUETIAPINE FUMARATE 25 MG PO TABS
50.0000 mg | ORAL_TABLET | Freq: Every day | ORAL | Status: DC
  Administered 2016-03-20: 50 mg via ORAL
  Filled 2016-03-20: qty 2

## 2016-03-20 MED ORDER — FENTANYL CITRATE-NACL 2.5-0.9 MG/250ML-% IV SOLN: 0.0000 ug/h | INTRAVENOUS | Status: DC

## 2016-03-20 NOTE — Progress Notes (Signed)
PULMONARY / CRITICAL CARE MEDICINE   Name: Julie Foley MRN: [id number] DOB: 1956/02/09    ADMISSION DATE:  03/14/2016  HISTORY OF PRESENT ILLNESS:   This is a 60 yo female with a PMH of HTN, GERD, Severe COPD, Chronic respiratory failure, Asthma, Anxiety, Diastolic CHF.  [facility] on [facility]/15 with acute onset of dyspnea 12/15.  She has been seen multiple times in the emergency department for respiratory distress and has left AMA multiple times.  Upon this presentation to the ER she was tachycardic hr 126, tachypneic, and hypertensive systolic bp 174.  Per ER notes she did admit to smoking crack cocaine before onset of symptoms. She was admitted to ICU due to acute on chronic hypercapnic respiratory failure secondary to AECOPD and Asthma exacerbation requiring Bipap, however her respiratory status continued to decline and she was subsequently intubated.  PCCM contacted 12/15 for further management due to worsening acute on chronic hypercapnic respiratory failure requiring mechanical intubation.  SUBJECTIVE:  Patient had an episode where when she was put flat, she stopped ventilating and her heart rate was down to 40's which came up without any intervention. CXR did not show any significant changes from prior. Repeat gas was better.  Patient was afebrile during night and no other acute events overnight.  REVIEW OF SYSTEMS:   Unable to assess pt intubated   VITAL SIGNS: BP 118/70   Pulse 60   Temp 99 F (37.2 C) (Oral)   Resp 20   Ht 5\' 2"  (1.575 m)   Wt 83 kg (182 lb 15.7 oz)   SpO2 100%   BMI 33.47 kg/m   HEMODYNAMICS:    VENTILATOR SETTINGS: Vent Mode: PRVC FiO2 (%):  [35 %-100 %] 35 % Set Rate:  [20 bmp] 20 bmp Vt Set:  [500 mL] 500 mL PEEP:  [5 cmH20] 5 cmH20  INTAKE / OUTPUT: I/O last 3 completed shifts: In: 3815.9 [I.V.:2440.9; NG/GT:1375] Out: 2475 [Urine:2475]  PHYSICAL EXAMINATION: General:  Acutely ill appearing Caucasian female Neuro:  Sedated   following  Simple commands, moves all extremities HEENT: PERRLA, +corneal reflexes, neck is upple, JVD present  Cardiovascular:  Sinus tachycardia, s1s2, no M/R/G Lungs:decreased breath sounds throughout  , no wheezes, crackles, rhonchi noted Abdomen: +BS x4, soft, non tender, non distended Musculoskeletal:  Normal bulk and tone Extremities: +2 pulses bilaterally, no edema Skin: Intact no rashes or lesions  LABS:  BMET  Recent Labs Lab 03/17/16 0510 03/18/16 0317 03/19/16 0451  NA 141 138 140  K 4.5 3.8 3.9  CL 101 97* 100*  CO2 37* 36* 36*  BUN 23* 24* 27*  CREATININE 0.63 0.70 0.67  GLUCOSE 154* 101* 112*    Electrolytes  Recent Labs Lab 03/14/16 2042 03/15/16 0451 03/15/16 1621  03/17/16 0510 03/18/16 0317 03/19/16 0451  CALCIUM  --  8.5*  --   < > 8.8* 8.9 8.6*  MG 2.0 2.2 2.4  --  2.6*  --   --   PHOS 3.1 3.0 3.3  --   --   --   --   < > = values in this interval not displayed.  CBC  Recent Labs Lab 03/16/16 0503 03/17/16 0400 03/19/16 0451  WBC 9.5 7.4 8.1  HGB 10.3* 9.5* 10.2*  HCT 30.9* 28.4* 30.7*  PLT 208 202 232    Coag's  Recent Labs Lab 03/14/16 1213  APTT 24  INR 0.98    Sepsis Markers  Recent Labs Lab 03/14/16 0321 03/14/16  1213 03/15/16 0451 03/16/16 0503  LATICACIDVEN 0.7 0.6  --   --   PROCALCITON  --  <0.10 <0.10 <0.10    ABG  Recent Labs Lab 03/18/16 2310 03/19/16 2151 03/19/16 2315  PHART 7.50* 7.25* 7.48*  PCO2ART 48 85* 50*  PO2ART 66* 345* 96    Liver Enzymes  Recent Labs Lab 03/14/16 0320 03/14/16 1213  AST 29 19  ALT 28 26  ALKPHOS 49 43  BILITOT 0.7 0.7  ALBUMIN 4.6 4.1    Cardiac Enzymes  Recent Labs Lab 03/14/16 0320 03/14/16 1213  TROPONINI <0.03 <0.03    Glucose  Recent Labs Lab 03/18/16 2349 03/19/16 0808 03/19/16 1139 03/19/16 1558 03/19/16 1939 03/20/16 0002  GLUCAP 176* 116* 124* 171* 161* 110*    Imaging Dg Chest Port 1 View  Result Date:  03/19/2016 CLINICAL DATA:  ETT placement EXAM: PORTABLE CHEST 1 VIEW COMPARISON:  03/17/2016 FINDINGS: Endotracheal tube tip is approximately 4.2 cm superior to the carina. Esophageal tube tip is below the diaphragm but is not included. Left-sided central venous catheter tip overlies the SVC. The left lung is clear. The right CP angle is non included. A right apical opacity consistent with upper lobe atelectasis is unchanged. No pneumothorax. Stable heart size. IMPRESSION: 1. Support lines and tubes as described above 2. Persistent right upper lobe atelectasis Electronically Signed   By: Jasmine PangKim  Fujinaga M.D.   On: 03/19/2016 21:58   STUDIES:  Echo 12/15>>EF=65%; PAP=45  CULTURES: Blood cultures: No growth to date Sputum 12/17 -negative  MRSA PCR negative.    ANTIBIOTICS: Ceftriaxone 12/15>>12/17 Azithromycin 12/18>>  SIGNIFICANT EVENTS: 12/15-[facility] due to acute on chronic hypercapnic respiratory failure secondary to AECOPD and Asthma Exacerbation 12/15-PCCM consulted pt mechanically intubated due to worsening respiratory failure  12/18 extensive b/l wheezing, unable to wean from vent LINES/TUBES: ETT 12/15>> Left internal jugular CVL 12/15>>  ASSESSMENT / PLAN:  PULMONARY A: Acute on chronic hypercapnic respiratory failure secondary to AECOPD and Asthma Exacerbation from Cocaine abuse requiring Mechanical Ventilation-Persistent episodes of hypoxemia Pulmonary hypertension.  Subsegmental atelectasis at RUL.  Continued hypercapnia and hypoxemia, Elevated peak pressure on ABG P:   Full vent support with current settings-PRVC 35/5/20/500 VAP bundle Aggressive Bronchodilator therapy Weaning trials as tolerated Continue steroids, taper VEC 10MG  xq1hr X3DOSES, change RR-20 , duonebs q1hr X3 doses-12/20 CARDIOVASCULAR A:  Hypotension-resolved Sinus tachycardia-resolved Hx: Diastolic CHF and HTN P:  Continuehome dose of  Amlodipine Continue to hold atenolol, and  hydrochlorothiazide Prn Levophed gtt to maintain map >65 Hemodynamic monitoring per ICU protocol RENAL A:   No acute issues P:   Trend BMP's  Replace electrolytes as indicated  Monitor uop  GASTROINTESTINAL A:   Hx: GERD P:   Continue  tube feeds Pepcid for PUD prophylaxis  Monitor for constipation  HEMATOLOGIC A:   No acute issues P:  Lovenox for VTE prophylaxis Trend CBC's Monitor for s/sx of bleeding Transfuse for hgb <7  INFECTIOUS A:   No acute issues P:   Trend WBC's and monitor fever curve Continue antibiotics Repeat cultures if patient becomes febrile  ENDOCRINE A:   No acute issues   P:   CBG's q4hrs  NEUROLOGIC A:   Hx: Anxiety P:   RASS goal: 0 to -1 Fentanyl gtt, prnpropofol and Fentanyl to maintain RASS goal and for pain management WUA daily    Alison Breeding,AG-ACNP Pulmonary & Critical Care

## 2016-03-21 DIAGNOSIS — F1910 Other psychoactive substance abuse, uncomplicated: Secondary | ICD-10-CM

## 2016-03-21 DIAGNOSIS — R451 Restlessness and agitation: Secondary | ICD-10-CM

## 2016-03-21 LAB — PHOSPHORUS: Phosphorus: 4 mg/dL (ref 2.5–4.6)

## 2016-03-21 LAB — CBC
HCT: 30.1 % — ABNORMAL LOW (ref 35.0–47.0)
Hemoglobin: 10.3 g/dL — ABNORMAL LOW (ref 12.0–16.0)
MCH: 33.6 pg (ref 26.0–34.0)
MCHC: 34.1 g/dL (ref 32.0–36.0)
MCV: 98.4 fL (ref 80.0–100.0)
Platelets: 257 10*3/uL (ref 150–440)
RBC: 3.06 MIL/uL — ABNORMAL LOW (ref 3.80–5.20)
RDW: 12.9 % (ref 11.5–14.5)
WBC: 9.4 10*3/uL (ref 3.6–11.0)

## 2016-03-21 LAB — GLUCOSE, CAPILLARY
GLUCOSE-CAPILLARY: 122 mg/dL — AB (ref 65–99)
GLUCOSE-CAPILLARY: 144 mg/dL — AB (ref 65–99)
GLUCOSE-CAPILLARY: 198 mg/dL — AB (ref 65–99)
GLUCOSE-CAPILLARY: 153 mg/dL — AB (ref 65–99)
Glucose-Capillary: 97 mg/dL (ref 65–99)
Glucose-Capillary: 140 mg/dL — ABNORMAL HIGH (ref 65–99)

## 2016-03-21 LAB — BASIC METABOLIC PANEL
ANION GAP: 4 — AB (ref 5–15)
BUN: 22 mg/dL — ABNORMAL HIGH (ref 6–20)
CALCIUM: 9 mg/dL (ref 8.9–10.3)
CO2: 35 mmol/L — AB (ref 22–32)
Chloride: 104 mmol/L (ref 101–111)
Creatinine, Ser: 0.52 mg/dL (ref 0.44–1.00)
GFR calc Af Amer: >60 mL/min (ref >60)
GFR calc non Af Amer: >60 mL/min (ref >60)
GLUCOSE: 120 mg/dL — AB (ref 65–99)
Potassium: 3.4 mmol/L — ABNORMAL LOW (ref 3.5–5.1)
Sodium: 143 mmol/L (ref 135–145)

## 2016-03-21 LAB — MAGNESIUM: Magnesium: 2 mg/dL (ref 1.7–2.4)

## 2016-03-21 MED ORDER — BUDESONIDE 0.25 MG/2ML IN SUSP
0.2500 mg | Freq: Four times a day (QID) | RESPIRATORY_TRACT | Status: DC
  Administered 2016-03-21 – 2016-03-24 (×12): 0.25 mg via RESPIRATORY_TRACT
  Filled 2016-03-21 (×11): qty 2

## 2016-03-21 MED ORDER — HYDRALAZINE HCL 20 MG/ML IJ SOLN
10.0000 mg | INTRAMUSCULAR | Status: DC | PRN
  Administered 2016-03-21 – 2016-03-22 (×2): 10 mg via INTRAVENOUS
  Filled 2016-03-21 (×2): qty 1

## 2016-03-21 MED ORDER — POTASSIUM CHLORIDE 20 MEQ PO PACK
40.0000 meq | PACK | Freq: Once | ORAL | Status: AC
  Administered 2016-03-21: 40 meq via ORAL
  Filled 2016-03-21: qty 2

## 2016-03-21 MED ORDER — AMLODIPINE BESYLATE 10 MG PO TABS
10.0000 mg | ORAL_TABLET | Freq: Every day | ORAL | Status: DC
  Administered 2016-03-21: 10 mg
  Filled 2016-03-21 (×2): qty 1

## 2016-03-21 MED ORDER — ALBUTEROL SULFATE (2.5 MG/3ML) 0.083% IN NEBU: 2.5000 mg | INHALATION_SOLUTION | Freq: Four times a day (QID) | RESPIRATORY_TRACT | Status: DC

## 2016-03-21 MED ORDER — POLYETHYLENE GLYCOL 3350 17 G PO PACK
17.0000 g | PACK | Freq: Every day | ORAL | Status: DC
  Administered 2016-03-21: 17 g
  Filled 2016-03-21 (×2): qty 1

## 2016-03-21 MED ORDER — ALPRAZOLAM 0.5 MG PO TABS
0.5000 mg | ORAL_TABLET | Freq: Three times a day (TID) | ORAL | Status: DC
  Administered 2016-03-21 (×3): 0.5 mg
  Filled 2016-03-21 (×4): qty 1

## 2016-03-21 MED ORDER — FREE WATER
200.0000 mL | Freq: Three times a day (TID) | Status: DC
  Administered 2016-03-21 – 2016-03-22 (×4): 200 mL

## 2016-03-21 MED ORDER — POTASSIUM CHLORIDE 20 MEQ/15ML (10%) PO SOLN
40.0000 meq | Freq: Two times a day (BID) | ORAL | Status: AC
Start: 2016-03-21 — End: 2016-03-21
  Administered 2016-03-21 (×2): 40 meq
  Filled 2016-03-21 (×3): qty 30

## 2016-03-21 MED ORDER — FAMOTIDINE 20 MG PO TABS
20.0000 mg | ORAL_TABLET | Freq: Two times a day (BID) | ORAL | Status: DC
  Administered 2016-03-21 (×2): 20 mg
  Filled 2016-03-21 (×3): qty 1

## 2016-03-21 MED ORDER — IPRATROPIUM-ALBUTEROL 0.5-2.5 (3) MG/3ML IN SOLN
3.0000 mL | Freq: Four times a day (QID) | RESPIRATORY_TRACT | Status: DC
  Administered 2016-03-21 – 2016-03-24 (×12): 3 mL via RESPIRATORY_TRACT
  Filled 2016-03-21 (×12): qty 3

## 2016-03-21 MED ORDER — ACETAMINOPHEN 325 MG PO TABS: 650.0000 mg | ORAL_TABLET | Freq: Four times a day (QID) | ORAL | Status: DC | PRN

## 2016-03-21 MED ORDER — DEXMEDETOMIDINE HCL IN NACL 400 MCG/100ML IV SOLN
0.0000 ug/kg/h | INTRAVENOUS | Status: DC
  Administered 2016-03-21: 0.4 ug/kg/h via INTRAVENOUS
  Administered 2016-03-21 (×4): 2 ug/kg/h via INTRAVENOUS
  Administered 2016-03-22: 0.8 ug/kg/h via INTRAVENOUS
  Administered 2016-03-22: 0.6 ug/kg/h via INTRAVENOUS
  Administered 2016-03-22 – 2016-03-23 (×2): 0.4 ug/kg/h via INTRAVENOUS
  Filled 2016-03-21 (×10): qty 100
  Filled 2016-03-21: qty 50

## 2016-03-21 MED ORDER — ALBUTEROL SULFATE (2.5 MG/3ML) 0.083% IN NEBU
2.5000 mg | INHALATION_SOLUTION | RESPIRATORY_TRACT | Status: DC | PRN
  Administered 2016-03-23: 2.5 mg via RESPIRATORY_TRACT
  Filled 2016-03-21: qty 3

## 2016-03-21 NOTE — Progress Notes (Signed)
PULMONARY / CRITICAL CARE MEDICINE   Name: Julie BunSylvia L Crossan MRN: [id number] DOB: July 24, 1955    ADMISSION DATE:  03/14/2016  HISTORY OF PRESENT ILLNESS:   This is a 60 yo female with a PMH of HTN, GERD, Severe COPD, Chronic respiratory failure, Asthma, Anxiety, Diastolic CHF.  [facility]/15 with acute onset of dyspnea 12/15.  She has been seen multiple times in the emergency department for respiratory distress and has left AMA multiple times.  Upon this presentation to the ER she was tachycardic hr 126, tachypneic, and hypertensive systolic bp 174.  Per ER notes she did admit to smoking crack cocaine before onset of symptoms. She was admitted to ICU due to acute on chronic hypercapnic respiratory failure secondary to AECOPD and Asthma exacerbation requiring Bipap, however her respiratory status continued to decline and she was subsequently intubated.  PCCM contacted 12/15 for further management due to worsening acute on chronic hypercapnic respiratory failure requiring mechanical intubation.  SUBJECTIVE: Patient remains lightly sedated on vent. Follows command. Afebrile. No Acute events overnight .  REVIEW OF SYSTEMS:   Unable to assess pt intubated   VITAL SIGNS: BP 126/73   Pulse (!) 53   Temp 99.3 F (37.4 C) (Oral)   Resp 20   Ht 5\' 2"  (1.575 m)   Wt 84.1 kg (185 lb 6.5 oz)   SpO2 98%   BMI 33.91 kg/m   HEMODYNAMICS:    VENTILATOR SETTINGS: Vent Mode: PRVC FiO2 (%):  [35 %] 35 % Set Rate:  [20 bmp] 20 bmp Vt Set:  [500 mL] 500 mL PEEP:  [5 cmH20] 5 cmH20 Plateau Pressure:  [17 cmH20] 17 cmH20  INTAKE / OUTPUT: I/O last 3 completed shifts: In: 4243.6 [I.V.:2596.1; NG/GT:1647.5] Out: 3300 [Urine:3300]  PHYSICAL EXAMINATION: General:  Acutely ill appearing female Neuro:  Lightly Sedated  following  Simple commands, moves all extremities HEENT: PERRLA, +corneal reflexes, neck is supple,  No JVD appreciated Cardiovascular:  Sinus tachycardia, s1s2, no  M/R/G Lungs:diminished breath sounds   , no wheezes, crackles, rhonchi noted Abdomen: +BS x4, soft, non tender, non distended Musculoskeletal:  Normal bulk and tone Extremities: +2 pulses bilaterally, no edema Skin: Intact no rashes or lesions  LABS:  BMET  Recent Labs Lab 03/18/16 0317 03/19/16 0451 03/20/16 0501  NA 138 140 142  K 3.8 3.9 3.7  CL 97* 100* 104  CO2 36* 36* 34*  Foley 24* 27* 23*  CREATININE 0.70 0.67 0.57  GLUCOSE 101* 112* 132*    Electrolytes  Recent Labs Lab 03/15/16 0451 03/15/16 1621  03/17/16 0510 03/18/16 0317 03/19/16 0451 03/20/16 0501  CALCIUM 8.5*  --   < > 8.8* 8.9 8.6* 8.8*  MG 2.2 2.4  --  2.6*  --   --  2.2  PHOS 3.0 3.3  --   --   --   --  3.9  < > = values in this interval not displayed.  CBC  Recent Labs Lab 03/16/16 0503 03/17/16 0400 03/19/16 0451  WBC 9.5 7.4 8.1  HGB 10.3* 9.5* 10.2*  HCT 30.9* 28.4* 30.7*  PLT 208 202 232    Coag's  Recent Labs Lab 03/14/16 1213  APTT 24  INR 0.98    Sepsis Markers  Recent Labs Lab 03/14/16 1213 03/15/16 0451 03/16/16 0503  LATICACIDVEN 0.6  --   --   PROCALCITON <0.10 <0.10 <0.10    ABG  Recent Labs Lab 03/18/16 2310 03/19/16 2151 03/19/16 2315  PHART  7.50* 7.25* 7.48*  PCO2ART 48 85* 50*  PO2ART 66* 345* 96    Liver Enzymes  Recent Labs Lab 03/14/16 1213  AST 19  ALT 26  ALKPHOS 43  BILITOT 0.7  ALBUMIN 4.1    Cardiac Enzymes  Recent Labs Lab 03/14/16 1213  TROPONINI <0.03    Glucose  Recent Labs Lab 03/20/16 0724 03/20/16 1128 03/20/16 1651 03/20/16 1959 03/20/16 2353 03/21/16 0401  GLUCAP 113* 131* 111* 113* 112* 122*    Imaging No results found. STUDIES:  Echo 12/15>>EF=65%; PAP=45  CULTURES: Blood cultures: No growth to date Sputum 12/17 -negative  MRSA PCR negative.    ANTIBIOTICS: Ceftriaxone 12/15>>12/17 Azithromycin 12/18>>12/21  SIGNIFICANT EVENTS: 12/15-Pt admitted to [facility] ICU due to acute on chronic  hypercapnic respiratory failure secondary to AECOPD and Asthma Exacerbation 12/15-PCCM consulted pt mechanically intubated due to worsening respiratory failure  12/18 extensive b/l wheezing, unable to wean from vent LINES/TUBES: ETT 12/15>> Left internal jugular CVL 12/15>>  ASSESSMENT / PLAN:  PULMONARY A: Acute on chronic hypercapnic respiratory failure secondary to AECOPD and Asthma Exacerbation from Cocaine abuse requiring Mechanical Ventilation-Persistent episodes of hypoxemia Pulmonary hypertension.  Subsegmental atelectasis at RUL.  Continued hypercapnia and hypoxemia, Elevated peak pressure on ABG P:   Full vent support with current settings-PRVC 35/5/20/500 VAP bundle Aggressive Bronchodilator therapy Weaning trials as tolerated Continue steroids, taper VEC 10MG  xq1hr X3DOSES, change RR-20 , duonebs q1hr X3 doses-12/20 CARDIOVASCULAR A:  Hypotension-resolved Sinus tachycardia-resolved Hx: Diastolic CHF and HTN P:  Continuehome dose of  Amlodipine Continue to hold atenolol, and hydrochlorothiazide Prn Levophed gtt to maintain map >65 Hemodynamic monitoring per ICU protocol RENAL A:   No acute issues P:   Trend BMP's  Replace electrolytes as indicated  Monitor uop  GASTROINTESTINAL A:   Hx: GERD P:   Continue  tube feeds Pepcid for PUD prophylaxis  Monitor for constipation  HEMATOLOGIC A:   No acute issues P:  Lovenox for VTE prophylaxis Trend CBC's Monitor for s/sx of bleeding Transfuse for hgb <7  INFECTIOUS A:   No acute issues P:   Trend WBC's and monitor fever curve Repeat cultures if patient becomes febrile Discontinued Azithromycin-12/21 ENDOCRINE A:   No acute issues   P:   CBG's q4hrs  NEUROLOGIC A:   Hx: Anxiety P:   RASS goal: 0 to -1 Continue Seroquel Continue Alprazolam Fentanyl gtt, prnpropofol and Fentanyl to maintain RASS goal and for pain management WUA daily  CCM time: 35 mins  The above time includes time  spent in consultation with patient and/or family members and reviewing care plan on multidisciplinary rounds  Billy Fischeravid Luciana Cammarata, MD PCCM service Mobile [phone] Pager [phone]

## 2016-03-21 NOTE — Progress Notes (Signed)
Initial Nutrition Assessment  DOCUMENTATION CODES:   Obesity unspecified  INTERVENTION:  -TF: pt on diprivan at present but not significant sources of calories; recommend continuing current TF regimen of Vital High Protein at 50 ml/hr with Prostat daily. Continue to assess -No BM since admission, noted bowel regimen modified this AM, continue to assess  NUTRITION DIAGNOSIS:   Inadequate oral intake related to acute illness as evidenced by NPO status.  Being addressed via TF  GOAL:   Provide needs based on ASPEN/SCCM guidelines  MONITOR:   TF tolerance, Vent status, Labs, Weight trends  REASON FOR ASSESSMENT:   Consult Enteral/tube feeding initiation and management  ASSESSMENT:    60 yo female admitted with acute on chronic respiratory failure secondary to AECOPD and asthma exacerbation requiring intubation on 12/15   Pt remains on vent support Tolerating Vital High Protein at rate of 50 ml/hr, Prostat daily  Labs: potassium 3.4 (supplemented) Meds: solumedrol, diprivan (7.5 ml/hr provides 198 kcals in 24 hours), precedex, fentanyl  Diet Order:   NPO  Skin:  Reviewed, no issues  Last BM:  no BM since admission (only 2 smears)  Height:   Ht Readings from Last 1 Encounters:  03/14/16 5\' 2"  (1.575 m)    Weight:   Wt Readings from Last 1 Encounters:  03/21/16 184 lb 8.4 oz (83.7 kg)   Filed Weights   03/19/16 0500 03/20/16 0500 03/21/16 0445  Weight: 182 lb 15.7 oz (83 kg) 185 lb 6.5 oz (84.1 kg) 184 lb 8.4 oz (83.7 kg)    BMI:  Body mass index is 33.75 kg/m.  Estimated Nutritional Needs:   Kcal:  [phone] kcals  Protein:  104-130 g  Fluid:  >/= 1.5 L  EDUCATION NEEDS:   No education needs identified at this time  Romelle StarcherCate Mattson Dayal MS, RD, LDN [phone] Pager  [phone] Weekend/On-Call Pager

## 2016-03-22 ENCOUNTER — Inpatient Hospital Stay: Payer: Medicaid Other

## 2016-03-22 LAB — BASIC METABOLIC PANEL
ANION GAP: 5 (ref 5–15)
BUN: 24 mg/dL — ABNORMAL HIGH (ref 6–20)
CALCIUM: 9 mg/dL (ref 8.9–10.3)
CO2: 32 mmol/L (ref 22–32)
Chloride: 104 mmol/L (ref 101–111)
Creatinine, Ser: 0.53 mg/dL (ref 0.44–1.00)
Glucose, Bld: 127 mg/dL — ABNORMAL HIGH (ref 65–99)
Potassium: 3.9 mmol/L (ref 3.5–5.1)
Sodium: 141 mmol/L (ref 135–145)

## 2016-03-22 LAB — GLUCOSE, CAPILLARY
GLUCOSE-CAPILLARY: 118 mg/dL — AB (ref 65–99)
GLUCOSE-CAPILLARY: 85 mg/dL (ref 65–99)
Glucose-Capillary: 119 mg/dL — ABNORMAL HIGH (ref 65–99)
Glucose-Capillary: 131 mg/dL — ABNORMAL HIGH (ref 65–99)
Glucose-Capillary: 125 mg/dL — ABNORMAL HIGH (ref 65–99)

## 2016-03-22 MED ORDER — PREDNISONE 20 MG PO TABS
40.0000 mg | ORAL_TABLET | Freq: Every day | ORAL | Status: DC
Start: 2016-03-23 — End: 2016-03-24
  Administered 2016-03-23 – 2016-03-24 (×2): 40 mg via ORAL
  Filled 2016-03-22 (×2): qty 2

## 2016-03-22 MED ORDER — ORAL CARE MOUTH RINSE: 15.0000 mL | Freq: Two times a day (BID) | OROMUCOSAL | Status: DC

## 2016-03-22 MED ORDER — ALPRAZOLAM 0.5 MG PO TABS
0.5000 mg | ORAL_TABLET | Freq: Three times a day (TID) | ORAL | Status: DC
  Administered 2016-03-22 (×2): 0.5 mg via ORAL
  Filled 2016-03-22 (×2): qty 1

## 2016-03-22 MED ORDER — FENTANYL CITRATE (PF) 100 MCG/2ML IJ SOLN: 25.0000 ug | INTRAMUSCULAR | Status: DC | PRN

## 2016-03-22 MED ORDER — LORAZEPAM 2 MG/ML IJ SOLN: 0.5000 mg | INTRAMUSCULAR | Status: DC | PRN

## 2016-03-22 MED ORDER — ACETAMINOPHEN 325 MG PO TABS: 650.0000 mg | ORAL_TABLET | Freq: Four times a day (QID) | ORAL | Status: DC | PRN

## 2016-03-22 MED ORDER — HYDRALAZINE HCL 20 MG/ML IJ SOLN: 10.0000 mg | INTRAMUSCULAR | Status: DC | PRN

## 2016-03-22 MED ORDER — POLYETHYLENE GLYCOL 3350 17 G PO PACK
17.0000 g | PACK | Freq: Every day | ORAL | Status: DC
  Administered 2016-03-22: 17 g via ORAL

## 2016-03-22 MED ORDER — AMLODIPINE BESYLATE 10 MG PO TABS
10.0000 mg | ORAL_TABLET | Freq: Every day | ORAL | Status: DC
  Administered 2016-03-23: 10 mg via ORAL
  Filled 2016-03-22: qty 1

## 2016-03-22 NOTE — Progress Notes (Signed)
Patient extubated Per Dr. Bard HerbertSimmonds by respiratory- John.  Patient alert and oriented.  Per Dr. Bard HerbertSimmonds patient placed back on Precedex drip.  Patient on 4 liters of oxygen sating 97%.  Patient resting at this time.

## 2016-03-22 NOTE — Progress Notes (Signed)
Patient alert and oriented. Vitals stable. No complaint of pain- patient on and off bipap throughout the day per patient.  Oxygen saturation stable on and off bipap.  Patient continues to have Precedex drip- patient is calm and relaxed.

## 2016-03-22 NOTE — Progress Notes (Signed)
Extubated per MD order without complications to 4lnc 

## 2016-03-22 NOTE — Progress Notes (Signed)
CH was paged at the request of the agitated PT who wanted to see "the preacher." [facility] had been given medication and was asleep. CH available if PT requests again when awake.

## 2016-03-22 NOTE — Progress Notes (Signed)
PULMONARY / CRITICAL CARE MEDICINE   Name: Julie Foley MRN: [id number] DOB: May 27, 1955    ADMISSION DATE:  03/14/2016  PT PROFILE:   [age] F with hx of HTN, GERD, Severe COPD, diastolic CHF adm 40/9812/15 with respiratory distress after smoking crack cocaine. Initially admitted to ICU/SDU by hospitalist service on BiPAP and subsequently required intubation.   MAJOR EVENTS/TEST RESULTS: 12/15 Echocardiogram: LVEF 65%; RVSP 45 mmHg  INDWELLING DEVICES:: ETT 12/15 >> 12/23 L IJ CVL 12/15 >>   MICRO DATA: MRSA PCR 12/15 >> NEG Resp 12/17 >> NEG  ANTIMICROBIALS:  Ceftriaxone 12/15>>12/17 Azithromycin 12/18>>12/21   SUBJ: RASS 0, + F/C. Passed SBT. Extubated and tolerating well  VITAL SIGNS: BP (!) 151/80   Pulse 89   Temp 98 F (36.7 C) (Oral)   Resp 20   Ht 5\' 2"  (1.575 m)   Wt 184 lb 8.4 oz (83.7 kg)   SpO2 97%   BMI 33.75 kg/m   HEMODYNAMICS:    VENTILATOR SETTINGS: Vent Mode: PRVC FiO2 (%):  [30 %] 30 % Set Rate:  [20 bmp] 20 bmp Vt Set:  [500 mL] 500 mL PEEP:  [5 cmH20] 5 cmH20 Plateau Pressure:  [20 cmH20] 20 cmH20  INTAKE / OUTPUT: I/O last 3 completed shifts: In: 4116.2 [P.O.:200; I.V.:1866.2; NG/GT:2050] Out: 3900 [Urine:3550; Emesis/NG output:350]  PHYSICAL EXAMINATION: Neuro: No focal deficits HEENT: WNL Cardiovascular: reg, no M Lungs: no wheezes Abdomen: obese, soft, + BS Extremities: no edema  LABS:  BMET  Recent Labs Lab 03/20/16 0501 03/21/16 0440 03/22/16 0617  NA 142 143 141  K 3.7 3.4* 3.9  CL 104 104 104  CO2 34* 35* 32  BUN 23* 22* 24*  CREATININE 0.57 0.52 0.53  GLUCOSE 132* 120* 127*    Electrolytes  Recent Labs Lab 03/17/16 0510  03/20/16 0501 03/21/16 0440 03/22/16 0617  CALCIUM 8.8*  < > 8.8* 9.0 9.0  MG 2.6*  --  2.2 2.0  --   PHOS  --   --  3.9 4.0  --   < > = values in this interval not displayed.  CBC  Recent Labs Lab 03/17/16 0400 03/19/16 0451 03/21/16 0440  WBC 7.4 8.1 9.4  HGB 9.5* 10.2*  10.3*  HCT 28.4* 30.7* 30.1*  PLT 202 232 257    Coag's No results for input(s): APTT, INR in the last 168 hours.  Sepsis Markers  Recent Labs Lab 03/16/16 0503  PROCALCITON <0.10    ABG  Recent Labs Lab 03/18/16 2310 03/19/16 2151 03/19/16 2315  PHART 7.50* 7.25* 7.48*  PCO2ART 48 85* 50*  PO2ART 66* 345* 96    Liver Enzymes No results for input(s): AST, ALT, ALKPHOS, BILITOT, ALBUMIN in the last 168 hours.  Cardiac Enzymes No results for input(s): TROPONINI, PROBNP in the last 168 hours.  Glucose  Recent Labs Lab 03/21/16 1950 03/21/16 2326 03/22/16 0330 03/22/16 0733 03/22/16 1158 03/22/16 1601  GLUCAP 153* 140* 119* 118* 131* 125*    CXR: Mild RUL atx    ASSESSMENT / PLAN:  PULMONARY A: Acute hypoxic resp failure  Status asthmaticus COPD P:   Monitor in ICU post extubation PRN BiPAP Supplemental O2 Continue nebulized steroids and bronchodilators Transition to prednisone  CARDIOVASCULAR A:  Hypotension, controlled Sinus tachycardia, resolved Hx: Diastolic CHF and HTN P:  Continue amlodipine Holding home meds of atenolol, and hydrochlorothiazide Prn hydralazine to maintain SBP < 160 mmHg  RENAL A:   No acute issues P:   Monitor BMET intermittently  Monitor I/Os Correct electrolytes as indicated  GASTROINTESTINAL A:   Hx: GERD P:   SUP: N/I post extubation NPO post extubation Consider diet 12/24  HEMATOLOGIC A:   Mild ICU acquired anemia P:  DVT px: enoxaparin Monitor CBC intermittently Transfuse per usual guidelines  INFECTIOUS A:   No acute issues P:   Monitor temp, WBC count Micro and abx as above  ENDOCRINE A:   Very mild hyperglycemia   P:   Change CBGs to q 8 hrs  NEUROLOGIC A:   Poly substance abuse Very high tolerance to opiates P:   RASS goal: 0  Cont dexmedetomidine post extubation PRN fentanyl PRN alprazolam  CCM time: 45 mins  The above time includes multiple rechecks through the  day to assess tolerance to extubation  Billy Fischeravid Simonds, MD PCCM service Mobile [phone] Pager [phone]

## 2016-03-23 LAB — GLUCOSE, CAPILLARY
GLUCOSE-CAPILLARY: 148 mg/dL — AB (ref 65–99)
GLUCOSE-CAPILLARY: 76 mg/dL (ref 65–99)
GLUCOSE-CAPILLARY: 77 mg/dL (ref 65–99)
Glucose-Capillary: 83 mg/dL (ref 65–99)

## 2016-03-23 MED ORDER — POLYETHYLENE GLYCOL 3350 17 G PO PACK: 17.0000 g | PACK | Freq: Every day | ORAL | Status: DC | PRN

## 2016-03-23 MED ORDER — ALPRAZOLAM 0.5 MG PO TABS: 0.5000 mg | ORAL_TABLET | Freq: Three times a day (TID) | ORAL | Status: DC | PRN

## 2016-03-23 MED ORDER — METRONIDAZOLE 500 MG PO TABS
500.0000 mg | ORAL_TABLET | Freq: Three times a day (TID) | ORAL | Status: DC
  Administered 2016-03-23 – 2016-03-24 (×4): 500 mg via ORAL
  Filled 2016-03-23 (×4): qty 1

## 2016-03-23 NOTE — Progress Notes (Signed)
PT Cancellation Note  Patient Details Name: Julie BunSylvia L Foley MRN: [id number] DOB: 1956-02-12   Cancelled Treatment:    Reason Eval/Treat Not Completed: Fatigue/lethargy limiting ability to participate (pt agreed to let PT come back in a couple hours).  Just arrived from ICU and is tired from just using BSC.   Ivar DrapeStout, Emlyn Maves E 03/23/2016, 1:43 PM   Samul Dadauth Ramie Erman, PT MS Acute Rehab Dept. Number: [facility] [phone] and [facility] [phone]

## 2016-03-23 NOTE — Evaluation (Signed)
Physical Therapy Evaluation Patient Details Name: Maryruth BunSylvia L Brownrigg MRN: [id number] DOB: 03-29-1956 Today's Date: 03/23/2016   History of Present Illness  60 y/o female here with COPD exacerbation and needing intubation.    Clinical Impression  Pt did well with bed mobility and ambulation and will be safe to return home.  She is not interested in and does not need further PT intervention at home.  She uses 4 liters at baseline and was able to walk ~200 ft w/o AD and w/o O2 dropping below 93%.  Pt eager to go home as soon as possible.     Follow Up Recommendations No PT follow up    Equipment Recommendations       Recommendations for Other Services       Precautions / Restrictions Restrictions Weight Bearing Restrictions: No      Mobility  Bed Mobility Overal bed mobility: Independent                Transfers Overall transfer level: Independent                  Ambulation/Gait Ambulation/Gait assistance: Independent Ambulation Distance (Feet): 200 Feet Assistive device: None (4 liters O2)       General Gait Details: Pt with guarded but consistent cadence and reasonable speed and confidence. No LOBs and only minimal fatigue (O2 remains in the mid 90s the entire time)  Stairs            Wheelchair Mobility    Modified Rankin (Stroke Patients Only)       Balance Overall balance assessment: Independent                                           Pertinent Vitals/Pain Pain Assessment:  (chronic pain)    Home Living Family/patient expects to be discharged to:: Private residence Living Arrangements: Spouse/significant other (has aides QD) Available Help at Discharge: Family                  Prior Function Level of Independence: Independent               Hand Dominance        Extremity/Trunk Assessment   Upper Extremity Assessment Upper Extremity Assessment: Overall WFL for tasks assessed    Lower  Extremity Assessment Lower Extremity Assessment: Overall WFL for tasks assessed       Communication   Communication: No difficulties  Cognition Arousal/Alertness: Awake/alert Behavior During Therapy: WFL for tasks assessed/performed Overall Cognitive Status: Within Functional Limits for tasks assessed                      General Comments      Exercises     Assessment/Plan    PT Assessment Patent does not need any further PT services  PT Problem List            PT Treatment Interventions      PT Goals (Current goals can be found in the Care Plan section)  Acute Rehab PT Goals Patient Stated Goal: go home    Frequency     Barriers to discharge        Co-evaluation               End of Session Equipment Utilized During Treatment: Oxygen (4 liters, uses 4 at baseline) Activity Tolerance: Patient tolerated  treatment well Patient left: with bed alarm set;with call bell/phone within reach;with family/visitor present           Time: 1348-1410 PT Time Calculation (min) (ACUTE ONLY): 22 min   Charges:   PT Evaluation $PT Eval Low Complexity: 1 Procedure     PT G Codes:        Malachi ProGalen R Montrell Cessna, DPT 03/23/2016, 4:03 PM

## 2016-03-23 NOTE — Progress Notes (Signed)
Pt has remained alert and oriented with no c/o pain. Lung sounds with bilat exp wheezes, diminished in the bases. RR even and unlabored. Pt on 4LNC upon shift assessment. Bipap applied once for anxiety leading to desaturation, no distress noted other than the desaturation. PRN Ativan ordered, but not administered-pt with hx of anxiety-HR WNL. PRN albuterol given. Pt repositioned to dangle on side of bed with proper breathing techniques. Successfully transitioned to [facility]5LNC with no distress noted. NSR on cardiac monitor.  Pt with orders to transfer to the floor.

## 2016-03-23 NOTE — Progress Notes (Addendum)
PULMONARY / CRITICAL CARE MEDICINE   Name: Julie BunSylvia L Reinders MRN: [id number] DOB: 31-Jul-1955    ADMISSION DATE:  03/14/2016  PT PROFILE:   [age] F with hx of HTN, GERD, Severe COPD, diastolic CHF adm 40/9812/15 with respiratory distress after smoking crack cocaine. Initially admitted to ICU/SDU by hospitalist service on BiPAP and subsequently required intubation.   MAJOR EVENTS/TEST RESULTS: 12/15 Echocardiogram: LVEF 65%; RVSP 45 mmHg  INDWELLING DEVICES:: ETT 12/15 >> 12/23 L IJ CVL 12/15 >> 12/24  MICRO DATA: MRSA PCR 12/15 >> NEG Resp 12/17 >> NEG  ANTIMICROBIALS:  Ceftriaxone 12/15>>12/17 Azithromycin 12/18>>12/21 Metronidazole 12/24 >>   SUBJ: RASS 0, + F/C. No distress on HC @ 4 lpm [place] which is the flow she wears at home. Asking to go home but she has not been out of bed yet.   RN reports very malodorous vaginal discharge  VITAL SIGNS: BP (!) 113/99 (BP Location: Left Arm)   Pulse 77   Temp 98 F (36.7 C) (Oral)   Resp (!) 21   Ht 5\' 2"  (1.575 m)   Wt 187 lb 13.3 oz (85.2 kg)   SpO2 (!) 83%   BMI 34.35 kg/m   HEMODYNAMICS:    VENTILATOR SETTINGS: FiO2 (%):  [30 %] 30 %  INTAKE / OUTPUT: I/O last 3 completed shifts: In: 2366.1 [P.O.:560; I.V.:860.3; Other:20; NG/GT:925.8] Out: 1630 [Urine:1280; Emesis/NG output:350]  PHYSICAL EXAMINATION: Neuro: No focal deficits HEENT: WNL Cardiovascular: reg, no M Lungs: scattered wheezes Abdomen: obese, soft, + BS Extremities: no edema  LABS:  BMET  Recent Labs Lab 03/20/16 0501 03/21/16 0440 03/22/16 0617  NA 142 143 141  K 3.7 3.4* 3.9  CL 104 104 104  CO2 34* 35* 32  Foley 23* 22* 24*  CREATININE 0.57 0.52 0.53  GLUCOSE 132* 120* 127*    Electrolytes  Recent Labs Lab 03/17/16 0510  03/20/16 0501 03/21/16 0440 03/22/16 0617  CALCIUM 8.8*  < > 8.8* 9.0 9.0  MG 2.6*  --  2.2 2.0  --   PHOS  --   --  3.9 4.0  --   < > = values in this interval not displayed.  CBC  Recent Labs Lab  03/17/16 0400 03/19/16 0451 03/21/16 0440  WBC 7.4 8.1 9.4  HGB 9.5* 10.2* 10.3*  HCT 28.4* 30.7* 30.1*  PLT 202 232 257    Coag's No results for input(s): APTT, INR in the last 168 hours.  Sepsis Markers No results for input(s): LATICACIDVEN, PROCALCITON, O2SATVEN in the last 168 hours.  ABG  Recent Labs Lab 03/18/16 2310 03/19/16 2151 03/19/16 2315  PHART 7.50* 7.25* 7.48*  PCO2ART 48 85* 50*  PO2ART 66* 345* 96    Liver Enzymes No results for input(s): AST, ALT, ALKPHOS, BILITOT, ALBUMIN in the last 168 hours.  Cardiac Enzymes No results for input(s): TROPONINI, PROBNP in the last 168 hours.  Glucose  Recent Labs Lab 03/22/16 1158 03/22/16 1601 03/22/16 2352 03/23/16 0008 03/23/16 0145 03/23/16 0729  GLUCAP 131* 125* 85 77 76 83    CXR: NNF    ASSESSMENT: Acute on chronic hypoxic resp failure  Status asthmaticus, resolving COPD Hypotension, controlled Sinus tachycardia, resolved Hx: Diastolic CHF and HTN Hx: GERD Mild ICU acquired anemia Very mild hyperglycemia   Poly substance abuse Suspected bacterial vaginosis  PLAN/REC:   Transfer to med-surg Cont supplemental O2 Continue nebulized steroids and bronchodilators Transition to prednisone and taper to off over next 5-7 days Cont PRN fentanyl and alprazolam Advance  diet and activity PT eval ordered Metronidazole initiated 12/24  Discussed with Dr Clint GuyHower. [facility] to assume primary duties of care as of AM 12/25. Please note that [facility] (Dr Tula NakayamaBice)   Billy Fischeravid Desmen Schoffstall, MD PCCM service Mobile [phone] Pager [phone]

## 2016-03-23 NOTE — Progress Notes (Signed)
Patient to be transferred out of ICU, followed by hospitalist team starting in the morning 03/24/16 Briefly admit 03/14/16 acute on chronic respiratory failure (3L baseline) copd exacerbation. Smoking crack prior to admission  Declined Required intubation - extubated 12/23 Still on tapering doses of steroids/ bronchodilators due to ongoing symptoms

## 2016-03-24 MED ORDER — PREDNISONE 10 MG PO TABS: 40.0000 mg | ORAL_TABLET | Freq: Every day | ORAL | 0 refills | Status: AC

## 2016-03-24 MED ORDER — METRONIDAZOLE 500 MG PO TABS: 500.0000 mg | ORAL_TABLET | Freq: Three times a day (TID) | ORAL | 0 refills | Status: AC

## 2016-03-24 NOTE — Care Management (Signed)
HRI referral made to [facility] home [facility] per Dr. Judithann SheenSparks PA which is more appropriate based on visit history/disease process.

## 2016-03-24 NOTE — Discharge Instructions (Signed)
Use your oxygen, nebs and inhalers as before ?

## 2016-03-24 NOTE — Progress Notes (Signed)
Alert and oriented. Vital signs stable . No signs of acute distress. Discharge instructions given. Patient verbalized understanding. No other issues noted at this time.    

## 2016-03-24 NOTE — Care Management (Addendum)
Patient has chronic O2 per CM note with [facility]. She is open to [facility] however inpatient PT is not recommending HHPT as she ambulated ~200 feet. [facility] home health notified. MD paged to make sure. No further RNCM needs. Looking back home health has been ordered. I have updated Julie BraunKaren with [facility] home health. I have attempted to contact patient in her room but apparently she has already discharged. Message left on her home number. Case closed.

## 2016-03-24 NOTE — Care Management (Signed)
Patient is open to [facility]. Inpatient PT is not recommending home PT. MD paged. [facility] has been notified.

## 2016-03-24 NOTE — Progress Notes (Signed)
ED case manager was notified of resume home health order. Per case manager, all home health referrals will be picked up tomorrow due to holiday. Charge nurse, Jaimie aware and will follow up tomorrow.

## 2016-03-24 NOTE — Discharge Summary (Signed)
[facility] -  at [facility]   PATIENT NAME: Julie Foley    MR#:  [id number]  DATE OF BIRTH:  08/14/1955  DATE OF ADMISSION:  03/14/2016 ADMITTING PHYSICIAN: Adrian SaranSital Mody, MD  DATE OF DISCHARGE: 03/24/16  PRIMARY CARE PHYSICIAN: [facility] [facility]    ADMISSION DIAGNOSIS:  Acute respiratory distress [R06.03] Cocaine abuse [F14.10] Respiratory distress [R06.00]  DISCHARGE DIAGNOSIS:  Acute on chronic CoPD failure/respiratory failure requiring mechanical ventilator HTn SECONDARY DIAGNOSIS:   Past Medical History:  Diagnosis Date  . Anxiety   . Asthma   . CHF (congestive heart failure) (HCC)   . Chronic respiratory failure (HCC)   . COPD (chronic obstructive pulmonary disease) (HCC)   . Depression   . GERD (gastroesophageal reflux disease)   . HTN (hypertension)     HOSPITAL COURSE:   [age] F with hx of HTN, GERD, Severe COPD, diastolic CHF adm 40/9812/15 with respiratory distress after smoking crack cocaine. Initially admitted to ICU/SDU by hospitalist service on BiPAP and subsequently required intubation  *Acute on chronic hypoxic resp failure due to Copd exacerbation -intubated and now extuabted On 4 liter [place] -steroid taper, nebs and inhalers  *Sinus tachycardia, resolved  *Hx: Diastolic CHF and HTN -not in ChF currently  *Hx: GERD   *Poly substance abuse advised to stay away from drugs  *Suspected bacterial vaginosis po flagyl  PT recommends  No PT needs D/c home with oxygen (chronic)  D/w pt and family CONSULTS OBTAINED:    DRUG ALLERGIES:   Allergies  Allergen Reactions  . Symbicort [Budesonide-Formoterol Fumarate] Swelling and Other (See Comments)    Reaction:  Tongue swelling   . Formoterol Fumarate Swelling and Other (See Comments)    Reaction:  Tongue swelling    DISCHARGE MEDICATIONS:   Current Discharge Medication List    START taking these medications   Details  metroNIDAZOLE (FLAGYL) 500 MG tablet  Take 1 tablet (500 mg total) by mouth every 8 (eight) hours. Qty: 15 tablet, Refills: 0      CONTINUE these medications which have CHANGED   Details  predniSONE (DELTASONE) 10 MG tablet Take 4 tablets (40 mg total) by mouth daily with breakfast. Start with 40 mg daily taper  By 10 mg daily then stop Qty: 10 tablet, Refills: 0      CONTINUE these medications which have NOT CHANGED   Details  albuterol (PROVENTIL HFA) 108 (90 Base) MCG/ACT inhaler Inhale 2 puffs into the lungs every 4 (four) hours as needed for wheezing or shortness of breath. Qty: 1 Inhaler, Refills: 0    albuterol (PROVENTIL) (2.5 MG/3ML) 0.083% nebulizer solution Take 3 mLs (2.5 mg total) by nebulization every 6 (six) hours as needed for wheezing or shortness of breath. Qty: 75 mL, Refills: 0    beclomethasone (QVAR) 80 MCG/ACT inhaler Inhale 2 puffs into the lungs 2 (two) times daily.    EPINEPHrine 0.3 mg/0.3 mL IJ SOAJ injection Inject 0.3 mLs (0.3 mg total) into the muscle once. Qty: 1 Device, Refills: 0    fluticasone (FLONASE) 50 MCG/ACT nasal spray Place 1 spray into both nostrils 2 (two) times daily.     Fluticasone-Salmeterol (ADVAIR) 500-50 MCG/DOSE AEPB Inhale 1 puff into the lungs 2 (two) times daily.    ipratropium (ATROVENT) 0.02 % nebulizer solution Take 2.5 mLs (0.5 mg total) by nebulization 4 (four) times daily. Qty: 75 mL, Refills: 12    loratadine (CLARITIN) 10 MG tablet Take 10 mg by mouth daily.  montelukast (SINGULAIR) 10 MG tablet Take 1 tablet (10 mg total) by mouth at bedtime. Qty: 30 tablet, Refills: 10    nicotine (NICODERM CQ - DOSED IN MG/24 HOURS) 21 mg/24hr patch Place 1 patch (21 mg total) onto the skin daily. Qty: 28 patch, Refills: 0    nitroGLYCERIN (NITROSTAT) 0.4 MG SL tablet Place 0.4 mg under the tongue every 5 (five) minutes as needed for chest pain.    roflumilast (DALIRESP) 500 MCG TABS tablet Take 1 tablet (500 mcg total) by mouth daily. Qty: 30 tablet, Refills:  10    tiotropium (SPIRIVA) 18 MCG inhalation capsule Place 18 mcg into inhaler and inhale daily.    ALPRAZolam (XANAX) 0.5 MG tablet Take 1 tablet (0.5 mg total) by mouth 3 (three) times daily as needed for sleep or anxiety. Qty: 30 tablet, Refills: 0    amLODipine (NORVASC) 5 MG tablet Take 1 tablet (5 mg total) by mouth daily. Qty: 30 tablet, Refills: 10    atenolol (TENORMIN) 50 MG tablet Take 1 tablet (50 mg total) by mouth daily. Qty: 30 tablet, Refills: 10    hydrochlorothiazide (HYDRODIURIL) 25 MG tablet Take 1 tablet (25 mg total) by mouth daily. Qty: 30 tablet, Refills: 10      STOP taking these medications     azithromycin (ZITHROMAX) 250 MG tablet         If you experience worsening of your admission symptoms, develop shortness of breath, life threatening emergency, suicidal or homicidal thoughts you must seek medical attention immediately by calling 911 or calling your MD immediately  if symptoms less severe.  You Must read complete instructions/literature along with all the possible adverse reactions/side effects for all the Medicines you take and that have been prescribed to you. Take any new Medicines after you have completely understood and accept all the possible adverse reactions/side effects.   Please note  You were cared for by a hospitalist during your hospital stay. If you have any questions about your discharge medications or the care you received while you were in the hospital after you are discharged, you can call the unit and asked to speak with the hospitalist on call if the hospitalist that took care of you is not available. Once you are discharged, your primary care physician will handle any further medical issues. Please note that NO REFILLS for any discharge medications will be authorized once you are discharged, as it is imperative that you return to your primary care physician (or establish a relationship with a primary care physician if you do not have  one) for your aftercare needs so that they can reassess your need for medications and monitor your lab values. Today   SUBJECTIVE   Doing well.  Can I have some xanax to go home with??  VITAL SIGNS:  Blood pressure (!) 149/79, pulse 75, temperature 97.9 F (36.6 C), temperature source Oral, resp. rate 16, height 5\' 2"  (1.575 m), weight 85.2 kg (187 lb 13.3 oz), SpO2 100 %.  I/O:   Intake/Output Summary (Last 24 hours) at 03/24/16 0839 Last data filed at 03/23/16 1000  Gross per 24 hour  Intake                3 ml  Output                0 ml  Net                3 ml    PHYSICAL EXAMINATION:  GENERAL:  60 y.o.-year-old patient lying in the bed with no acute distress.  EYES: Pupils equal, round, reactive to light and accommodation. No scleral icterus. Extraocular muscles intact.  HEENT: Head atraumatic, normocephalic. Oropharynx and nasopharynx clear.  NECK:  Supple, no jugular venous distention. No thyroid enlargement, no tenderness.  LUNGS: distant breath sounds bilaterally, no wheezing, rales,rhonchi or crepitation. No use of accessory muscles of respiration.  CARDIOVASCULAR: S1, S2 normal. No murmurs, rubs, or gallops.  ABDOMEN: Soft, non-tender, non-distended. Bowel sounds present. No organomegaly or mass.  EXTREMITIES: No pedal edema, cyanosis, or clubbing.  NEUROLOGIC: Cranial nerves II through XII are intact. Muscle strength 5/5 in all extremities. Sensation intact. Gait not checked.  PSYCHIATRIC: The patient is alert and oriented x 3.  SKIN: No obvious rash, lesion, or ulcer.   DATA REVIEW:   CBC   Recent Labs Lab 03/21/16 0440  WBC 9.4  HGB 10.3*  HCT 30.1*  PLT 257    Chemistries   Recent Labs Lab 03/21/16 0440 03/22/16 0617  NA 143 141  K 3.4* 3.9  CL 104 104  CO2 35* 32  GLUCOSE 120* 127*  BUN 22* 24*  CREATININE 0.52 0.53  CALCIUM 9.0 9.0  MG 2.0  --     Microbiology Results   Recent Results (from the past 240 hour(s))  MRSA PCR Screening      Status: None   Collection Time: 03/14/16  1:30 PM  Result Value Ref Range Status   MRSA by PCR NEGATIVE NEGATIVE Final    Comment:        The GeneXpert MRSA Assay (FDA approved for NASAL specimens only), is one component of a comprehensive MRSA colonization surveillance program. It is not intended to diagnose MRSA infection nor to guide or monitor treatment for MRSA infections.   Culture, expectorated sputum-assessment     Status: None   Collection Time: 03/16/16  9:35 AM  Result Value Ref Range Status   Specimen Description TRACHEAL ASPIRATE  Final   Special Requests NONE  Final   Sputum evaluation THIS SPECIMEN IS ACCEPTABLE FOR SPUTUM CULTURE  Final   Report Status 03/16/2016 FINAL  Final  Culture, respiratory (NON-Expectorated)     Status: None   Collection Time: 03/16/16  9:35 AM  Result Value Ref Range Status   Specimen Description TRACHEAL ASPIRATE  Final   Special Requests NONE Reflexed from [id number]  Final   Gram Stain   Final    MODERATE WBC PRESENT,BOTH PMN AND MONONUCLEAR NO ORGANISMS SEEN    Culture   Final    NO GROWTH 2 DAYS [facility]    Report Status 03/18/2016 FINAL  Final    RADIOLOGY:  No results found.   Management plans discussed with the patient, family and they are in agreement.  CODE STATUS:     Code Status Orders        Start     Ordered   03/14/16 0917  Full code  Continuous     03/14/16 0916    Code Status History    Date Active Date Inactive Code Status Order ID Comments User Context   02/16/2016  2:22 PM 02/17/2016  6:40 PM Full Code [id number]  Adrian SaranSital Mody, MD ED   12/08/2015  8:03 AM 12/08/2015  3:38 PM Full Code [id number]  Arnaldo NatalMichael S Diamond, MD Inpatient   08/17/2015  4:46 AM 08/17/2015  4:25 PM Full Code [id number]  Arnaldo NatalMichael S Diamond, MD Inpatient   07/12/2015  9:18  AM 07/13/2015  3:21 PM Full Code [id number]  Merwyn Katos, MD ED   05/30/2015  3:47 PM 05/31/2015  2:18 PM Full Code [id number]  Milagros Loll, MD ED    05/26/2015  8:18 PM 05/27/2015 12:18 PM Full Code [id number]  Alford Highland, MD ED   02/16/2015  4:54 AM 02/17/2015  1:51 PM Full Code [id number]  Arnaldo Natal, MD Inpatient   01/25/2015  7:51 AM 01/25/2015  8:38 PM Full Code [id number]  Oralia Manis, MD Inpatient   01/02/2015 11:42 PM 01/03/2015  6:47 PM Full Code [id number]  Ramonita Lab, MD Inpatient   12/10/2014  9:32 PM 12/11/2014  1:12 PM Full Code [id number]  Auburn Bilberry, MD Inpatient   10/07/2014  3:54 AM 10/09/2014  1:35 PM Full Code [id number]  Oralia Manis, MD Inpatient   09/23/2014  3:18 AM 09/23/2014  1:57 PM Full Code [id number]  Oralia Manis, MD Inpatient   08/11/2014  6:08 PM 08/13/2014  3:53 PM Full Code [id number]  Adrian Saran, MD Inpatient      TOTAL TIME TAKING CARE OF THIS PATIENT: 40 minutes.    Abdoulie Tierce M.D on 03/24/2016 at 8:39 AM  Between 7am to 6pm - Pager - [phone] After 6pm go to [email or web address] - password [email or web address] [facility]  [place] [facility]  Office  [phone]  CC: Primary care physician; [facility]

## 2016-03-25 NOTE — Care Management (Signed)
MD unable to put home health orders in system post discharge. I have faxed Rx to [facility] [phone].

## 2016-04-21 ENCOUNTER — Inpatient Hospital Stay: Payer: Medicaid Other

## 2016-04-21 ENCOUNTER — Inpatient Hospital Stay
Admission: EM | Admit: 2016-04-21 | Discharge: 2016-05-01 | DRG: 207 | Disposition: E | Payer: Medicaid Other | Attending: Internal Medicine | Admitting: Internal Medicine

## 2016-04-21 ENCOUNTER — Emergency Department: Payer: Medicaid Other

## 2016-04-21 DIAGNOSIS — X58XXXA Exposure to other specified factors, initial encounter: Secondary | ICD-10-CM | POA: Diagnosis not present

## 2016-04-21 DIAGNOSIS — J441 Chronic obstructive pulmonary disease with (acute) exacerbation: Secondary | ICD-10-CM | POA: Diagnosis present

## 2016-04-21 DIAGNOSIS — Z4659 Encounter for fitting and adjustment of other gastrointestinal appliance and device: Secondary | ICD-10-CM

## 2016-04-21 DIAGNOSIS — Z66 Do not resuscitate: Secondary | ICD-10-CM | POA: Diagnosis not present

## 2016-04-21 DIAGNOSIS — I110 Hypertensive heart disease with heart failure: Secondary | ICD-10-CM | POA: Diagnosis present

## 2016-04-21 DIAGNOSIS — F17210 Nicotine dependence, cigarettes, uncomplicated: Secondary | ICD-10-CM | POA: Diagnosis present

## 2016-04-21 DIAGNOSIS — K219 Gastro-esophageal reflux disease without esophagitis: Secondary | ICD-10-CM | POA: Diagnosis present

## 2016-04-21 DIAGNOSIS — E872 Acidosis: Secondary | ICD-10-CM | POA: Diagnosis present

## 2016-04-21 DIAGNOSIS — T17990A Other foreign object in respiratory tract, part unspecified in causing asphyxiation, initial encounter: Secondary | ICD-10-CM | POA: Diagnosis not present

## 2016-04-21 DIAGNOSIS — J9622 Acute and chronic respiratory failure with hypercapnia: Principal | ICD-10-CM | POA: Diagnosis present

## 2016-04-21 DIAGNOSIS — N170 Acute kidney failure with tubular necrosis: Secondary | ICD-10-CM | POA: Diagnosis present

## 2016-04-21 DIAGNOSIS — Z888 Allergy status to other drugs, medicaments and biological substances status: Secondary | ICD-10-CM

## 2016-04-21 DIAGNOSIS — J4550 Severe persistent asthma, uncomplicated: Secondary | ICD-10-CM | POA: Diagnosis present

## 2016-04-21 DIAGNOSIS — I4891 Unspecified atrial fibrillation: Secondary | ICD-10-CM | POA: Diagnosis present

## 2016-04-21 DIAGNOSIS — E876 Hypokalemia: Secondary | ICD-10-CM | POA: Diagnosis present

## 2016-04-21 DIAGNOSIS — R001 Bradycardia, unspecified: Secondary | ICD-10-CM | POA: Diagnosis present

## 2016-04-21 DIAGNOSIS — Z9981 Dependence on supplemental oxygen: Secondary | ICD-10-CM | POA: Diagnosis not present

## 2016-04-21 DIAGNOSIS — I5033 Acute on chronic diastolic (congestive) heart failure: Secondary | ICD-10-CM | POA: Diagnosis present

## 2016-04-21 DIAGNOSIS — I5032 Chronic diastolic (congestive) heart failure: Secondary | ICD-10-CM | POA: Diagnosis not present

## 2016-04-21 DIAGNOSIS — R258 Other abnormal involuntary movements: Secondary | ICD-10-CM | POA: Diagnosis not present

## 2016-04-21 DIAGNOSIS — R739 Hyperglycemia, unspecified: Secondary | ICD-10-CM | POA: Diagnosis present

## 2016-04-21 DIAGNOSIS — R0902 Hypoxemia: Secondary | ICD-10-CM | POA: Diagnosis not present

## 2016-04-21 DIAGNOSIS — R000 Tachycardia, unspecified: Secondary | ICD-10-CM | POA: Diagnosis not present

## 2016-04-21 DIAGNOSIS — Z515 Encounter for palliative care: Secondary | ICD-10-CM | POA: Diagnosis not present

## 2016-04-21 DIAGNOSIS — R4182 Altered mental status, unspecified: Secondary | ICD-10-CM | POA: Diagnosis not present

## 2016-04-21 DIAGNOSIS — D72829 Elevated white blood cell count, unspecified: Secondary | ICD-10-CM | POA: Diagnosis present

## 2016-04-21 DIAGNOSIS — R402432 Glasgow coma scale score 3-8, at arrival to emergency department: Secondary | ICD-10-CM | POA: Diagnosis present

## 2016-04-21 DIAGNOSIS — F329 Major depressive disorder, single episode, unspecified: Secondary | ICD-10-CM | POA: Diagnosis present

## 2016-04-21 DIAGNOSIS — G931 Anoxic brain damage, not elsewhere classified: Secondary | ICD-10-CM | POA: Diagnosis not present

## 2016-04-21 DIAGNOSIS — N179 Acute kidney failure, unspecified: Secondary | ICD-10-CM | POA: Diagnosis not present

## 2016-04-21 DIAGNOSIS — I469 Cardiac arrest, cause unspecified: Secondary | ICD-10-CM | POA: Diagnosis not present

## 2016-04-21 DIAGNOSIS — F410 Panic disorder [episodic paroxysmal anxiety] without agoraphobia: Secondary | ICD-10-CM | POA: Diagnosis present

## 2016-04-21 DIAGNOSIS — Z978 Presence of other specified devices: Secondary | ICD-10-CM

## 2016-04-21 DIAGNOSIS — I959 Hypotension, unspecified: Secondary | ICD-10-CM | POA: Diagnosis present

## 2016-04-21 DIAGNOSIS — Z825 Family history of asthma and other chronic lower respiratory diseases: Secondary | ICD-10-CM

## 2016-04-21 DIAGNOSIS — R092 Respiratory arrest: Secondary | ICD-10-CM | POA: Diagnosis present

## 2016-04-21 DIAGNOSIS — Z6833 Body mass index (BMI) 33.0-33.9, adult: Secondary | ICD-10-CM

## 2016-04-21 DIAGNOSIS — J9690 Respiratory failure, unspecified, unspecified whether with hypoxia or hypercapnia: Secondary | ICD-10-CM

## 2016-04-21 DIAGNOSIS — Z8249 Family history of ischemic heart disease and other diseases of the circulatory system: Secondary | ICD-10-CM

## 2016-04-21 DIAGNOSIS — Z79899 Other long term (current) drug therapy: Secondary | ICD-10-CM

## 2016-04-21 LAB — CBC WITH DIFFERENTIAL/PLATELET
BAND NEUTROPHILS: 0 %
BASOS ABS: 0 10*3/uL (ref 0–0.1)
BASOS PCT: 0 %
Blasts: 0 %
EOS PCT: 4 %
Eosinophils Absolute: 0.5 10*3/uL (ref 0–0.7)
HCT: 37.2 % (ref 35.0–47.0)
Hemoglobin: 12.1 g/dL (ref 12.0–16.0)
Lymphocytes Relative: 32 %
Lymphs Abs: 3.6 10*3/uL (ref 1.0–3.6)
MCH: 33 pg (ref 26.0–34.0)
MCHC: 32.4 g/dL (ref 32.0–36.0)
MCV: 101.8 fL — AB (ref 80.0–100.0)
METAMYELOCYTES PCT: 1 %
MONO ABS: 0.5 10*3/uL (ref 0.2–0.9)
MONOS PCT: 4 %
MYELOCYTES: 2 %
NEUTROS ABS: 6.7 10*3/uL — AB (ref 1.4–6.5)
Neutrophils Relative %: 57 %
Other: 0 %
PLATELETS: 279 10*3/uL (ref 150–440)
Promyelocytes Absolute: 0 %
RBC: 3.66 MIL/uL — ABNORMAL LOW (ref 3.80–5.20)
RDW: 14 % (ref 11.5–14.5)
WBC: 11.3 10*3/uL — ABNORMAL HIGH (ref 3.6–11.0)
nRBC: 0 /100 WBC

## 2016-04-21 LAB — BLOOD GAS, ARTERIAL
ACID-BASE DEFICIT: 5.8 mmol/L — AB (ref 0.0–2.0)
Acid-base deficit: 2.7 mmol/L — ABNORMAL HIGH (ref 0.0–2.0)
BICARBONATE: 28.1 mmol/L — AB (ref 20.0–28.0)
Bicarbonate: 27.5 mmol/L (ref 20.0–28.0)
FIO2: 1
FIO2: 1
MECHVT: 450 mL
MECHVT: 500 mL
O2 SAT: 99.9 %
O2 SAT: 99.8 %
PATIENT TEMPERATURE: 37
PCO2 ART: 79 mmHg — AB (ref 32.0–48.0)
PEEP/CPAP: 5 cmH2O
PEEP: 5 cmH2O
PH ART: 7 — AB (ref 7.350–7.450)
PO2 ART: 274 mmHg — AB (ref 83.0–108.0)
Patient temperature: 37
RATE: 16 resp/min
RATE: 25 resp/min
pCO2 arterial: 114 mmHg (ref 32.0–48.0)
pH, Arterial: 7.15 — CL (ref 7.350–7.450)
pO2, Arterial: 425 mmHg — ABNORMAL HIGH (ref 83.0–108.0)

## 2016-04-21 LAB — COMPREHENSIVE METABOLIC PANEL
ALBUMIN: 3.9 g/dL (ref 3.5–5.0)
ALT: 70 U/L — ABNORMAL HIGH (ref 14–54)
AST: 106 U/L — AB (ref 15–41)
Alkaline Phosphatase: 74 U/L (ref 38–126)
Anion gap: 10 (ref 5–15)
BUN: 14 mg/dL (ref 6–20)
CHLORIDE: 104 mmol/L (ref 101–111)
CO2: 28 mmol/L (ref 22–32)
Calcium: 8.8 mg/dL — ABNORMAL LOW (ref 8.9–10.3)
Creatinine, Ser: 1.31 mg/dL — ABNORMAL HIGH (ref 0.44–1.00)
GFR calc Af Amer: 51 mL/min — ABNORMAL LOW (ref >60)
GFR, EST NON AFRICAN AMERICAN: 44 mL/min — AB (ref >60)
Glucose, Bld: 218 mg/dL — ABNORMAL HIGH (ref 65–99)
POTASSIUM: 5 mmol/L (ref 3.5–5.1)
SODIUM: 142 mmol/L (ref 135–145)
Total Bilirubin: 0.6 mg/dL (ref 0.3–1.2)
Total Protein: 6.4 g/dL — ABNORMAL LOW (ref 6.5–8.1)

## 2016-04-21 LAB — BRAIN NATRIURETIC PEPTIDE: B Natriuretic Peptide: 172 pg/mL — ABNORMAL HIGH (ref 0.0–100.0)

## 2016-04-21 LAB — LACTIC ACID, PLASMA: LACTIC ACID, VENOUS: 4.3 mmol/L — AB (ref 0.5–1.9)

## 2016-04-21 LAB — TROPONIN I: TROPONIN I: 0.04 ng/mL — AB (ref <0.03)

## 2016-04-21 MED ORDER — SODIUM CHLORIDE 0.9 % IV SOLN
100.0000 ug/h | INTRAVENOUS | Status: DC
  Filled 2016-04-21: qty 50

## 2016-04-21 MED ORDER — ARTIFICIAL TEARS OP OINT
1.0000 "application " | TOPICAL_OINTMENT | Freq: Three times a day (TID) | OPHTHALMIC | Status: DC
  Administered 2016-04-22 – 2016-04-28 (×20): 1 via OPHTHALMIC
  Filled 2016-04-21: qty 3.5

## 2016-04-21 MED ORDER — SODIUM CHLORIDE 0.9 % IV SOLN
1.0000 ug/kg/min | INTRAVENOUS | Status: DC
  Administered 2016-04-22: 1 ug/kg/min via INTRAVENOUS
  Filled 2016-04-21 (×2): qty 20

## 2016-04-21 MED ORDER — HEPARIN SODIUM (PORCINE) 5000 UNIT/ML IJ SOLN
5000.0000 [IU] | Freq: Three times a day (TID) | INTRAMUSCULAR | Status: DC
  Administered 2016-04-22 – 2016-04-30 (×26): 5000 [IU] via SUBCUTANEOUS
  Filled 2016-04-21 (×26): qty 1

## 2016-04-21 MED ORDER — IPRATROPIUM-ALBUTEROL 0.5-2.5 (3) MG/3ML IN SOLN
3.0000 mL | RESPIRATORY_TRACT | Status: DC
  Administered 2016-04-22 – 2016-04-28 (×38): 3 mL via RESPIRATORY_TRACT
  Filled 2016-04-21 (×43): qty 3

## 2016-04-21 MED ORDER — IPRATROPIUM-ALBUTEROL 0.5-2.5 (3) MG/3ML IN SOLN
RESPIRATORY_TRACT | Status: AC
  Administered 2016-04-21: 3 mL via RESPIRATORY_TRACT
  Filled 2016-04-21: qty 3

## 2016-04-21 MED ORDER — PROPOFOL 1000 MG/100ML IV EMUL
INTRAVENOUS | Status: AC
  Filled 2016-04-21: qty 100

## 2016-04-21 MED ORDER — MIDAZOLAM HCL 2 MG/2ML IJ SOLN
2.0000 mg | Freq: Once | INTRAMUSCULAR | Status: AC
  Administered 2016-04-22: 2 mg via INTRAVENOUS
  Filled 2016-04-21 (×2): qty 2

## 2016-04-21 MED ORDER — ASPIRIN 300 MG RE SUPP
300.0000 mg | RECTAL | Status: AC
  Administered 2016-04-22: 300 mg via RECTAL
  Filled 2016-04-21: qty 1

## 2016-04-21 MED ORDER — IPRATROPIUM-ALBUTEROL 0.5-2.5 (3) MG/3ML IN SOLN
3.0000 mL | Freq: Four times a day (QID) | RESPIRATORY_TRACT | Status: DC | PRN
  Administered 2016-04-24 – 2016-04-27 (×3): 3 mL via RESPIRATORY_TRACT

## 2016-04-21 MED ORDER — CISATRACURIUM BOLUS VIA INFUSION
0.1000 mg/kg | Freq: Once | INTRAVENOUS | Status: DC
  Filled 2016-04-21: qty 9

## 2016-04-21 MED ORDER — FENTANYL CITRATE (PF) 100 MCG/2ML IJ SOLN: 100.0000 ug | Freq: Once | INTRAMUSCULAR | Status: DC

## 2016-04-21 MED ORDER — SODIUM BICARBONATE 8.4 % IV SOLN
INTRAVENOUS | Status: AC
  Filled 2016-04-21: qty 50

## 2016-04-21 MED ORDER — FENTANYL CITRATE (PF) 100 MCG/2ML IJ SOLN
100.0000 ug | Freq: Once | INTRAMUSCULAR | Status: AC
Start: 2016-04-21 — End: 2016-04-21
  Administered 2016-04-21: 100 ug via INTRAVENOUS
  Filled 2016-04-21: qty 2

## 2016-04-21 MED ORDER — EPINEPHRINE PF 1 MG/10ML IJ SOSY
PREFILLED_SYRINGE | INTRAMUSCULAR | Status: AC | PRN
  Administered 2016-04-21: 1 mg via INTRAVENOUS

## 2016-04-21 MED ORDER — IPRATROPIUM-ALBUTEROL 0.5-2.5 (3) MG/3ML IN SOLN
3.0000 mL | Freq: Once | RESPIRATORY_TRACT | Status: AC
Start: 2016-04-21 — End: 2016-04-21
  Administered 2016-04-21: 3 mL via RESPIRATORY_TRACT

## 2016-04-21 MED ORDER — METHYLPREDNISOLONE SODIUM SUCC 125 MG IJ SOLR
125.0000 mg | Freq: Once | INTRAMUSCULAR | Status: AC
  Administered 2016-04-21: 125 mg via INTRAVENOUS
  Filled 2016-04-21: qty 2

## 2016-04-21 MED ORDER — CHLORHEXIDINE GLUCONATE 0.12% ORAL RINSE (MEDLINE KIT)
15.0000 mL | Freq: Two times a day (BID) | OROMUCOSAL | Status: DC
  Administered 2016-04-22 – 2016-04-30 (×18): 15 mL via OROMUCOSAL

## 2016-04-21 MED ORDER — CISATRACURIUM BOLUS VIA INFUSION
0.0500 mg/kg | INTRAVENOUS | Status: DC | PRN
  Filled 2016-04-21: qty 5

## 2016-04-21 MED ORDER — SODIUM CHLORIDE 0.9 % IV SOLN: INTRAVENOUS | Status: DC

## 2016-04-21 MED ORDER — SODIUM BICARBONATE 8.4 % IV SOLN
INTRAVENOUS | Status: AC | PRN
  Administered 2016-04-21: 50 meq via INTRAVENOUS

## 2016-04-21 MED ORDER — MIDAZOLAM HCL 5 MG/5ML IJ SOLN
INTRAMUSCULAR | Status: AC
  Filled 2016-04-21: qty 5

## 2016-04-21 MED ORDER — SODIUM CHLORIDE 0.9 % IV SOLN
INTRAVENOUS | Status: AC | PRN
  Administered 2016-04-21: 999 mL/h via INTRAVENOUS

## 2016-04-21 MED ORDER — MAGNESIUM SULFATE 2 GM/50ML IV SOLN
INTRAVENOUS | Status: AC
  Filled 2016-04-21: qty 50

## 2016-04-21 MED ORDER — EPINEPHRINE PF 1 MG/10ML IJ SOSY
PREFILLED_SYRINGE | INTRAMUSCULAR | Status: AC
  Administered 2016-04-21: 1 mg via INTRAVENOUS
  Filled 2016-04-21: qty 10

## 2016-04-21 MED ORDER — NOREPINEPHRINE 4 MG/250ML-% IV SOLN
0.0000 ug/min | INTRAVENOUS | Status: DC
  Administered 2016-04-21: 5 ug/min via INTRAVENOUS
  Administered 2016-04-22: 16 ug/min via INTRAVENOUS
  Filled 2016-04-21 (×3): qty 250

## 2016-04-21 MED ORDER — MIDAZOLAM HCL 2 MG/2ML IJ SOLN: 2.0000 mg | Freq: Once | INTRAMUSCULAR | Status: DC

## 2016-04-21 MED ORDER — ORAL CARE MOUTH RINSE
15.0000 mL | OROMUCOSAL | Status: DC
  Administered 2016-04-22 – 2016-04-30 (×78): 15 mL via OROMUCOSAL

## 2016-04-21 MED ORDER — NOREPINEPHRINE BITARTRATE 1 MG/ML IV SOLN: 0.0000 ug/min | INTRAVENOUS | Status: DC

## 2016-04-21 MED ORDER — MIDAZOLAM HCL 5 MG/5ML IJ SOLN
5.0000 mg | Freq: Once | INTRAMUSCULAR | Status: AC
  Administered 2016-04-21: 5 mg via INTRAVENOUS

## 2016-04-21 MED ORDER — MAGNESIUM SULFATE 50 % IJ SOLN
INTRAMUSCULAR | Status: AC | PRN
  Administered 2016-04-21: 2 g via INTRAVENOUS

## 2016-04-21 MED ORDER — EPINEPHRINE PF 1 MG/10ML IJ SOSY
PREFILLED_SYRINGE | INTRAMUSCULAR | Status: AC
  Filled 2016-04-21: qty 10

## 2016-04-21 MED ORDER — METHYLPREDNISOLONE SODIUM SUCC 40 MG IJ SOLR
40.0000 mg | Freq: Three times a day (TID) | INTRAMUSCULAR | Status: DC
  Administered 2016-04-22 – 2016-04-28 (×19): 40 mg via INTRAVENOUS
  Filled 2016-04-21 (×20): qty 1

## 2016-04-21 MED ORDER — EPINEPHRINE PF 1 MG/10ML IJ SOSY
1.0000 mg | PREFILLED_SYRINGE | Freq: Once | INTRAMUSCULAR | Status: AC
  Administered 2016-04-21: 1 mg via INTRAVENOUS

## 2016-04-21 MED ORDER — SODIUM BICARBONATE 8.4 % IV SOLN
INTRAVENOUS | Status: AC | PRN
Start: 2016-04-21 — End: 2016-04-21
  Administered 2016-04-21: 50 meq via INTRAVENOUS

## 2016-04-21 MED ORDER — PROPOFOL 1000 MG/100ML IV EMUL
5.0000 ug/kg/min | Freq: Once | INTRAVENOUS | Status: DC
  Administered 2016-04-21: 5 ug/kg/min via INTRAVENOUS

## 2016-04-21 MED ORDER — SODIUM CHLORIDE 0.9 % IV SOLN
INTRAVENOUS | Status: AC | PRN
  Administered 2016-04-21 (×3): 1000 mL via INTRAVENOUS

## 2016-04-21 MED ORDER — MIDAZOLAM BOLUS VIA INFUSION
2.0000 mg | INTRAVENOUS | Status: DC | PRN
  Administered 2016-04-23 – 2016-04-27 (×7): 2 mg via INTRAVENOUS
  Filled 2016-04-21: qty 2

## 2016-04-21 MED ORDER — FENTANYL BOLUS VIA INFUSION
50.0000 ug | INTRAVENOUS | Status: DC | PRN
  Administered 2016-04-24 – 2016-04-27 (×3): 50 ug via INTRAVENOUS
  Administered 2016-04-27: 100 ug via INTRAVENOUS
  Filled 2016-04-21: qty 50

## 2016-04-21 MED ORDER — SODIUM CHLORIDE 0.9 % IV SOLN
2.0000 mg/h | INTRAVENOUS | Status: DC
  Administered 2016-04-22: 6 mg/h via INTRAVENOUS
  Administered 2016-04-22: 4 mg/h via INTRAVENOUS
  Administered 2016-04-22: 2 mg/h via INTRAVENOUS
  Administered 2016-04-24: 5 mg/h via INTRAVENOUS
  Administered 2016-04-24 (×2): 4 mg/h via INTRAVENOUS
  Administered 2016-04-25 – 2016-04-27 (×4): 3 mg/h via INTRAVENOUS
  Filled 2016-04-21 (×12): qty 10

## 2016-04-21 MED ORDER — DEXTROSE 5 % IV SOLN
INTRAVENOUS | Status: DC
  Administered 2016-04-22: 02:00:00 via INTRAVENOUS
  Filled 2016-04-21 (×3): qty 150

## 2016-04-21 MED ORDER — FAMOTIDINE IN NACL 20-0.9 MG/50ML-% IV SOLN
20.0000 mg | Freq: Two times a day (BID) | INTRAVENOUS | Status: DC
  Administered 2016-04-22 – 2016-04-25 (×8): 20 mg via INTRAVENOUS
  Filled 2016-04-21 (×8): qty 50

## 2016-04-21 NOTE — ED Triage Notes (Signed)
[facility].  Pt called out for breathing difficulty.  On scene pt respiratory arrested.  Per EMS, she was in PEA to asystole before pulses regained.  Per EMS 3 rounds of epi given and size 7 ET tube placed at 25 at the teeth.  Pt ET per EMS was 99.  Daughter at bedside and states that she saw pt earlier today and that pt was breathing fine and moving around normally.

## 2016-04-21 NOTE — Progress Notes (Signed)
   Call from ER doc  -  known copd. AECOPD in hospital mid nov, mid dec 2017  intubated in fiel[place] Witnessed arrest by EMS - was seen in tripod postion (well known to EMS ) - PEA arrest and brief astyole - 12 min cpr - ROSC with normal BP and snus/ Not needing pressors - currently on diprivan - not following commands initially but then started coming around   [facility] to bedside from ICU team  Dr. Kalman ShanMurali Emanuela Runnion, M.D., [facility] Pulmonary and Critical Care Medicine Staff Physician [facility] [place] [facility] Pager: [phone], If no answer or between  15:00h - 7:00h: call [phone]  Jan 30, 2017 9:58 PM

## 2016-04-21 NOTE — ED Notes (Signed)
Amp of sodium bicarb given and pt shocked at 200 joules.

## 2016-04-21 NOTE — ED Provider Notes (Signed)
Time Seen: Approximately 2054  I have reviewed the triage notes  Chief Complaint: Cardiac Arrest   History of Present Illness: Julie Foley is a 61 y.o. female who is known to EMS providers and has frequent severe episodes of COPD. Patient has been intubated here recently in the past. EMS was notified due to respiratory distress and found the patient in respiratory distress and then went apneic and transported here to the emergency department. She was intubated in the field. The patient did not have any descriptions of symptoms other than respiratory distress to the EMS providers. Patient went into pulseless electrical activity and then asystole and had chest compressions for approximately 10 minutes with return of pulse. There is now currently breathing over the ventilator.  Past Medical History:  Diagnosis Date  . Anxiety   . Asthma   . CHF (congestive heart failure) (HCC)   . Chronic respiratory failure (HCC)   . COPD (chronic obstructive pulmonary disease) (HCC)   . Depression   . GERD (gastroesophageal reflux disease)   . HTN (hypertension)     Patient Active Problem List   Diagnosis Date Noted  . Respiratory distress 12/08/2015  . Acute on chronic respiratory failure with hypoxemia (HCC) 08/17/2015  . Acute respiratory failure (HCC) 05/26/2015  . Sepsis (HCC) 02/16/2015  . COPD with acute exacerbation (HCC) 01/25/2015  . Acute respiratory failure with hypoxemia (HCC) 10/07/2014  . Anxiety 10/07/2014  . HTN (hypertension) 09/23/2014  . GERD (gastroesophageal reflux disease) 09/23/2014  . CHF (congestive heart failure) (HCC) 09/23/2014  . COPD exacerbation (HCC) 08/11/2014    Past Surgical History:  Procedure Laterality Date  . TUBAL LIGATION Bilateral     Past Surgical History:  Procedure Laterality Date  . TUBAL LIGATION Bilateral     Current Outpatient Rx  . Order #: [id number] Class: Print  . Order #: [id number] Class: Print  . Order #: [id number] Class: Print   . Order #: [id number] Class: Normal  . Order #: [id number] Class: Normal  . Order #: [id number] Class: Historical Med  . Order #: [id number] Class: Print  . Order #: [id number] Class: Historical Med  . Order #: [id number] Class: Historical Med  . Order #: [id number] Class: Normal  . Order #: [id number] Class: Normal  . Order #: [id number] Class: Historical Med  . Order #: [id number] Class: Normal  . Order #: [id number] Class: Print  . Order #: [id number] Class: Historical Med  . Order #: [id number] Class: Normal  . Order #: [id number] Class: Normal  . Order #: [id number] Class: Historical Med    Allergies:  Symbicort [budesonide-formoterol fumarate] and Formoterol fumarate  Family History: Family History  Problem Relation Age of Onset  . Cancer Mother   . Hypertension Mother   . Diabetes Mother   . Asthma Son     Social History: Social History  Substance Use Topics  . Smoking status: Current Every Day Smoker    Packs/day: 2.00    Years: 35.00    Types: Cigarettes  . Smokeless tobacco: Never Used  . Alcohol use 0.0 oz/week     Comment: ocassional beer drinker     Review of Systems:   10 point review of systems was performed and was otherwise negative: Review of systems difficult to obtain secondary to patient's intubation and obtunded Constitutional: No fever Eyes: No visual disturbances ENT: No sore throat, ear pain Cardiac: No chest pain Respiratory: No shortness of breath, wheezing, or stridor Abdomen: No abdominal pain, no vomiting, No diarrhea Endocrine: No weight loss, No night sweats Extremities: No peripheral edema, cyanosis Skin: No  rashes, easy bruising Neurologic: No focal weakness, trouble with speech or swollowing Urologic: No dysuria, Hematuria, or urinary frequency   Physical Exam:  ED Triage Vitals  Enc Vitals Group     BP Aug 18, 2016 2102 (!) 163/132     Pulse Rate Aug 18, 2016 2102 (!) 126     Resp Aug 18, 2016 2102 14     Temp Aug 18, 2016 2102 (!) 96.7 F (35.9 C)     Temp  src --      SpO2 Aug 18, 2016 2050 100 %     Weight Aug 18, 2016 2119 187 lb 13.3 oz (85.2 kg)     Height Aug 18, 2016 2119 5\' 2"  (1.575 m)     Head Circumference --      Peak Flow --      Pain Score --      Pain Loc --      Pain Edu? --      Excl. in GC? --     General: Patient's intubated Glascow coma scale 3 Head: Normal cephalic , atraumatic Eyes: Pupils equal , round, reactive to light reactive 5-4 mm bilaterally Nose/Throat: No nasal drainage, patent upper airway without erythema or exudate.  Neck: Supple, Full range of motion, No anterior adenopathy or palpable thyroid masses Lungs: Diminished breath sounds bilaterally at the bases with some end expiratory wheezing heard at the apices Heart: Tachycardia, regular rhythm without murmurs , gallops , or rubs Abdomen: Obese Soft, non tender without rebound, guarding , or rigidity; bowel sounds positive and symmetric in all 4 quadrants. No organomegaly .        Extremities: 2 plus symmetric pulses. No edema, clubbing or cyanosis Neurologic: No spontaneous movement of either upper or lower extremities at this time  Skin: warm, dry, no rashes   Labs:   All laboratory work was reviewed including any pertinent negatives or positives listed below:  Labs Reviewed  CBC WITH DIFFERENTIAL/PLATELET - Abnormal; Notable for the following:       Result Value   WBC 11.3 (*)    RBC 3.66 (*)    MCV 101.8 (*)    Neutro Abs 6.7 (*)    All other components within normal limits  BRAIN NATRIURETIC PEPTIDE - Abnormal; Notable for the following:    B Natriuretic Peptide 172.0 (*)    All other components within normal limits  BLOOD GAS, ARTERIAL - Abnormal; Notable for the following:    pH, Arterial 7.00 (*)    pCO2 arterial 114 (*)    pO2, Arterial 425 (*)    Bicarbonate 28.1 (*)    Acid-base deficit 5.8 (*)    All other components within normal limits  LACTIC ACID, PLASMA - Abnormal; Notable for the following:    Lactic Acid, Venous 4.3 (*)    All  other components within normal limits  COMPREHENSIVE METABOLIC PANEL  TROPONIN I  LACTIC ACID, PLASMA  BLOOD GAS, ARTERIAL  Blood gas showed a pH of 7.00  EKG:  ED ECG REPORT I, Jennye MoccasinBrian S Quigley, the attending physician, personally viewed and interpreted this ECG.  Date: 09/15/16 EKG Time: *2056Rate: 125 Rhythm: Sinus tachycardia QRS Axis: normal Intervals: Prolonged QT interval ST/T Wave abnormalities: normal Conduction Disturbances: none Narrative Interpretation: unremarkable No acute ischemic changes Poor quality EKG  Radiology:  "Dg Chest Port 1 View  Result Date: 20-Nov-2016 CLINICAL DATA:  Acute onset of difficulty breathing. Initial encounter. EXAM: PORTABLE CHEST 1 VIEW COMPARISON:  Chest radiograph performed earlier today at 8:20 p.m. FINDINGS: The patient's endotracheal tube is seen  ending 2-3 cm above the carina. The lungs are well-aerated and clear. There is no evidence of focal opacification, pleural effusion or pneumothorax. The cardiomediastinal silhouette is within normal limits. No acute osseous abnormalities are seen. External pacing pads are noted. IMPRESSION: 1. Endotracheal tube seen ending 2-3 cm above the carina. 2. No acute cardiopulmonary process seen. Electronically Signed   By: Roanna Raider M.D.   On: 04/13/2016 22:49   Dg Chest Portable 1 View  Result Date: 04/25/2016 CLINICAL DATA:  Cardiac arrest, status post intubation EXAM: PORTABLE CHEST 1 VIEW COMPARISON:  03/22/2016 FINDINGS: Endotracheal tube is noted at the level of the aortic knob in satisfactory position. Cardiac shadow is within normal limits. The lungs are well aerated bilaterally. No acute bony abnormality is seen. IMPRESSION: Status post intubation in satisfactory position. No acute abnormality is noted. Electronically Signed   By: Alcide Clever M.D.   On: 04/20/2016 20:42  " In summary chest x-ray shows good tube placement with normal lung fields I personally reviewed the radiologic  studies  Critical Care: * CRITICAL CARE Performed by: Jennye Moccasin   Total critical care time: 75 minutes  Critical care time was exclusive of separately billable procedures and treating other patients.  Critical care was necessary to treat or prevent imminent or life-threatening deterioration.  Critical care was time spent personally by me on the following activities: development of treatment plan with patient and/or surrogate as well as nursing, discussions with consultants, evaluation of patient's response to treatment, examination of patient, obtaining history from patient or surrogate, ordering and performing treatments and interventions, ordering and review of laboratory studies, ordering and review of radiographic studies, pulse oximetry and re-evaluation of patient's condition. *Resuscitation    ED Course: * The patient's coded a total of 2 more times here in emergency department. Patient had approximately 5 minutes of chest compressions with the patient going into first bradycardia then pulseless electrical activity. Patient was started on propofol drip for sedation. The propofol was stopped and the patient became hypotensive. The patient was then given some periodic doses of Versed while she was continued on the ventilator. Arterial blood gases were obtained along with laboratory work and EKG etc. Patient's second blood gas shows some improvement especially in her CAT which initially was 114 and now is decreased to 79. The arrhythmias that she had may be due to multiple sources especially with her being so acidotic.. The critical care team was consulted. She's also received a central line from the critical care team. Patient's was given an amp of bicarbonate and multiple doses of epinephrine with the initial code which was managed by myself here in emergency department. Patient was started on magnesium along with Solu-Medrol and had a DuoNeb catheter history the endotracheal  tube.  Patient had a second code was managed by the critical care team. Again a brief code. Family is aware and I discussed it with the daughter along with the critical care team speaking to her daughter of the nature of the patient's presentation and extremely poor prognosis.     Assessment:  Respiratory arrest   Final Clinical Impression: *  Final diagnoses:  Respiratory arrest [facility])     Plan: * Transported to the intensive care unit for further resuscitation            Jennye Moccasin, MD 04/18/2016 2326

## 2016-04-21 NOTE — H&P (Signed)
PULMONARY / CRITICAL CARE MEDICINE   Name: Julie Foley MRN: [id number] DOB: February 03, 1956    ADMISSION DATE:  04/28/2016 CONSULTATION DATE:  04/27/2016  REFERRING MD:  Dr. Huel Cote   CHIEF COMPLAINT:  Cardiac Arrest   HISTORY OF PRESENT ILLNESS:   This is a 61 yo female with a PMH of HTN, GERD, Depression, Severe COPD (Gold stage IV, FEV1 34% 06/2009), Severe persistent asthma, Chronic home O2 at 4L, Diastolic CHF, Anxiety with panic attacks, and Polysubstance abuse.  She presented to [facility] ER 01/22 post PEA arrest.  Per ER notes the pt notified EMS due to c/o severe shortness of breath. According to pts daughter she was breathing ok the morning of 01/22.  Upon EMS arrival the pt was found to be in respiratory distress then went apneic became pulseless and then asystole ACLS protocol initiated with ROSC 10 minutes following CPR.  She was intubated in the field and transported to the ER.  Upon arrival to the ER she was breathing over the ventilator but not following commands, however she became bradycardic then went into PEA arrest ACLS protocol initiated she was administered 2 mg iv magnesium, 1 amp of bicarb, multiple doses of epi, and 1L fluid bolus with ROSC after 5 mins of CPR.  She coded again in the ER became hypotensive, bradycardic then went into a PEA arrest 1 amp epi, 1 amp sodium bicarb and chest compressions initiated with ROSC 2 minutes after interventions.  PCCM contacted to admit pt to ICU due PEA arrest secondary to acute on chronic hypercapnic respiratory failure secondary to AECOPD requiring mechanical intubation with initiation of hypothermic protocol.  PAST MEDICAL HISTORY :  She  has a past medical history of Anxiety; Asthma; CHF (congestive heart failure) (HCC); Chronic respiratory failure (HCC); COPD (chronic obstructive pulmonary disease) (HCC); Depression; GERD (gastroesophageal reflux disease); and HTN (hypertension).  PAST SURGICAL HISTORY: She  has a past surgical history  that includes Tubal ligation (Bilateral).  Allergies  Allergen Reactions  . Symbicort [Budesonide-Formoterol Fumarate] Swelling and Other (See Comments)    Reaction:  Tongue swelling   . Formoterol Fumarate Swelling and Other (See Comments)    Reaction:  Tongue swelling    No current facility-administered medications on file prior to encounter.    Current Outpatient Prescriptions on File Prior to Encounter  Medication Sig  . albuterol (PROVENTIL HFA) 108 (90 Base) MCG/ACT inhaler Inhale 2 puffs into the lungs every 4 (four) hours as needed for wheezing or shortness of breath.  Marland Kitchen albuterol (PROVENTIL) (2.5 MG/3ML) 0.083% nebulizer solution Take 3 mLs (2.5 mg total) by nebulization every 6 (six) hours as needed for wheezing or shortness of breath.  . ALPRAZolam (XANAX) 0.5 MG tablet Take 1 tablet (0.5 mg total) by mouth 3 (three) times daily as needed for sleep or anxiety. (Patient not taking: Reported on 03/20/2016)  . amLODipine (NORVASC) 5 MG tablet Take 1 tablet (5 mg total) by mouth daily. (Patient not taking: Reported on 03/20/2016)  . atenolol (TENORMIN) 50 MG tablet Take 1 tablet (50 mg total) by mouth daily. (Patient not taking: Reported on 03/20/2016)  . beclomethasone (QVAR) 80 MCG/ACT inhaler Inhale 2 puffs into the lungs 2 (two) times daily.  Marland Kitchen EPINEPHrine 0.3 mg/0.3 mL IJ SOAJ injection Inject 0.3 mLs (0.3 mg total) into the muscle once.  . fluticasone (FLONASE) 50 MCG/ACT nasal spray Place 1 spray into both nostrils 2 (two) times daily.   . Fluticasone-Salmeterol (ADVAIR) 500-50 MCG/DOSE AEPB Inhale 1 puff into  the lungs 2 (two) times daily.  . hydrochlorothiazide (HYDRODIURIL) 25 MG tablet Take 1 tablet (25 mg total) by mouth daily. (Patient not taking: Reported on 03/20/2016)  . ipratropium (ATROVENT) 0.02 % nebulizer solution Take 2.5 mLs (0.5 mg total) by nebulization 4 (four) times daily.  Marland Kitchen. loratadine (CLARITIN) 10 MG tablet Take 10 mg by mouth daily.  . montelukast  (SINGULAIR) 10 MG tablet Take 1 tablet (10 mg total) by mouth at bedtime.  . nicotine (NICODERM CQ - DOSED IN MG/24 HOURS) 21 mg/24hr patch Place 1 patch (21 mg total) onto the skin daily.  . nitroGLYCERIN (NITROSTAT) 0.4 MG SL tablet Place 0.4 mg under the tongue every 5 (five) minutes as needed for chest pain.  . predniSONE (DELTASONE) 10 MG tablet Take 4 tablets (40 mg total) by mouth daily with breakfast. Start with 40 mg daily taper  By 10 mg daily then stop  . roflumilast (DALIRESP) 500 MCG TABS tablet Take 1 tablet (500 mcg total) by mouth daily.  Marland Kitchen. tiotropium (SPIRIVA) 18 MCG inhalation capsule Place 18 mcg into inhaler and inhale daily.    FAMILY HISTORY:  Her indicated that the status of her mother is unknown. She indicated that her father is alive. She indicated that the status of her son is unknown.    SOCIAL HISTORY: She  reports that she has been smoking Cigarettes.  She has a 70.00 pack-year smoking history. She has never used smokeless tobacco. She reports that she drinks alcohol. She reports that she does not use drugs.  REVIEW OF SYSTEMS:   Unable to assess pt intubated   SUBJECTIVE:  Unable to assess pt intubated   VITAL SIGNS: BP 115/86   Pulse (!) 129   Temp 97 F (36.1 C)   Resp 14   Ht 5\' 2"  (1.575 m)   Wt 85.2 kg (187 lb 13.3 oz)   SpO2 100%   BMI 34.35 kg/m   HEMODYNAMICS:    VENTILATOR SETTINGS: Vent Mode: AC FiO2 (%):  [50 %-100 %] 50 % Set Rate:  [16 bmp-25 bmp] 25 bmp Vt Set:  [450 mL-500 mL] 500 mL PEEP:  [5 cmH20] 5 cmH20  INTAKE / OUTPUT: No intake/output data recorded.  PHYSICAL EXAMINATION: General: acutely ill appearing AA female Neuro:  Sedated, unresponsive, pupils unequal right 1 mm sluggish round, left 3 mm sluggish round HEENT: supple, mild JVD Cardiovascular: tachycardic, s1s2, no M/R/G Lungs: diminished very faint expiratory wheezes throughout, labored, tachypneic mechanically intubated  Abdomen: +BS x4, soft, obese, non  tender, non distended  Musculoskeletal: normal bulk and tone, no edema Skin: intact no rashes or lesions  LABS:  BMET  Recent Labs Lab 03/10/17 2015  NA 142  K 5.0  CL 104  CO2 28  BUN 14  CREATININE 1.31*  GLUCOSE 218*    Electrolytes  Recent Labs Lab 03/10/17 2015  CALCIUM 8.8*    CBC  Recent Labs Lab 03/10/17 2015  WBC 11.3*  HGB 12.1  HCT 37.2  PLT 279    Coag's No results for input(s): APTT, INR in the last 168 hours.  Sepsis Markers  Recent Labs Lab 03/10/17 2050  LATICACIDVEN 4.3*    ABG  Recent Labs Lab 03/10/17 2050  PHART 7.00*  PCO2ART 114*  PO2ART 425*    Liver Enzymes  Recent Labs Lab 03/10/17 2015  AST 106*  ALT 70*  ALKPHOS 74  BILITOT 0.6  ALBUMIN 3.9    Cardiac Enzymes  Recent Labs Lab 03/10/17 2015  TROPONINI 0.04*    Glucose No results for input(s): GLUCAP in the last 168 hours.  Imaging Dg Chest Portable 1 View  Result Date: 04/09/2016 CLINICAL DATA:  Cardiac arrest, status post intubation EXAM: PORTABLE CHEST 1 VIEW COMPARISON:  03/22/2016 FINDINGS: Endotracheal tube is noted at the level of the aortic knob in satisfactory position. Cardiac shadow is within normal limits. The lungs are well aerated bilaterally. No acute bony abnormality is seen. IMPRESSION: Status post intubation in satisfactory position. No acute abnormality is noted. Electronically Signed   By: Alcide Clever M.D.   On: 04/11/2016 20:42   STUDIES:  None  CULTURES: Blood 01/22>> Influenza PCR 01/22>>  ANTIBIOTICS: None  SIGNIFICANT EVENTS: 01/22>>Pt admitted to ICU due PEA arrest secondary to acute on chronic hypercapnic respiratory failure secondary to AECOPD requiring mechanical intubation with initiation of hypothermic protocol  LINES/TUBES: ETT 01/22>> Right femoral CVL 01/22>>  ASSESSMENT / PLAN:  PULMONARY A: Acute on chronic hypercapnic respiratory failure secondary to AECOPD PEA arrest secondary to severe acidosis   Hx: Severe persistent asthma and Chronic home O2 at 4L P:   Full vent support Maintain O2 sats 90% to 94% Aggressive bronchodilator therapy IV steroids Will initiate hypothermic protocol for goal temp of 33 degree's Celsius for 24hrs CXR in am ABG's per hypothermic protocol   CARDIOVASCULAR A:  PEA arrest secondary to respiratory arrest with severe acidosis  Hx: Diastolic CHF and HTN P:  Maintain map greater than 80 mm during hypothermic protocol  Trend troponin's Cardiology consulted appreciate input Daily EKG's during hypothermic protocol APTT q8hrs  RENAL A:   Lactic acidosis likely secondary to Cardiac Arrest  Acute renal failure likely secondary to hypotension P:   Trend BMP's CVP monitoring q4hrs during hypothermic protocol  Sodium bicarb gtt for acidosis  Replace electrolytes as indicated Trend lactic acid Monitor UOP  GASTROINTESTINAL A:   No acute issues Hx: GERD  P:   Pepcid for PUD prophylaxis Keep NPO for now   HEMATOLOGIC A:   No acute issues  P: Trend CBC's Subq heparin for VTE prophylaxis Monitor for s/sx of bleeding  APTT q8hrs per hypothermic protocol   INFECTIOUS A:   Mild leukocytosis  P:   Trend WBC's and monitor fever curve Trend PCT's and lactic acid Follow cultures If pt becomes febrile or develops worsening leukocytosis will start abx   ENDOCRINE A:   Hyperglycemia  P:   CBG's q1hr Will initiate insulin gtt if remains hyperglycemic  NEUROLOGIC A:   Acute encephalopathy post PEA arrest Hx: Anxiety and Polysubstance abuse P:   RASS goal: -5 during cooling phase of hypothermic protocol Assess RASS every 4 hours  Nimbex, Versed, and Fentanyl gtts to maintain RASS goal  WUA daily once hypothermic protocol completed Will need EEG and CT of Head once Hypothermic protocol completed  Will need Neurology consult   FAMILY  - Updates: Updated pts daughter and explained poor prognosis discussed code status with pts daughter  she stated at this time she would like her mother to remain a Full code she understands there is a strong possibility her mother will cardiac arrest again.   - Inter-disciplinary family meet or Palliative Care meeting due by: 04/28/2016  Sonda Rumble, Juel Burrow  Pulmonary/Critical Care Pager [phone] (please enter 7 digits) PCCM Consult Pager [phone] (please enter 7 digits)

## 2016-04-21 NOTE — ED Notes (Signed)
Pt shocked at 120 joules.  

## 2016-04-21 NOTE — ED Notes (Signed)
Amp of Epinephrine given

## 2016-04-22 ENCOUNTER — Inpatient Hospital Stay: Payer: Medicaid Other

## 2016-04-22 ENCOUNTER — Inpatient Hospital Stay (HOSPITAL_COMMUNITY)
Admit: 2016-04-22 | Discharge: 2016-04-22 | Disposition: A | Discharge status: Home / Self Care | Payer: Medicaid Other | Attending: Physician Assistant | Admitting: Physician Assistant

## 2016-04-22 DIAGNOSIS — I5021 Acute systolic (congestive) heart failure: Secondary | ICD-10-CM

## 2016-04-22 DIAGNOSIS — N179 Acute kidney failure, unspecified: Secondary | ICD-10-CM

## 2016-04-22 DIAGNOSIS — I469 Cardiac arrest, cause unspecified: Secondary | ICD-10-CM

## 2016-04-22 DIAGNOSIS — I5032 Chronic diastolic (congestive) heart failure: Secondary | ICD-10-CM

## 2016-04-22 DIAGNOSIS — R092 Respiratory arrest: Secondary | ICD-10-CM | POA: Diagnosis present

## 2016-04-22 LAB — BASIC METABOLIC PANEL
ANION GAP: 9 (ref 5–15)
ANION GAP: 10 (ref 5–15)
ANION GAP: 10 (ref 5–15)
ANION GAP: 11 (ref 5–15)
ANION GAP: 10 (ref 5–15)
Anion gap: 11 (ref 5–15)
BUN: 21 mg/dL — ABNORMAL HIGH (ref 6–20)
BUN: 19 mg/dL (ref 6–20)
BUN: 19 mg/dL (ref 6–20)
BUN: 17 mg/dL (ref 6–20)
BUN: 15 mg/dL (ref 6–20)
BUN: 14 mg/dL (ref 6–20)
CALCIUM: 7.5 mg/dL — AB (ref 8.9–10.3)
CALCIUM: 7 mg/dL — AB (ref 8.9–10.3)
CALCIUM: 7.3 mg/dL — AB (ref 8.9–10.3)
CALCIUM: 7.6 mg/dL — AB (ref 8.9–10.3)
CALCIUM: 7.6 mg/dL — AB (ref 8.9–10.3)
CALCIUM: 7.7 mg/dL — AB (ref 8.9–10.3)
CHLORIDE: 108 mmol/L (ref 101–111)
CHLORIDE: 107 mmol/L (ref 101–111)
CO2: 27 mmol/L (ref 22–32)
CO2: 26 mmol/L (ref 22–32)
CO2: 30 mmol/L (ref 22–32)
CO2: 24 mmol/L (ref 22–32)
CO2: 23 mmol/L (ref 22–32)
CO2: 23 mmol/L (ref 22–32)
CREATININE: 1.22 mg/dL — AB (ref 0.44–1.00)
CREATININE: 0.96 mg/dL (ref 0.44–1.00)
CREATININE: 0.92 mg/dL (ref 0.44–1.00)
Chloride: 104 mmol/L (ref 101–111)
Chloride: 106 mmol/L (ref 101–111)
Chloride: 106 mmol/L (ref 101–111)
Chloride: 107 mmol/L (ref 101–111)
Creatinine, Ser: 0.74 mg/dL (ref 0.44–1.00)
Creatinine, Ser: 0.73 mg/dL (ref 0.44–1.00)
Creatinine, Ser: 0.68 mg/dL (ref 0.44–1.00)
GFR calc Af Amer: >60 mL/min (ref >60)
GFR calc Af Amer: >60 mL/min (ref >60)
GFR calc Af Amer: >60 mL/min (ref >60)
GFR calc Af Amer: >60 mL/min (ref >60)
GFR calc non Af Amer: 48 mL/min — ABNORMAL LOW (ref >60)
GFR calc non Af Amer: >60 mL/min (ref >60)
GFR calc non Af Amer: >60 mL/min (ref >60)
GFR, EST AFRICAN AMERICAN: 55 mL/min — AB (ref >60)
GLUCOSE: 175 mg/dL — AB (ref 65–99)
GLUCOSE: 185 mg/dL — AB (ref 65–99)
GLUCOSE: 165 mg/dL — AB (ref 65–99)
Glucose, Bld: 285 mg/dL — ABNORMAL HIGH (ref 65–99)
Glucose, Bld: 316 mg/dL — ABNORMAL HIGH (ref 65–99)
Glucose, Bld: 189 mg/dL — ABNORMAL HIGH (ref 65–99)
Potassium: 4.2 mmol/L (ref 3.5–5.1)
Potassium: 3 mmol/L — ABNORMAL LOW (ref 3.5–5.1)
Potassium: 2.6 mmol/L — CL (ref 3.5–5.1)
Potassium: 3.4 mmol/L — ABNORMAL LOW (ref 3.5–5.1)
Potassium: 3.4 mmol/L — ABNORMAL LOW (ref 3.5–5.1)
Potassium: 3.2 mmol/L — ABNORMAL LOW (ref 3.5–5.1)
SODIUM: 144 mmol/L (ref 135–145)
SODIUM: 143 mmol/L (ref 135–145)
Sodium: 144 mmol/L (ref 135–145)
Sodium: 141 mmol/L (ref 135–145)
Sodium: 140 mmol/L (ref 135–145)
Sodium: 140 mmol/L (ref 135–145)

## 2016-04-22 LAB — URINE DRUG SCREEN, QUALITATIVE (ARMC ONLY)
Amphetamines, Ur Screen: NOT DETECTED
BENZODIAZEPINE, UR SCRN: POSITIVE — AB
Barbiturates, Ur Screen: NOT DETECTED
CANNABINOID 50 NG, UR ~~LOC~~: NOT DETECTED
Cocaine Metabolite,Ur ~~LOC~~: NOT DETECTED
MDMA (ECSTASY) UR SCREEN: NOT DETECTED
Methadone Scn, Ur: NOT DETECTED
Opiate, Ur Screen: NOT DETECTED
PHENCYCLIDINE (PCP) UR S: NOT DETECTED
TRICYCLIC, UR SCREEN: NOT DETECTED

## 2016-04-22 LAB — CBC
HCT: 37.2 % (ref 35.0–47.0)
HEMOGLOBIN: 12.3 g/dL (ref 12.0–16.0)
MCH: 32.7 pg (ref 26.0–34.0)
MCHC: 33 g/dL (ref 32.0–36.0)
MCV: 99.3 fL (ref 80.0–100.0)
PLATELETS: 234 10*3/uL (ref 150–440)
RBC: 3.75 MIL/uL — AB (ref 3.80–5.20)
RDW: 13.6 % (ref 11.5–14.5)
WBC: 21.3 10*3/uL — ABNORMAL HIGH (ref 3.6–11.0)

## 2016-04-22 LAB — BLOOD GAS, ARTERIAL
ACID-BASE EXCESS: 3.9 mmol/L — AB (ref 0.0–2.0)
Acid-base deficit: 0.4 mmol/L (ref 0.0–2.0)
BICARBONATE: 24.8 mmol/L (ref 20.0–28.0)
BICARBONATE: 27.7 mmol/L (ref 20.0–28.0)
FIO2: 0.5
FIO2: 0.4
MECHANICAL RATE: 25
MECHVT: 500 mL
MECHVT: 500 mL
Mechanical Rate: 25
O2 Saturation: 99.7 %
O2 Saturation: 99.5 %
PCO2 ART: 42 mmHg (ref 32.0–48.0)
PEEP: 5 cmH2O
PEEP: 5 cmH2O
PH ART: 7.41 (ref 7.350–7.450)
PH ART: 7.47 — AB (ref 7.350–7.450)
Patient temperature: 34.8
Patient temperature: 37
pCO2 arterial: 38 mmHg (ref 32.0–48.0)
pO2, Arterial: 188 mmHg — ABNORMAL HIGH (ref 83.0–108.0)
pO2, Arterial: 161 mmHg — ABNORMAL HIGH (ref 83.0–108.0)

## 2016-04-22 LAB — ECHOCARDIOGRAM LIMITED
HEIGHTINCHES: 63 in
WEIGHTICAEL: 3037.06 [oz_av]

## 2016-04-22 LAB — GLUCOSE, CAPILLARY
GLUCOSE-CAPILLARY: 257 mg/dL — AB (ref 65–99)
GLUCOSE-CAPILLARY: 284 mg/dL — AB (ref 65–99)
GLUCOSE-CAPILLARY: 201 mg/dL — AB (ref 65–99)
GLUCOSE-CAPILLARY: 187 mg/dL — AB (ref 65–99)
GLUCOSE-CAPILLARY: 174 mg/dL — AB (ref 65–99)
GLUCOSE-CAPILLARY: 144 mg/dL — AB (ref 65–99)
GLUCOSE-CAPILLARY: 155 mg/dL — AB (ref 65–99)
Glucose-Capillary: 139 mg/dL — ABNORMAL HIGH (ref 65–99)
Glucose-Capillary: 147 mg/dL — ABNORMAL HIGH (ref 65–99)
Glucose-Capillary: 147 mg/dL — ABNORMAL HIGH (ref 65–99)
Glucose-Capillary: 153 mg/dL — ABNORMAL HIGH (ref 65–99)
Glucose-Capillary: 156 mg/dL — ABNORMAL HIGH (ref 65–99)
Glucose-Capillary: 168 mg/dL — ABNORMAL HIGH (ref 65–99)
Glucose-Capillary: 180 mg/dL — ABNORMAL HIGH (ref 65–99)
Glucose-Capillary: 210 mg/dL — ABNORMAL HIGH (ref 65–99)
Glucose-Capillary: 212 mg/dL — ABNORMAL HIGH (ref 65–99)
Glucose-Capillary: 238 mg/dL — ABNORMAL HIGH (ref 65–99)

## 2016-04-22 LAB — INFLUENZA PANEL BY PCR (TYPE A & B)
Influenza A By PCR: NEGATIVE
Influenza B By PCR: NEGATIVE

## 2016-04-22 LAB — MAGNESIUM
MAGNESIUM: 2.6 mg/dL — AB (ref 1.7–2.4)
MAGNESIUM: 2.5 mg/dL — AB (ref 1.7–2.4)
Magnesium: 2 mg/dL (ref 1.7–2.4)

## 2016-04-22 LAB — LACTIC ACID, PLASMA: Lactic Acid, Venous: 1.8 mmol/L (ref 0.5–1.9)

## 2016-04-22 LAB — ALBUMIN: ALBUMIN: 3.6 g/dL (ref 3.5–5.0)

## 2016-04-22 LAB — PHOSPHORUS
PHOSPHORUS: 3.5 mg/dL (ref 2.5–4.6)
Phosphorus: 1.3 mg/dL — ABNORMAL LOW (ref 2.5–4.6)
Phosphorus: 2.9 mg/dL (ref 2.5–4.6)

## 2016-04-22 LAB — TROPONIN I
Troponin I: 0.43 ng/mL (ref <0.03)
Troponin I: 0.61 ng/mL (ref <0.03)
Troponin I: 0.55 ng/mL (ref <0.03)

## 2016-04-22 LAB — PROCALCITONIN
PROCALCITONIN: 1.79 ng/mL
Procalcitonin: 3.87 ng/mL

## 2016-04-22 LAB — MRSA PCR SCREENING: MRSA by PCR: NEGATIVE

## 2016-04-22 LAB — APTT
APTT: 25 s (ref 24–36)
aPTT: 26 seconds (ref 24–36)

## 2016-04-22 MED ORDER — SODIUM CHLORIDE 0.9 % IV SOLN
INTRAVENOUS | Status: DC
  Administered 2016-04-22: 1.5 [IU]/h via INTRAVENOUS
  Filled 2016-04-22: qty 2.5

## 2016-04-22 MED ORDER — CEFEPIME-DEXTROSE 1 GM/50ML IV SOLR
1.0000 g | Freq: Three times a day (TID) | INTRAVENOUS | Status: DC
  Administered 2016-04-22 – 2016-04-28 (×19): 1 g via INTRAVENOUS
  Filled 2016-04-22 (×20): qty 50

## 2016-04-22 MED ORDER — SODIUM CHLORIDE 0.9 % IV SOLN
2.0000 g | Freq: Once | INTRAVENOUS | Status: AC
  Administered 2016-04-22: 2 g via INTRAVENOUS
  Filled 2016-04-22: qty 20

## 2016-04-22 MED ORDER — SODIUM CHLORIDE 0.9 % IV SOLN
Freq: Once | INTRAVENOUS | Status: AC
  Administered 2016-04-22: 12:00:00 via INTRAVENOUS
  Filled 2016-04-22: qty 20

## 2016-04-22 MED ORDER — SODIUM CHLORIDE 0.9 % IV SOLN
30.0000 meq | Freq: Once | INTRAVENOUS | Status: AC
  Administered 2016-04-22: 30 meq via INTRAVENOUS
  Filled 2016-04-22: qty 15

## 2016-04-22 MED ORDER — STERILE WATER FOR INJECTION IJ SOLN
INTRAMUSCULAR | Status: AC
  Administered 2016-04-22: 10 mL
  Filled 2016-04-22: qty 10

## 2016-04-22 MED ORDER — ALBUTEROL SULFATE (2.5 MG/3ML) 0.083% IN NEBU
2.5000 mg | INHALATION_SOLUTION | RESPIRATORY_TRACT | Status: DC | PRN
  Administered 2016-04-22: 2.5 mg via RESPIRATORY_TRACT

## 2016-04-22 MED ORDER — DEXTROSE 5 % IV SOLN
1.0000 g | Freq: Three times a day (TID) | INTRAVENOUS | Status: DC
  Filled 2016-04-22 (×3): qty 1

## 2016-04-22 MED ORDER — VECURONIUM BROMIDE 10 MG IV SOLR
10.0000 mg | Freq: Once | INTRAVENOUS | Status: AC
  Administered 2016-04-22: 10 mg via INTRAVENOUS

## 2016-04-22 MED ORDER — ACETAMINOPHEN 650 MG RE SUPP: 650.0000 mg | RECTAL | Status: DC | PRN

## 2016-04-22 MED ORDER — VECURONIUM BROMIDE 10 MG IV SOLR
INTRAVENOUS | Status: AC
  Filled 2016-04-22: qty 10

## 2016-04-22 MED ORDER — POTASSIUM PHOSPHATES 15 MMOLE/5ML IV SOLN
30.0000 mmol | Freq: Once | INTRAVENOUS | Status: AC
  Administered 2016-04-22: 30 mmol via INTRAVENOUS
  Filled 2016-04-22: qty 10

## 2016-04-22 MED ORDER — FENTANYL 2500MCG IN NS 250ML (10MCG/ML) PREMIX INFUSION
100.0000 ug/h | INTRAVENOUS | Status: AC
  Administered 2016-04-22 (×2): 200 ug/h via INTRAVENOUS
  Administered 2016-04-22: 100 ug/h via INTRAVENOUS
  Administered 2016-04-23 – 2016-04-24 (×4): 200 ug/h via INTRAVENOUS
  Filled 2016-04-22 (×6): qty 250

## 2016-04-22 MED ORDER — NOREPINEPHRINE BITARTRATE 1 MG/ML IV SOLN
2.0000 ug/min | INTRAVENOUS | Status: DC
  Administered 2016-04-22: 2 ug/min via INTRAVENOUS
  Administered 2016-04-23: 12 ug/min via INTRAVENOUS
  Administered 2016-04-23: 8 ug/min via INTRAVENOUS
  Administered 2016-04-23: 10 ug/min via INTRAVENOUS
  Administered 2016-04-23: 8 ug/min via INTRAVENOUS
  Administered 2016-04-24: 3 ug/min via INTRAVENOUS
  Administered 2016-04-24: 4 ug/min via INTRAVENOUS
  Filled 2016-04-22 (×6): qty 4

## 2016-04-22 MED ORDER — SODIUM CHLORIDE 0.9% FLUSH
10.0000 mL | Freq: Two times a day (BID) | INTRAVENOUS | Status: DC
  Administered 2016-04-22 – 2016-04-24 (×5): 10 mL
  Administered 2016-04-25: 30 mL
  Administered 2016-04-25 – 2016-04-30 (×10): 10 mL

## 2016-04-22 MED ORDER — ALBUTEROL SULFATE (2.5 MG/3ML) 0.083% IN NEBU
INHALATION_SOLUTION | RESPIRATORY_TRACT | Status: AC
  Filled 2016-04-22: qty 3

## 2016-04-22 MED ORDER — SODIUM CHLORIDE 0.9% FLUSH
10.0000 mL | INTRAVENOUS | Status: DC | PRN
  Administered 2016-04-24 – 2016-04-30 (×2): 10 mL
  Filled 2016-04-22 (×2): qty 40

## 2016-04-22 MED ORDER — FENTANYL 2500MCG IN NS 250ML (10MCG/ML) PREMIX INFUSION
INTRAVENOUS | Status: AC
  Administered 2016-04-22: 100 ug/h via INTRAVENOUS
  Filled 2016-04-22: qty 250

## 2016-04-22 NOTE — Progress Notes (Signed)
MEDICATION RELATED CONSULT NOTE   Pharmacy Consult for electrolytes/cefepime Indication: insulin drip, HCAP  Assessment: 60yoF found to have cardiac arrest secondary to AECOPD. Patient is on an insulin drip.   Goal of Therapy:  Keep electrolytes within normal limits.   Plan:  1. Electrolytes: K 3.0 -->  2.6, Corrected calcium 7.6, Mg 1.3, Phos 2.5.  Patient given potassium phosphate x1, potassium chloride IV x1, and calcium gluconate 2g IV x1. Patients insulin drip was turned off so that potassium can be corrected. Will recheck electrolytes tonight and replace IV as needed.  2. HCAP: Administer cefepime 1g IV Q8h. Procalcitonin elevated at 3.87.   Allergies  Allergen Reactions  . Symbicort [Budesonide-Formoterol Fumarate] Swelling and Other (See Comments)    Reaction:  Tongue swelling   . Formoterol Fumarate Swelling and Other (See Comments)    Reaction:  Tongue swelling    Patient Measurements: Height: 5\' 3"  (160 cm) Weight: 189 lb 13.1 oz (86.1 kg) IBW/kg (Calculated) : 52.4  Vital Signs: Temp: 89.1 F (31.7 C) (01/23 1300) Temp Source: Core (Comment) (01/23 1300) BP: 106/81 (01/23 1200) Pulse Rate: 91 (01/23 0900) Intake/Output from previous day: 01/22 0701 - 01/23 0700 In: 760.1 [I.V.:710.1; IV Piggyback:50] Out: 750 [Urine:600; Emesis/NG output:150] Intake/Output from this shift: Total I/O In: 1406.6 [I.V.:521.6; Other:175; NG/GT:30; IV Piggyback:680] Out: 1275 [Urine:1275]  Labs:  Recent Labs  04/20/2016 2015 04/22/16 0217 04/22/16 0500 04/22/16 0624 04/22/16 1021 04/22/16 1022  WBC 11.3*  --   --  21.3*  --   --   HGB 12.1  --   --  12.3  --   --   HCT 37.2  --   --  37.2  --   --   PLT 279  --   --  234  --   --   APTT  --   --   --  26 25  --   CREATININE 1.31* 1.22* 0.96  --   --  0.92  MG  --  2.6*  --  2.5*  --   --   PHOS  --  3.5  --  1.3*  --   --   ALBUMIN 3.9  --   --   --   --  3.6  PROT 6.4*  --   --   --   --   --   AST  106*  --   --   --   --   --   ALT 70*  --   --   --   --   --   ALKPHOS 74  --   --   --   --   --   BILITOT 0.6  --   --   --   --   --    Estimated Creatinine Clearance: 69.3 mL/min (by C-G formula based on SCr of 0.92 mg/dL).   Microbiology: Recent Results (from the past 720 hour(s))  MRSA PCR Screening     Status: None   Collection Time: 04/22/16  1:00 AM  Result Value Ref Range Status   MRSA by PCR NEGATIVE NEGATIVE Final    Comment:        The GeneXpert MRSA Assay (FDA approved for NASAL specimens only), is one component of a comprehensive MRSA colonization surveillance program. It is not intended to diagnose MRSA infection nor to guide or monitor treatment for MRSA infections.   Culture, blood (routine x 2)     Status: None (  Preliminary result)   Collection Time: 04/22/16  6:24 AM  Result Value Ref Range Status   Specimen Description BLOOD R AC  Final   Special Requests   Final    BOTTLES DRAWN AEROBIC AND ANAEROBIC AER 9ML ANA 4ML   Culture NO GROWTH < 12 HOURS  Final   Report Status PENDING  Incomplete    Medical History: Past Medical History:  Diagnosis Date  . Anxiety   . Asthma   . CHF (congestive heart failure) (HCC)   . Chronic respiratory failure (HCC)   . COPD (chronic obstructive pulmonary disease) (HCC)   . Depression   . GERD (gastroesophageal reflux disease)   . HTN (hypertension)    1/23 BCx: NGTD 1/23 MRSA PCR: negative  Delsa BernKelly m Basil Blakesley, PharmD 04/22/2016,1:20 PM

## 2016-04-22 NOTE — Care Management (Addendum)
Per previous RNCM note patient has [facility]. I am checking that status with Clydie BraunKaren. [facility] home [facility] is no longer following this patient. Patient is open to [facility] for nursing. Per nurse with [facility] patient does not follow dietary recommendations.

## 2016-04-22 NOTE — Procedures (Signed)
Central Venous Catheter Insertion Procedure Note Julie Foley [id number] 1955-10-22  Procedure: Insertion of Central Venous Catheter Indications: Assessment of intravascular volume, Drug and/or fluid administration and Frequent blood sampling  Procedure Details Consent: Risks of procedure as well as the alternatives and risks of each were explained to the (patient/caregiver).  Consent for procedure obtained. Time Out: Verified patient identification, verified procedure, site/side was marked, verified correct patient position, special equipment/implants available, medications/allergies/relevent history reviewed, required imaging and test results available.  Performed  Maximum sterile technique was used including antiseptics, cap, gloves, gown, hand hygiene, mask and sheet. Skin prep: Chlorhexidine; local anesthetic administered A antimicrobial bonded/coated triple lumen catheter was placed in the right femoral vein due to emergent situation using the Seldinger technique.  Evaluation Blood flow good Complications: No apparent complications Patient did tolerate procedure well. Chest X-ray ordered to verify placement.  CXR: Chest xray not indicated .  Emergent right femoral central line placed utilizing ultrasound no complications noted during procedure.  [name], AGNP  Pulmonary/Critical Care Pager [phone] (please enter 7 digits) PCCM Consult Pager [phone] (please enter 7 digits)

## 2016-04-22 NOTE — Progress Notes (Signed)
Inpatient Diabetes Program Recommendations  AACE/ADA: New Consensus Statement on Inpatient Glycemic Control (2015)  Target Ranges:  Prepandial:   less than 140 mg/dL      Peak postprandial:   less than 180 mg/dL (1-2 hours)      Critically ill patients:  140 - 180 mg/dL   Lab Results  Component Value Date   GLUCAP 201 (H) 04/22/2016   HGBA1C 5.8 12/08/2015    Review of Glycemic Control  Results for Maryruth BunCATES, Saryna L (MRN [id number]) as of 04/22/2016 09:17  Ref. Range 04/22/2016 04:03 04/22/2016 06:02 04/22/2016 06:49 04/22/2016 07:26 04/22/2016 09:09  Glucose-Capillary Latest Ref Range: 65 - 99 mg/dL 409284 (H) 811238 (H) 914210 (H) 212 (H) 201 (H)    Diabetes history: None noted Outpatient Diabetes medications: none Current orders for Inpatient glycemic control: ICU Glycemic control -IV insulin  Inpatient Diabetes Program Recommendations:  Continue IV insulin as ordered.   Susette RacerJulie Parish Augustine, RN, BA, MHA, CDE Diabetes Coordinator Inpatient Diabetes Program  [phone] (Team Pager) [phone] [facility] Office) 04/22/2016 9:20 AM

## 2016-04-22 NOTE — Consult Note (Signed)
Cardiology Consultation Note  Patient ID: Julie Foley, MRN: [id number], DOB/AGE: [birth year] 61 y.o. Admit date: 04/12/2016   Date of Consult: 04/22/2016 Primary Physician: [place] Primary Cardiologist: New to [facility] - consult by End Requesting Physician: Dr. Mortimer Fries, MD  Chief Complaint: Respiratory arrest Reason for Consult: PEA arrest  HPI: 61 y.o. female with h/o chronic respiratory failure on home oxygen at 2 L secondary to COPD (gold stage IV, FEV1 34% in 06/2009), chronic diastolic CHF, severe persistent asthma, morbid obesity, polysubstance abuse, anxiety with panic attacks and GERD who presented to [facility] on 1/22 s/p respiratory and PEA arrest.   Frequent ED visits and hospital admissions for respiratory failure 2/2 AECOPD.   Per ER notes the pt notified EMS due to c/o severe shortness of breath. According to pts daughter she was breathing ok the morning of 01/22.  Upon EMS arrival the pt was found to be in respiratory distress then went apneic became pulseless and then asystole ACLS protocol initiated with ROSC 10 minutes following CPR.  She was intubated in the field and transported to the ER.  Upon arrival to the ER she was breathing over the ventilator but not following commands, however she became bradycardic then went into PEA arrest ACLS protocol initiated she was administered 2 mg iv magnesium, 1 amp of bicarb, multiple doses of epi, and 1L fluid bolus with ROSC after 5 mins of CPR.  She coded again in the ER became hypotensive, bradycardic then went into a PEA arrest 1 amp epi, 1 amp sodium bicarb and chest compressions initiated with ROSC 2 minutes after interventions.  PCCM contacted to admit pt to ICU due PEA arrest secondary to acute on chronic hypercapnic respiratory failure secondary to AECOPD requiring mechanical intubation with initiation of hypothermic protocol.  Remains intubated this morning with a peak troponin of 0.43 at the time of cardiology consult. EKG  with sinus rhythm, 84 bpm, inferolateral TWI (old, though slightly more pronounced).   Past Medical History:  Diagnosis Date  . Anxiety   . Asthma   . CHF (congestive heart failure) ([place])   . Chronic respiratory failure ([place])   . COPD (chronic obstructive pulmonary disease) ([place])   . Depression   . GERD (gastroesophageal reflux disease)   . HTN (hypertension)       Most Recent Cardiac Studies: Echo 02/2016: Study Conclusions  - Left ventricle: The cavity size was normal. There was moderate   concentric hypertrophy. Systolic function was normal. The   estimated ejection fraction was in the range of 60% to 65%. Wall   motion was normal; there were no regional wall motion   abnormalities. Doppler parameters are consistent with abnormal   left ventricular relaxation (grade 1 diastolic dysfunction). - Left atrium: The atrium was normal in size. - Right ventricle: Systolic function was normal. - Pulmonary arteries: Systolic pressure was mild to moderately   elevated. PA peak pressure: 45 mm Hg (S).  Impressions:  - Sinus tachycardia noted rate 120 bpm.   Surgical History:  Past Surgical History:  Procedure Laterality Date  . TUBAL LIGATION Bilateral      Home Meds: Prior to Admission medications   Medication Sig Start Date End Date Taking? Authorizing Provider  albuterol (PROVENTIL HFA) 108 (90 Base) MCG/ACT inhaler Inhale 2 puffs into the lungs every 4 (four) hours as needed for wheezing or shortness of breath. 12/27/15   Carrie Mew, MD  albuterol (PROVENTIL) (2.5 MG/3ML) 0.083% nebulizer solution Take 3 mLs (  2.5 mg total) by nebulization every 6 (six) hours as needed for wheezing or shortness of breath. 12/27/15   Carrie Mew, MD  ALPRAZolam Duanne Moron) 0.5 MG tablet Take 1 tablet (0.5 mg total) by mouth 3 (three) times daily as needed for sleep or anxiety. Patient not taking: Reported on 03/20/2016 02/17/16 02/16/17  Loletha Grayer, MD  amLODipine (NORVASC) 5 MG  tablet Take 1 tablet (5 mg total) by mouth daily. Patient not taking: Reported on 03/20/2016 07/13/15   Wilhelmina Mcardle, MD  atenolol (TENORMIN) 50 MG tablet Take 1 tablet (50 mg total) by mouth daily. Patient not taking: Reported on 03/20/2016 07/13/15   Wilhelmina Mcardle, MD  beclomethasone (QVAR) 80 MCG/ACT inhaler Inhale 2 puffs into the lungs 2 (two) times daily.    Historical Provider, MD  EPINEPHrine 0.3 mg/0.3 mL IJ SOAJ injection Inject 0.3 mLs (0.3 mg total) into the muscle once. 08/17/15   Vaughan Basta, MD  fluticasone (FLONASE) 50 MCG/ACT nasal spray Place 1 spray into both nostrils 2 (two) times daily.     Historical Provider, MD  Fluticasone-Salmeterol (ADVAIR) 500-50 MCG/DOSE AEPB Inhale 1 puff into the lungs 2 (two) times daily.    Historical Provider, MD  hydrochlorothiazide (HYDRODIURIL) 25 MG tablet Take 1 tablet (25 mg total) by mouth daily. Patient not taking: Reported on 03/20/2016 07/13/15   Wilhelmina Mcardle, MD  ipratropium (ATROVENT) 0.02 % nebulizer solution Take 2.5 mLs (0.5 mg total) by nebulization 4 (four) times daily. 12/08/15   Lytle Butte, MD  loratadine (CLARITIN) 10 MG tablet Take 10 mg by mouth daily.    Historical Provider, MD  montelukast (SINGULAIR) 10 MG tablet Take 1 tablet (10 mg total) by mouth at bedtime. 07/13/15   Wilhelmina Mcardle, MD  nicotine (NICODERM CQ - DOSED IN MG/24 HOURS) 21 mg/24hr patch Place 1 patch (21 mg total) onto the skin daily. 02/18/16   Loletha Grayer, MD  nitroGLYCERIN (NITROSTAT) 0.4 MG SL tablet Place 0.4 mg under the tongue every 5 (five) minutes as needed for chest pain.    Historical Provider, MD  predniSONE (DELTASONE) 10 MG tablet Take 4 tablets (40 mg total) by mouth daily with breakfast. Start with 40 mg daily taper  By 10 mg daily then stop 03/24/16   Fritzi Mandes, MD  roflumilast (DALIRESP) 500 MCG TABS tablet Take 1 tablet (500 mcg total) by mouth daily. 07/13/15   Wilhelmina Mcardle, MD  tiotropium (SPIRIVA) 18 MCG inhalation  capsule Place 18 mcg into inhaler and inhale daily.    Historical Provider, MD    Inpatient Medications:  . albuterol      . artificial tears  1 application Both Eyes K4Q  . chlorhexidine gluconate (MEDLINE KIT)  15 mL Mouth Rinse BID  . cisatracurium  0.1 mg/kg Intravenous Once  . famotidine (PEPCID) IV  20 mg Intravenous Q12H  . fentaNYL (SUBLIMAZE) injection  100 mcg Intravenous Once  . heparin  5,000 Units Subcutaneous Q8H  . ipratropium-albuterol  3 mL Nebulization Q4H  . mouth rinse  15 mL Mouth Rinse 10 times per day  . methylPREDNISolone (SOLU-MEDROL) injection  40 mg Intravenous Q8H  . midazolam  2 mg Intravenous Once  . potassium phosphate IVPB (mmol)  30 mmol Intravenous Once  . sodium chloride flush  10-40 mL Intracatheter Q12H   . cisatracurium (NIMBEX) infusion 1 mcg/kg/min (04/22/16 0115)  . fentaNYL 200 mcg/hr (04/22/16 0831)  . insulin (NOVOLIN-R) infusion 4.2 Units/hr (04/22/16 0911)  . midazolam (VERSED)  infusion 2 mg/hr (04/22/16 0115)  . norepinephrine Stopped (04/22/16 0504)  .  sodium bicarbonate  infusion 1000 mL 75 mL/hr at 04/22/16 0200    Allergies:  Allergies  Allergen Reactions  . Symbicort [Budesonide-Formoterol Fumarate] Swelling and Other (See Comments)    Reaction:  Tongue swelling   . Formoterol Fumarate Swelling and Other (See Comments)    Reaction:  Tongue swelling    Social History   Social History  . Marital status: Single    Spouse name: N/A  . Number of children: N/A  . Years of education: N/A   Occupational History  . Not on file.   Social History Main Topics  . Smoking status: Current Every Day Smoker    Packs/day: 2.00    Years: 35.00    Types: Cigarettes  . Smokeless tobacco: Never Used  . Alcohol use 0.0 oz/week     Comment: ocassional beer drinker  . Drug use: No  . Sexual activity: Not Currently    Birth control/ protection: None   Other Topics Concern  . Not on file   Social History Narrative  . No narrative  on file     Family History  Problem Relation Age of Onset  . Cancer Mother   . Hypertension Mother   . Diabetes Mother   . Asthma Son      Review of Systems: Review of Systems  Unable to perform ROS: Intubated    Labs:  Recent Labs  04/22/2016 2015 04/22/16 0500  TROPONINI 0.04* 0.43*   Lab Results  Component Value Date   WBC 21.3 (H) 04/22/2016   HGB 12.3 04/22/2016   HCT 37.2 04/22/2016   MCV 99.3 04/22/2016   PLT 234 04/22/2016    Recent Labs Lab 04/11/2016 2015  04/22/16 0500  NA 142  < > 143  K 5.0  < > 3.0*  CL 104  < > 107  CO2 28  < > 26  BUN 14  < > 19  CREATININE 1.31*  < > 0.96  CALCIUM 8.8*  < > 7.0*  PROT 6.4*  --   --   BILITOT 0.6  --   --   ALKPHOS 74  --   --   ALT 70*  --   --   AST 106*  --   --   GLUCOSE 218*  < > 316*  < > = values in this interval not displayed. Lab Results  Component Value Date   CHOL 184 02/19/2013   HDL 101 (H) 02/19/2013   LDLCALC 76 02/19/2013   TRIG 55 03/19/2016   No results found for: DDIMER  Radiology/Studies:  Dg Abd 1 View  Result Date: 04/22/2016 CLINICAL DATA:  Orogastric tube placement EXAM: ABDOMEN - 1 VIEW COMPARISON:  March 17, 2016 FINDINGS: Orogastric tube tip and side port are in the stomach. Bowel gas pattern is unremarkable. No bowel obstruction or free air. Calcifications in the pelvis are likely due to calcified uterine leiomyomas. There is a right femoral catheter with the tip overlying the upper sacrum, likely in the right common iliac vein region. There is an apparent rectal thermometer. IMPRESSION: Orogastric tube tip and side port in stomach. No bowel obstruction or free air evident. Calcified uterine leiomyomas. Right femoral catheter tip most likely in right common iliac vein given its location. Electronically Signed   By: Lowella Grip III M.D.   On: 04/22/2016 08:28   Dg Chest Port 1 View  Result Date: 04/10/2016 CLINICAL DATA:  Acute onset of difficulty breathing. Initial  encounter. EXAM: PORTABLE CHEST 1 VIEW COMPARISON:  Chest radiograph performed earlier today at 8:20 p.m. FINDINGS: The patient's endotracheal tube is seen ending 2-3 cm above the carina. The lungs are well-aerated and clear. There is no evidence of focal opacification, pleural effusion or pneumothorax. The cardiomediastinal silhouette is within normal limits. No acute osseous abnormalities are seen. External pacing pads are noted. IMPRESSION: 1. Endotracheal tube seen ending 2-3 cm above the carina. 2. No acute cardiopulmonary process seen. Electronically Signed   By: Garald Balding M.D.   On: 04/13/2016 22:49   Dg Chest Portable 1 View  Result Date: 03/31/2016 CLINICAL DATA:  Cardiac arrest, status post intubation EXAM: PORTABLE CHEST 1 VIEW COMPARISON:  03/22/2016 FINDINGS: Endotracheal tube is noted at the level of the aortic knob in satisfactory position. Cardiac shadow is within normal limits. The lungs are well aerated bilaterally. No acute bony abnormality is seen. IMPRESSION: Status post intubation in satisfactory position. No acute abnormality is noted. Electronically Signed   By: Inez Catalina M.D.   On: 04/14/2016 20:42    EKG: Interpreted by me showed: NSR, 84 bpm, inferolateral TWI (old, though slightly more pronounced) Telemetry: Interpreted by me showed: NSR, 60's bpm  Weights: [facility] Weights   04/12/2016 2119 04/22/16 0016  Weight: 187 lb 13.3 oz (85.2 kg) 189 lb 13.1 oz (86.1 kg)     Physical Exam: Blood pressure (!) 116/91, pulse 91, temperature (!) 94.3 F (34.6 C), resp. rate (!) 25, height _0  (1.6 m), weight 189 lb 13.1 oz (86.1 kg), SpO2 98 %. Body mass index is 33.62 kg/m. General: Critically ill appearing. Intubated and sedated. Cooling protocol noted.  Head: Normocephalic, atraumatic, sclera non-icteric, no xanthomas, nares are without discharge.  Neck: Negative for carotid bruits. JVD not elevated. Lungs: Decreased breath sounds bilaterally. Intubated.  Heart: RRR  with S1 S2. No murmurs, rubs, or gallops appreciated. Abdomen: Obese, soft, non-tender, non-distended with normoactive bowel sounds. No hepatomegaly. No rebound/guarding. No obvious abdominal masses. Msk:  Strength and tone appear normal for age. Extremities: No clubbing or cyanosis. No edema. Distal pedal pulses are 2+ and equal bilaterally. Neuro: Intubated and sedated. Psych:  Intubated and sedated.    Assessment and Plan:  Principal Problem:   Respiratory arrest ([place]) Active Problems:   COPD exacerbation ([place])   Cardiac arrest ([place])   Chronic diastolic CHF (congestive heart failure) ([place])    1. Acute on chronic respiratory failure/respiratory arrest 2/2 AECOPD/asthma: -Intubated -Cooling protocol -Wean as able/per protocol per PCCM -Steroids/nebs per PCCM  2. PEA arrest: -Likely in the setting of electrolyte abnormalities  -Unlikely to be a primary cardiac event given the above and minimal troponin elevation at this time -Given the duration of the above, if this was a primary cardiac event, unlikely to have any salvageable myocardium  -No indication for urgent cardiac cath at this time -Check limited echo to evaluate EF and compare to recent echo in 02/2016 -If there is meaningful neurologic recovery, consider ischemic evaluation either prior to discharge or as an outpatient   3. Chronic diastolic CHF: -May need intermittent Lasix to prevent volume overload given multiple IV medications  4. Hypokalemia: -Being repleted via IV  5. AKI: -Likely ATN 2/2 hypotension -Improved   Signed, Christell Faith, PA-C [facility] Pager: [phone] 04/22/2016, 9:32 AM

## 2016-04-22 NOTE — Progress Notes (Signed)
PULMONARY / CRITICAL CARE MEDICINE   Name: Julie Foley MRN: [id number] DOB: [birth year]    ADMISSION DATE:  04/26/2016 CONSULTATION DATE:  04/20/2016  REFERRING MD:  Dr. Huel Cote   CHIEF COMPLAINT:  Cardiac Arrest   HISTORY OF PRESENT ILLNESS:   This is a 61 yo female with a PMH of HTN, GERD, Depression, Severe COPD (Gold stage IV, FEV1 34% 06/2009), Severe persistent asthma, Chronic home O2 at 4L, Diastolic CHF, Anxiety with panic attacks, and Polysubstance abuse.  She presented to [facility] ER 01/22 post PEA arrest.  Per ER notes the pt notified EMS due to c/o severe shortness of breath. According to pts daughter she was breathing ok the morning of 01/22.  Upon EMS arrival the pt was found to be in respiratory distress then went apneic became pulseless and then asystole ACLS protocol initiated with ROSC 10 minutes following CPR.  She was intubated in the field and transported to the ER.  Upon arrival to the ER she was breathing over the ventilator but not following commands, however she became bradycardic then went into PEA arrest ACLS protocol initiated she was administered 2 mg iv magnesium, 1 amp of bicarb, multiple doses of epi, and 1L fluid bolus with ROSC after 5 mins of CPR.  She coded again in the ER became hypotensive, bradycardic then went into a PEA arrest 1 amp epi, 1 amp sodium bicarb and chest compressions initiated with ROSC 2 minutes after interventions.  PCCM contacted to admit pt to ICU due PEA arrest secondary to acute on chronic hypercapnic respiratory failure secondary to AECOPD requiring mechanical intubation with initiation of hypothermic protocol. REVIEW OF SYSTEMS:   Unable to assess pt intubated   SUBJECTIVE:  Unable to assess pt intubated   VITAL SIGNS: BP (!) 130/104   Pulse 85   Temp (!) 91.8 F (33.2 C) (Core (Comment))   Resp (!) 25   Ht 5\' 3"  (1.6 m)   Wt 189 lb 13.1 oz (86.1 kg)   SpO2 97%   BMI 33.62 kg/m   HEMODYNAMICS: CVP:  [16 mmHg] 16  mmHg  VENTILATOR SETTINGS: Vent Mode: PRVC FiO2 (%):  [40 %-100 %] 40 % Set Rate:  [16 bmp-25 bmp] 25 bmp Vt Set:  [450 mL-500 mL] 500 mL PEEP:  [5 cmH20] 5 cmH20  INTAKE / OUTPUT: I/O last 3 completed shifts: In: 760.1 [I.V.:710.1; IV Piggyback:50] Out: 750 [Urine:600; Emesis/NG output:150]  PHYSICAL EXAMINATION: General: acutely ill appearing AA female Neuro:  Sedated, unresponsive, pupils unequal right 1 mm sluggish round, left 3 mm sluggish round HEENT: supple, mild JVD Cardiovascular: tachycardic, s1s2, no M/R/G Lungs: diminished very faint expiratory wheezes throughout, labored, tachypneic mechanically intubated  Abdomen: +BS x4, soft, obese, non tender, non distended  Musculoskeletal: normal bulk and tone, no edema Skin: intact no rashes or lesions  LABS:  BMET  Recent Labs Lab 04/22/2016 2015 04/22/16 0217 04/22/16 0500  NA 142 144 143  K 5.0 4.2 3.0*  CL 104 108 107  CO2 28 27 26   BUN 14 21* 19  CREATININE 1.31* 1.22* 0.96  GLUCOSE 218* 285* 316*    Electrolytes  Recent Labs Lab 04/29/2016 2015 04/22/16 0217 04/22/16 0500 04/22/16 0624  CALCIUM 8.8* 7.5* 7.0*  --   MG  --  2.6*  --  2.5*  PHOS  --  3.5  --  1.3*    CBC  Recent Labs Lab 04/28/2016 2015 04/22/16 0624  WBC 11.3* 21.3*  HGB 12.1 12.3  HCT  37.2 37.2  PLT 279 234    Coag's  Recent Labs Lab 04/22/16 0624  APTT 26    Sepsis Markers  Recent Labs Lab 04/14/2016 2050 04/22/16 0211 04/22/16 0213  LATICACIDVEN 4.3*  --  1.8  PROCALCITON  --  1.79  --     ABG  Recent Labs Lab 04/13/2016 2050 04/28/2016 2228 04/22/16 0219  PHART 7.00* 7.15* 7.41  PCO2ART 114* 79* 42  PO2ART 425* 274* 188*    Liver Enzymes  Recent Labs Lab 04/17/2016 2015  AST 106*  ALT 70*  ALKPHOS 74  BILITOT 0.6  ALBUMIN 3.9    Cardiac Enzymes  Recent Labs Lab 03/31/2016 2015 04/22/16 0500  TROPONINI 0.04* 0.43*    Glucose  Recent Labs Lab 04/22/16 0028 04/22/16 0255  04/22/16 0403 04/22/16 0602 04/22/16 0649 04/22/16 0726  GLUCAP 180* 257* 284* 238* 210* 212*    Imaging Dg Chest Port 1 View  Result Date: 04/13/2016 CLINICAL DATA:  Acute onset of difficulty breathing. Initial encounter. EXAM: PORTABLE CHEST 1 VIEW COMPARISON:  Chest radiograph performed earlier today at 8:20 p.m. FINDINGS: The patient's endotracheal tube is seen ending 2-3 cm above the carina. The lungs are well-aerated and clear. There is no evidence of focal opacification, pleural effusion or pneumothorax. The cardiomediastinal silhouette is within normal limits. No acute osseous abnormalities are seen. External pacing pads are noted. IMPRESSION: 1. Endotracheal tube seen ending 2-3 cm above the carina. 2. No acute cardiopulmonary process seen. Electronically Signed   By: Roanna Raider M.D.   On: 04/12/2016 22:49   Dg Chest Portable 1 View  Result Date: 04/03/2016 CLINICAL DATA:  Cardiac arrest, status post intubation EXAM: PORTABLE CHEST 1 VIEW COMPARISON:  03/22/2016 FINDINGS: Endotracheal tube is noted at the level of the aortic knob in satisfactory position. Cardiac shadow is within normal limits. The lungs are well aerated bilaterally. No acute bony abnormality is seen. IMPRESSION: Status post intubation in satisfactory position. No acute abnormality is noted. Electronically Signed   By: Alcide Clever M.D.   On: 04/04/2016 20:42   STUDIES:  None  CULTURES: Blood 01/22>> Influenza PCR 01/22>>  ANTIBIOTICS: None  SIGNIFICANT EVENTS: 01/22>>Pt admitted to ICU due PEA arrest secondary to acute on chronic hypercapnic respiratory failure secondary to AECOPD requiring mechanical intubation with initiation of hypothermic protocol 1/23-remains on hypothermia protocol 33 degrees  LINES/TUBES: ETT 01/22>> Right femoral CVL 01/22>>  ASSESSMENT / PLAN:  PULMONARY A: Acute on chronic hypercapnic respiratory failure secondary to AECOPD PEA arrest secondary to severe acidosis  Hx:  Severe persistent asthma and Chronic home O2 at 4L P:   Full vent support Maintain O2 sats 90% to 94% Aggressive bronchodilator therapy IV steroids -hypothermic protocol for goal temp of 33 degree's Celsius for 24hrs CXR in am ABG's per hypothermic protocol   CARDIOVASCULAR A:  PEA arrest secondary to respiratory arrest with severe acidosis  Hx: Diastolic CHF and HTN P:  Maintain map greater than 80 mm during hypothermic protocol  Trend troponin's Cardiology consulted appreciate input Daily EKG's during hypothermic protocol APTT q8hrs  RENAL A:   Lactic acidosis likely secondary to Cardiac Arrest  Acute renal failure likely secondary to hypotension P:   Trend BMP's CVP monitoring q4hrs during hypothermic protocol  Sodium bicarb gtt for acidosis  Replace electrolytes as indicated Trend lactic acid Monitor UOP  GASTROINTESTINAL A:   No acute issues Hx: GERD  P:   Pepcid for PUD prophylaxis Keep NPO for now   HEMATOLOGIC A:  No acute issues  P: Trend CBC's Subq heparin for VTE prophylaxis Monitor for s/sx of bleeding  APTT q8hrs per hypothermic protocol   INFECTIOUS A:   Mild leukocytosis  P:   Trend WBC's and monitor fever curve Trend PCT's and lactic acid Follow cultures If pt becomes febrile or develops worsening leukocytosis will start abx   ENDOCRINE A:   Hyperglycemia  P:   CBG's q1hr Will initiate insulin gtt if remains hyperglycemic  NEUROLOGIC A:   Acute encephalopathy post PEA arrest Hx: Anxiety and Polysubstance abuse P:   RASS goal: -5 during cooling phase of hypothermic protocol Assess RASS every 4 hours  Nimbex, Versed, and Fentanyl gtts to maintain RASS goal  WUA daily once hypothermic protocol completed Will need EEG and CT of Head once Hypothermic protocol completed  Will need Neurology consult  I have personally obtained a history, examined the patient, evaluated Pertinent laboratory and RadioGraphic/imaging results, and   formulated the assessment and plan   The Patient requires high complexity decision making for assessment and support, frequent evaluation and titration of therapies, application of advanced monitoring technologies and extensive interpretation of multiple databases. Critical Care Time devoted to patient care services described in this note is 35 minutes.   Overall, patient is critically ill, prognosis is guarded.  Patient with Multiorgan failure and at high risk for cardiac arrest and death.    Lucie LeatherKurian David Donnica Jarnagin, M.D.  [facility]  Medical Director [facility] Medical Director [facility]

## 2016-04-22 NOTE — Progress Notes (Signed)
*  PRELIMINARY RESULTS* Echocardiogram 2D Echocardiogram has been performed.  Julie Foley, Julie Foley 04/22/2016, 1:41 PM

## 2016-04-22 NOTE — Progress Notes (Signed)
MEDICATION RELATED CONSULT NOTE   Pharmacy Consult for electrolytes Indication: insulin drip, HCAP  Assessment: 60yoF found to have cardiac arrest secondary to AECOPD. Patient is on an insulin drip.    Plan:  All labs within normal limits except K slightly below goal; however being replaced with 30 mEq of KCl. Labs scheduled to be drawn with am labs.  1/23 22:00 K+ 3.2. 30 mEq KCl IV x1 ordered. Electrolyte labs in AM.   Allergies  Allergen Reactions  . Symbicort [Budesonide-Formoterol Fumarate] Swelling and Other (See Comments)    Reaction:  Tongue swelling   . Formoterol Fumarate Swelling and Other (See Comments)    Reaction:  Tongue swelling    Patient Measurements: Height: 5\' 3"  (160 cm) Weight: 189 lb 13.1 oz (86.1 kg) IBW/kg (Calculated) : 52.4  Vital Signs: Temp: 92.3 F (33.5 C) (01/23 2200) Temp Source: Core (Comment) (01/23 2200) BP: 115/78 (01/23 2200) Pulse Rate: 76 (01/23 2200) Intake/Output from previous day: 01/22 0701 - 01/23 0700 In: 760.1 [I.V.:710.1; IV Piggyback:50] Out: 750 [Urine:600; Emesis/NG output:150] Intake/Output from this shift: Total I/O In: -  Out: 250 [Urine:250]  Labs:  Recent Labs  04/18/2016 2015 04/22/16 0217  04/22/16 0624 04/22/16 1021 04/22/16 1022 04/22/16 1520 04/22/16 1811 04/22/16 2127  WBC 11.3*  --   --  21.3*  --   --   --   --   --   HGB 12.1  --   --  12.3  --   --   --   --   --   HCT 37.2  --   --  37.2  --   --   --   --   --   PLT 279  --   --  234  --   --   --   --   --   APTT  --   --   --  26 25  --   --   --   --   CREATININE 1.31* 1.22*  < >  --   --  0.92 0.74 0.73 0.68  MG  --  2.6*  --  2.5*  --   --   --  2.0  --   PHOS  --  3.5  --  1.3*  --   --   --  2.9  --   ALBUMIN 3.9  --   --   --   --  3.6  --   --   --   PROT 6.4*  --   --   --   --   --   --   --   --   AST 106*  --   --   --   --   --   --   --   --   ALT 70*  --   --   --   --   --   --   --   --   ALKPHOS 74  --   --   --    --   --   --   --   --   BILITOT 0.6  --   --   --   --   --   --   --   --   < > = values in this interval not displayed. Estimated Creatinine Clearance: 79.7 mL/min (by C-G formula based on SCr of 0.68 mg/dL).   Microbiology: Recent Results (from the past 720 hour(s))  MRSA PCR Screening     Status: None   Collection Time: 04/22/16  1:00 AM  Result Value Ref Range Status   MRSA by PCR NEGATIVE NEGATIVE Final    Comment:        The GeneXpert MRSA Assay (FDA approved for NASAL specimens only), is one component of a comprehensive MRSA colonization surveillance program. It is not intended to diagnose MRSA infection nor to guide or monitor treatment for MRSA infections.   Culture, blood (routine x 2)     Status: None (Preliminary result)   Collection Time: 04/22/16  6:24 AM  Result Value Ref Range Status   Specimen Description BLOOD R AC  Final   Special Requests   Final    BOTTLES DRAWN AEROBIC AND ANAEROBIC AER 9ML ANA 4ML   Culture NO GROWTH < 12 HOURS  Final   Report Status PENDING  Incomplete    Medical History: Past Medical History:  Diagnosis Date  . Anxiety   . Asthma   . CHF (congestive heart failure) (HCC)   . Chronic respiratory failure (HCC)   . COPD (chronic obstructive pulmonary disease) (HCC)   . Depression   . GERD (gastroesophageal reflux disease)   . HTN (hypertension)    1/23 BCx: NGTD 1/23 MRSA PCR: negative  Mariama Saintvil S, PharmD 04/22/2016,10:45 PM

## 2016-04-22 NOTE — Progress Notes (Signed)
MEDICATION RELATED CONSULT NOTE   Pharmacy Consult for electrolytes Indication: insulin drip, HCAP  Assessment: 60yoF found to have cardiac arrest secondary to AECOPD. Patient is on an insulin drip.    Plan:  Electrolytes: Potassium remains low, will order additional potassium 30mEq IV x 1. Will continue to monitor and replace with scheduled BMPs.    Allergies  Allergen Reactions  . Symbicort [Budesonide-Formoterol Fumarate] Swelling and Other (See Comments)    Reaction:  Tongue swelling   . Formoterol Fumarate Swelling and Other (See Comments)    Reaction:  Tongue swelling    Patient Measurements: Height: 5\' 3"  (160 cm) Weight: 189 lb 13.1 oz (86.1 kg) IBW/kg (Calculated) : 52.4  Vital Signs: Temp: 91.8 F (33.2 C) (01/23 1500) Temp Source: Core (Comment) (01/23 1500) BP: 102/78 (01/23 1500) Pulse Rate: 73 (01/23 1400) Intake/Output from previous day: 01/22 0701 - 01/23 0700 In: 760.1 [I.V.:710.1; IV Piggyback:50] Out: 750 [Urine:600; Emesis/NG output:150] Intake/Output from this shift: Total I/O In: 1336.6 [I.V.:521.6; Other:75; NG/GT:60; IV Piggyback:680] Out: 1600 [Urine:1600]  Labs:  Recent Labs  03/19/2017 2015 04/22/16 0217 04/22/16 0500 04/22/16 0624 04/22/16 1021 04/22/16 1022 04/22/16 1520  WBC 11.3*  --   --  21.3*  --   --   --   HGB 12.1  --   --  12.3  --   --   --   HCT 37.2  --   --  37.2  --   --   --   PLT 279  --   --  234  --   --   --   APTT  --   --   --  26 25  --   --   CREATININE 1.31* 1.22* 0.96  --   --  0.92 0.74  MG  --  2.6*  --  2.5*  --   --   --   PHOS  --  3.5  --  1.3*  --   --   --   ALBUMIN 3.9  --   --   --   --  3.6  --   PROT 6.4*  --   --   --   --   --   --   AST 106*  --   --   --   --   --   --   ALT 70*  --   --   --   --   --   --   ALKPHOS 74  --   --   --   --   --   --   BILITOT 0.6  --   --   --   --   --   --    Estimated Creatinine Clearance: 79.7 mL/min (by C-G formula based on SCr of 0.74  mg/dL).   Microbiology: Recent Results (from the past 720 hour(s))  MRSA PCR Screening     Status: None   Collection Time: 04/22/16  1:00 AM  Result Value Ref Range Status   MRSA by PCR NEGATIVE NEGATIVE Final    Comment:        The GeneXpert MRSA Assay (FDA approved for NASAL specimens only), is one component of a comprehensive MRSA colonization surveillance program. It is not intended to diagnose MRSA infection nor to guide or monitor treatment for MRSA infections.   Culture, blood (routine x 2)     Status: None (Preliminary result)   Collection Time: 04/22/16  6:24 AM  Result Value Ref Range Status   Specimen Description BLOOD R AC  Final   Special Requests   Final    BOTTLES DRAWN AEROBIC AND ANAEROBIC AER ANA   Culture NO GROWTH < 12 HOURS  Final   Report Status PENDING  Incomplete    Medical History: Past Medical History:  Diagnosis Date  . Anxiety   . Asthma   . CHF (congestive heart failure) (HCC)   . Chronic respiratory failure (HCC)   . COPD (chronic obstructive pulmonary disease) (HCC)   . Depression   . GERD (gastroesophageal reflux disease)   . HTN (hypertension)    1/23 BCx: NGTD 1/23 MRSA PCR: negative  Simpson,Michael L, PharmD 04/22/2016,4:19 PM

## 2016-04-22 NOTE — Progress Notes (Signed)
[facility] called.  Spoke with [employer] Energy CorporationVicky Berrier.  Ms. Jadene PieriniBerrier took pt's information and gave a reference number of [id number].  There will be aq representative coming out at approximately 9:00AM today to assess pt. Please call back if pt's status should change.

## 2016-04-22 NOTE — Progress Notes (Signed)
Initial Nutrition Assessment  DOCUMENTATION CODES:   Obesity unspecified  INTERVENTION:  -Recommend initiation of enteral nutrition as soon as clinically feasible. When ready to initiated TF, recommend ordering Vital High Protein via Adult Tube Feeding Protocol with goal rate of 50 ml/hr providing 1200 kcals, 106 g of protein and 1008 mL of free water   NUTRITION DIAGNOSIS:   Inadequate oral intake related to acute illness as evidenced by NPO status.  GOAL:   Provide needs based on ASPEN/SCCM guidelines  MONITOR:   TF tolerance, Labs, Weight trends, I & O's  REASON FOR ASSESSMENT:   Ventilator    ASSESSMENT:   61 yo female admitted with acute on chronic respiratory failure secondary to AECOPD, PEA arrest secondary to severe acidosis. Pt with hx of severe COPD, HTN, GERD, depression, severe asthma, CHF, polysubstance abuse.  Pt on vent support, TTM (hypothermia protocol with goal temp of 33 degrees)  Labs: phosphorus 1.3 (supplemented), potassium 2.6 (supplemented), magnesium 2.5 Meds: insulin drip  Diet Order:   NPO  Skin:  Reviewed, no issues  Last BM:  no documented BM  Height:   Ht Readings from Last 1 Encounters:  04/22/16 5\' 3"  (1.6 m)    Weight:   Wt Readings from Last 1 Encounters:  04/22/16 189 lb 13.1 oz (86.1 kg)    Ideal Body Weight:  52.3 kg  BMI:  Body mass index is 33.62 kg/m.  Estimated Nutritional Needs:   Kcal:  1000-1200 kcals  Protein:  >/= 105 g  Fluid:  >/= 2L   EDUCATION NEEDS:   No education needs identified at this time  Romelle StarcherCate Zadyn Yardley MS, RD, LDN [phone] Pager  [phone] Weekend/On-Call Pager

## 2016-04-22 NOTE — Consult Note (Signed)
[facility] paged for family support.  Upon arrival, no family present. 3:11 AM Julie Foley

## 2016-04-22 NOTE — Progress Notes (Signed)
eLink Physician-Brief Progress Note Patient Name: Julie BunSylvia L Foley DOB: 10/25/1955 MRN: [id number]   Date of Service  04/22/2016  HPI/Events of Note  ?MAP Goal is 80mmHg for Hypothermia Temp 33C   eICU Interventions  Reordered levophed.      Intervention Category Major Interventions: Hypotension - evaluation and management  Shane Crutchradeep Burr Soffer 04/22/2016, 9:24 PM

## 2016-04-22 NOTE — Progress Notes (Signed)
Attempted to place arterial femoral line, however unable to thread guidewire.    Julie Foley, AGNP  Pulmonary/Critical Care Pager [phone] (please enter 7 digits) PCCM Consult Pager [phone] (please enter 7 digits)

## 2016-04-22 NOTE — Progress Notes (Signed)
MEDICATION RELATED CONSULT NOTE   Pharmacy Consult for electrolytes Indication: insulin drip, HCAP  Assessment: 60yoF found to have cardiac arrest secondary to AECOPD. Patient is on an insulin drip.    Plan:  All labs within normal limits except K slightly below goal; however being replaced with 30 mEq of KCl. Labs scheduled to be drawn with am labs.   Allergies  Allergen Reactions  . Symbicort [Budesonide-Formoterol Fumarate] Swelling and Other (See Comments)    Reaction:  Tongue swelling   . Formoterol Fumarate Swelling and Other (See Comments)    Reaction:  Tongue swelling    Patient Measurements: Height: 5\' 3"  (160 cm) Weight: 189 lb 13.1 oz (86.1 kg) IBW/kg (Calculated) : 52.4  Vital Signs: Temp: 90.1 F (32.3 C) (01/23 1800) Temp Source: Core (Comment) (01/23 1600) BP: 109/80 (01/23 1700) Pulse Rate: 60 (01/23 1800) Intake/Output from previous day: 01/22 0701 - 01/23 0700 In: 760.1 [I.V.:710.1; IV Piggyback:50] Out: 750 [Urine:600; Emesis/NG output:150] Intake/Output from this shift: No intake/output data recorded.  Labs:  Recent Labs  04/10/2016 2015 04/22/16 0217  04/22/16 0624 04/22/16 1021 04/22/16 1022 04/22/16 1520 04/22/16 1811  WBC 11.3*  --   --  21.3*  --   --   --   --   HGB 12.1  --   --  12.3  --   --   --   --   HCT 37.2  --   --  37.2  --   --   --   --   PLT 279  --   --  234  --   --   --   --   APTT  --   --   --  26 25  --   --   --   CREATININE 1.31* 1.22*  < >  --   --  0.92 0.74 0.73  MG  --  2.6*  --  2.5*  --   --   --  2.0  PHOS  --  3.5  --  1.3*  --   --   --  2.9  ALBUMIN 3.9  --   --   --   --  3.6  --   --   PROT 6.4*  --   --   --   --   --   --   --   AST 106*  --   --   --   --   --   --   --   ALT 70*  --   --   --   --   --   --   --   ALKPHOS 74  --   --   --   --   --   --   --   BILITOT 0.6  --   --   --   --   --   --   --   < > = values in this interval not displayed. Estimated Creatinine Clearance: 79.7  mL/min (by C-G formula based on SCr of 0.73 mg/dL).   Microbiology: Recent Results (from the past 720 hour(s))  MRSA PCR Screening     Status: None   Collection Time: 04/22/16  1:00 AM  Result Value Ref Range Status   MRSA by PCR NEGATIVE NEGATIVE Final    Comment:        The GeneXpert MRSA Assay (FDA approved for NASAL specimens only), is one component of a comprehensive MRSA colonization  surveillance program. It is not intended to diagnose MRSA infection nor to guide or monitor treatment for MRSA infections.   Culture, blood (routine x 2)     Status: None (Preliminary result)   Collection Time: 04/22/16  6:24 AM  Result Value Ref Range Status   Specimen Description BLOOD R AC  Final   Special Requests   Final    BOTTLES DRAWN AEROBIC AND ANAEROBIC AER ANA   Culture NO GROWTH < 12 HOURS  Final   Report Status PENDING  Incomplete    Medical History: Past Medical History:  Diagnosis Date  . Anxiety   . Asthma   . CHF (congestive heart failure) (HCC)   . Chronic respiratory failure (HCC)   . COPD (chronic obstructive pulmonary disease) (HCC)   . Depression   . GERD (gastroesophageal reflux disease)   . HTN (hypertension)    1/23 BCx: NGTD 1/23 MRSA PCR: negative  Gavon Majano D, PharmD 04/22/2016,7:17 PM

## 2016-04-23 DIAGNOSIS — J441 Chronic obstructive pulmonary disease with (acute) exacerbation: Secondary | ICD-10-CM

## 2016-04-23 DIAGNOSIS — R092 Respiratory arrest: Secondary | ICD-10-CM

## 2016-04-23 LAB — BASIC METABOLIC PANEL
ANION GAP: 8 (ref 5–15)
ANION GAP: 8 (ref 5–15)
ANION GAP: 9 (ref 5–15)
ANION GAP: 8 (ref 5–15)
Anion gap: 10 (ref 5–15)
Anion gap: 8 (ref 5–15)
BUN: 11 mg/dL (ref 6–20)
BUN: 11 mg/dL (ref 6–20)
BUN: 13 mg/dL (ref 6–20)
BUN: 12 mg/dL (ref 6–20)
BUN: 14 mg/dL (ref 6–20)
BUN: 18 mg/dL (ref 6–20)
CALCIUM: 7.8 mg/dL — AB (ref 8.9–10.3)
CALCIUM: 8.3 mg/dL — AB (ref 8.9–10.3)
CALCIUM: 8.4 mg/dL — AB (ref 8.9–10.3)
CHLORIDE: 108 mmol/L (ref 101–111)
CHLORIDE: 111 mmol/L (ref 101–111)
CO2: 21 mmol/L — AB (ref 22–32)
CO2: 22 mmol/L (ref 22–32)
CO2: 23 mmol/L (ref 22–32)
CO2: 23 mmol/L (ref 22–32)
CO2: 23 mmol/L (ref 22–32)
CO2: 23 mmol/L (ref 22–32)
CREATININE: 0.58 mg/dL (ref 0.44–1.00)
Calcium: 7.6 mg/dL — ABNORMAL LOW (ref 8.9–10.3)
Calcium: 7.2 mg/dL — ABNORMAL LOW (ref 8.9–10.3)
Calcium: 8.4 mg/dL — ABNORMAL LOW (ref 8.9–10.3)
Chloride: 110 mmol/L (ref 101–111)
Chloride: 108 mmol/L (ref 101–111)
Chloride: 107 mmol/L (ref 101–111)
Chloride: 108 mmol/L (ref 101–111)
Creatinine, Ser: 0.49 mg/dL (ref 0.44–1.00)
Creatinine, Ser: 0.54 mg/dL (ref 0.44–1.00)
Creatinine, Ser: 0.62 mg/dL (ref 0.44–1.00)
Creatinine, Ser: 1.02 mg/dL — ABNORMAL HIGH (ref 0.44–1.00)
Creatinine, Ser: 0.98 mg/dL (ref 0.44–1.00)
GFR calc Af Amer: >60 mL/min (ref >60)
GFR calc Af Amer: >60 mL/min (ref >60)
GFR calc non Af Amer: >60 mL/min (ref >60)
GFR calc non Af Amer: >60 mL/min (ref >60)
GFR calc non Af Amer: >60 mL/min (ref >60)
GFR, EST NON AFRICAN AMERICAN: 59 mL/min — AB (ref >60)
GLUCOSE: 153 mg/dL — AB (ref 65–99)
Glucose, Bld: 160 mg/dL — ABNORMAL HIGH (ref 65–99)
Glucose, Bld: 154 mg/dL — ABNORMAL HIGH (ref 65–99)
Glucose, Bld: 162 mg/dL — ABNORMAL HIGH (ref 65–99)
Glucose, Bld: 135 mg/dL — ABNORMAL HIGH (ref 65–99)
Glucose, Bld: 141 mg/dL — ABNORMAL HIGH (ref 65–99)
POTASSIUM: 3.2 mmol/L — AB (ref 3.5–5.1)
POTASSIUM: 3.2 mmol/L — AB (ref 3.5–5.1)
POTASSIUM: 3.3 mmol/L — AB (ref 3.5–5.1)
Potassium: 3.7 mmol/L (ref 3.5–5.1)
Potassium: 4.2 mmol/L (ref 3.5–5.1)
Potassium: 4.6 mmol/L (ref 3.5–5.1)
SODIUM: 140 mmol/L (ref 135–145)
Sodium: 140 mmol/L (ref 135–145)
Sodium: 141 mmol/L (ref 135–145)
Sodium: 139 mmol/L (ref 135–145)
Sodium: 139 mmol/L (ref 135–145)
Sodium: 139 mmol/L (ref 135–145)

## 2016-04-23 LAB — GLUCOSE, CAPILLARY
GLUCOSE-CAPILLARY: 107 mg/dL — AB (ref 65–99)
GLUCOSE-CAPILLARY: 113 mg/dL — AB (ref 65–99)
GLUCOSE-CAPILLARY: 118 mg/dL — AB (ref 65–99)
GLUCOSE-CAPILLARY: 135 mg/dL — AB (ref 65–99)
GLUCOSE-CAPILLARY: 136 mg/dL — AB (ref 65–99)
GLUCOSE-CAPILLARY: 140 mg/dL — AB (ref 65–99)
Glucose-Capillary: 125 mg/dL — ABNORMAL HIGH (ref 65–99)
Glucose-Capillary: 113 mg/dL — ABNORMAL HIGH (ref 65–99)
Glucose-Capillary: 118 mg/dL — ABNORMAL HIGH (ref 65–99)
Glucose-Capillary: 150 mg/dL — ABNORMAL HIGH (ref 65–99)
Glucose-Capillary: 90 mg/dL (ref 65–99)

## 2016-04-23 LAB — MAGNESIUM
MAGNESIUM: 1.7 mg/dL (ref 1.7–2.4)
MAGNESIUM: 1.7 mg/dL (ref 1.7–2.4)

## 2016-04-23 LAB — PHOSPHORUS
Phosphorus: 2 mg/dL — ABNORMAL LOW (ref 2.5–4.6)
Phosphorus: 2.3 mg/dL — ABNORMAL LOW (ref 2.5–4.6)

## 2016-04-23 LAB — PROCALCITONIN: Procalcitonin: 4.19 ng/mL

## 2016-04-23 MED ORDER — INSULIN ASPART 100 UNIT/ML ~~LOC~~ SOLN
0.0000 [IU] | SUBCUTANEOUS | Status: DC
  Administered 2016-04-24: 1 [IU] via SUBCUTANEOUS
  Administered 2016-04-24: 2 [IU] via SUBCUTANEOUS
  Administered 2016-04-25 – 2016-04-26 (×4): 1 [IU] via SUBCUTANEOUS
  Administered 2016-04-26: 2 [IU] via SUBCUTANEOUS
  Administered 2016-04-26 – 2016-04-27 (×5): 1 [IU] via SUBCUTANEOUS
  Filled 2016-04-23 (×5): qty 1
  Filled 2016-04-23: qty 3
  Filled 2016-04-23 (×5): qty 1
  Filled 2016-04-23: qty 2

## 2016-04-23 MED ORDER — INSULIN ASPART 100 UNIT/ML ~~LOC~~ SOLN: 0.0000 [IU] | SUBCUTANEOUS | Status: DC

## 2016-04-23 MED ORDER — SODIUM CHLORIDE 0.9 % IV SOLN
2.0000 g | Freq: Once | INTRAVENOUS | Status: AC
  Administered 2016-04-23: 2 g via INTRAVENOUS
  Filled 2016-04-23: qty 20

## 2016-04-23 MED ORDER — PNEUMOCOCCAL VAC POLYVALENT 25 MCG/0.5ML IJ INJ: 0.5000 mL | INJECTION | INTRAMUSCULAR | Status: DC | PRN

## 2016-04-23 MED ORDER — POTASSIUM PHOSPHATES 15 MMOLE/5ML IV SOLN
16.0000 mmol | Freq: Once | INTRAVENOUS | Status: DC
  Filled 2016-04-23: qty 5.33

## 2016-04-23 MED ORDER — MAGNESIUM SULFATE 2 GM/50ML IV SOLN
2.0000 g | Freq: Once | INTRAVENOUS | Status: AC
  Administered 2016-04-23: 2 g via INTRAVENOUS
  Filled 2016-04-23: qty 50

## 2016-04-23 NOTE — Progress Notes (Signed)
1500 Versed and Nimbex turned off Fentanyl remains at 10. Patient coughs when suctioned. Opens eyes briefly. Does not respond to name or try to turn head.

## 2016-04-23 NOTE — Progress Notes (Signed)
MEDICATION RELATED CONSULT NOTE   Pharmacy Consult for electrolytes Indication: insulin drip, HCAP  Assessment: 60yoF found to have cardiac arrest secondary to AECOPD. Patient is on an insulin drip. Patient is now rewarmed from ICE protocol.    Plan: 1. Electrolytes: Labs within normal limits. Will continue to trend electrolytes as needed.  2. HCAP: Continue Cefepime 1g IV Q8h.   Allergies  Allergen Reactions  . Symbicort [Budesonide-Formoterol Fumarate] Swelling and Other (See Comments)    Reaction:  Tongue swelling   . Formoterol Fumarate Swelling and Other (See Comments)    Reaction:  Tongue swelling    Patient Measurements: Height: 5\' 3"  (160 cm) Weight: 189 lb 13.1 oz (86.1 kg) IBW/kg (Calculated) : 52.4  Vital Signs: Temp: 97.3 F (36.3 C) (01/24 1400) Temp Source: Core (Comment) (01/24 0600) BP: 113/68 (01/24 1400) Pulse Rate: 109 (01/24 0800) Intake/Output from previous day: 01/23 0701 - 01/24 0700 In: 2278.7 [I.V.:968.7; NG/GT:90; IV Piggyback:1145] Out: 2450 [Urine:2350; Emesis/NG output:100] Intake/Output from this shift: Total I/O In: 425.8 [I.V.:95.8; NG/GT:30; IV Piggyback:300] Out: 175 [Urine:175]  Labs:  Recent Labs  02/06/17 2015  04/22/16 0624 04/22/16 1021 04/22/16 1022  04/22/16 1811  04/23/16 0500 04/23/16 0951 04/23/16 1401  WBC 11.3*  --  21.3*  --   --   --   --   --   --   --   --   HGB 12.1  --  12.3  --   --   --   --   --   --   --   --   HCT 37.2  --  37.2  --   --   --   --   --   --   --   --   PLT 279  --  234  --   --   --   --   --   --   --   --   APTT  --   --  26 25  --   --   --   --   --   --   --   CREATININE 1.31*  < >  --   --  0.92  < > 0.73  < > 0.49 0.54 0.62  MG  --   < > 2.5*  --   --   --  2.0  --  1.7 1.7  --   PHOS  --   < > 1.3*  --   --   --  2.9  --  2.0* 2.3*  --   ALBUMIN 3.9  --   --   --  3.6  --   --   --   --   --   --   PROT 6.4*  --   --   --   --   --   --   --   --   --   --   AST 106*  --    --   --   --   --   --   --   --   --   --   ALT 70*  --   --   --   --   --   --   --   --   --   --   ALKPHOS 74  --   --   --   --   --   --   --   --   --   --  BILITOT 0.6  --   --   --   --   --   --   --   --   --   --   < > = values in this interval not displayed. Estimated Creatinine Clearance: 79.7 mL/min (by C-G formula based on SCr of 0.62 mg/dL).   Microbiology: Recent Results (from the past 720 hour(s))  MRSA PCR Screening     Status: None   Collection Time: 04/22/16  1:00 AM  Result Value Ref Range Status   MRSA by PCR NEGATIVE NEGATIVE Final    Comment:        The GeneXpert MRSA Assay (FDA approved for NASAL specimens only), is one component of a comprehensive MRSA colonization surveillance program. It is not intended to diagnose MRSA infection nor to guide or monitor treatment for MRSA infections.   Culture, blood (routine x 2)     Status: None (Preliminary result)   Collection Time: 04/22/16  6:24 AM  Result Value Ref Range Status   Specimen Description BLOOD R AC  Final   Special Requests   Final    BOTTLES DRAWN AEROBIC AND ANAEROBIC AER ANA   Culture NO GROWTH 1 DAY  Final   Report Status PENDING  Incomplete  Culture, blood (routine x 2)     Status: None (Preliminary result)   Collection Time: 04/22/16 10:22 AM  Result Value Ref Range Status   Specimen Description BLOOD R AC  Final   Special Requests BOTTLES DRAWN AEROBIC AND ANAEROBIC  Final   Culture NO GROWTH < 24 HOURS  Final   Report Status PENDING  Incomplete    Medical History: Past Medical History:  Diagnosis Date  . Anxiety   . Asthma   . CHF (congestive heart failure) (HCC)   . Chronic respiratory failure (HCC)   . COPD (chronic obstructive pulmonary disease) (HCC)   . Depression   . GERD (gastroesophageal reflux disease)   . HTN (hypertension)    1/23 BCx: NGTD 1/23 MRSA PCR: negative  Delsa Bern, PharmD 04/23/2016,4:11 PM

## 2016-04-23 NOTE — Progress Notes (Addendum)
1600 Patient biting tube- unable to bag patient due to biting.Bite block placed in mouth. More awake. Overbreathing ventilator at 40 breaths per minute. Heart rate up to 160-170s. Oxygen saturation dropped to the high 60s. B/P up to 154/103 Levophed stopped.  Respiratory therapist in to assist. Patient bagged, bite block readjusted and Dr. Belia HemanKasa paged. E-link nurse in to offer assistance.

## 2016-04-23 NOTE — Progress Notes (Addendum)
1615 D. Blakeney NP busy but Dr. Belia HemanKasa answered page.Sedation restarted. Oxygenation improving per increase of SPO2 monitor.Heart rate slowly decreasing.Levophed restarted due to hypotension at 4 mg/min.

## 2016-04-23 NOTE — Progress Notes (Signed)
1645 Heart rate decreased to125. B/P up to 94/64. Patient calm with respiratory rate at 25. Rewarming goal of 37 reached. ArticSun therapy stopped. Continuing to monitor patients temperature via foley temperature port.

## 2016-04-23 NOTE — Progress Notes (Addendum)
1830 Rewarmed successfully. Unable to wean sedation. Remains on Fentanyl at 200mg /hour and Versed at 5mg /hour. Levophed titrated up to 1912mcg/min to keep systolic blood pressure mean at 70. Opens eyes at times but pupils remain at 2mm and nonreactive. Has not attempted to move extremities even to pain. Train of 4 shows reaction to all 4 twitches. Cough reflex noted when suctioned. No gag reflex noted.

## 2016-04-23 NOTE — Progress Notes (Signed)
Late note 1500 Dr. Belia HemanKasa notified of drop in urine output to 10-5415ml per hour.

## 2016-04-23 NOTE — Progress Notes (Signed)
1530 Fentanyl turned off. Trying to bite endotrachial tube at times.

## 2016-04-23 NOTE — Progress Notes (Addendum)
1430 Fentanyl now at 10mg /hr and Nimbex now at .5 mcg/kg/min. Versed decreased to 2.5mg /hr.

## 2016-04-23 NOTE — Progress Notes (Signed)
Dr. Belia HemanKasa notified of  Hypotension with warming phase. Levophed titrated as needed to maintain  MAP goal of 80.

## 2016-04-23 NOTE — Progress Notes (Signed)
1620 Remains hypotensive Levophed returned to 8 mg/min

## 2016-04-23 NOTE — Progress Notes (Signed)
Patient Name: MAXENE BYINGTON Date of Encounter: 04/23/2016  Primary Cardiologist: New to [facility] - consult by End  Hospital Problem List     Principal Problem:   Respiratory arrest [facility]) Active Problems:   COPD exacerbation ([place])   Cardiac arrest ([place])   Chronic diastolic CHF (congestive heart failure) (HCC)     Subjective   No acute overnight events. Remains intubated and sedated. Re-warming procedure started at 3 AM. Echo as below. Troponin >65.   Inpatient Medications    Scheduled Meds: . artificial tears  1 application Both Eyes S9Q  . calcium gluconate  2 g Intravenous Once  . ceFEPIme  1 g Intravenous Q8H  . chlorhexidine gluconate (MEDLINE KIT)  15 mL Mouth Rinse BID  . cisatracurium  0.1 mg/kg Intravenous Once  . famotidine (PEPCID) IV  20 mg Intravenous Q12H  . fentaNYL (SUBLIMAZE) injection  100 mcg Intravenous Once  . heparin  5,000 Units Subcutaneous Q8H  . ipratropium-albuterol  3 mL Nebulization Q4H  . magnesium sulfate 1 - 4 g bolus IVPB  2 g Intravenous Once  . mouth rinse  15 mL Mouth Rinse 10 times per day  . methylPREDNISolone (SOLU-MEDROL) injection  40 mg Intravenous Q8H  . midazolam  2 mg Intravenous Once  . potassium phosphate IVPB (mmol)  16 mmol Intravenous Once  . sodium chloride flush  10-40 mL Intracatheter Q12H   Continuous Infusions: . cisatracurium (NIMBEX) infusion 1 mcg/kg/min (04/22/16 2256)  . fentaNYL 200 mcg/hr (04/23/16 1051)  . midazolam (VERSED) infusion 5 mg/hr (04/23/16 0700)  . norepinephrine (LEVOPHED) Adult infusion 7.013 mcg/min (04/23/16 1000)   PRN Meds: albuterol, cisatracurium **AND** cisatracurium (NIMBEX) infusion **AND** cisatracurium, fentaNYL, ipratropium-albuterol, midazolam, pneumococcal 23 valent vaccine, sodium chloride flush   Vital Signs    Vitals:   04/23/16 1015 04/23/16 1030 04/23/16 1045 04/23/16 1100  BP: 124/77 137/86 114/75 109/72  Pulse:      Resp: (!) 25 (!) 25 (!) 25 (!) 25  Temp:    (!)  95.4 F (35.2 C)  TempSrc:      SpO2:      Weight:      Height:        Intake/Output Summary (Last 24 hours) at 04/23/16 1108 Last data filed at 04/23/16 1043  Gross per 24 hour  Intake           1672.5 ml  Output             1440 ml  Net            232.5 ml   Filed Weights   04/16/2016 2119 04/22/16 0016  Weight: 187 lb 13.3 oz (85.2 kg) 189 lb 13.1 oz (86.1 kg)    Physical Exam    GEN: Critically ill appearing, in no acute distress. Intubated.  HEENT: Grossly normal.  Neck: Supple, no JVD, carotid bruits, or masses. Cardiac: RRR, no murmurs, rubs, or gallops. No clubbing, cyanosis, edema.  Radials/DP/PT 2+ and equal bilaterally.  Respiratory:  Decreased breath sounds bilaterally. Intubated.  GI: Soft, nontender, nondistended, BS + x 4. MS: no deformity or atrophy. Skin: warm and dry, no rash. Neuro:  Intubated and sedated. Psych: Intubated and sedated.  Labs    CBC  Recent Labs  04/02/2016 2015 04/22/16 0624  WBC 11.3* 21.3*  NEUTROABS 6.7*  --   HGB 12.1 12.3  HCT 37.2 37.2  MCV 101.8* 99.3  PLT 279 330   Basic Metabolic Panel  Recent Labs  04/23/16 0500 04/23/16 0951  NA 140 141  K 3.2* 3.3*  CL 111 110  CO2 21* 23  GLUCOSE 153* 154*  BUN 11 11  CREATININE 0.49 0.54  CALCIUM 7.2* 7.8*  MG 1.7 1.7  PHOS 2.0* 2.3*   Liver Function Tests  Recent Labs  04/19/2016 2015 04/22/16 1022  AST 106*  --   ALT 70*  --   ALKPHOS 74  --   BILITOT 0.6  --   PROT 6.4*  --   ALBUMIN 3.9 3.6   No results for input(s): LIPASE, AMYLASE in the last 72 hours. Cardiac Enzymes  Recent Labs  04/22/16 0500 04/22/16 1021 04/22/16 1520  TROPONINI 0.43* 0.61* 0.55*   BNP Invalid input(s): POCBNP D-Dimer No results for input(s): DDIMER in the last 72 hours. Hemoglobin A1C No results for input(s): HGBA1C in the last 72 hours. Fasting Lipid Panel No results for input(s): CHOL, HDL, LDLCALC, TRIG, CHOLHDL, LDLDIRECT in the last 72 hours. Thyroid Function  Tests No results for input(s): TSH, T4TOTAL, T3FREE, THYROIDAB in the last 72 hours.  Invalid input(s): FREET3  Telemetry    sinus - Personally Reviewed  ECG    n/a - Personally Reviewed  Radiology    Dg Abd 1 View  Result Date: 04/22/2016 CLINICAL DATA:  Orogastric tube placement EXAM: ABDOMEN - 1 VIEW COMPARISON:  March 17, 2016 FINDINGS: Orogastric tube tip and side port are in the stomach. Bowel gas pattern is unremarkable. No bowel obstruction or free air. Calcifications in the pelvis are likely due to calcified uterine leiomyomas. There is a right femoral catheter with the tip overlying the upper sacrum, likely in the right common iliac vein region. There is an apparent rectal thermometer. IMPRESSION: Orogastric tube tip and side port in stomach. No bowel obstruction or free air evident. Calcified uterine leiomyomas. Right femoral catheter tip most likely in right common iliac vein given its location. Electronically Signed   By: Lowella Grip III M.D.   On: 04/22/2016 08:28   Dg Chest Port 1 View  Result Date: 04/09/2016 CLINICAL DATA:  Acute onset of difficulty breathing. Initial encounter. EXAM: PORTABLE CHEST 1 VIEW COMPARISON:  Chest radiograph performed earlier today at 8:20 p.m. FINDINGS: The patient's endotracheal tube is seen ending 2-3 cm above the carina. The lungs are well-aerated and clear. There is no evidence of focal opacification, pleural effusion or pneumothorax. The cardiomediastinal silhouette is within normal limits. No acute osseous abnormalities are seen. External pacing pads are noted. IMPRESSION: 1. Endotracheal tube seen ending 2-3 cm above the carina. 2. No acute cardiopulmonary process seen. Electronically Signed   By: Garald Balding M.D.   On: 04/28/2016 22:49   Dg Chest Portable 1 View  Result Date: 04/02/2016 CLINICAL DATA:  Cardiac arrest, status post intubation EXAM: PORTABLE CHEST 1 VIEW COMPARISON:  03/22/2016 FINDINGS: Endotracheal tube is  noted at the level of the aortic knob in satisfactory position. Cardiac shadow is within normal limits. The lungs are well aerated bilaterally. No acute bony abnormality is seen. IMPRESSION: Status post intubation in satisfactory position. No acute abnormality is noted. Electronically Signed   By: Inez Catalina M.D.   On: 04/16/2016 20:42    Cardiac Studies   Echo 04/22/2016: Study Conclusions  - Limited study for evaluation of left ventricular systolic   function. - Left ventricle: The cavity size was normal. Wall thickness was   increased in a pattern of mild LVH. Systolic function was   severely reduced. The estimated ejection  fraction was in the   range of 20% to 25%. - Right ventricle: Systolic function was reduced.  Patient Profile     61 y.o. female with history of chronic respiratory failure on home oxygen at 2 L secondary to COPD (gold stage IV, FEV1 34% in 06/2009), chronic diastolic CHF, severe persistent asthma, morbid obesity, polysubstance abuse, anxiety with panic attacks, GERD, and frequent admissions/ED visits for SOB in the setting of substance abuse who presented to [facility] on 1/22 s/p respiratory and PEA arrest.   Assessment & Plan    1. Acute on chronic respiratory failure/respiratory arrest 2/2 AECOPD/asthma: -Intubated -Rewarming started at 3 AM -Monitor for any positive neurologic signs -Wean as able/per protocol per PCCM -Steroids/nebs per PCCM  2. PEA arrest: -Likely in the setting of electrolyte abnormalities  -Cannot exclude primary cardiac event at this time given troponin > 65  -Given the duration of the above, if this was a primary cardiac event, unlikely to have any salvageable myocardium  -No indication for urgent cardiac cath at this time -Echo with newly reduced EF of 25% -Start HF medications when able 2/2 hypotension requiring pressors -If there is meaningful neurologic recovery, consider ischemic evaluation either prior to discharge or as an  outpatient   3. Acute systolic and acute on chronic diastolic CHF: -May need intermittent Lasix to prevent volume overload given multiple IV medications -As above  4. Hypokalemia: -Being repleted via IV  5. AKI: -Likely ATN 2/2 hypotension -Improved   Signed, Christell Faith, PA-C [facility] Pager: [phone] 04/23/2016, 11:08 AM

## 2016-04-23 NOTE — Progress Notes (Signed)
PULMONARY / CRITICAL CARE MEDICINE   Name: Julie Foley MRN: [id number] DOB: Nov 30, 1955    ADMISSION DATE:  05-21-16 CONSULTATION DATE:  May 21, 2016  REFERRING MD:  Dr. Huel Cote   CHIEF COMPLAINT:  Cardiac Arrest   HISTORY OF PRESENT ILLNESS:   This is a 61 yo female with a PMH of HTN, GERD, Depression, Severe COPD (Gold stage IV, FEV1 34% 06/2009), Severe persistent asthma, Chronic home O2 at 4L, Diastolic CHF, Anxiety with panic attacks, and Polysubstance abuse.  She presented to [facility] ER 01/22 post PEA arrest.  Per ER notes the pt notified EMS due to c/o severe shortness of breath. According to pts daughter she was breathing ok the morning of 01/22.  Upon EMS arrival the pt was found to be in respiratory distress then went apneic became pulseless and then asystole ACLS protocol initiated with ROSC 10 minutes following CPR.  She was intubated in the field and transported to the ER.  Upon arrival to the ER she was breathing over the ventilator but not following commands, however she became bradycardic then went into PEA arrest ACLS protocol initiated she was administered 2 mg iv magnesium, 1 amp of bicarb, multiple doses of epi, and 1L fluid bolus with ROSC after 5 mins of CPR.  She coded again in the ER became hypotensive, bradycardic then went into a PEA arrest 1 amp epi, 1 amp sodium bicarb and chest compressions initiated with ROSC 2 minutes after interventions.  PCCM contacted to admit pt to ICU due PEA arrest secondary to acute on chronic hypercapnic respiratory failure secondary to AECOPD requiring mechanical intubation with initiation of hypothermic protocol.  REVIEW OF SYSTEMS:   Unable to assess pt intubated   SUBJECTIVE:  Unable to assess pt intubated  On rewarming phase Remains critically ill  VITAL SIGNS: BP 99/75   Pulse 67   Temp (!) 93.6 F (34.2 C)   Resp (!) 25   Ht 5\' 3"  (1.6 m)   Wt 189 lb 13.1 oz (86.1 kg)   SpO2 98%   BMI 33.62 kg/m   HEMODYNAMICS: CVP:  [9  mmHg-16 mmHg] 11 mmHg  VENTILATOR SETTINGS: Vent Mode: PRVC FiO2 (%):  [30 %-40 %] 30 % Set Rate:  [25 bmp] 25 bmp Vt Set:  [500 mL] 500 mL PEEP:  [5 cmH20] 5 cmH20  INTAKE / OUTPUT: I/O last 3 completed shifts: In: 3013.6 [I.V.:1653.6; Other:75; NG/GT:90; IV Piggyback:1195] Out: 3200 [Urine:2900; Emesis/NG output:300]  PHYSICAL EXAMINATION: General: acutely ill appearing AA female Neuro:  Sedated, unresponsive, pupils unequal right 1 mm sluggish round, left 3 mm sluggish round HEENT: supple, mild JVD Cardiovascular: tachycardic, s1s2, no M/R/G Lungs: diminished very faint expiratory wheezes throughout, labored, tachypneic mechanically intubated  Abdomen: +BS x4, soft, obese, non tender, non distended  Musculoskeletal: normal bulk and tone, no edema Skin: intact no rashes or lesions GCS<8 LABS:  BMET  Recent Labs Lab 04/22/16 2127 04/23/16 0058 04/23/16 0500  NA 140 140 140  K 3.2* 3.2* 3.2*  CL 107 108 111  CO2 23 22 21*  BUN 14 13 11   CREATININE 0.68 0.58 0.49  GLUCOSE 165* 160* 153*    Electrolytes  Recent Labs Lab 04/22/16 0624  04/22/16 1811 04/22/16 2127 04/23/16 0058 04/23/16 0500  CALCIUM  --   < > 7.6* 7.7* 7.6* 7.2*  MG 2.5*  --  2.0  --   --  1.7  PHOS 1.3*  --  2.9  --   --  2.0*  < > =  values in this interval not displayed.  CBC  Recent Labs Lab 10-02-2016 2015 04/22/16 0624  WBC 11.3* 21.3*  HGB 12.1 12.3  HCT 37.2 37.2  PLT 279 234    Coag's  Recent Labs Lab 04/22/16 0624 04/22/16 1021  APTT 26 25    Sepsis Markers  Recent Labs Lab 10-02-2016 2050 04/22/16 0211 04/22/16 0213 04/22/16 0624 04/23/16 0500  LATICACIDVEN 4.3*  --  1.8  --   --   PROCALCITON  --  1.79  --  3.87 4.19    ABG  Recent Labs Lab 10-02-2016 2228 04/22/16 0219 04/22/16 1102  PHART 7.15* 7.41 7.47*  PCO2ART 79* 42 38  PO2ART 274* 188* 161*    Liver Enzymes  Recent Labs Lab 10-02-2016 2015 04/22/16 1022  AST 106*  --   ALT 70*  --    ALKPHOS 74  --   BILITOT 0.6  --   ALBUMIN 3.9 3.6    Cardiac Enzymes  Recent Labs Lab 04/22/16 0500 04/22/16 1021 04/22/16 1520  TROPONINI 0.43* 0.61* 0.55*    Glucose  Recent Labs Lab 04/23/16 0230 04/23/16 0330 04/23/16 0431 04/23/16 0531 04/23/16 0626 04/23/16 0733  GLUCAP 125* 135* 107* 113* 118* 150*    Imaging Dg Abd 1 View  Result Date: 04/22/2016 CLINICAL DATA:  Orogastric tube placement EXAM: ABDOMEN - 1 VIEW COMPARISON:  March 17, 2016 FINDINGS: Orogastric tube tip and side port are in the stomach. Bowel gas pattern is unremarkable. No bowel obstruction or free air. Calcifications in the pelvis are likely due to calcified uterine leiomyomas. There is a right femoral catheter with the tip overlying the upper sacrum, likely in the right common iliac vein region. There is an apparent rectal thermometer. IMPRESSION: Orogastric tube tip and side port in stomach. No bowel obstruction or free air evident. Calcified uterine leiomyomas. Right femoral catheter tip most likely in right common iliac vein given its location. Electronically Signed   By: Bretta BangWilliam  Woodruff III M.D.   On: 04/22/2016 08:28   STUDIES:  None  CULTURES: Blood 01/22>> Influenza PCR 01/22>>  ANTIBIOTICS: None  SIGNIFICANT EVENTS: 01/22>>Pt admitted to ICU due PEA arrest secondary to acute on chronic hypercapnic respiratory failure secondary to AECOPD requiring mechanical intubation with initiation of hypothermic protocol 1/23-remains on hypothermia protocol 33 degrees 1/24 rewarming phase    LINES/TUBES: ETT 01/22>> Right femoral CVL 01/22>>  ASSESSMENT / PLAN:  PULMONARY A: Acute on chronic hypercapnic respiratory failure secondary to AECOPD PEA arrest secondary to severe acidosis  Hx: Severe persistent asthma and Chronic home O2 at 4L P:   Full vent support Maintain O2 sats 90% to 94% Aggressive bronchodilator therapy IV steroids -hypothermic protocol for goal temp of 33  degree's Celsius for 24hrs CXR in am ABG's per hypothermic protocol   CARDIOVASCULAR A:  PEA arrest secondary to respiratory arrest with severe acidosis  Hx: Diastolic CHF and HTN P:  Maintain map greater than 80 mm during hypothermic protocol  Trend troponin's Cardiology consulted appreciate input Daily EKG's during hypothermic protocol APTT q8hrs  RENAL A:   Lactic acidosis likely secondary to Cardiac Arrest  Acute renal failure likely secondary to hypotension P:   Trend BMP's CVP monitoring q4hrs during hypothermic protocol  Sodium bicarb gtt for acidosis  Replace electrolytes as indicated Trend lactic acid Monitor UOP  GASTROINTESTINAL A:   No acute issues Hx: GERD  P:   Pepcid for PUD prophylaxis Keep NPO for now   HEMATOLOGIC A:   No acute  issues  P: Trend CBC's Subq heparin for VTE prophylaxis Monitor for s/sx of bleeding  APTT q8hrs per hypothermic protocol   INFECTIOUS A:   Mild leukocytosis  P:   Trend WBC's and monitor fever curve Trend PCT's and lactic acid Follow cultures If pt becomes febrile or develops worsening leukocytosis will start abx   ENDOCRINE A:   Hyperglycemia  P:   CBG's q1hr Will initiate insulin gtt if remains hyperglycemic  NEUROLOGIC A:   Acute encephalopathy post PEA arrest Hx: Anxiety and Polysubstance abuse P:   RASS goal: -5 during cooling phase of hypothermic protocol Assess RASS every 4 hours  Nimbex, Versed, and Fentanyl gtts to maintain RASS goal  WUA daily once hypothermic protocol completed Will need EEG and CT of Head once Hypothermic protocol completed  Will need Neurology consult  I have personally obtained a history, examined the patient, evaluated Pertinent laboratory and RadioGraphic/imaging results, and  formulated the assessment and plan   The Patient requires high complexity decision making for assessment and support, frequent evaluation and titration of therapies, application of advanced  monitoring technologies and extensive interpretation of multiple databases. Critical Care Time devoted to patient care services described in this note is 35 minutes.   Overall, patient is critically ill, prognosis is guarded.  Patient with Multiorgan failure and at high risk for cardiac arrest and death.    Lucie Leather, M.D.  [facility]  Medical Director [facility] Medical Director [facility] Cardio-Pulmonary Department

## 2016-04-24 ENCOUNTER — Inpatient Hospital Stay: Payer: Medicaid Other

## 2016-04-24 DIAGNOSIS — R0902 Hypoxemia: Secondary | ICD-10-CM

## 2016-04-24 DIAGNOSIS — R4182 Altered mental status, unspecified: Secondary | ICD-10-CM

## 2016-04-24 LAB — GLUCOSE, CAPILLARY
GLUCOSE-CAPILLARY: 102 mg/dL — AB (ref 65–99)
GLUCOSE-CAPILLARY: 107 mg/dL — AB (ref 65–99)
GLUCOSE-CAPILLARY: 93 mg/dL (ref 65–99)
Glucose-Capillary: 100 mg/dL — ABNORMAL HIGH (ref 65–99)
Glucose-Capillary: 162 mg/dL — ABNORMAL HIGH (ref 65–99)
Glucose-Capillary: 150 mg/dL — ABNORMAL HIGH (ref 65–99)

## 2016-04-24 LAB — BASIC METABOLIC PANEL WITH GFR
Anion gap: 7 (ref 5–15)
BUN: 22 mg/dL — ABNORMAL HIGH (ref 6–20)
CO2: 24 mmol/L (ref 22–32)
Calcium: 8.5 mg/dL — ABNORMAL LOW (ref 8.9–10.3)
Chloride: 106 mmol/L (ref 101–111)
Creatinine, Ser: 0.97 mg/dL (ref 0.44–1.00)
GFR calc Af Amer: >60 mL/min
GFR calc non Af Amer: >60 mL/min
Glucose, Bld: 138 mg/dL — ABNORMAL HIGH (ref 65–99)
Potassium: 4.8 mmol/L (ref 3.5–5.1)
Sodium: 137 mmol/L (ref 135–145)

## 2016-04-24 LAB — CBC
HEMATOCRIT: 34.7 % — AB (ref 35.0–47.0)
HEMOGLOBIN: 11.5 g/dL — AB (ref 12.0–16.0)
MCH: 32.5 pg (ref 26.0–34.0)
MCHC: 33.1 g/dL (ref 32.0–36.0)
MCV: 98.1 fL (ref 80.0–100.0)
Platelets: 237 10*3/uL (ref 150–440)
RBC: 3.54 MIL/uL — ABNORMAL LOW (ref 3.80–5.20)
RDW: 14.1 % (ref 11.5–14.5)
WBC: 21.4 10*3/uL — ABNORMAL HIGH (ref 3.6–11.0)

## 2016-04-24 LAB — ALBUMIN: ALBUMIN: 3.5 g/dL (ref 3.5–5.0)

## 2016-04-24 LAB — MAGNESIUM: Magnesium: 2.5 mg/dL — ABNORMAL HIGH (ref 1.7–2.4)

## 2016-04-24 LAB — PHOSPHORUS: Phosphorus: 3.6 mg/dL (ref 2.5–4.6)

## 2016-04-24 MED ORDER — AMIODARONE HCL IN DEXTROSE 360-4.14 MG/200ML-% IV SOLN
60.0000 mg/h | INTRAVENOUS | Status: DC
  Filled 2016-04-24: qty 200

## 2016-04-24 MED ORDER — ACETYLCYSTEINE 10 % IN SOLN: 4.0000 mL | Freq: Once | RESPIRATORY_TRACT | Status: DC

## 2016-04-24 MED ORDER — VITAL HIGH PROTEIN PO LIQD
1000.0000 mL | ORAL | Status: DC
  Administered 2016-04-24 – 2016-04-25 (×4)

## 2016-04-24 MED ORDER — DOCUSATE SODIUM 50 MG/5ML PO LIQD
100.0000 mg | Freq: Two times a day (BID) | ORAL | Status: DC
  Administered 2016-04-24 – 2016-04-27 (×4): 100 mg
  Filled 2016-04-24 (×4): qty 10

## 2016-04-24 MED ORDER — SENNOSIDES 8.8 MG/5ML PO SYRP
5.0000 mL | ORAL_SOLUTION | Freq: Two times a day (BID) | ORAL | Status: DC
  Administered 2016-04-24 – 2016-04-27 (×4): 5 mL
  Filled 2016-04-24 (×4): qty 5

## 2016-04-24 MED ORDER — ACETYLCYSTEINE 20 % IN SOLN
RESPIRATORY_TRACT | Status: AC
Start: 2016-04-24 — End: 2016-04-24
  Administered 2016-04-24: 4 mL via RESPIRATORY_TRACT
  Filled 2016-04-24: qty 4

## 2016-04-24 MED ORDER — AMIODARONE HCL IN DEXTROSE 360-4.14 MG/200ML-% IV SOLN: 30.0000 mg/h | INTRAVENOUS | Status: DC

## 2016-04-24 MED ORDER — AMIODARONE LOAD VIA INFUSION
150.0000 mg | Freq: Once | INTRAVENOUS | Status: DC
  Filled 2016-04-24: qty 83.34

## 2016-04-24 MED ORDER — ACETYLCYSTEINE 20 % IN SOLN
4.0000 mL | Freq: Once | RESPIRATORY_TRACT | Status: AC
  Administered 2016-04-24: 4 mL via RESPIRATORY_TRACT
  Filled 2016-04-24: qty 4

## 2016-04-24 NOTE — Progress Notes (Signed)
Patient Name: Julie Foley Date of Encounter: 04/24/2016  Primary Cardiologist: New to [facility] Problem List     Principal Problem:   Respiratory arrest [facility]) Active Problems:   COPD exacerbation ([place])   Cardiac arrest ([place])   Chronic diastolic CHF (congestive heart failure) (HCC)     Subjective   Status post rewarming protocol. For head CT today. Remains critically ill. Had mucus plug obstructing ET tube requiring bagging. Developed narrow complex tachycardia on 1/24-->amiodarone infusion. Lytes at goal.   Inpatient Medications    Scheduled Meds: . amiodarone  150 mg Intravenous Once  . artificial tears  1 application Both Eyes M3N  . ceFEPIme  1 g Intravenous Q8H  . chlorhexidine gluconate (MEDLINE KIT)  15 mL Mouth Rinse BID  . famotidine (PEPCID) IV  20 mg Intravenous Q12H  . fentaNYL (SUBLIMAZE) injection  100 mcg Intravenous Once  . heparin  5,000 Units Subcutaneous Q8H  . insulin aspart  0-9 Units Subcutaneous Q4H  . ipratropium-albuterol  3 mL Nebulization Q4H  . mouth rinse  15 mL Mouth Rinse 10 times per day  . methylPREDNISolone (SOLU-MEDROL) injection  40 mg Intravenous Q8H  . midazolam  2 mg Intravenous Once  . sodium chloride flush  10-40 mL Intracatheter Q12H   Continuous Infusions: . amiodarone     Followed by  . amiodarone    . fentaNYL 200 mcg/hr (04/24/16 0830)  . midazolam (VERSED) infusion 5 mg/hr (04/24/16 0830)  . norepinephrine (LEVOPHED) Adult infusion 8 mcg/min (04/24/16 0830)   PRN Meds: albuterol, fentaNYL, ipratropium-albuterol, midazolam, pneumococcal 23 valent vaccine, sodium chloride flush   Vital Signs    Vitals:   04/24/16 0645 04/24/16 0700 04/24/16 0800 04/24/16 0844  BP: (!) 77/49 (!) 98/58 124/85   Pulse:   (!) 118   Resp: (!) 25 (!) 25 (!) 21   Temp:  98.1 F (36.7 C) 98.2 F (36.8 C)   TempSrc:      SpO2:   100% 99%  Weight:      Height:        Intake/Output Summary (Last 24 hours) at 04/24/16  0938 Last data filed at 04/24/16 0830  Gross per 24 hour  Intake          1502.97 ml  Output              470 ml  Net          1032.97 ml   Filed Weights   04/29/2016 2119 04/22/16 0016  Weight: 187 lb 13.3 oz (85.2 kg) 189 lb 13.1 oz (86.1 kg)    Physical Exam    GEN: Critically ill appearing, in no acute distress. Intubated and sedated. HEENT: Grossly normal.  Neck: Supple, no JVD, carotid bruits, or masses. Cardiac: Tachycardic, no murmurs, rubs, or gallops. No clubbing, cyanosis, edema.  Radials/DP/PT 2+ and equal bilaterally.  Respiratory:  Decreased breath sounds bilaterally. Intubated.  GI: Soft, nontender, nondistended, decreased BS. MS: no deformity or atrophy. Skin: warm and dry, no rash. Neuro:  Intubated and sedated. Psych: Intubated and sedated.  Labs    CBC  Recent Labs  04/24/2016 2015 04/22/16 0624 04/24/16 0444  WBC 11.3* 21.3* 21.4*  NEUTROABS 6.7*  --   --   HGB 12.1 12.3 11.5*  HCT 37.2 37.2 34.7*  MCV 101.8* 99.3 98.1  PLT 279 234 361   Basic Metabolic Panel  Recent Labs  04/23/16 0951  04/23/16 2236 04/24/16 0444  NA 141  < >  139 137  K 3.3*  < > 4.6 4.8  CL 110  < > 108 106  CO2 23  < > 23 24  GLUCOSE 154*  < > 141* 138*  BUN 11  < > 18 22*  CREATININE 0.54  < > 0.98 0.97  CALCIUM 7.8*  < > 8.4* 8.5*  MG 1.7  --   --  2.5*  PHOS 2.3*  --   --  3.6  < > = values in this interval not displayed. Liver Function Tests  Recent Labs  04/03/2016 2015 04/22/16 1022 04/24/16 0444  AST 106*  --   --   ALT 70*  --   --   ALKPHOS 74  --   --   BILITOT 0.6  --   --   PROT 6.4*  --   --   ALBUMIN 3.9 3.6 3.5   No results for input(s): LIPASE, AMYLASE in the last 72 hours. Cardiac Enzymes  Recent Labs  04/22/16 0500 04/22/16 1021 04/22/16 1520  TROPONINI 0.43* 0.61* 0.55*   BNP Invalid input(s): POCBNP D-Dimer No results for input(s): DDIMER in the last 72 hours. Hemoglobin A1C No results for input(s): HGBA1C in the last 72  hours. Fasting Lipid Panel No results for input(s): CHOL, HDL, LDLCALC, TRIG, CHOLHDL, LDLDIRECT in the last 72 hours. Thyroid Function Tests No results for input(s): TSH, T4TOTAL, T3FREE, THYROIDAB in the last 72 hours.  Invalid input(s): FREET3  Telemetry    Narrow complex tachycardia, 120's bpm - Personally Reviewed  ECG    n/a - Personally Reviewed  Radiology    Dg Chest Port 1 View  Result Date: 04/24/2016 CLINICAL DATA:  Initial evaluation for endotracheal tube placement. EXAM: PORTABLE CHEST 1 VIEW COMPARISON:  Prior radiograph from 04/20/2016. FINDINGS: Patient remains intubated with the tip of an endotracheal tube positioned approximately 2.2 cm above the carina. Enteric tube courses in the the abdomen. Cardiac and mediastinal silhouettes are stable in size and contour, and remain within normal limits. Lungs mildly hypoinflated. No focal infiltrates. No pulmonary edema or pleural effusion. No pneumothorax. Osseous structures unchanged. IMPRESSION: 1. Endotracheal tube in appropriate position with tip located 2.2 cm above the carina. 2. No radiographic evidence for active cardiopulmonary disease. Electronically Signed   By: Jeannine Boga M.D.   On: 04/24/2016 05:38    Cardiac Studies   Echo 04/22/2016: Study Conclusions  - Limited study for evaluation of left ventricular systolic   function. - Left ventricle: The cavity size was normal. Wall thickness was   increased in a pattern of mild LVH. Systolic function was   severely reduced. The estimated ejection fraction was in the   range of 20% to 25%. - Right ventricle: Systolic function was reduced.  Echo 02/2016: Study Conclusions  - Left ventricle: The cavity size was normal. There was moderate   concentric hypertrophy. Systolic function was normal. The   estimated ejection fraction was in the range of 60% to 65%. Wall   motion was normal; there were no regional wall motion   abnormalities. Doppler  parameters are consistent with abnormal   left ventricular relaxation (grade 1 diastolic dysfunction). - Left atrium: The atrium was normal in size. - Right ventricle: Systolic function was normal. - Pulmonary arteries: Systolic pressure was mild to moderately   elevated. PA peak pressure: 45 mm Hg (S).  Impressions:  - Sinus tachycardia noted rate 120 bpm.  Patient Profile     61 y.o. female with history of chronic  respiratory failure on home oxygen at 2 L secondary to COPD (gold stage IV, FEV1 34% in 06/2009), chronic diastolic CHF, severe persistent asthma, morbid obesity, polysubstance abuse, anxiety with panic attacks, GERD, and frequent admissions/ED visits for SOB in the setting of substance abuse who presented to [facility] on 1/22 s/p respiratory and PEA arrest.   Assessment & Plan    1. Acute on chronic respiratory failure/respiratory arrest 2/2 AECOPD/asthma: -Intubated -Status post rewarming  -Monitor for any positive neurologic signs -CT head this morning pending -Neuro on board -Wean as able/per protocol per PCCM -Steroids/nebs per PCCM  2. PEA arrest: -Likely in the setting of electrolyte abnormalities  -Unlikely to be primary cardiac event given mild troponin elevation -No indication for urgent cardiac cath at this time -If she demonstrates meaningful neurologic recovery could pursue ischemic evaluation at that time -Echo with newly reduced EF of 25% -Start HF medications when able 2/2 hypotension requiring pressors  3. Acute systolic and acute on chronic diastolic CHF: -May need intermittent Lasix to prevent volume overload given multiple IV medications -As above  4. Narrow complex tachycardia: -Check 12-lead EKG -Started on amiodarone infusion by PCCM for rate and rhythm control  4. Hypokalemia: -Repleted  5. AKI: -Likely ATN 2/2 hypotension -Improved   Signed, Christell Faith, PA-C [facility] Pager: [phone] 04/24/2016, 9:38 AM

## 2016-04-24 NOTE — Progress Notes (Signed)
PULMONARY / CRITICAL CARE MEDICINE   Name: Julie BunSylvia L Gisler MRN: [id number] DOB: 09/23/1955    ADMISSION DATE:  September 20, 2016 CONSULTATION DATE:  0June 23, 2018  REFERRING MD:  Dr. Huel CoteQuigley   CHIEF COMPLAINT:  Cardiac Arrest   HISTORY OF PRESENT ILLNESS:   This is a 61 yo female with a PMH of HTN, GERD, Depression, Severe COPD (Gold stage IV, FEV1 34% 06/2009), Severe persistent asthma, Chronic home O2 at 4L, Diastolic CHF, Anxiety with panic attacks, and Polysubstance abuse.  [facility] 01/22 post PEA arrest.  Per ER notes the pt notified EMS due to c/o severe shortness of breath. According to pts daughter she was breathing ok the morning of 01/22.  Upon EMS arrival the pt was found to be in respiratory distress then went apneic became pulseless and then asystole ACLS protocol initiated with ROSC 10 minutes following CPR.  She was intubated in the field and transported to the ER.  Upon arrival to the ER she was breathing over the ventilator but not following commands, however she became bradycardic then went into PEA arrest ACLS protocol initiated she was administered 2 mg iv magnesium, 1 amp of bicarb, multiple doses of epi, and 1L fluid bolus with ROSC after 5 mins of CPR.  She coded again in the ER became hypotensive, bradycardic then went into a PEA arrest 1 amp epi, 1 amp sodium bicarb and chest compressions initiated with ROSC 2 minutes after interventions.  PCCM contacted to admit pt to ICU due PEA arrest secondary to acute on chronic hypercapnic respiratory failure secondary to AECOPD requiring mechanical intubation with initiation of hypothermic protocol.  REVIEW OF SYSTEMS:   Unable to assess pt intubated   SUBJECTIVE:  Unable to assess pt intubated  On rewarming phase Remains critically ill Plan for CT head Neurology consult Sedation as needed  VITAL SIGNS: BP (!) 77/49   Pulse (!) 127   Temp (!) 100.4 F (38 C) (Core (Comment))   Resp (!) 25   Ht 5\' 3"  (1.6 m)   Wt  189 lb 13.1 oz (86.1 kg)   SpO2 99%   BMI 33.62 kg/m   HEMODYNAMICS: CVP:  [8 mmHg-40 mmHg] 17 mmHg  VENTILATOR SETTINGS: Vent Mode: PRVC FiO2 (%):  [30 %-100 %] 100 % Set Rate:  [25 bmp] 25 bmp Vt Set:  [500 mL] 500 mL PEEP:  [5 cmH20] 5 cmH20  INTAKE / OUTPUT: I/O last 3 completed shifts: In: 2043.6 [I.V.:1333.6; NG/GT:60; IV Piggyback:650] Out: 950 [Urine:950]  PHYSICAL EXAMINATION: General: acutely ill appearing AA female Neuro:  Sedated, unresponsive, pupils unequal right 1 mm sluggish round, left 3 mm sluggish round HEENT: supple, mild JVD Cardiovascular: tachycardic, s1s2, no M/R/G Lungs: diminished very faint expiratory wheezes throughout, labored, tachypneic mechanically intubated  Abdomen: +BS x4, soft, obese, non tender, non distended  Musculoskeletal: normal bulk and tone, no edema Skin: intact no rashes or lesions GCS<8 LABS:  BMET  Recent Labs Lab 04/23/16 1808 04/23/16 2236 04/24/16 0444  NA 139 139 137  K 4.2 4.6 4.8  CL 107 108 106  CO2 23 23 24   Foley 14 18 22*  CREATININE 1.02* 0.98 0.97  GLUCOSE 135* 141* 138*    Electrolytes  Recent Labs Lab 04/23/16 0500 04/23/16 0951  04/23/16 1808 04/23/16 2236 04/24/16 0444  CALCIUM 7.2* 7.8*  < > 8.4* 8.4* 8.5*  MG 1.7 1.7  --   --   --  2.5*  PHOS 2.0* 2.3*  --   --   --  3.6  < > = values in this interval not displayed.  CBC  Recent Labs Lab 04/26/2016 2015 04/22/16 0624 04/24/16 0444  WBC 11.3* 21.3* 21.4*  HGB 12.1 12.3 11.5*  HCT 37.2 37.2 34.7*  PLT 279 234 237    Coag's  Recent Labs Lab 04/22/16 0624 04/22/16 1021  APTT 26 25    Sepsis Markers  Recent Labs Lab 04/17/2016 2050 04/22/16 0211 04/22/16 0213 04/22/16 0624 04/23/16 0500  LATICACIDVEN 4.3*  --  1.8  --   --   PROCALCITON  --  1.79  --  3.87 4.19    ABG  Recent Labs Lab 04/02/2016 2228 04/22/16 0219 04/22/16 1102  PHART 7.15* 7.41 7.47*  PCO2ART 79* 42 38  PO2ART 274* 188* 161*    Liver  Enzymes  Recent Labs Lab 04/24/2016 2015 04/22/16 1022  AST 106*  --   ALT 70*  --   ALKPHOS 74  --   BILITOT 0.6  --   ALBUMIN 3.9 3.6    Cardiac Enzymes  Recent Labs Lab 04/22/16 0500 04/22/16 1021 04/22/16 1520  TROPONINI 0.43* 0.61* 0.55*    Glucose  Recent Labs Lab 04/23/16 0733 04/23/16 1201 04/23/16 1654 04/23/16 2001 04/24/16 0355 04/24/16 0720  GLUCAP 150* 113* 118* 90 100* 102*    Imaging Dg Chest Port 1 View  Result Date: 04/24/2016 CLINICAL DATA:  Initial evaluation for endotracheal tube placement. EXAM: PORTABLE CHEST 1 VIEW COMPARISON:  Prior radiograph from 04/11/2016. FINDINGS: Patient remains intubated with the tip of an endotracheal tube positioned approximately 2.2 cm above the carina. Enteric tube courses in the the abdomen. Cardiac and mediastinal silhouettes are stable in size and contour, and remain within normal limits. Lungs mildly hypoinflated. No focal infiltrates. No pulmonary edema or pleural effusion. No pneumothorax. Osseous structures unchanged. IMPRESSION: 1. Endotracheal tube in appropriate position with tip located 2.2 cm above the carina. 2. No radiographic evidence for active cardiopulmonary disease. Electronically Signed   By: Rise Mu M.D.   On: 04/24/2016 05:38   STUDIES:  None  CULTURES: Blood 01/22>> Influenza PCR 01/22>>  ANTIBIOTICS: None  SIGNIFICANT EVENTS: 01/22>>Pt admitted to ICU due PEA arrest secondary to acute on chronic hypercapnic respiratory failure secondary to AECOPD requiring mechanical intubation with initiation of hypothermic protocol 1/23-remains on hypothermia protocol 33 degrees 1/24 rewarming phase    LINES/TUBES: ETT 01/22>> Right femoral CVL 01/22>>  ASSESSMENT / PLAN:  PULMONARY A: Acute on chronic hypercapnic respiratory failure secondary to AECOPD PEA arrest secondary to severe acidosis  Hx: Severe persistent asthma and Chronic home O2 at 4L P:   Full vent  support Maintain O2 sats 90% to 94% Aggressive bronchodilator therapy IV steroids -completed hypothermic protocol for goal temp of 33 degree's Celsius for 24hrs  CARDIOVASCULAR A:  PEA arrest secondary to respiratory arrest with severe acidosis  Hx: Diastolic CHF and HTN P:  Maintain map greater than 80 mm during hypothermic protocol  Trend troponin's Cardiology consulted appreciate input   RENAL A:   Lactic acidosis likely secondary to Cardiac Arrest  Acute renal failure likely secondary to hypotension P:   Trend BMP's CVP monitoring q4hrs during hypothermic protocol    GASTROINTESTINAL A:   No acute issues Hx: GERD  P:   Pepcid for PUD prophylaxis Keep NPO for now   HEMATOLOGIC A:   No acute issues  P: Trend CBC's Subq heparin for VTE prophylaxis Monitor for s/sx of bleeding   INFECTIOUS A:   Mild leukocytosis  P:  Trend WBC's and monitor fever curve Trend PCT's and lactic acid Follow cultures   ENDOCRINE A:   Hyperglycemia  P:   CBG's q1hr Will initiate insulin gtt if remains hyperglycemic  NEUROLOGIC A:   Acute encephalopathy post PEA arrest Hx: Anxiety and Polysubstance abuse P:   RASS goal: -2 Assess RASS every 4 hours  -Versed, and Fentanyl gtts to maintain RASS goal  CT head pending Will need Neurology consult  I have personally obtained a history, examined the patient, evaluated Pertinent laboratory and RadioGraphic/imaging results, and  formulated the assessment and plan   The Patient requires high complexity decision making for assessment and support, frequent evaluation and titration of therapies, application of advanced monitoring technologies and extensive interpretation of multiple databases. Critical Care Time devoted to patient care services described in this note is 35 minutes.   Overall, patient is critically ill, prognosis is guarded.  Patient with Multiorgan failure and at high risk for cardiac arrest and death.     Lucie Leather, M.D.  [facility]  Medical Director [facility] Medical Director [facility] Cardio-Pulmonary Department

## 2016-04-24 NOTE — Progress Notes (Signed)
Continuing to bag patient. According to Dr. Ardyth Manam CXR shows nothing acute. Continuing to attempt to dislodge any sort of mucous plug through use of mucomyst and additional duoneb.

## 2016-04-24 NOTE — Progress Notes (Signed)
Pharmacy note Amiodarone drug interactions: All medications reviewed. No interactions with amiodarone recommending therapy modification identified.

## 2016-04-24 NOTE — Progress Notes (Signed)
Remains unresponsive. Does not respond to pain. Opens eyes but only to mouth care. Opened eyes x 1 to no stimulation. Has not moved any extremities,even to pain. Pupils are equal but 2-823mm but very sluggish to react to light. Temperture max.37.8. Dr. Thad Rangereynolds in on consult. CT of head and EEG done today. CT showed no anoxic injury or acute infarct. No report on EEG.

## 2016-04-24 NOTE — Progress Notes (Signed)
Down to CT via bed.

## 2016-04-24 NOTE — Progress Notes (Signed)
During ventilator round pt had an increase in PIP. Sxn pt, repositioned bite block & ETT. No change in PIP but O2 saturation dropped to 40's. Elink doctor assisting with pt orders. Bagged pt & O2 sats increased quickly. Attempted multiple times to return pt to vent but not getting Vt over 200. Attempted PC mode on vent but unsuccessful. Continued to bag pt. Order given for 4cc of mucomyst via ETT while on left side & then on right side. Continued to bag pt then placed pt on vent & sxn pt. Sxn a moderate amt of secretions. Vt began to improve. Order given for 2 duoneb prn treatments. Treatments given inline. Bilateral breath sounds remain wheezy but did improve. O2 sats remain in mid to high 80's. Increased FiO2 to 100%. Vt back to normal. PIP remain elevated. Will continue to monitor pt closely.

## 2016-04-24 NOTE — Progress Notes (Signed)
eLink Physician-Brief Progress Note Patient Name: Julie BunSylvia L Foley DOB: [birth year] MRN: [id number]   Date of Service  04/24/2016  HPI/Events of Note  Pt had to be bagged on vent due to respiratory distress, with resistance to bagging noted. Poor volumes on vent with inability to ventilate and alarming.   eICU Interventions  Stat CXR ordered, may require change of ETT.      Intervention Category Major Interventions: Hypoxemia - evaluation and management  Shane Crutchradeep [name] 04/24/2016, 5:21 AM

## 2016-04-24 NOTE — Consult Note (Signed)
Reason for Consult:Altered mental status Referring Physician: Kasa  CC: Altered mental status  HPI: Julie Foley is an 61 y.o. female s/p arrest X 2, cumulative down time of about 20 minutes. Patient underwent ice protocol.  Now warmed since 3p yesterday and has not returned to baseline.  On initial attempts to stop sedation patient became tachycardic, breathed over the vent and bit down on her tube.  Sedation was restarted.  Consult called for information concerning prognosis.    Past Medical History:  Diagnosis Date  . Anxiety   . Asthma   . CHF (congestive heart failure) ([place])   . Chronic respiratory failure ([place])   . COPD (chronic obstructive pulmonary disease) ([place])   . Depression   . GERD (gastroesophageal reflux disease)   . HTN (hypertension)     Past Surgical History:  Procedure Laterality Date  . TUBAL LIGATION Bilateral     Family History  Problem Relation Age of Onset  . Cancer Mother   . Hypertension Mother   . Diabetes Mother   . Asthma Son     Social History:  reports that she has been smoking Cigarettes.  She has a 70.00 pack-year smoking history. She has never used smokeless tobacco. She reports that she drinks alcohol. She reports that she does not use drugs.  Allergies  Allergen Reactions  . Symbicort [Budesonide-Formoterol Fumarate] Swelling and Other (See Comments)    Reaction:  Tongue swelling   . Formoterol Fumarate Swelling and Other (See Comments)    Reaction:  Tongue swelling    Medications:  I have reviewed the patient's current medications. Prior to Admission:  Prescriptions Prior to Admission  Medication Sig Dispense Refill Last Dose  . albuterol (PROVENTIL HFA) 108 (90 Base) MCG/ACT inhaler Inhale 2 puffs into the lungs every 4 (four) hours as needed for wheezing or shortness of breath. 1 Inhaler 0 unknown at unknown  . albuterol (PROVENTIL) (2.5 MG/3ML) 0.083% nebulizer solution Take 3 mLs (2.5 mg total) by nebulization every 6 (six)  hours as needed for wheezing or shortness of breath. 75 mL 0 unknown at unknown  . ALPRAZolam (XANAX) 0.5 MG tablet Take 1 tablet (0.5 mg total) by mouth 3 (three) times daily as needed for sleep or anxiety. (Patient not taking: Reported on 03/20/2016) 30 tablet 0 Not Taking at unknow  . amLODipine (NORVASC) 5 MG tablet Take 1 tablet (5 mg total) by mouth daily. (Patient not taking: Reported on 03/20/2016) 30 tablet 10 Not Taking at unknown  . atenolol (TENORMIN) 50 MG tablet Take 1 tablet (50 mg total) by mouth daily. (Patient not taking: Reported on 03/20/2016) 30 tablet 10 Not Taking at unknown  . beclomethasone (QVAR) 80 MCG/ACT inhaler Inhale 2 puffs into the lungs 2 (two) times daily.   Past Week at Unknown time  . EPINEPHrine 0.3 mg/0.3 mL IJ SOAJ injection Inject 0.3 mLs (0.3 mg total) into the muscle once. 1 Device 0 prn at prn  . fluticasone (FLONASE) 50 MCG/ACT nasal spray Place 1 spray into both nostrils 2 (two) times daily.    Past Week at Unknown time  . Fluticasone-Salmeterol (ADVAIR) 500-50 MCG/DOSE AEPB Inhale 1 puff into the lungs 2 (two) times daily.   Past Week at Unknown time  . hydrochlorothiazide (HYDRODIURIL) 25 MG tablet Take 1 tablet (25 mg total) by mouth daily. (Patient not taking: Reported on 03/20/2016) 30 tablet 10 Not Taking at unknown  . ipratropium (ATROVENT) 0.02 % nebulizer solution Take 2.5 mLs (0.5 mg total)  by nebulization 4 (four) times daily. 75 mL 12 unknown at unknown  . loratadine (CLARITIN) 10 MG tablet Take 10 mg by mouth daily.   Past Week at Unknown time  . montelukast (SINGULAIR) 10 MG tablet Take 1 tablet (10 mg total) by mouth at bedtime. 30 tablet 10 unknown at unknown  . nicotine (NICODERM CQ - DOSED IN MG/24 HOURS) 21 mg/24hr patch Place 1 patch (21 mg total) onto the skin daily. 28 patch 0 unknown at unknown  . nitroGLYCERIN (NITROSTAT) 0.4 MG SL tablet Place 0.4 mg under the tongue every 5 (five) minutes as needed for chest pain.   prn at prn  .  predniSONE (DELTASONE) 10 MG tablet Take 4 tablets (40 mg total) by mouth daily with breakfast. Start with 40 mg daily taper  By 10 mg daily then stop 10 tablet 0   . roflumilast (DALIRESP) 500 MCG TABS tablet Take 1 tablet (500 mcg total) by mouth daily. 30 tablet 10 unknown at unknown  . tiotropium (SPIRIVA) 18 MCG inhalation capsule Place 18 mcg into inhaler and inhale daily.   Past Week at Unknown time   Scheduled: . artificial tears  1 application Both Eyes B6L  . ceFEPIme  1 g Intravenous Q8H  . chlorhexidine gluconate (MEDLINE KIT)  15 mL Mouth Rinse BID  . famotidine (PEPCID) IV  20 mg Intravenous Q12H  . fentaNYL (SUBLIMAZE) injection  100 mcg Intravenous Once  . heparin  5,000 Units Subcutaneous Q8H  . insulin aspart  0-9 Units Subcutaneous Q4H  . ipratropium-albuterol  3 mL Nebulization Q4H  . mouth rinse  15 mL Mouth Rinse 10 times per day  . methylPREDNISolone (SOLU-MEDROL) injection  40 mg Intravenous Q8H  . midazolam  2 mg Intravenous Once  . sodium chloride flush  10-40 mL Intracatheter Q12H    ROS: Unable to provide due to mental status  Physical Examination: Blood pressure 114/60, pulse (!) 118, temperature 98.2 F (36.8 C), resp. rate 11, height 5' 3"  (1.6 m), weight 86.1 kg (189 lb 13.1 oz), SpO2 99 %.  HEENT-  Normocephalic, no lesions, without obvious abnormality.  Normal external eye and conjunctiva.  Normal TM's bilaterally.  Normal auditory canals and external ears. Normal external nose, mucus membranes and septum.  Normal pharynx. Cardiovascular- S1, S2 normal, pulses palpable throughout   Lungs- chest clear, no wheezing, rales, normal symmetric air entry Abdomen- soft, non-tender; bowel sounds normal; no masses,  no organomegaly Extremities- mild LE edema Lymph-no adenopathy palpable Musculoskeletal-no joint tenderness, deformity or swelling Skin-warm and dry, no hyperpigmentation, vitiligo, or suspicious lesions  Neurological Examination Mental  Status: Patient does not respond to verbal stimuli.  With deep sternal rub eyes begin to open.  Does not follow commands.  No verbalizations noted.  Cranial Nerves: II: patient does not respond confrontation bilaterally, pupils right 2 mm, left 2 mm,and reactive bilaterally III,IV,VI: doll's response absent bilaterally.  V,VII: corneal reflex present bilaterally  VIII: patient does not respond to verbal stimuli IX,X: gag reflex reduced, XI: trapezius strength unable to test bilaterally XII: tongue strength unable to test Motor: Extremities flaccid throughout.  No spontaneous movement noted.  No purposeful movements noted. Sensory: Does not respond to noxious stimuli in any extremity. Deep Tendon Reflexes:  3-4+ throughout with sustained clonus at the ankles Plantars: mute bilaterally Cerebellar: Unable to perform   Laboratory Studies:   Basic Metabolic Panel:  Recent Labs Lab 04/22/16 0624  04/22/16 1811  04/23/16 0500 04/23/16 0951 04/23/16 1401 04/23/16 1808  04/23/16 2236 04/24/16 0444  NA  --   < > 140  < > 140 141 139 139 139 137  K  --   < > 3.4*  < > 3.2* 3.3* 3.7 4.2 4.6 4.8  CL  --   < > 106  < > 111 110 108 107 108 106  CO2  --   < > 23  < > 21* 23 23 23 23 24   GLUCOSE  --   < > 189*  < > 153* 154* 162* 135* 141* 138*  BUN  --   < > 15  < > 11 11 12 14 18  22*  CREATININE  --   < > 0.73  < > 0.49 0.54 0.62 1.02* 0.98 0.97  CALCIUM  --   < > 7.6*  < > 7.2* 7.8* 8.3* 8.4* 8.4* 8.5*  MG 2.5*  --  2.0  --  1.7 1.7  --   --   --  2.5*  PHOS 1.3*  --  2.9  --  2.0* 2.3*  --   --   --  3.6  < > = values in this interval not displayed.  Liver Function Tests:  Recent Labs Lab 04/28/2016 2015 04/22/16 1022 04/24/16 0444  AST 106*  --   --   ALT 70*  --   --   ALKPHOS 74  --   --   BILITOT 0.6  --   --   PROT 6.4*  --   --   ALBUMIN 3.9 3.6 3.5   No results for input(s): LIPASE, AMYLASE in the last 168 hours. No results for input(s): AMMONIA in the last 168  hours.  CBC:  Recent Labs Lab 04/26/2016 2015 04/22/16 0624 04/24/16 0444  WBC 11.3* 21.3* 21.4*  NEUTROABS 6.7*  --   --   HGB 12.1 12.3 11.5*  HCT 37.2 37.2 34.7*  MCV 101.8* 99.3 98.1  PLT 279 234 237    Cardiac Enzymes:  Recent Labs Lab 04/18/2016 2015 04/22/16 0500 04/22/16 1021 04/22/16 1520  TROPONINI 0.04* 0.43* 0.61* 0.55*    BNP: Invalid input(s): POCBNP  CBG:  Recent Labs Lab 04/23/16 1654 04/23/16 2001 04/24/16 0355 04/24/16 0720 04/24/16 1110  GLUCAP 118* 90 100* 102* 107*    Microbiology: Results for orders placed or performed during the hospital encounter of 04/15/2016  MRSA PCR Screening     Status: None   Collection Time: 04/22/16  1:00 AM  Result Value Ref Range Status   MRSA by PCR NEGATIVE NEGATIVE Final    Comment:        The GeneXpert MRSA Assay (FDA approved for NASAL specimens only), is one component of a comprehensive MRSA colonization surveillance program. It is not intended to diagnose MRSA infection nor to guide or monitor treatment for MRSA infections.   Culture, blood (routine x 2)     Status: None (Preliminary result)   Collection Time: 04/22/16  6:24 AM  Result Value Ref Range Status   Specimen Description BLOOD R AC  Final   Special Requests   Final    BOTTLES DRAWN AEROBIC AND ANAEROBIC AER 9ML ANA 4ML   Culture NO GROWTH 2 DAYS  Final   Report Status PENDING  Incomplete  Culture, blood (routine x 2)     Status: None (Preliminary result)   Collection Time: 04/22/16 10:22 AM  Result Value Ref Range Status   Specimen Description BLOOD R AC  Final   Special Requests BOTTLES DRAWN  AEROBIC AND ANAEROBIC 2ML  Final   Culture NO GROWTH 2 DAYS  Final   Report Status PENDING  Incomplete    Coagulation Studies: No results for input(s): LABPROT, INR in the last 72 hours.  Urinalysis: No results for input(s): COLORURINE, LABSPEC, PHURINE, GLUCOSEU, HGBUR, BILIRUBINUR, KETONESUR, PROTEINUR, UROBILINOGEN, NITRITE,  LEUKOCYTESUR in the last 168 hours.  Invalid input(s): APPERANCEUR  Lipid Panel:     Component Value Date/Time   CHOL 184 02/19/2013 0336   TRIG 55 03/19/2016 0451   TRIG 34 02/19/2013 0336   HDL 101 (H) 02/19/2013 0336   VLDL 7 02/19/2013 0336   LDLCALC 76 02/19/2013 0336    HgbA1C:  Lab Results  Component Value Date   HGBA1C 5.8 12/08/2015    Urine Drug Screen:     Component Value Date/Time   LABOPIA NONE DETECTED 04/22/2016 0255   COCAINSCRNUR NONE DETECTED 04/22/2016 0255   LABBENZ POSITIVE (A) 04/22/2016 0255   AMPHETMU NONE DETECTED 04/22/2016 0255   THCU NONE DETECTED 04/22/2016 0255   LABBARB NONE DETECTED 04/22/2016 0255    Alcohol Level: No results for input(s): ETH in the last 168 hours.  Other results: EKG: normal sinus rhythm at 84 bpm with T-wave inversions.  Imaging: Ct Head Wo Contrast  Result Date: 04/24/2016 CLINICAL DATA:  Anoxia.  Cardiac arrest. EXAM: CT HEAD WITHOUT CONTRAST TECHNIQUE: Contiguous axial images were obtained from the base of the skull through the vertex without intravenous contrast. COMPARISON:  None. FINDINGS: Brain: Ventricle size normal. Cerebral volume normal. Negative for acute infarct. Negative for hemorrhage or mass. Gray-white junction is well-defined and there is no evidence of cerebral edema. Vascular: No hyperdense vessel or unexpected calcification. Skull: Negative Sinuses/Orbits: Air-fluid level in the sphenoid sinus. Remaining sinuses clear. Chronic fracture right orbit. No orbital mass lesion. Other: None IMPRESSION: Negative CT head. No evidence of a anoxic brain injury at this time. Air-fluid level sphenoid sinus. Chronic fracture medial right orbit. Electronically Signed   By: Franchot Gallo M.D.   On: 04/24/2016 09:48   Dg Chest Port 1 View  Result Date: 04/24/2016 CLINICAL DATA:  Initial evaluation for endotracheal tube placement. EXAM: PORTABLE CHEST 1 VIEW COMPARISON:  Prior radiograph from 04/10/2016. FINDINGS:  Patient remains intubated with the tip of an endotracheal tube positioned approximately 2.2 cm above the carina. Enteric tube courses in the the abdomen. Cardiac and mediastinal silhouettes are stable in size and contour, and remain within normal limits. Lungs mildly hypoinflated. No focal infiltrates. No pulmonary edema or pleural effusion. No pneumothorax. Osseous structures unchanged. IMPRESSION: 1. Endotracheal tube in appropriate position with tip located 2.2 cm above the carina. 2. No radiographic evidence for active cardiopulmonary disease. Electronically Signed   By: Jeannine Boga M.D.   On: 04/24/2016 05:38     Assessment/Plan: 61 year old female s/p arrest X 2, cumulative down time of about 20 minutes. Patient underwent ice protocol.  Now warmed since 3p yesterday and has not returned to baseline.  Currently evaluated on Fentanyl.  Despite this does not fulfill criteria for brain death.  Patient does have sustained clonus though which is concerning for some possible anoxic injury.  Head CT reviewed and shows no acute changes.   Patient not yet 24 hours s/p warming.  Would give further time, at least 72 hours, before prognosis can be discussed.    Recommendations; 1.  EEG 2.  If no further improvement by tomorrow would consider MRI of the brain without contrast  Alexis Goodell, MD  Neurology [phone] 04/24/2016, 11:21 AM

## 2016-04-24 NOTE — Progress Notes (Signed)
Nutrition Follow-up  DOCUMENTATION CODES:   Obesity unspecified  INTERVENTION:  -Received verbal order from MD Kasa to start TF today. Recommend Vital High Protein beginning at 20 ml/hr, goal rate of 50 ml/hr providing 1200 kcals, 106 g of protein and 1008 mL of free water. Meets 100% estimated protein and calorie needs.    NUTRITION DIAGNOSIS:   Inadequate oral intake related to acute illness as evidenced by NPO status.  Being addressed via TF  GOAL:   Provide needs based on ASPEN/SCCM guidelines  MONITOR:   TF tolerance, Labs, Weight trends, I & O's  REASON FOR ASSESSMENT:   Ventilator    ASSESSMENT:   61 yo female admitted with acute on chronic respiratory failure secondary to AECOPD, PEA arrest secondary to severe acidosis. Pt with hx of severe COPD, HTN, GERD, depression, severe asthma, CHF, polysubstance abuse.  Hypothermia protocol (TTM) complete.  Pt remains on vent support.  Remains NPO  Labs: reviewed Meds: ss novolog, solumedrol, levophed, fentanyl, versed  Diet Order:   NPO  Skin:  Reviewed, no issues  Last BM:  no documented BM; bowel regimen to be started  Height:   Ht Readings from Last 1 Encounters:  04/22/16 5\' 3"  (1.6 m)    Weight:   Wt Readings from Last 1 Encounters:  04/22/16 189 lb 13.1 oz (86.1 kg)    Ideal Body Weight:  52.3 kg  BMI:  Body mass index is 33.62 kg/m.  Estimated Nutritional Needs:   Kcal:  1000-1200 kcals  Protein:  >/= 105 g  Fluid:  >/= 2L   EDUCATION NEEDS:   No education needs identified at this time  Romelle StarcherCate Dulcy Sida MS, RD, LDN [phone] Pager  [phone] Weekend/On-Call Pager

## 2016-04-24 NOTE — Progress Notes (Signed)
MEDICATION RELATED CONSULT NOTE   Pharmacy Consult for electrolytes, antibiotic dosing, and constipation prevention  Assessment: 60yoF found to have cardiac arrest secondary to AECOPD. Patient is now rewarmed from ICE protocol.    Plan: 1. Electrolytes: Labs within normal limits. Will continue to trend electrolytes as needed.  2. HCAP: Continue Cefepime 1g IV Q8h.   3. Constipation prevention: Patient remains on fentanyl drip and likely starting TF today. Will initiate senna/docusate VT bid.   Allergies  Allergen Reactions  . Symbicort [Budesonide-Formoterol Fumarate] Swelling and Other (See Comments)    Reaction:  Tongue swelling   . Formoterol Fumarate Swelling and Other (See Comments)    Reaction:  Tongue swelling    Patient Measurements: Height: 5\' 3"  (160 cm) Weight: 189 lb 13.1 oz (86.1 kg) IBW/kg (Calculated) : 52.4  Vital Signs: Temp: 99.3 F (37.4 C) (01/25 1300) Temp Source: Core (Comment) (01/25 0600) BP: 96/53 (01/25 1300) Pulse Rate: 105 (01/25 1300) Intake/Output from previous day: 01/24 0701 - 01/25 0700 In: 1593.4 [I.V.:1033.4; NG/GT:60; IV Piggyback:500] Out: 525 [Urine:375; Emesis/NG output:150] Intake/Output from this shift: Total I/O In: -  Out: 30 [Urine:30]  Labs:  Recent Labs  January 03, 2017 2015  04/22/16 0624 04/22/16 1021 04/22/16 1022  04/23/16 0500 04/23/16 0951  04/23/16 1808 04/23/16 2236 04/24/16 0444  WBC 11.3*  --  21.3*  --   --   --   --   --   --   --   --  21.4*  HGB 12.1  --  12.3  --   --   --   --   --   --   --   --  11.5*  HCT 37.2  --  37.2  --   --   --   --   --   --   --   --  34.7*  PLT 279  --  234  --   --   --   --   --   --   --   --  237  APTT  --   --  26 25  --   --   --   --   --   --   --   --   CREATININE 1.31*  < >  --   --  0.92  < > 0.49 0.54  < > 1.02* 0.98 0.97  MG  --   < > 2.5*  --   --   < > 1.7 1.7  --   --   --  2.5*  PHOS  --   < > 1.3*  --   --   < > 2.0* 2.3*  --   --   --  3.6  ALBUMIN 3.9   --   --   --  3.6  --   --   --   --   --   --  3.5  PROT 6.4*  --   --   --   --   --   --   --   --   --   --   --   AST 106*  --   --   --   --   --   --   --   --   --   --   --   ALT 70*  --   --   --   --   --   --   --   --   --   --   --  ALKPHOS 74  --   --   --   --   --   --   --   --   --   --   --   BILITOT 0.6  --   --   --   --   --   --   --   --   --   --   --   < > = values in this interval not displayed. Estimated Creatinine Clearance: 65.8 mL/min (by C-G formula based on SCr of 0.97 mg/dL).   Microbiology: Recent Results (from the past 720 hour(s))  MRSA PCR Screening     Status: None   Collection Time: 04/22/16  1:00 AM  Result Value Ref Range Status   MRSA by PCR NEGATIVE NEGATIVE Final    Comment:        The GeneXpert MRSA Assay (FDA approved for NASAL specimens only), is one component of a comprehensive MRSA colonization surveillance program. It is not intended to diagnose MRSA infection nor to guide or monitor treatment for MRSA infections.   Culture, blood (routine x 2)     Status: None (Preliminary result)   Collection Time: 04/22/16  6:24 AM  Result Value Ref Range Status   Specimen Description BLOOD R AC  Final   Special Requests   Final    BOTTLES DRAWN AEROBIC AND ANAEROBIC AER ANA   Culture NO GROWTH 2 DAYS  Final   Report Status PENDING  Incomplete  Culture, blood (routine x 2)     Status: None (Preliminary result)   Collection Time: 04/22/16 10:22 AM  Result Value Ref Range Status   Specimen Description BLOOD R AC  Final   Special Requests BOTTLES DRAWN AEROBIC AND ANAEROBIC  Final   Culture NO GROWTH 2 DAYS  Final   Report Status PENDING  Incomplete    Medical History: Past Medical History:  Diagnosis Date  . Anxiety   . Asthma   . CHF (congestive heart failure) (HCC)   . Chronic respiratory failure (HCC)   . COPD (chronic obstructive pulmonary disease) (HCC)   . Depression   . GERD (gastroesophageal reflux disease)    . HTN (hypertension)    1/23 BCx: NGTD 1/23 MRSA PCR: negative  Valentina Gu, PharmD 04/24/2016,1:45 PM

## 2016-04-24 NOTE — Progress Notes (Signed)
eLink Physician-Brief Progress Note Patient Name: Julie Foley DOB: October 26, 1955 MRN: [id number]   Date of Service  04/24/2016  HPI/Events of Note   Peak pressures reached 50 with mean of 25; stat CXR unremarkable. Vt-100-150.    eICU Interventions  Pt placed in bilat decub positions with instillation of mucomyst. Then after 5 min performed suction in bilat decub position. Moderate secretions suctioned from left lung. Tidal volumes increased to 500cc.   -Also noted afib with rate of 150- will start amio.   Will continue to monitor.       Intervention Category Intermediate Interventions: Respiratory distress - evaluation and management  Shane Crutchradeep Aemon Koeller 04/24/2016, 5:43 AM

## 2016-04-24 NOTE — Progress Notes (Signed)
After patient suddenly dropped tidal volumes, RT repositioned ETT to see if this would improve. Then patient quickly dropped O2 sats to 40's. Bagging at this time. Elink doctor on camera at this time.

## 2016-04-24 NOTE — Progress Notes (Signed)
Continuing to bag pt. Elink doctor remains camera'd in. Attempting to dislodge any mucous plugs. Stat CXR in process.

## 2016-04-25 ENCOUNTER — Inpatient Hospital Stay: Payer: Medicaid Other

## 2016-04-25 DIAGNOSIS — R0902 Hypoxemia: Secondary | ICD-10-CM

## 2016-04-25 LAB — BASIC METABOLIC PANEL
Anion gap: 5 (ref 5–15)
BUN: 27 mg/dL — ABNORMAL HIGH (ref 6–20)
CO2: 25 mmol/L (ref 22–32)
CREATININE: 0.82 mg/dL (ref 0.44–1.00)
Calcium: 8.4 mg/dL — ABNORMAL LOW (ref 8.9–10.3)
Chloride: 106 mmol/L (ref 101–111)
GFR calc Af Amer: >60 mL/min (ref >60)
GLUCOSE: 149 mg/dL — AB (ref 65–99)
POTASSIUM: 5.1 mmol/L (ref 3.5–5.1)
Sodium: 136 mmol/L (ref 135–145)

## 2016-04-25 LAB — GLUCOSE, CAPILLARY
GLUCOSE-CAPILLARY: 108 mg/dL — AB (ref 65–99)
GLUCOSE-CAPILLARY: 113 mg/dL — AB (ref 65–99)
Glucose-Capillary: 113 mg/dL — ABNORMAL HIGH (ref 65–99)
Glucose-Capillary: 110 mg/dL — ABNORMAL HIGH (ref 65–99)

## 2016-04-25 LAB — CBC
HEMATOCRIT: 30.6 % — AB (ref 35.0–47.0)
Hemoglobin: 10.2 g/dL — ABNORMAL LOW (ref 12.0–16.0)
MCH: 32.4 pg (ref 26.0–34.0)
MCHC: 33.5 g/dL (ref 32.0–36.0)
MCV: 97 fL (ref 80.0–100.0)
PLATELETS: 229 10*3/uL (ref 150–440)
RBC: 3.15 MIL/uL — ABNORMAL LOW (ref 3.80–5.20)
RDW: 14.2 % (ref 11.5–14.5)
WBC: 18.9 10*3/uL — ABNORMAL HIGH (ref 3.6–11.0)

## 2016-04-25 MED ORDER — ACETAMINOPHEN 650 MG RE SUPP
650.0000 mg | Freq: Four times a day (QID) | RECTAL | Status: DC | PRN
  Administered 2016-04-25 – 2016-04-28 (×2): 650 mg via RECTAL
  Filled 2016-04-25 (×2): qty 1

## 2016-04-25 MED ORDER — FAMOTIDINE 20 MG PO TABS
20.0000 mg | ORAL_TABLET | Freq: Two times a day (BID) | ORAL | Status: DC
  Administered 2016-04-26 – 2016-04-28 (×5): 20 mg via ORAL
  Filled 2016-04-25 (×5): qty 1

## 2016-04-25 MED ORDER — SODIUM CHLORIDE 0.9 % IV SOLN
INTRAVENOUS | Status: DC
  Administered 2016-04-25 – 2016-04-27 (×5): via INTRAVENOUS
  Filled 2016-04-25 (×5): qty 200

## 2016-04-25 NOTE — Progress Notes (Signed)
Subjective: Patient remains on sedation.  Remains unresponsive.    Objective: Current vital signs: BP 132/73   Pulse 91   Temp 98.8 F (37.1 C) (Axillary)   Resp 12   Ht _0  (1.6 m)   Wt 86.1 kg (189 lb 13.1 oz)   SpO2 99%   BMI 33.62 kg/m  Vital signs in last 24 hours: Temp:  [97 F (36.1 C)-99.9 F (37.7 C)] 98.8 F (37.1 C) (01/26 0800) Pulse Rate:  [73-111] 91 (01/26 0900) Resp:  [12-25] 12 (01/26 0900) BP: (93-132)/(47-73) 132/73 (01/26 0900) SpO2:  [98 %-100 %] 99 % (01/26 0900) FiO2 (%):  [35 %-50 %] 35 % (01/26 0800)  Intake/Output from previous day: 01/25 0701 - 01/26 0700 In: 1714.3 [I.V.:1162.1; NG/GT:302.2; IV Piggyback:250] Out: 1530 [Urine:1530] Intake/Output this shift: Total I/O In: 126 [I.V.:96; NG/GT:30] Out: -  Nutritional status:    Neurologic Exam: Mental Status: Patient does not respond to verbal stimuli.  Does not follow commands.  No verbalizations noted.  Cranial Nerves: II: patient does not respond confrontation bilaterally, pupils right 2 mm, left 2 mm,and reactive bilaterally III,IV,VI: doll's response absent bilaterally.  V,VII: corneal reflex present bilaterally  VIII: patient does not respond to verbal stimuli IX,X: gag reflex reduced, XI: trapezius strength unable to test bilaterally XII: tongue strength unable to test Motor: Extremities flaccid throughout.  No spontaneous movement noted.  No purposeful movements noted. Sensory: Does not respond to noxious stimuli in any extremity. Deep Tendon Reflexes:  3-4+ throughout with sustained clonus at the ankles  Lab Results: Basic Metabolic Panel:  Recent Labs Lab 04/22/16 0624  04/22/16 1811  04/23/16 0500 04/23/16 0951 04/23/16 1401 04/23/16 1808 04/23/16 2236 04/24/16 0444 04/25/16 0538  NA  --   < > 140  < > 140 141 139 139 139 137 136  K  --   < > 3.4*  < > 3.2* 3.3* 3.7 4.2 4.6 4.8 5.1  CL  --   < > 106  < > 111 110 108 107 108 106 106  CO2  --   < > 23  < > 21*  _1 GLUCOSE  --   < > 189*  < > 153* 154* 162* 135* 141* 138* 149*  BUN  --   < > 15  < > _2 22* 27*  CREATININE  --   < > 0.73  < > 0.49 0.54 0.62 1.02* 0.98 0.97 0.82  CALCIUM  --   < > 7.6*  < > 7.2* 7.8* 8.3* 8.4* 8.4* 8.5* 8.4*  MG 2.5*  --  2.0  --  1.7 1.7  --   --   --  2.5*  --   PHOS 1.3*  --  2.9  --  2.0* 2.3*  --   --   --  3.6  --   < > = values in this interval not displayed.  Liver Function Tests:  Recent Labs Lab 04/19/2016 2015 04/22/16 1022 04/24/16 0444  AST 106*  --   --   ALT 70*  --   --   ALKPHOS 74  --   --   BILITOT 0.6  --   --   PROT 6.4*  --   --   ALBUMIN 3.9 3.6 3.5   No results for input(s): LIPASE, AMYLASE in the last 168 hours. No results for input(s): AMMONIA in the last 168 hours.  CBC:  Recent Labs  Lab 04/07/2016 2015 04/22/16 0624 04/24/16 0444 04/25/16 0538  WBC 11.3* 21.3* 21.4* 18.9*  NEUTROABS 6.7*  --   --   --   HGB 12.1 12.3 11.5* 10.2*  HCT 37.2 37.2 34.7* 30.6*  MCV 101.8* 99.3 98.1 97.0  PLT 279 234 237 229    Cardiac Enzymes:  Recent Labs Lab 04/23/2016 2015 04/22/16 0500 04/22/16 1021 04/22/16 1520  TROPONINI 0.04* 0.43* 0.61* 0.55*    Lipid Panel: No results for input(s): CHOL, TRIG, HDL, CHOLHDL, VLDL, LDLCALC in the last 168 hours.  CBG:  Recent Labs Lab 04/24/16 1110 04/24/16 1619 04/24/16 1950 04/25/16 0034 04/25/16 0430  GLUCAP 107* 162* 150* 108* 113*    Microbiology: Results for orders placed or performed during the hospital encounter of 04/25/2016  MRSA PCR Screening     Status: None   Collection Time: 04/22/16  1:00 AM  Result Value Ref Range Status   MRSA by PCR NEGATIVE NEGATIVE Final    Comment:        The GeneXpert MRSA Assay (FDA approved for NASAL specimens only), is one component of a comprehensive MRSA colonization surveillance program. It is not intended to diagnose MRSA infection nor to guide or monitor treatment for MRSA infections.   Culture,  blood (routine x 2)     Status: None (Preliminary result)   Collection Time: 04/22/16  6:24 AM  Result Value Ref Range Status   Specimen Description BLOOD R AC  Final   Special Requests   Final    BOTTLES DRAWN AEROBIC AND ANAEROBIC AER 9ML ANA 4ML   Culture NO GROWTH 3 DAYS  Final   Report Status PENDING  Incomplete  Culture, blood (routine x 2)     Status: None (Preliminary result)   Collection Time: 04/22/16 10:22 AM  Result Value Ref Range Status   Specimen Description BLOOD R AC  Final   Special Requests BOTTLES DRAWN AEROBIC AND ANAEROBIC 2ML  Final   Culture NO GROWTH 3 DAYS  Final   Report Status PENDING  Incomplete    Coagulation Studies: No results for input(s): LABPROT, INR in the last 72 hours.  Imaging: Ct Head Wo Contrast  Result Date: 04/24/2016 CLINICAL DATA:  Anoxia.  Cardiac arrest. EXAM: CT HEAD WITHOUT CONTRAST TECHNIQUE: Contiguous axial images were obtained from the base of the skull through the vertex without intravenous contrast. COMPARISON:  None. FINDINGS: Brain: Ventricle size normal. Cerebral volume normal. Negative for acute infarct. Negative for hemorrhage or mass. Gray-white junction is well-defined and there is no evidence of cerebral edema. Vascular: No hyperdense vessel or unexpected calcification. Skull: Negative Sinuses/Orbits: Air-fluid level in the sphenoid sinus. Remaining sinuses clear. Chronic fracture right orbit. No orbital mass lesion. Other: None IMPRESSION: Negative CT head. No evidence of a anoxic brain injury at this time. Air-fluid level sphenoid sinus. Chronic fracture medial right orbit. Electronically Signed   By: Franchot Gallo M.D.   On: 04/24/2016 09:48   Dg Chest Port 1 View  Result Date: 04/24/2016 CLINICAL DATA:  Initial evaluation for endotracheal tube placement. EXAM: PORTABLE CHEST 1 VIEW COMPARISON:  Prior radiograph from 04/19/2016. FINDINGS: Patient remains intubated with the tip of an endotracheal tube positioned  approximately 2.2 cm above the carina. Enteric tube courses in the the abdomen. Cardiac and mediastinal silhouettes are stable in size and contour, and remain within normal limits. Lungs mildly hypoinflated. No focal infiltrates. No pulmonary edema or pleural effusion. No pneumothorax. Osseous structures unchanged. IMPRESSION: 1. Endotracheal tube in appropriate  position with tip located 2.2 cm above the carina. 2. No radiographic evidence for active cardiopulmonary disease. Electronically Signed   By: Jeannine Boga M.D.   On: 04/24/2016 05:38    Medications:  I have reviewed the patient's current medications. Scheduled: . artificial tears  1 application Both Eyes E8Y  . ceFEPIme  1 g Intravenous Q8H  . chlorhexidine gluconate (MEDLINE KIT)  15 mL Mouth Rinse BID  . sennosides  5 mL Per Tube BID   And  . docusate  100 mg Per Tube BID  . famotidine (PEPCID) IV  20 mg Intravenous Q12H  . feeding supplement (VITAL HIGH PROTEIN)  1,000 mL Per Tube Q24H  . fentaNYL (SUBLIMAZE) injection  100 mcg Intravenous Once  . heparin  5,000 Units Subcutaneous Q8H  . insulin aspart  0-9 Units Subcutaneous Q4H  . ipratropium-albuterol  3 mL Nebulization Q4H  . mouth rinse  15 mL Mouth Rinse 10 times per day  . methylPREDNISolone (SOLU-MEDROL) injection  40 mg Intravenous Q8H  . midazolam  2 mg Intravenous Once  . sodium chloride flush  10-40 mL Intracatheter Q12H    Assessment/Plan: Patient remains unresponsive.  On sedation.  EEG showed no evidence of subclinical seizure activity.  CT unremarkable.  Further imaging indicated.    Recommendations: 1.  MRI of the brain without contrast 2.  Will continue to follow with you   LOS: 4 days   Alexis Goodell, MD Neurology [phone] 04/25/2016  10:02 AM

## 2016-04-25 NOTE — Progress Notes (Addendum)
Tube feeds placed on hold d/t regurgitation from air port on OG tube. No LBM noted in EPIC. Will make Dr Belia HemanKasa aware.

## 2016-04-25 NOTE — Progress Notes (Signed)
Pt was able to wean completely off sedative medications until around 1600 when pt was being prepared for transfer to MRI. Pt became tachycardiac in the 130s with a hypertensive crisis. MRI resched for 1100 am tomorrow.

## 2016-04-25 NOTE — Progress Notes (Signed)
CONCERNING: IV to Oral Route Change Policy  RECOMMENDATION: This patient is receiving famotidine by the intravenous route.  Based on criteria approved by the Pharmacy and Therapeutics Committee, the intravenous medication(s) is/are being converted to the equivalent oral dose form(s).   DESCRIPTION: These criteria include:  The patient is eating (either orally or via tube) and/or has been taking other orally administered medications for a least 24 hours  The patient has no evidence of active gastrointestinal bleeding or impaired GI absorption (gastrectomy, short bowel, patient on TNA or NPO).  If you have questions about this conversion, please contact the Pharmacy Department  []   [phone]( [phone] )  [facility] [x]   [phone]( [phone] )  [facility] []   [phone]( [phone] )  [facility] []   [phone] )  [facility] []   [phone]( [phone] )  [facility]   Simpson,Michael L, [facility]RPH 04/25/2016 7:30 PM

## 2016-04-25 NOTE — Progress Notes (Signed)
PULMONARY / CRITICAL CARE MEDICINE   Name: Julie Foley MRN: [id number] DOB: 16-Apr-1955    ADMISSION DATE:  2016/05/12 CONSULTATION DATE:  2016/05/12  REFERRING MD:  Dr. Huel Cote   CHIEF COMPLAINT:  Cardiac Arrest   HISTORY OF PRESENT ILLNESS:   This is a 61 yo female with a PMH of HTN, GERD, Depression, Severe COPD (Gold stage IV, FEV1 34% 06/2009), Severe persistent asthma, Chronic home O2 at 4L, Diastolic CHF, Anxiety with panic attacks, and Polysubstance abuse.  She presented to [facility] ER 01/22 post PEA arrest.  Per ER notes the pt notified EMS due to c/o severe shortness of breath. According to pts daughter she was breathing ok the morning of 01/22.  Upon EMS arrival the pt was found to be in respiratory distress then went apneic became pulseless and then asystole ACLS protocol initiated with ROSC 10 minutes following CPR.  She was intubated in the field and transported to the ER.  Upon arrival to the ER she was breathing over the ventilator but not following commands, however she became bradycardic then went into PEA arrest ACLS protocol initiated she was administered 2 mg iv magnesium, 1 amp of bicarb, multiple doses of epi, and 1L fluid bolus with ROSC after 5 mins of CPR.  She coded again in the ER became hypotensive, bradycardic then went into a PEA arrest 1 amp epi, 1 amp sodium bicarb and chest compressions initiated with ROSC 2 minutes after interventions.  PCCM contacted to admit pt to ICU due PEA arrest secondary to acute on chronic hypercapnic respiratory failure secondary to AECOPD requiring mechanical intubation with initiation of hypothermic protocol.  REVIEW OF SYSTEMS:   Unable to assess pt intubated   SUBJECTIVE:  Unable to assess pt intubated  On rewarming phase Remains critically ill  CT head 1/25 no acute changes MRI EEG pending Neurology consult appreciated Sedation as needed  VITAL SIGNS: BP (!) 110/56   Pulse 73   Temp 97 F (36.1 C) (Core (Comment))   Resp  (!) 25   Ht 5\' 3"  (1.6 m)   Wt 189 lb 13.1 oz (86.1 kg)   SpO2 99%   BMI 33.62 kg/m   HEMODYNAMICS: CVP:  [10 mmHg-21 mmHg] 14 mmHg  VENTILATOR SETTINGS: Vent Mode: PRVC FiO2 (%):  [35 %-50 %] 35 % Set Rate:  [25 bmp] 25 bmp Vt Set:  [500 mL] 500 mL PEEP:  [5 cmH20] 5 cmH20  INTAKE / OUTPUT: I/O last 3 completed shifts: In: 2851.8 [I.V.:2099.7; NG/GT:302.2; IV Piggyback:450] Out: 1830 [Urine:1680; Emesis/NG output:150]  PHYSICAL EXAMINATION: General: acutely ill appearing AA female Neuro:  Sedated, unresponsive, pupils unequal right 1 mm sluggish round, left 3 mm sluggish round HEENT: supple, mild JVD Cardiovascular: tachycardic, s1s2, no M/R/G Lungs: diminished very faint expiratory wheezes throughout, labored, tachypneic mechanically intubated  Abdomen: +BS x4, soft, obese, non tender, non distended  Musculoskeletal: normal bulk and tone, no edema Skin: intact no rashes or lesions GCS<8T LABS:  BMET  Recent Labs Lab 04/23/16 2236 04/24/16 0444 04/25/16 0538  NA 139 137 136  K 4.6 4.8 5.1  CL 108 106 106  CO2 23 24 25   BUN 18 22* 27*  CREATININE 0.98 0.97 0.82  GLUCOSE 141* 138* 149*    Electrolytes  Recent Labs Lab 04/23/16 0500 04/23/16 0951  04/23/16 2236 04/24/16 0444 04/25/16 0538  CALCIUM 7.2* 7.8*  < > 8.4* 8.5* 8.4*  MG 1.7 1.7  --   --  2.5*  --  PHOS 2.0* 2.3*  --   --  3.6  --   < > = values in this interval not displayed.  CBC  Recent Labs Lab 04/22/16 0624 04/24/16 0444 04/25/16 0538  WBC 21.3* 21.4* 18.9*  HGB 12.3 11.5* 10.2*  HCT 37.2 34.7* 30.6*  PLT 234 237 229    Coag's  Recent Labs Lab 04/22/16 0624 04/22/16 1021  APTT 26 25    Sepsis Markers  Recent Labs Lab May 11, 2016 2050 04/22/16 0211 04/22/16 0213 04/22/16 0624 04/23/16 0500  LATICACIDVEN 4.3*  --  1.8  --   --   PROCALCITON  --  1.79  --  3.87 4.19    ABG  Recent Labs Lab May 11, 2016 2228 04/22/16 0219 04/22/16 1102  PHART 7.15* 7.41 7.47*   PCO2ART 79* 42 38  PO2ART 274* 188* 161*    Liver Enzymes  Recent Labs Lab May 11, 2016 2015 04/22/16 1022 04/24/16 0444  AST 106*  --   --   ALT 70*  --   --   ALKPHOS 74  --   --   BILITOT 0.6  --   --   ALBUMIN 3.9 3.6 3.5    Cardiac Enzymes  Recent Labs Lab 04/22/16 0500 04/22/16 1021 04/22/16 1520  TROPONINI 0.43* 0.61* 0.55*    Glucose  Recent Labs Lab 04/24/16 0720 04/24/16 1110 04/24/16 1619 04/24/16 1950 04/25/16 0034 04/25/16 0430  GLUCAP 102* 107* 162* 150* 108* 113*    Imaging Ct Head Wo Contrast  Result Date: 04/24/2016 CLINICAL DATA:  Anoxia.  Cardiac arrest. EXAM: CT HEAD WITHOUT CONTRAST TECHNIQUE: Contiguous axial images were obtained from the base of the skull through the vertex without intravenous contrast. COMPARISON:  None. FINDINGS: Brain: Ventricle size normal. Cerebral volume normal. Negative for acute infarct. Negative for hemorrhage or mass. Gray-white junction is well-defined and there is no evidence of cerebral edema. Vascular: No hyperdense vessel or unexpected calcification. Skull: Negative Sinuses/Orbits: Air-fluid level in the sphenoid sinus. Remaining sinuses clear. Chronic fracture right orbit. No orbital mass lesion. Other: None IMPRESSION: Negative CT head. No evidence of a anoxic brain injury at this time. Air-fluid level sphenoid sinus. Chronic fracture medial right orbit. Electronically Signed   By: Marlan Palauharles  Clark M.D.   On: 04/24/2016 09:48   STUDIES:  None  CULTURES: Blood 01/22>>no growth Influenza PCR 01/22>>NEG MRSA neg ANTIBIOTICS: None  SIGNIFICANT EVENTS: 01/22>>Pt admitted to ICU due PEA arrest secondary to acute on chronic hypercapnic respiratory failure secondary to AECOPD requiring mechanical intubation with initiation of hypothermic protocol 1/23-remains on hypothermia protocol 33 degrees 1/24 rewarming phase 1/25 signs of anoxic brain injtury    LINES/TUBES: ETT 01/22>> Right femoral CVL  01/22>>  ASSESSMENT / PLAN:  PULMONARY A: Acute on chronic hypercapnic respiratory failure secondary to AECOPD PEA arrest secondary to severe acidosis  Hx: Severe persistent asthma and Chronic home O2 at 4L P:   Full vent support Maintain O2 sats 90% to 94% Aggressive bronchodilator therapy IV steroids -completed hypothermic protocol for goal temp of 33 degree's Celsius for 24hrs  CARDIOVASCULAR A:  PEA arrest secondary to respiratory arrest with severe acidosis  Hx: Diastolic CHF and HTN P:  Maintain map greater than 65  Cardiology consulted appreciate input   RENAL A:   Lactic acidosis likely secondary to Cardiac Arrest  Acute renal failure likely secondary to hypotension Foley catheter Follow UOP   GASTROINTESTINAL A:   No acute issues Hx: GERD  P:   Pepcid for PUD prophylaxis Start TF's  HEMATOLOGIC A:   No acute issues  P: Trend CBC's Subq heparin for VTE prophylaxis Monitor for s/sx of bleeding   INFECTIOUS A:   Mild leukocytosis  P:   Trend WBC's and monitor fever curve Trend PCT's and lactic acid Follow cultures   ENDOCRINE A:   Hyperglycemia  P:   CBG's q1hr Will initiate insulin gtt if remains hyperglycemic  NEUROLOGIC A:   Acute encephalopathy post PEA arrest Hx: Anxiety and Polysubstance abuse P:   RASS goal: -2 Assess RASS every 4 hours  -Versed, and Fentanyl gtts to maintain RASS goal -1 CT head no acute changes  Neurology consulted-MRI pending  I have personally obtained a history, examined the patient, evaluated Pertinent laboratory and RadioGraphic/imaging results, and  formulated the assessment and plan   The Patient requires high complexity decision making for assessment and support, frequent evaluation and titration of therapies, application of advanced monitoring technologies and extensive interpretation of multiple databases. Critical Care Time devoted to patient care services described in this note is 38 minutes.    Overall, patient is critically ill, prognosis is guarded.  Patient with Multiorgan failure and at high risk for cardiac arrest and death.   Will get palliative care consult, recommend DNR status  Veleka Djordjevic Santiago Glad, M.D.  [facility]  Medical Director [facility] Medical Director [facility] Cardio-Pulmonary Department

## 2016-04-25 NOTE — Progress Notes (Signed)
MEDICATION RELATED CONSULT NOTE   Pharmacy Consult for electrolytes, antibiotic dosing, and constipation prevention  Assessment: 60yoF found to have cardiac arrest secondary to AECOPD. Patient is now rewarmed from ICE protocol.    Plan: 1. Electrolytes: Labs within normal limits. Will continue to trend electrolytes as needed.  2. HCAP: Continue Cefepime 1g IV Q8h.   3. Constipation prevention: Patient receiving tube feeds. Will continue senna/docusate VT BID. Will advance to 2 tabs BID if no bowel movement by 1/27.   Allergies  Allergen Reactions  . Symbicort [Budesonide-Formoterol Fumarate] Swelling and Other (See Comments)    Reaction:  Tongue swelling   . Formoterol Fumarate Swelling and Other (See Comments)    Reaction:  Tongue swelling    Patient Measurements: Height: 5\' 3"  (160 cm) Weight: 189 lb 13.1 oz (86.1 kg) IBW/kg (Calculated) : 52.4  Vital Signs: Temp: 97.6 F (36.4 C) (01/26 1600) Temp Source: Axillary (01/26 1600) BP: 148/82 (01/26 1700) Pulse Rate: 87 (01/26 1700) Intake/Output from previous day: 01/25 0701 - 01/26 0700 In: 1714.3 [I.V.:1162.1; NG/GT:302.2; IV Piggyback:250] Out: 1530 [Urine:1530] Intake/Output from this shift: No intake/output data recorded.  Labs:  Recent Labs  04/23/16 0500 04/23/16 0951  04/23/16 2236 04/24/16 0444 04/25/16 0538  WBC  --   --   --   --  21.4* 18.9*  HGB  --   --   --   --  11.5* 10.2*  HCT  --   --   --   --  34.7* 30.6*  PLT  --   --   --   --  237 229  CREATININE 0.49 0.54  < > 0.98 0.97 0.82  MG 1.7 1.7  --   --  2.5*  --   PHOS 2.0* 2.3*  --   --  3.6  --   ALBUMIN  --   --   --   --  3.5  --   < > = values in this interval not displayed. Estimated Creatinine Clearance: 77.8 mL/min (by C-G formula based on SCr of 0.82 mg/dL).   Microbiology: Recent Results (from the past 720 hour(s))  MRSA PCR Screening     Status: None   Collection Time: 04/22/16  1:00 AM  Result Value Ref Range Status   MRSA by PCR NEGATIVE NEGATIVE Final    Comment:        The GeneXpert MRSA Assay (FDA approved for NASAL specimens only), is one component of a comprehensive MRSA colonization surveillance program. It is not intended to diagnose MRSA infection nor to guide or monitor treatment for MRSA infections.   Culture, blood (routine x 2)     Status: None (Preliminary result)   Collection Time: 04/22/16  6:24 AM  Result Value Ref Range Status   Specimen Description BLOOD R AC  Final   Special Requests   Final    BOTTLES DRAWN AEROBIC AND ANAEROBIC AER 9ML ANA 4ML   Culture NO GROWTH 3 DAYS  Final   Report Status PENDING  Incomplete  Culture, blood (routine x 2)     Status: None (Preliminary result)   Collection Time: 04/22/16 10:22 AM  Result Value Ref Range Status   Specimen Description BLOOD R AC  Final   Special Requests BOTTLES DRAWN AEROBIC AND ANAEROBIC 2ML  Final   Culture NO GROWTH 3 DAYS  Final   Report Status PENDING  Incomplete    Medical History: Past Medical History:  Diagnosis Date  . Anxiety   .  Asthma   . CHF (congestive heart failure) (HCC)   . Chronic respiratory failure (HCC)   . COPD (chronic obstructive pulmonary disease) (HCC)   . Depression   . GERD (gastroesophageal reflux disease)   . HTN (hypertension)    1/23 BCx: NGTD 1/23 MRSA PCR: negative  Dynasia Kercheval L, PharmD 04/25/2016,7:26 PM

## 2016-04-26 ENCOUNTER — Inpatient Hospital Stay: Payer: Medicaid Other

## 2016-04-26 LAB — GLUCOSE, CAPILLARY
GLUCOSE-CAPILLARY: 121 mg/dL — AB (ref 65–99)
GLUCOSE-CAPILLARY: 124 mg/dL — AB (ref 65–99)
GLUCOSE-CAPILLARY: 124 mg/dL — AB (ref 65–99)
GLUCOSE-CAPILLARY: 124 mg/dL — AB (ref 65–99)
GLUCOSE-CAPILLARY: 127 mg/dL — AB (ref 65–99)
Glucose-Capillary: 110 mg/dL — ABNORMAL HIGH (ref 65–99)
Glucose-Capillary: 123 mg/dL — ABNORMAL HIGH (ref 65–99)
Glucose-Capillary: 124 mg/dL — ABNORMAL HIGH (ref 65–99)
Glucose-Capillary: 152 mg/dL — ABNORMAL HIGH (ref 65–99)

## 2016-04-26 MED ORDER — SCOPOLAMINE 1 MG/3DAYS TD PT72
1.0000 | MEDICATED_PATCH | TRANSDERMAL | Status: DC
  Administered 2016-04-27: 1.5 mg via TRANSDERMAL
  Filled 2016-04-26: qty 1

## 2016-04-26 MED ORDER — VECURONIUM BROMIDE 10 MG IV SOLR
10.0000 mg | Freq: Once | INTRAVENOUS | Status: DC
  Filled 2016-04-26: qty 10

## 2016-04-26 MED ORDER — FENTANYL CITRATE (PF) 100 MCG/2ML IJ SOLN
100.0000 ug | Freq: Once | INTRAMUSCULAR | Status: AC
  Administered 2016-04-26: 100 ug via INTRAVENOUS
  Filled 2016-04-26: qty 2

## 2016-04-26 MED ORDER — MIDAZOLAM HCL 2 MG/2ML IJ SOLN
2.0000 mg | Freq: Once | INTRAMUSCULAR | Status: AC
  Administered 2016-04-26: 2 mg via INTRAVENOUS
  Filled 2016-04-26: qty 2

## 2016-04-26 NOTE — Progress Notes (Signed)
Report given to Dutch QuintHiral Patel, RN receiving pt on night shift.

## 2016-04-26 NOTE — Progress Notes (Signed)
MRI results back, showing global anoxic brain injury, notified Dr. Belia HemanKasa.  Per Dr. Belia HemanKasa he will speak with Dr. Thad Rangereynolds regarding MRI results.  Pt has had 75 ml of dark brown/burgundy from OG tube suction, notified Dr. Belia HemanKasa.  No new orders at this time per Dr. Belia HemanKasa for GI consult.  Per Kasa, continue to hold tube feedings.

## 2016-04-26 NOTE — Progress Notes (Signed)
Pt remains on vent, no wean attempt today.  MRI head showing global anoxic brain injury.  Pt sedated with fentanyl and versed.  No purposeful movements, pt will open eyes, cough, and bite ETT to stimulation.  Vital signs stable, afebrile.  Adequate urine output from foley.

## 2016-04-26 NOTE — Progress Notes (Signed)
Subjective: Patient unchanged.  Remains unresponsive.  Objective: Current vital signs: BP 122/72   Pulse 70   Temp 98.5 F (36.9 C) (Axillary)   Resp (!) 25   Ht 5' 3"  (1.6 m)   Wt 86.1 kg (189 lb 13.1 oz)   SpO2 98%   BMI 33.62 kg/m  Vital signs in last 24 hours: Temp:  [98.3 F (36.8 C)-99 F (37.2 C)] 98.5 F (36.9 C) (01/27 1638) Pulse Rate:  [68-99] 70 (01/27 1600) Resp:  [22-25] 25 (01/27 1600) BP: (96-146)/(55-82) 122/72 (01/27 1600) SpO2:  [94 %-99 %] 98 % (01/27 1600) FiO2 (%):  [25 %-35 %] 35 % (01/27 1600)  Intake/Output from previous day: 01/26 0701 - 01/27 0700 In: 956 [I.V.:641; NG/GT:165; IV Piggyback:150] Out: 1050 [Urine:800; Emesis/NG output:250] Intake/Output this shift: Total I/O In: 140 [I.V.:140] Out: 200 [Urine:200] Nutritional status:    Neurologic Exam: Mental Status: Patient does not respond to verbal stimuli. Does not follow commands. No verbalizations noted.  Cranial Nerves: II: patient does not respond confrontation bilaterally, pupils right 43m, left 221mand reactivebilaterally III,IV,VI: doll's response present bilaterally.  V,VII: corneal reflex present bilaterally VIII: patient does not respond to verbal stimuli IX,X: gag reflex reduced, XI: trapezius strength unable to test bilaterally XII: tongue strength unable to test Motor: Extremities flaccid throughout. No spontaneous movement noted. No purposeful movements noted. Sensory: Does not respond to noxious stimuli in any extremity. Deep Tendon Reflexes:  3-4+ throughout with sustained clonus at the ankles  Lab Results: Basic Metabolic Panel:  Recent Labs Lab 04/22/16 0624  04/22/16 1811  04/23/16 0500 04/23/16 0951 04/23/16 1401 04/23/16 1808 04/23/16 2236 04/24/16 0444 04/25/16 0538  NA  --   < > 140  < > 140 141 139 139 139 137 136  K  --   < > 3.4*  < > 3.2* 3.3* 3.7 4.2 4.6 4.8 5.1  CL  --   < > 106  < > 111 110 108 107 108 106 106  CO2  --   < > 23  <  > 21* 23 23 23 23 24 25   GLUCOSE  --   < > 189*  < > 153* 154* 162* 135* 141* 138* 149*  BUN  --   < > 15  < > 11 11 12 14 18  22* 27*  CREATININE  --   < > 0.73  < > 0.49 0.54 0.62 1.02* 0.98 0.97 0.82  CALCIUM  --   < > 7.6*  < > 7.2* 7.8* 8.3* 8.4* 8.4* 8.5* 8.4*  MG 2.5*  --  2.0  --  1.7 1.7  --   --   --  2.5*  --   PHOS 1.3*  --  2.9  --  2.0* 2.3*  --   --   --  3.6  --   < > = values in this interval not displayed.  Liver Function Tests:  Recent Labs Lab 04/04/2016 2015 04/22/16 1022 04/24/16 0444  AST 106*  --   --   ALT 70*  --   --   ALKPHOS 74  --   --   BILITOT 0.6  --   --   PROT 6.4*  --   --   ALBUMIN 3.9 3.6 3.5   No results for input(s): LIPASE, AMYLASE in the last 168 hours. No results for input(s): AMMONIA in the last 168 hours.  CBC:  Recent Labs Lab 04/07/2016 2015 04/22/16 [id number] 0444 04/25/16 056222  WBC 11.3* 21.3* 21.4* 18.9*  NEUTROABS 6.7*  --   --   --   HGB 12.1 12.3 11.5* 10.2*  HCT 37.2 37.2 34.7* 30.6*  MCV 101.8* 99.3 98.1 97.0  PLT 279 234 237 229    Cardiac Enzymes:  Recent Labs Lab 04/19/2016 2015 04/22/16 0500 04/22/16 1021 04/22/16 1520  TROPONINI 0.04* 0.43* 0.61* 0.55*    Lipid Panel: No results for input(s): CHOL, TRIG, HDL, CHOLHDL, VLDL, LDLCALC in the last 168 hours.  CBG:  Recent Labs Lab 04/26/16 0025 04/26/16 0411 04/26/16 0744 04/26/16 1230 04/26/16 1656  GLUCAP 110* 152* 123* 124* 54*    Microbiology: Results for orders placed or performed during the hospital encounter of 04/28/2016  MRSA PCR Screening     Status: None   Collection Time: 04/22/16  1:00 AM  Result Value Ref Range Status   MRSA by PCR NEGATIVE NEGATIVE Final    Comment:        The GeneXpert MRSA Assay (FDA approved for NASAL specimens only), is one component of a comprehensive MRSA colonization surveillance program. It is not intended to diagnose MRSA infection nor to guide or monitor treatment for MRSA infections.    Culture, blood (routine x 2)     Status: None (Preliminary result)   Collection Time: 04/22/16  6:24 AM  Result Value Ref Range Status   Specimen Description BLOOD R AC  Final   Special Requests   Final    BOTTLES DRAWN AEROBIC AND ANAEROBIC AER 9ML ANA 4ML   Culture NO GROWTH 4 DAYS  Final   Report Status PENDING  Incomplete  Culture, blood (routine x 2)     Status: None (Preliminary result)   Collection Time: 04/22/16 10:22 AM  Result Value Ref Range Status   Specimen Description BLOOD R AC  Final   Special Requests BOTTLES DRAWN AEROBIC AND ANAEROBIC 2ML  Final   Culture NO GROWTH 4 DAYS  Final   Report Status PENDING  Incomplete    Coagulation Studies: No results for input(s): LABPROT, INR in the last 72 hours.  Imaging: Mr Brain Wo Contrast  Result Date: 04/26/2016 CLINICAL DATA:  Anoxia EXAM: MRI HEAD WITHOUT CONTRAST TECHNIQUE: Multiplanar, multiecho pulse sequences of the brain and surrounding structures were obtained without intravenous contrast. COMPARISON:  Head CT from 2 days ago. FINDINGS: Brain: There is patchy low-grade restricted diffusion in the cortex of the bilateral cerebral convexities, roughly following the watershed territories. The caudate nuclei are symmetrically mildly diffusion hyperintense. These areas show edematous change on FLAIR imaging. No hemorrhage, hydrocephalus, or shift. Vascular: Preserved flow voids. Skull and upper cervical spine: No marrow lesion. 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 Sinuses/Orbits: Remote blowout fracture of the medial wall right orbit. Filling defect in the right sphenoid sinus is noncalcified by CT, most consistent with proteinaceous mucous retention cyst. There is maxillary fluid levels and nasopharyngeal opacification in the setting of intubation. Bilateral mastoid effusion. IMPRESSION: 1. Global anoxic injury pattern with patchy involvement of the watershed cortex and caudate nuclei. 2. Sinus and mastoid fluid  levels in the setting of intubation. Electronically Signed   By: Monte Fantasia M.D.   On: 04/26/2016 13:15    Medications:  I have reviewed the patient's current medications. Scheduled: . artificial tears  1 application Both Eyes F6E  . ceFEPIme  1 g Intravenous Q8H  . chlorhexidine gluconate (MEDLINE KIT)  15 mL Mouth Rinse BID  . sennosides  5 mL Per Tube BID  And  . docusate  100 mg Per Tube BID  . famotidine  20 mg Oral BID  . fentaNYL (SUBLIMAZE) injection  100 mcg Intravenous Once  . heparin  5,000 Units Subcutaneous Q8H  . insulin aspart  0-9 Units Subcutaneous Q4H  . ipratropium-albuterol  3 mL Nebulization Q4H  . mouth rinse  15 mL Mouth Rinse 10 times per day  . methylPREDNISolone (SOLU-MEDROL) injection  40 mg Intravenous Q8H  . midazolam  2 mg Intravenous Once  . sodium chloride flush  10-40 mL Intracatheter Q12H  . vecuronium  10 mg Intravenous Once    Assessment/Plan: Patient remains unresponsive but continues to not fulfill criteria for brain death.  MRI of the brain reviewed and shows evidence of diffuse anoxic brain injury.  Prognosis is poor for functional recovery.     LOS: 5 days   Alexis Goodell, MD Neurology [phone] 04/26/2016  5:54 PM

## 2016-04-26 NOTE — Progress Notes (Signed)
Pt to go for MRI around 1100 this morning, per Dr. Belia HemanKasa do not perform wake-up assessment as this time.

## 2016-04-26 NOTE — Progress Notes (Signed)
PULMONARY / CRITICAL CARE MEDICINE   Name: Julie Foley MRN: [id number] DOB: 1955/08/22    ADMISSION DATE:  04/12/2016 CONSULTATION DATE:  04/09/2016  REFERRING MD:  Dr. Huel Cote   CHIEF COMPLAINT:  Cardiac Arrest   HISTORY OF PRESENT ILLNESS:   This is a 61 yo female with a PMH of HTN, GERD, Depression, Severe COPD (Gold stage IV, FEV1 34% 06/2009), Severe persistent asthma, Chronic home O2 at 4L, Diastolic CHF, Anxiety with panic attacks, and Polysubstance abuse.  She presented to [facility] ER 01/22 post PEA arrest.  Per ER notes the pt notified EMS due to c/o severe shortness of breath. According to pts daughter she was breathing ok the morning of 01/22.  Upon EMS arrival the pt was found to be in respiratory distress then went apneic became pulseless and then asystole ACLS protocol initiated with ROSC 10 minutes following CPR.  She was intubated in the field and transported to the ER.  Upon arrival to the ER she was breathing over the ventilator but not following commands, however she became bradycardic then went into PEA arrest ACLS protocol initiated she was administered 2 mg iv magnesium, 1 amp of bicarb, multiple doses of epi, and 1L fluid bolus with ROSC after 5 mins of CPR.  She coded again in the ER became hypotensive, bradycardic then went into a PEA arrest 1 amp epi, 1 amp sodium bicarb and chest compressions initiated with ROSC 2 minutes after interventions.  PCCM contacted to admit pt to ICU due PEA arrest secondary to acute on chronic hypercapnic respiratory failure secondary to AECOPD requiring mechanical intubation with initiation of hypothermic protocol.  REVIEW OF SYSTEMS:   Unable to assess pt intubated   SUBJECTIVE:  Unable to assess pt intubated  On rewarming phase Remains critically ill CT head 1/25 no acute changes MRI  pending Neurology consult appreciated Sedation as needed  VITAL SIGNS: BP 114/60   Pulse 75   Temp 98.8 F (37.1 C) (Axillary)   Resp (!) 25   Ht  5\' 3"  (1.6 m)   Wt 189 lb 13.1 oz (86.1 kg)   SpO2 99%   BMI 33.62 kg/m   HEMODYNAMICS:    VENTILATOR SETTINGS: Vent Mode: PRVC FiO2 (%):  [35 %] 35 % Set Rate:  [25 bmp] 25 bmp Vt Set:  [500 mL] 500 mL PEEP:  [5 cmH20] 5 cmH20 Plateau Pressure:  [22 cmH20] 22 cmH20  INTAKE / OUTPUT: I/O last 3 completed shifts: In: 2580.2 [I.V.:1803.1; NG/GT:427.2; IV Piggyback:350] Out: 2300 [Urine:2050; Emesis/NG output:250]  PHYSICAL EXAMINATION: General: acutely ill appearing AA female Neuro:  Sedated, unresponsive, pupils unequal right 1 mm sluggish round, left 3 mm sluggish round HEENT: supple, mild JVD Cardiovascular: tachycardic, s1s2, no M/R/G Lungs: diminished very faint expiratory wheezes throughout, labored, tachypneic mechanically intubated  Abdomen: +BS x4, soft, obese, non tender, non distended  Musculoskeletal: normal bulk and tone, no edema Skin: intact no rashes or lesions GCS<8T LABS:  BMET  Recent Labs Lab 04/23/16 2236 04/24/16 0444 04/25/16 0538  NA 139 137 136  K 4.6 4.8 5.1  CL 108 106 106  CO2 23 24 25   BUN 18 22* 27*  CREATININE 0.98 0.97 0.82  GLUCOSE 141* 138* 149*    Electrolytes  Recent Labs Lab 04/23/16 0500 04/23/16 0951  04/23/16 2236 04/24/16 0444 04/25/16 0538  CALCIUM 7.2* 7.8*  < > 8.4* 8.5* 8.4*  MG 1.7 1.7  --   --  2.5*  --   PHOS 2.0*  2.3*  --   --  3.6  --   < > = values in this interval not displayed.  CBC  Recent Labs Lab 04/22/16 0624 04/24/16 0444 04/25/16 0538  WBC 21.3* 21.4* 18.9*  HGB 12.3 11.5* 10.2*  HCT 37.2 34.7* 30.6*  PLT 234 237 229    Coag's  Recent Labs Lab 04/22/16 0624 04/22/16 1021  APTT 26 25    Sepsis Markers  Recent Labs Lab May 07, 2016 2050 04/22/16 0211 04/22/16 0213 04/22/16 0624 04/23/16 0500  LATICACIDVEN 4.3*  --  1.8  --   --   PROCALCITON  --  1.79  --  3.87 4.19    ABG  Recent Labs Lab 2016-05-07 2228 04/22/16 0219 04/22/16 1102  PHART 7.15* 7.41 7.47*   PCO2ART 79* 42 38  PO2ART 274* 188* 161*    Liver Enzymes  Recent Labs Lab 05-07-16 2015 04/22/16 1022 04/24/16 0444  AST 106*  --   --   ALT 70*  --   --   ALKPHOS 74  --   --   BILITOT 0.6  --   --   ALBUMIN 3.9 3.6 3.5    Cardiac Enzymes  Recent Labs Lab 04/22/16 0500 04/22/16 1021 04/22/16 1520  TROPONINI 0.43* 0.61* 0.55*    Glucose  Recent Labs Lab 04/25/16 0430 04/25/16 1126 04/25/16 1739 04/26/16 0025 04/26/16 0411 04/26/16 0744  GLUCAP 113* 113* 110* 110* 152* 123*    Imaging No results found. STUDIES:  None  CULTURES: Blood 01/22>>no growth Influenza PCR 01/22>>NEG MRSA neg ANTIBIOTICS: None  SIGNIFICANT EVENTS: 01/22>>Pt admitted to ICU due PEA arrest secondary to acute on chronic hypercapnic respiratory failure secondary to AECOPD requiring mechanical intubation with initiation of hypothermic protocol 1/23-remains on hypothermia protocol 33 degrees 1/24 rewarming phase 1/25 signs of anoxic brain injtury 1/26 signs of anoxic brain injury, withdraws to pain 1/27 MRI brain pending   LINES/TUBES: ETT 01/22>> Right femoral CVL 01/22>>  ASSESSMENT / PLAN:  PULMONARY A: Acute on chronic hypercapnic respiratory failure secondary to AECOPD PEA arrest secondary to severe acidosis  Hx: Severe persistent asthma and Chronic home O2 at 4L P:   Full vent support Maintain O2 sats 90% to 94% Aggressive bronchodilator therapy IV steroids -completed hypothermic protocol for goal temp of 33 degree's Celsius for 24hrs  CARDIOVASCULAR A:  PEA arrest secondary to respiratory arrest with severe acidosis  Hx: Diastolic CHF and HTN P:  Maintain map greater than 65  Cardiology consulted appreciate input   RENAL A:   Lactic acidosis likely secondary to Cardiac Arrest  Acute renal failure likely secondary to hypotension Foley catheter Follow UOP   GASTROINTESTINAL A:   No acute issues Hx: GERD  P:   Pepcid for PUD prophylaxis Tube  feeds held+ileus  HEMATOLOGIC A:   No acute issues  P: Trend CBC's Subq heparin for VTE prophylaxis Monitor for s/sx of bleeding   INFECTIOUS A:   Mild leukocytosis  P:   Trend WBC's and monitor fever curve Follow cultures   ENDOCRINE A:   Hyperglycemia  P:   CBG's q1hr Will initiate insulin gtt if remains hyperglycemic  NEUROLOGIC A:   Acute encephalopathy post PEA arrest Hx: Anxiety and Polysubstance abuse P:   RASS goal: -2 Assess RASS every 4 hours  -Versed, and Fentanyl gtts to maintain RASS goal -1 CT head no acute changes  Neurology consulted-MRI pending  I have personally obtained a history, examined the patient, evaluated Pertinent laboratory and RadioGraph[employer]/imaging results, and  formulated the assessment and plan   The Patient requires high complexity decision making for assessment and support, frequent evaluation and titration of therapies, application of advanced monitoring technologies and extensive interpretation of multiple databases. Critical Care Time devoted to patient care services described in this note is 34 minutes.   Overall, patient is critically ill, prognosis is guarded.  Patient with Multiorgan failure and at high risk for cardiac arrest and death.   Will get palliative care consult, recommend DNR status  Thanvi Blincoe Santiago Gladavid Korinna Tat, M.D.  [facility]  Medical Director [facility] Medical Director [facility]

## 2016-04-26 NOTE — Therapy (Signed)
Patient placed on transport vent at ordered settings, transported to MRI, placed on designated vent in MRI at ordered settings. Tolerated procedure well. Copious amount of purulent foul-smelling green secretions draining from nares. Patient suction nasopharyngeally. Back in room, placed on Servoi vent  At ordered settings. Suctioned for small amount of tan secretions. Oral airway repositioned and secured. Tolerated transports and procedure well without incident.

## 2016-04-27 LAB — BASIC METABOLIC PANEL
ANION GAP: 6 (ref 5–15)
BUN: 44 mg/dL — ABNORMAL HIGH (ref 6–20)
CALCIUM: 8.6 mg/dL — AB (ref 8.9–10.3)
CHLORIDE: 104 mmol/L (ref 101–111)
CO2: 26 mmol/L (ref 22–32)
CREATININE: 0.88 mg/dL (ref 0.44–1.00)
GFR calc non Af Amer: >60 mL/min (ref >60)
Glucose, Bld: 128 mg/dL — ABNORMAL HIGH (ref 65–99)
Potassium: 4.8 mmol/L (ref 3.5–5.1)
SODIUM: 136 mmol/L (ref 135–145)

## 2016-04-27 LAB — GLUCOSE, CAPILLARY
GLUCOSE-CAPILLARY: 116 mg/dL — AB (ref 65–99)
GLUCOSE-CAPILLARY: 117 mg/dL — AB (ref 65–99)
GLUCOSE-CAPILLARY: 135 mg/dL — AB (ref 65–99)
GLUCOSE-CAPILLARY: 145 mg/dL — AB (ref 65–99)
Glucose-Capillary: 128 mg/dL — ABNORMAL HIGH (ref 65–99)
Glucose-Capillary: 135 mg/dL — ABNORMAL HIGH (ref 65–99)

## 2016-04-27 LAB — CBC
HCT: 31.2 % — ABNORMAL LOW (ref 35.0–47.0)
HEMOGLOBIN: 10.5 g/dL — AB (ref 12.0–16.0)
MCH: 32.3 pg (ref 26.0–34.0)
MCHC: 33.7 g/dL (ref 32.0–36.0)
MCV: 95.7 fL (ref 80.0–100.0)
Platelets: 235 10*3/uL (ref 150–440)
RBC: 3.26 MIL/uL — ABNORMAL LOW (ref 3.80–5.20)
RDW: 13.9 % (ref 11.5–14.5)
WBC: 15.5 10*3/uL — AB (ref 3.6–11.0)

## 2016-04-27 LAB — CULTURE, BLOOD (ROUTINE X 2)
CULTURE: NO GROWTH
Culture: NO GROWTH

## 2016-04-27 LAB — MAGNESIUM: MAGNESIUM: 2.7 mg/dL — AB (ref 1.7–2.4)

## 2016-04-27 LAB — PHOSPHORUS: PHOSPHORUS: 5 mg/dL — AB (ref 2.5–4.6)

## 2016-04-27 MED ORDER — SENNOSIDES 8.8 MG/5ML PO SYRP
10.0000 mL | ORAL_SOLUTION | Freq: Two times a day (BID) | ORAL | Status: DC
  Administered 2016-04-27 – 2016-04-30 (×6): 10 mL
  Filled 2016-04-27 (×7): qty 10

## 2016-04-27 MED ORDER — DOCUSATE SODIUM 50 MG/5ML PO LIQD
200.0000 mg | Freq: Two times a day (BID) | ORAL | Status: DC
  Administered 2016-04-27 – 2016-04-30 (×6): 200 mg
  Filled 2016-04-27 (×6): qty 20

## 2016-04-27 MED ORDER — METOPROLOL TARTRATE 5 MG/5ML IV SOLN
5.0000 mg | Freq: Four times a day (QID) | INTRAVENOUS | Status: DC
  Administered 2016-04-27: 5 mg via INTRAVENOUS
  Filled 2016-04-27: qty 5

## 2016-04-27 MED ORDER — HYDRALAZINE HCL 20 MG/ML IJ SOLN
10.0000 mg | INTRAMUSCULAR | Status: DC | PRN
  Administered 2016-04-28: 10 mg via INTRAVENOUS
  Filled 2016-04-27: qty 1

## 2016-04-27 NOTE — Progress Notes (Signed)
eLink Physician-Brief Progress Note Patient Name: Maryruth BunSylvia L Jury DOB: 06-20-1955 MRN: [id number]   Date of Service  04/27/2016  HPI/Events of Note  Hypertension on no meds/ adequately sedated  eICU Interventions  Add IV lopressor and prn IV hydralazine     Intervention Category Major Interventions: Hypertension - evaluation and management  Sandrea HughsMichael Maleia Weems 04/27/2016, 6:23 PM

## 2016-04-27 NOTE — Progress Notes (Signed)
PULMONARY / CRITICAL CARE MEDICINE   Name: Julie Foley MRN: [id number] DOB: 1956/02/17    ADMISSION DATE:  04/08/2016 CONSULTATION DATE:  04/07/2016  REFERRING MD:  Dr. Huel CoteQuigley   CHIEF COMPLAINT:  Cardiac Arrest   HISTORY OF PRESENT ILLNESS:   This is a 61 yo female with a PMH of HTN, GERD, Depression, Severe COPD (Gold stage IV, FEV1 34% 06/2009), Severe persistent asthma, Chronic home O2 at 4L, Diastolic CHF, Anxiety with panic attacks, and Polysubstance abuse.  She [facility] ER 01/22 post PEA arrest.  Per ER notes the pt notified EMS due to c/o severe shortness of breath. According to pts daughter she was breathing ok the morning of 01/22.  Upon EMS arrival the pt was found to be in respiratory distress then went apneic became pulseless and then asystole ACLS protocol initiated with ROSC 10 minutes following CPR.  She was intubated in the field and transported to the ER.  Upon arrival to the ER she was breathing over the ventilator but not following commands, however she became bradycardic then went into PEA arrest ACLS protocol initiated she was administered 2 mg iv magnesium, 1 amp of bicarb, multiple doses of epi, and 1L fluid bolus with ROSC after 5 mins of CPR.  She coded again in the ER became hypotensive, bradycardic then went into a PEA arrest 1 amp epi, 1 amp sodium bicarb and chest compressions initiated with ROSC 2 minutes after interventions.  PCCM contacted to admit pt to ICU due PEA arrest secondary to acute on chronic hypercapnic respiratory failure secondary to AECOPD requiring mechanical intubation with initiation of hypothermic protocol.  REVIEW OF SYSTEMS:   Unable to assess pt intubated   SUBJECTIVE:  Remains critically ill CT head 1/25 no acute changes MRI 1/27 shows extensive brain injury Neurology consult appreciated Sedation as needed Will discuss final results with family-I anticipate that family wants to discuss with Neuro   VITAL SIGNS: BP 114/70    Pulse 72   Temp 98.3 F (36.8 C) (Axillary)   Resp (!) 25   Ht 5\' 3"  (1.6 m)   Wt 189 lb 13.1 oz (86.1 kg)   SpO2 99%   BMI 33.62 kg/m   HEMODYNAMICS:    VENTILATOR SETTINGS: Vent Mode: PRVC FiO2 (%):  [25 %-35 %] 35 % Set Rate:  [25 bmp] 25 bmp Vt Set:  [500 mL] 500 mL PEEP:  [5 cmH20] 5 cmH20 Plateau Pressure:  [23 cmH20] 23 cmH20  INTAKE / OUTPUT: I/O last 3 completed shifts: In: 940.7 [I.V.:740.7; IV Piggyback:200] Out: 1475 [Urine:1125; Emesis/NG output:350]  PHYSICAL EXAMINATION: General: acutely ill appearing AA female Neuro:  Sedated, unresponsive, pupils unequal right 1 mm sluggish round, left 3 mm sluggish round HEENT: supple, mild JVD Cardiovascular: tachycardic, s1s2, no M/R/G Lungs: diminished very faint expiratory wheezes throughout, labored, tachypneic mechanically intubated  Abdomen: +BS x4, soft, obese, non tender, non distended  Musculoskeletal: normal bulk and tone, no edema Skin: intact no rashes or lesions GCS<8T LABS:  BMET  Recent Labs Lab 04/24/16 0444 04/25/16 0538 04/27/16 0634  NA 137 136 136  K 4.8 5.1 4.8  CL 106 106 104  CO2 24 25 26   BUN 22* 27* 44*  CREATININE 0.97 0.82 0.88  GLUCOSE 138* 149* 128*    Electrolytes  Recent Labs Lab 04/23/16 0951  04/24/16 0444 04/25/16 0538 04/27/16 0634  CALCIUM 7.8*  < > 8.5* 8.4* 8.6*  MG 1.7  --  2.5*  --  2.7*  PHOS 2.3*  --  3.6  --  5.0*  < > = values in this interval not displayed.  CBC  Recent Labs Lab 04/24/16 0444 04/25/16 0538 04/27/16 0634  WBC 21.4* 18.9* 15.5*  HGB 11.5* 10.2* 10.5*  HCT 34.7* 30.6* 31.2*  PLT 237 229 235    Coag's  Recent Labs Lab 04/22/16 0624 04/22/16 1021  APTT 26 25    Sepsis Markers  Recent Labs Lab 04/17/2016 2050 04/22/16 0211 04/22/16 0213 04/22/16 0624 04/23/16 0500  LATICACIDVEN 4.3*  --  1.8  --   --   PROCALCITON  --  1.79  --  3.87 4.19    ABG  Recent Labs Lab 04/19/2016 2228 04/22/16 0219 04/22/16 1102   PHART 7.15* 7.41 7.47*  PCO2ART 79* 42 38  PO2ART 274* 188* 161*    Liver Enzymes  Recent Labs Lab 04/01/2016 2015 04/22/16 1022 04/24/16 0444  AST 106*  --   --   ALT 70*  --   --   ALKPHOS 74  --   --   BILITOT 0.6  --   --   ALBUMIN 3.9 3.6 3.5    Cardiac Enzymes  Recent Labs Lab 04/22/16 0500 04/22/16 1021 04/22/16 1520  TROPONINI 0.43* 0.61* 0.55*    Glucose  Recent Labs Lab 04/26/16 1230 04/26/16 1656 04/26/16 2003 04/27/16 0002 04/27/16 0357 04/27/16 0711  GLUCAP 124* 124* 127* 124* 116* 117*    Imaging Mr Brain Wo Contrast  Result Date: 04/26/2016 CLINICAL DATA:  Anoxia EXAM: MRI HEAD WITHOUT CONTRAST TECHNIQUE: Multiplanar, multiecho pulse sequences of the brain and surrounding structures were obtained without intravenous contrast. COMPARISON:  Head CT from 2 days ago. FINDINGS: Brain: There is patchy low-grade restricted diffusion in the cortex of the bilateral cerebral convexities, roughly following the watershed territories. The caudate nuclei are symmetrically mildly diffusion hyperintense. These areas show edematous change on FLAIR imaging. No hemorrhage, hydrocephalus, or shift. Vascular: Preserved flow voids. Skull and upper cervical spine: No marrow lesion. 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 Sinuses/Orbits: Remote blowout fracture of the medial wall right orbit. Filling defect in the right sphenoid sinus is noncalcified by CT, most consistent with proteinaceous mucous retention cyst. There is maxillary fluid levels and nasopharyngeal opacification in the setting of intubation. Bilateral mastoid effusion. IMPRESSION: 1. Global anoxic injury pattern with patchy involvement of the watershed cortex and caudate nuclei. 2. Sinus and mastoid fluid levels in the setting of intubation. Electronically Signed   By: Marnee Spring M.D.   On: 04/26/2016 13:15   STUDIES:  None  CULTURES: Blood 01/22>>no growth Influenza PCR 01/22>>NEG MRSA  neg ANTIBIOTICS: None  SIGNIFICANT EVENTS: 01/22>>Pt admitted to ICU due PEA arrest secondary to acute on chronic hypercapnic respiratory failure secondary to AECOPD requiring mechanical intubation with initiation of hypothermic protocol 1/23-remains on hypothermia protocol 33 degrees 1/24 rewarming phase 1/25 signs of anoxic brain injtury 1/26 signs of anoxic brain injury, withdraws to pain 1/27 MRI brain extensive anoxic brain injury   LINES/TUBES: ETT 01/22>> Right femoral CVL 01/22>>  ASSESSMENT / PLAN:  PULMONARY A: Acute on chronic hypercapnic respiratory failure secondary to AECOPD PEA arrest secondary to severe acidosis  Hx: Severe persistent asthma and Chronic home O2 at 4L P:   Full vent support Maintain O2 sats 90% to 94% Aggressive bronchodilator therapy IV steroids -completed hypothermic protocol for goal temp of 33 degree's Celsius for 24hrs  CARDIOVASCULAR A:  PEA arrest secondary to respiratory arrest with severe acidosis  Hx: Diastolic CHF and HTN P:  Maintain map greater than 65  Cardiology consulted appreciate input   RENAL A:   Lactic acidosis likely secondary to Cardiac Arrest  Acute renal failure likely secondary to hypotension Foley catheter Follow UOP   GASTROINTESTINAL A:   No acute issues Hx: GERD  P:   Pepcid for PUD prophylaxis Tube feeds held+ileus  HEMATOLOGIC A:   No acute issues  P: Trend CBC's Subq heparin for VTE prophylaxis Monitor for s/sx of bleeding   INFECTIOUS A:   Mild leukocytosis  P:   Trend WBC's and monitor fever curve Follow cultures   ENDOCRINE A:   Hyperglycemia  P:   CBG's q1hr Will initiate insulin gtt if remains hyperglycemic  NEUROLOGIC A:   Acute encephalopathy post PEA arrest Hx: Anxiety and Polysubstance abuse P:   RASS goal: -2 Assess RASS every 4 hours  -Versed, and Fentanyl gtts to maintain RASS goal -1 CT head no acute changes  Neurology consulted-MRI shows extensive brain  damage  I have personally obtained a history, examined the patient, evaluated Pertinent laboratory and [employer]/imaging results, and  formulated the assessment and plan   The Patient requires high complexity decision making for assessment and support, frequent evaluation and titration of therapies, application of advanced monitoring technologies and extensive interpretation of multiple databases. Critical Care Time devoted to patient care services described in this note is 35 minutes.   Overall, patient is critically ill, prognosis is guarded.  Patient with Multiorgan failure and at high risk for cardiac arrest and death.   Will get palliative care consult, recommend DNR status Prognosis is very poor, very poor chance of meaningful recovery  Lucie Leather, M.D.  [facility]  Medical Director [facility] Medical Director [facility] Cardio-Pulmonary Department

## 2016-04-27 NOTE — Progress Notes (Signed)
Subjective: Patient unchanged.    Objective: Current vital signs: BP (!) 146/74   Pulse 81   Temp 98.3 F (36.8 C) (Axillary)   Resp (!) 25   Ht 5' 3"  (1.6 m)   Wt 86.1 kg (189 lb 13.1 oz)   SpO2 97%   BMI 33.62 kg/m  Vital signs in last 24 hours: Temp:  [98.3 F (36.8 C)-98.5 F (36.9 C)] 98.3 F (36.8 C) (01/28 0800) Pulse Rate:  [64-85] 81 (01/28 1023) Resp:  [20-25] 25 (01/28 1023) BP: (114-165)/(66-120) 146/74 (01/28 1000) SpO2:  [96 %-100 %] 97 % (01/28 1023) FiO2 (%):  [25 %-35 %] 35 % (01/28 1155)  Intake/Output from previous day: 01/27 0701 - 01/28 0700 In: 677.2 [I.V.:477.2; IV Piggyback:200] Out: 875 [Urine:775; Emesis/NG output:100] Intake/Output this shift: Total I/O In: 261.5 [I.V.:91.5; NG/GT:120; IV Piggyback:50] Out: -  Nutritional status:    Neurologic Exam: Mental Status: Patient does not respond to verbal stimuli. Does not follow commands. No verbalizations noted.  Cranial Nerves: II: patient does not respond confrontation bilaterally, pupils right 72m, left 263mand reactivebilaterally III,IV,VI: doll's response present bilaterally.  Eyes open, blinking intermittently V,VII: corneal reflex present bilaterally, gaze dysconjugate VIII: patient does not respond to verbal stimuli IX,X: gag reflex reduced, XI: trapezius strength unable to test bilaterally XII: tongue strength unable to test Motor: Extremities flaccid throughout. No spontaneous movement noted. No purposeful movements noted. Sensory: Does not respond to noxious stimuli in any extremity. Deep Tendon Reflexes:  3-4+ throughout with sustained clonus at the ankles  Lab Results: Basic Metabolic Panel:  Recent Labs Lab 04/22/16 1811  04/23/16 0500 04/23/16 0951  04/23/16 1808 04/23/16 2236 04/24/16 0444 04/25/16 0538 04/27/16 0634  NA 140  < > 140 141  < > 139 139 137 136 136  K 3.4*  < > 3.2* 3.3*  < > 4.2 4.6 4.8 5.1 4.8  CL 106  < > 111 110  < > 107 108 106 106 104   CO2 23  < > 21* 23  < > 23 23 24 25 26   GLUCOSE 189*  < > 153* 154*  < > 135* 141* 138* 149* 128*  BUN 15  < > 11 11  < > 14 18 22* 27* 44*  CREATININE 0.73  < > 0.49 0.54  < > 1.02* 0.98 0.97 0.82 0.88  CALCIUM 7.6*  < > 7.2* 7.8*  < > 8.4* 8.4* 8.5* 8.4* 8.6*  MG 2.0  --  1.7 1.7  --   --   --  2.5*  --  2.7*  PHOS 2.9  --  2.0* 2.3*  --   --   --  3.6  --  5.0*  < > = values in this interval not displayed.  Liver Function Tests:  Recent Labs Lab 04/19/2016 2015 04/22/16 1022 04/24/16 0444  AST 106*  --   --   ALT 70*  --   --   ALKPHOS 74  --   --   BILITOT 0.6  --   --   PROT 6.4*  --   --   ALBUMIN 3.9 3.6 3.5   No results for input(s): LIPASE, AMYLASE in the last 168 hours. No results for input(s): AMMONIA in the last 168 hours.  CBC:  Recent Labs Lab 04/15/2016 2015 04/22/16 0624 04/24/16 0444 04/25/16 0538 04/27/16 0634  WBC 11.3* 21.3* 21.4* 18.9* 15.5*  NEUTROABS 6.7*  --   --   --   --  HGB 12.1 12.3 11.5* 10.2* 10.5*  HCT 37.2 37.2 34.7* 30.6* 31.2*  MCV 101.8* 99.3 98.1 97.0 95.7  PLT 279 234 237 229 235    Cardiac Enzymes:  Recent Labs Lab 04/26/2016 2015 04/22/16 0500 04/22/16 1021 04/22/16 1520  TROPONINI 0.04* 0.43* 0.61* 0.55*    Lipid Panel: No results for input(s): CHOL, TRIG, HDL, CHOLHDL, VLDL, LDLCALC in the last 168 hours.  CBG:  Recent Labs Lab 04/26/16 1656 04/26/16 2003 04/27/16 0002 04/27/16 0357 04/27/16 0711  GLUCAP 124* 127* 124* 116* 117*    Microbiology: Results for orders placed or performed during the hospital encounter of 04/26/2016  MRSA PCR Screening     Status: None   Collection Time: 04/22/16  1:00 AM  Result Value Ref Range Status   MRSA by PCR NEGATIVE NEGATIVE Final    Comment:        The GeneXpert MRSA Assay (FDA approved for NASAL specimens only), is one component of a comprehensive MRSA colonization surveillance program. It is not intended to diagnose MRSA infection nor to guide or monitor  treatment for MRSA infections.   Culture, blood (routine x 2)     Status: None (Preliminary result)   Collection Time: 04/22/16  6:24 AM  Result Value Ref Range Status   Specimen Description BLOOD R AC  Final   Special Requests   Final    BOTTLES DRAWN AEROBIC AND ANAEROBIC AER 9ML ANA 4ML   Culture NO GROWTH 4 DAYS  Final   Report Status PENDING  Incomplete  Culture, blood (routine x 2)     Status: None (Preliminary result)   Collection Time: 04/22/16 10:22 AM  Result Value Ref Range Status   Specimen Description BLOOD R AC  Final   Special Requests BOTTLES DRAWN AEROBIC AND ANAEROBIC 2ML  Final   Culture NO GROWTH 4 DAYS  Final   Report Status PENDING  Incomplete    Coagulation Studies: No results for input(s): LABPROT, INR in the last 72 hours.  Imaging: Mr Brain Wo Contrast  Result Date: 04/26/2016 CLINICAL DATA:  Anoxia EXAM: MRI HEAD WITHOUT CONTRAST TECHNIQUE: Multiplanar, multiecho pulse sequences of the brain and surrounding structures were obtained without intravenous contrast. COMPARISON:  Head CT from 2 days ago. FINDINGS: Brain: There is patchy low-grade restricted diffusion in the cortex of the bilateral cerebral convexities, roughly following the watershed territories. The caudate nuclei are symmetrically mildly diffusion hyperintense. These areas show edematous change on FLAIR imaging. No hemorrhage, hydrocephalus, or shift. Vascular: Preserved flow voids. Skull and upper cervical spine: No marrow lesion. 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 Sinuses/Orbits: Remote blowout fracture of the medial wall right orbit. Filling defect in the right sphenoid sinus is noncalcified by CT, most consistent with proteinaceous mucous retention cyst. There is maxillary fluid levels and nasopharyngeal opacification in the setting of intubation. Bilateral mastoid effusion. IMPRESSION: 1. Global anoxic injury pattern with patchy involvement of the watershed cortex and caudate  nuclei. 2. Sinus and mastoid fluid levels in the setting of intubation. Electronically Signed   By: Monte Fantasia M.D.   On: 04/26/2016 13:15    Medications:  I have reviewed the patient's current medications. Scheduled: . artificial tears  1 application Both Eyes Z1I  . ceFEPIme  1 g Intravenous Q8H  . chlorhexidine gluconate (MEDLINE KIT)  15 mL Mouth Rinse BID  . sennosides  5 mL Per Tube BID   And  . docusate  100 mg Per Tube BID  . famotidine  20 mg Oral BID  . fentaNYL (SUBLIMAZE) injection  100 mcg Intravenous Once  . heparin  5,000 Units Subcutaneous Q8H  . insulin aspart  0-9 Units Subcutaneous Q4H  . ipratropium-albuterol  3 mL Nebulization Q4H  . mouth rinse  15 mL Mouth Rinse 10 times per day  . methylPREDNISolone (SOLU-MEDROL) injection  40 mg Intravenous Q8H  . midazolam  2 mg Intravenous Once  . scopolamine  1 patch Transdermal Q72H  . sodium chloride flush  10-40 mL Intracatheter Q12H  . vecuronium  10 mg Intravenous Once    Assessment/Plan: Patient unchanged.  Results of MRI and poor prognosis discussed with daughter.  She reports she will be speaking with family further about decisions on further care.    Case discussed with Dr. Mortimer Fries.     LOS: 6 days   Alexis Goodell, MD Neurology [phone] 04/27/2016  1:11 PM

## 2016-04-27 NOTE — Progress Notes (Signed)
Pt Wake up assessment deferred until family arrival per Dr.Kasa verbal orders. Will continue to assess.

## 2016-04-27 NOTE — Progress Notes (Addendum)
MEDICATION RELATED CONSULT NOTE   Pharmacy Consult for electrolytes, antibiotic dosing, and constipation prevention  Assessment: 60yoF found to have cardiac arrest secondary to AECOPD. Patient is now rewarmed from ICE protocol.    Plan: 1. Electrolytes: Labs within normal limits. Will continue to trend electrolytes as needed.  2. HCAP: Continue Cefepime 1g IV Q8h.   3. Constipation prevention: Patient receiving tube feeds. Will continue senna/docusate VT BID. Will advance to 2 tabs BID.   Allergies  Allergen Reactions  . Symbicort [Budesonide-Formoterol Fumarate] Swelling and Other (See Comments)    Reaction:  Tongue swelling   . Formoterol Fumarate Swelling and Other (See Comments)    Reaction:  Tongue swelling    Patient Measurements: Height: 5\' 3"  (160 cm) Weight: 189 lb 13.1 oz (86.1 kg) IBW/kg (Calculated) : 52.4  Vital Signs: Temp: 98.3 F (36.8 C) (01/28 0800) Temp Source: Axillary (01/28 0800) BP: 146/74 (01/28 1000) Pulse Rate: 81 (01/28 1023) Intake/Output from previous day: 01/27 0701 - 01/28 0700 In: 677.2 [I.V.:477.2; IV Piggyback:200] Out: 875 [Urine:775; Emesis/NG output:100] Intake/Output from this shift: Total I/O In: 261.5 [I.V.:91.5; NG/GT:120; IV Piggyback:50] Out: -   Labs:  Recent Labs  04/25/16 0538 04/27/16 0634  WBC 18.9* 15.5*  HGB 10.2* 10.5*  HCT 30.6* 31.2*  PLT 229 235  CREATININE 0.82 0.88  MG  --  2.7*  PHOS  --  5.0*   Estimated Creatinine Clearance: 72.5 mL/min (by C-G formula based on SCr of 0.88 mg/dL).   Microbiology: Recent Results (from the past 720 hour(s))  MRSA PCR Screening     Status: None   Collection Time: 04/22/16  1:00 AM  Result Value Ref Range Status   MRSA by PCR NEGATIVE NEGATIVE Final    Comment:        The GeneXpert MRSA Assay (FDA approved for NASAL specimens only), is one component of a comprehensive MRSA colonization surveillance program. It is not intended to diagnose MRSA infection nor  to guide or monitor treatment for MRSA infections.   Culture, blood (routine x 2)     Status: None   Collection Time: 04/22/16  6:24 AM  Result Value Ref Range Status   Specimen Description BLOOD R AC  Final   Special Requests   Final    BOTTLES DRAWN AEROBIC AND ANAEROBIC AER 9ML ANA 4ML   Culture NO GROWTH 5 DAYS  Final   Report Status 04/27/2016 FINAL  Final  Culture, blood (routine x 2)     Status: None   Collection Time: 04/22/16 10:22 AM  Result Value Ref Range Status   Specimen Description BLOOD R AC  Final   Special Requests BOTTLES DRAWN AEROBIC AND ANAEROBIC 2ML  Final   Culture NO GROWTH 5 DAYS  Final   Report Status 04/27/2016 FINAL  Final    Medical History: Past Medical History:  Diagnosis Date  . Anxiety   . Asthma   . CHF (congestive heart failure) (HCC)   . Chronic respiratory failure (HCC)   . COPD (chronic obstructive pulmonary disease) (HCC)   . Depression   . GERD (gastroesophageal reflux disease)   . HTN (hypertension)    1/23 BCx: NGTD 1/23 MRSA PCR: negative  Videl Nobrega K, RPh 04/27/2016,3:44 PM

## 2016-04-27 NOTE — Progress Notes (Signed)
Pt daughter and other family members to bedside at this time. Will begin wake up assessment. Pt currently opening eyes to voice, not tracking or following commands. Full wake up assessment in EPIC.

## 2016-04-28 DIAGNOSIS — G931 Anoxic brain damage, not elsewhere classified: Secondary | ICD-10-CM

## 2016-04-28 LAB — PHOSPHORUS: Phosphorus: 4.9 mg/dL — ABNORMAL HIGH (ref 2.5–4.6)

## 2016-04-28 LAB — BASIC METABOLIC PANEL
ANION GAP: 7 (ref 5–15)
BUN: 42 mg/dL — ABNORMAL HIGH (ref 6–20)
CHLORIDE: 106 mmol/L (ref 101–111)
CO2: 24 mmol/L (ref 22–32)
Calcium: 8.8 mg/dL — ABNORMAL LOW (ref 8.9–10.3)
Creatinine, Ser: 0.87 mg/dL (ref 0.44–1.00)
GFR calc Af Amer: >60 mL/min (ref >60)
GLUCOSE: 147 mg/dL — AB (ref 65–99)
POTASSIUM: 4.4 mmol/L (ref 3.5–5.1)
Sodium: 137 mmol/L (ref 135–145)

## 2016-04-28 LAB — GLUCOSE, CAPILLARY
GLUCOSE-CAPILLARY: 119 mg/dL — AB (ref 65–99)
GLUCOSE-CAPILLARY: 134 mg/dL — AB (ref 65–99)
Glucose-Capillary: 156 mg/dL — ABNORMAL HIGH (ref 65–99)
Glucose-Capillary: 139 mg/dL — ABNORMAL HIGH (ref 65–99)
Glucose-Capillary: 162 mg/dL — ABNORMAL HIGH (ref 65–99)
Glucose-Capillary: 132 mg/dL — ABNORMAL HIGH (ref 65–99)
Glucose-Capillary: 119 mg/dL — ABNORMAL HIGH (ref 65–99)

## 2016-04-28 LAB — MAGNESIUM: MAGNESIUM: 2.5 mg/dL — AB (ref 1.7–2.4)

## 2016-04-28 MED ORDER — IPRATROPIUM-ALBUTEROL 0.5-2.5 (3) MG/3ML IN SOLN
3.0000 mL | Freq: Four times a day (QID) | RESPIRATORY_TRACT | Status: DC
  Administered 2016-04-28 – 2016-04-29 (×5): 3 mL via RESPIRATORY_TRACT
  Filled 2016-04-28 (×4): qty 3

## 2016-04-28 MED ORDER — FENTANYL 2500MCG IN NS 250ML (10MCG/ML) PREMIX INFUSION
0.0000 ug/h | INTRAVENOUS | Status: DC
Start: 2016-04-28 — End: 2016-04-28

## 2016-04-28 MED ORDER — LABETALOL HCL 5 MG/ML IV SOLN: 20.0000 mg | INTRAVENOUS | Status: DC | PRN

## 2016-04-28 MED ORDER — HYDRALAZINE HCL 20 MG/ML IJ SOLN
10.0000 mg | INTRAMUSCULAR | Status: DC | PRN
  Administered 2016-04-28: 20 mg via INTRAVENOUS
  Filled 2016-04-28: qty 1

## 2016-04-28 MED ORDER — FAMOTIDINE 20 MG PO TABS
20.0000 mg | ORAL_TABLET | Freq: Two times a day (BID) | ORAL | Status: DC
  Administered 2016-04-28 – 2016-04-29 (×2): 20 mg
  Filled 2016-04-28 (×2): qty 1

## 2016-04-28 MED ORDER — SODIUM CHLORIDE 0.9 % IV SOLN
0.0000 ug/h | INTRAVENOUS | Status: DC
  Administered 2016-04-28: 100 ug/h via INTRAVENOUS
  Administered 2016-04-29 (×2): 300 ug/h via INTRAVENOUS
  Administered 2016-04-30: 100 ug/h via INTRAVENOUS
  Administered 2016-04-30 (×2): 300 ug/h via INTRAVENOUS
  Filled 2016-04-28 (×5): qty 50

## 2016-04-28 MED ORDER — HYDRALAZINE HCL 20 MG/ML IJ SOLN
10.0000 mg | INTRAMUSCULAR | Status: DC
  Administered 2016-04-28: 20 mg via INTRAVENOUS
  Administered 2016-04-28: 30 mg via INTRAVENOUS
  Administered 2016-04-28 – 2016-04-29 (×4): 20 mg via INTRAVENOUS
  Filled 2016-04-28: qty 1
  Filled 2016-04-28 (×2): qty 2
  Filled 2016-04-28: qty 1
  Filled 2016-04-28: qty 2

## 2016-04-28 MED ORDER — ACETAMINOPHEN 325 MG PO TABS: 650.0000 mg | ORAL_TABLET | ORAL | Status: DC | PRN

## 2016-04-28 MED ORDER — METOPROLOL TARTRATE 5 MG/5ML IV SOLN
2.5000 mg | INTRAVENOUS | Status: DC | PRN
Start: 2016-04-28 — End: 2016-04-30
  Filled 2016-04-28 (×2): qty 5

## 2016-04-28 MED ORDER — LORAZEPAM 2 MG/ML IJ SOLN
0.5000 mg | INTRAMUSCULAR | Status: DC | PRN
  Administered 2016-04-28: 0.5 mg via INTRAVENOUS
  Filled 2016-04-28: qty 1

## 2016-04-28 MED ORDER — METOPROLOL TARTRATE 25 MG/10 ML ORAL SUSPENSION
25.0000 mg | Freq: Two times a day (BID) | ORAL | Status: DC
  Administered 2016-04-28 (×2): 25 mg
  Filled 2016-04-28 (×5): qty 10

## 2016-04-28 MED ORDER — FENTANYL CITRATE (PF) 100 MCG/2ML IJ SOLN
25.0000 ug | INTRAMUSCULAR | Status: DC | PRN
  Administered 2016-04-28: 50 ug via INTRAVENOUS
  Filled 2016-04-28: qty 2

## 2016-04-28 NOTE — Progress Notes (Signed)
Pt remains sedated and on ventilator. Pt has been hypertensive all shift. MD aware. New orders given .Unable to educate pt due to patient condition. Education provided to family. Full assessment in EPIC. Report given to SaludaBeth, Charity fundraiserN.

## 2016-04-28 NOTE — Progress Notes (Signed)
eLink Physician-Brief Progress Note Patient Name: Julie BunSylvia L Garvey DOB: [birth year] MRN: [id number]   Date of Service  04/28/2016  HPI/Events of Note  BP 200/100  eICU Interventions  Increase hydralazine frequency to q2 Add labetalol IV PRN     Intervention Category Major Interventions: Hypertension - evaluation and management  Savilla Turbyfill 04/28/2016, 5:19 PM

## 2016-04-28 NOTE — Progress Notes (Signed)
Chaplain was making rounds and visited with pt in room IC-15. Pt was unconscious so the chaplain provided a silent prayer.    04/28/16 1030  Clinical Encounter Type  Visited With Patient  Visit Type Initial;Spiritual support  Referral From Nurse  Consult/Referral To Chaplain  Spiritual Encounters  Spiritual Needs Prayer

## 2016-04-28 NOTE — Progress Notes (Addendum)
PULMONARY / CRITICAL CARE MEDICINE   Name: Julie Foley MRN: [id number] DOB: November 14, 1955    ADMISSION DATE:  27-Apr-2016 CONSULTATION DATE:  04/27/16  REFERRING MD:  Dr. Huel Cote   CHIEF COMPLAINT:  Cardiac Arrest   HISTORY OF PRESENT ILLNESS:   This is a 61 yo female with a PMH of HTN, GERD, Depression, Severe COPD (Gold stage IV, FEV1 34% 06/2009), Severe persistent asthma, Chronic home O2 at 4L, Diastolic CHF, Anxiety with panic attacks, and Polysubstance abuse.  She presented to [facility] ER 01/22 post PEA arrest.  Per ER notes the pt notified EMS due to c/o severe shortness of breath. According to pts daughter she was breathing ok the morning of 01/22.  Upon EMS arrival the pt was found to be in respiratory distress then went apneic became pulseless and then asystole ACLS protocol initiated with ROSC 10 minutes following CPR.  She was intubated in the field and transported to the ER.  Upon arrival to the ER she was breathing over the ventilator but not following commands, however she became bradycardic then went into PEA arrest ACLS protocol initiated she was administered 2 mg iv magnesium, 1 amp of bicarb, multiple doses of epi, and 1L fluid bolus with ROSC after 5 mins of CPR.  She coded again in the ER became hypotensive, bradycardic then went into a PEA arrest 1 amp epi, 1 amp sodium bicarb and chest compressions initiated with ROSC 2 minutes after interventions.  PCCM contacted to admit pt to ICU due PEA arrest secondary to acute on chronic hypercapnic respiratory failure secondary to AECOPD requiring mechanical intubation with initiation of hypothermic protocol.  REVIEW OF SYSTEMS:   Unable to assess pt intubated   SUBJECTIVE:  Comatose   VITAL SIGNS: BP (!) 204/125 Comment: Hydralizine given  Pulse 96   Temp 98.7 F (37.1 C) (Axillary)   Resp 14   Ht 5\' 3"  (1.6 m)   Wt 184 lb 4.9 oz (83.6 kg)   SpO2 100%   BMI 32.65 kg/m   HEMODYNAMICS:    VENTILATOR SETTINGS: Vent Mode:  PRVC FiO2 (%):  [35 %-40 %] 40 % Set Rate:  [16 bmp-25 bmp] 16 bmp Vt Set:  [500 mL] 500 mL PEEP:  [5 cmH20] 5 cmH20 Plateau Pressure:  [19 cmH20-25 cmH20] 19 cmH20  INTAKE / OUTPUT: I/O last 3 completed shifts: In: 1100.4 [I.V.:680.4; NG/GT:120; IV Piggyback:300] Out: 825 [Urine:825]  PHYSICAL EXAMINATION: General: acutely ill appearing AA female Neuro:  Sedated, unresponsive, pupils unequal right 1 mm sluggish round, left 3 mm sluggish round HEENT: supple, mild JVD Cardiovascular: tachycardic, s1s2, no M/R/G Lungs: diminished very faint expiratory wheezes throughout, labored, tachypneic mechanically intubated  Abdomen: +BS x4, soft, obese, non tender, non distended  Musculoskeletal: normal bulk and tone, no edema Skin: intact no rashes or lesions GCS<8T LABS:  BMET  Recent Labs Lab 04/25/16 0538 04/27/16 0634 04/28/16 0516  NA 136 136 137  K 5.1 4.8 4.4  CL 106 104 106  CO2 25 26 24   BUN 27* 44* 42*  CREATININE 0.82 0.88 0.87  GLUCOSE 149* 128* 147*    Electrolytes  Recent Labs Lab 04/24/16 0444 04/25/16 0538 04/27/16 0634 04/28/16 0516  CALCIUM 8.5* 8.4* 8.6* 8.8*  MG 2.5*  --  2.7* 2.5*  PHOS 3.6  --  5.0* 4.9*    CBC  Recent Labs Lab 04/24/16 0444 04/25/16 0538 04/27/16 0634  WBC 21.4* 18.9* 15.5*  HGB 11.5* 10.2* 10.5*  HCT 34.7* 30.6* 31.2*  PLT 237 229 235    Coag's  Recent Labs Lab 04/22/16 0624 04/22/16 1021  APTT 26 25    Sepsis Markers  Recent Labs Lab May 08, 2016 2050 04/22/16 0211 04/22/16 0213 04/22/16 0624 04/23/16 0500  LATICACIDVEN 4.3*  --  1.8  --   --   PROCALCITON  --  1.79  --  3.87 4.19    ABG  Recent Labs Lab 05-08-2016 2228 04/22/16 0219 04/22/16 1102  PHART 7.15* 7.41 7.47*  PCO2ART 79* 42 38  PO2ART 274* 188* 161*    Liver Enzymes  Recent Labs Lab 05-08-2016 2015 04/22/16 1022 04/24/16 0444  AST 106*  --   --   ALT 70*  --   --   ALKPHOS 74  --   --   BILITOT 0.6  --   --   ALBUMIN 3.9  3.6 3.5    Cardiac Enzymes  Recent Labs Lab 04/22/16 0500 04/22/16 1021 04/22/16 1520  TROPONINI 0.43* 0.61* 0.55*    Glucose  Recent Labs Lab 04/27/16 2340 04/28/16 0348 04/28/16 0525 04/28/16 0704 04/28/16 1115 04/28/16 1605  GLUCAP 145* 156* 119* 139* 162* 134*    Imaging No results found. STUDIES:  None  CULTURES: Blood 01/22>>no growth Influenza PCR 01/22>>NEG MRSA neg ANTIBIOTICS: None  SIGNIFICANT EVENTS: 01/22>>Pt admitted to ICU due PEA arrest secondary to acute on chronic hypercapnic respiratory failure secondary to AECOPD requiring mechanical intubation with initiation of hypothermic protocol 1/23-remains on hypothermia protocol 33 degrees 1/24 rewarming phase 1/25 signs of anoxic brain injtury 1/26 signs of anoxic brain injury, withdraws to pain 1/27 MRI brain extensive anoxic brain injury   LINES/TUBES: ETT 01/22>> Right femoral CVL 01/22>>  ASSESSMENT / PLAN:  PULMONARY A: Acute on chronic hypercapnic respiratory failure secondary to AECOPD PEA arrest secondary to severe acidosis  Hx: Severe persistent asthma and Chronic home O2 at 4L P:   Full vent support Maintain O2 sats 90% to 94% Aggressive bronchodilator therapy IV steroids -completed hypothermic protocol for goal temp of 33 degree's Celsius for 24hrs  CARDIOVASCULAR A:  PEA arrest secondary to respiratory arrest with severe acidosis  Hx: Diastolic CHF and HTN P:  Maintain map greater than 65  Cardiology consulted appreciate input   RENAL A:   Lactic acidosis likely secondary to Cardiac Arrest  Acute renal failure likely secondary to hypotension Foley catheter Follow UOP   GASTROINTESTINAL A:   No acute issues Hx: GERD  P:   Pepcid for PUD prophylaxis Tube feeds held+ileus  HEMATOLOGIC A:   No acute issues  P: Trend CBC's Subq heparin for VTE prophylaxis Monitor for s/sx of bleeding   INFECTIOUS A:   Mild leukocytosis  P:   Trend WBC's and monitor  fever curve Follow cultures   ENDOCRINE A:   Hyperglycemia  P:   CBG's q1hr Will initiate insulin gtt if remains hyperglycemic  NEUROLOGIC A:   Acute encephalopathy post PEA arrest Hx: Anxiety and Polysubstance abuse P:   RASS goal: -2 Assess RASS every 4 hours  -Versed, and Fentanyl gtts to maintain RASS goal -1 CT head no acute changes  Neurology consulted-MRI shows extensive brain damage  Family has not been seen today. Need to address goals of care. Favor vent withdrawal  CCM time: 30 mins The above time includes time spent in consultation with patient and/or family members and reviewing care plan on multidisciplinary rounds  Billy Fischer, MD PCCM service Mobile [phone] Pager [phone]   ADD: family arrived late in day. We discussed  EOL issues. One daughter left before the conversation because she was emotionally distraught but the other daughter, Royal PiedraJaneese and the pt's common law husband recognize that the best possible survival is not one that [name] would desire for herself. We made pt DNR with no escalation of care. We will try to get all loved ones and family members together 01/30 so that we can discuss vent withdrawal and full comfort care   Billy Fischeravid Simonds, MD PCCM service Mobile [phone] Pager [phone] 04/28/2016

## 2016-04-29 LAB — GLUCOSE, CAPILLARY
GLUCOSE-CAPILLARY: 140 mg/dL — AB (ref 65–99)
Glucose-Capillary: 123 mg/dL — ABNORMAL HIGH (ref 65–99)
Glucose-Capillary: 115 mg/dL — ABNORMAL HIGH (ref 65–99)
Glucose-Capillary: 96 mg/dL (ref 65–99)

## 2016-04-29 MED ORDER — FENTANYL CITRATE (PF) 100 MCG/2ML IJ SOLN: 25.0000 ug | INTRAMUSCULAR | Status: DC | PRN

## 2016-04-29 MED ORDER — FAMOTIDINE 20 MG PO TABS
20.0000 mg | ORAL_TABLET | Freq: Every day | ORAL | Status: DC
  Administered 2016-04-30: 20 mg
  Filled 2016-04-29: qty 1

## 2016-04-29 MED ORDER — METOPROLOL TARTRATE 25 MG/10 ML ORAL SUSPENSION
50.0000 mg | Freq: Two times a day (BID) | ORAL | Status: DC
  Administered 2016-04-29 – 2016-04-30 (×2): 50 mg
  Filled 2016-04-29 (×5): qty 20

## 2016-04-29 MED ORDER — IPRATROPIUM-ALBUTEROL 0.5-2.5 (3) MG/3ML IN SOLN: 3.0000 mL | RESPIRATORY_TRACT | Status: DC | PRN

## 2016-04-29 MED ORDER — HYDRALAZINE HCL 20 MG/ML IJ SOLN: 10.0000 mg | INTRAMUSCULAR | Status: DC | PRN

## 2016-04-29 MED ORDER — LORAZEPAM 2 MG/ML IJ SOLN
0.5000 mg | INTRAMUSCULAR | Status: DC | PRN
  Administered 2016-04-29 (×2): 1 mg via INTRAVENOUS
  Filled 2016-04-29 (×2): qty 1

## 2016-04-29 NOTE — Progress Notes (Signed)
PULMONARY / CRITICAL CARE MEDICINE   Name: Julie Foley MRN: [id number] DOB: 10/11/1955    ADMISSION DATE:  04/29/2016 CONSULTATION DATE:  04/11/2016  REFERRING MD:  Dr. Huel Cote   CHIEF COMPLAINT:  Cardiac Arrest   HISTORY OF PRESENT ILLNESS:   This is a 61 yo female with a PMH of HTN, GERD, Depression, Severe COPD (Gold stage IV, FEV1 34% 06/2009), Severe persistent asthma, Chronic home O2 at 4L, Diastolic CHF, Anxiety with panic attacks, and Polysubstance abuse.  She presented to [facility] ER 01/22 post PEA arrest.  Per ER notes the pt notified EMS due to c/o severe shortness of breath. According to pts daughter she was breathing ok the morning of 01/22.  Upon EMS arrival the pt was found to be in respiratory distress then went apneic became pulseless and then asystole ACLS protocol initiated with ROSC 10 minutes following CPR.  She was intubated in the field and transported to the ER.  Upon arrival to the ER she was breathing over the ventilator but not following commands, however she became bradycardic then went into PEA arrest ACLS protocol initiated she was administered 2 mg iv magnesium, 1 amp of bicarb, multiple doses of epi, and 1L fluid bolus with ROSC after 5 mins of CPR.  She coded again in the ER became hypotensive, bradycardic then went into a PEA arrest 1 amp epi, 1 amp sodium bicarb and chest compressions initiated with ROSC 2 minutes after interventions.  PCCM contacted to admit pt to ICU due PEA arrest secondary to acute on chronic hypercapnic respiratory failure secondary to AECOPD requiring mechanical intubation with initiation of hypothermic protocol.  REVIEW OF SYSTEMS:   Unable to assess pt intubated   SUBJECTIVE:  Comatose   VITAL SIGNS: BP (!) 146/85   Pulse 100   Temp 99.4 F (37.4 C) (Axillary)   Resp 16   Ht 5\' 3"  (1.6 m)   Wt 186 lb 11.7 oz (84.7 kg)   SpO2 98%   BMI 33.08 kg/m   HEMODYNAMICS:    VENTILATOR SETTINGS: Vent Mode: PRVC FiO2 (%):  [40 %] 40  % Set Rate:  [16 bmp] 16 bmp Vt Set:  [500 mL] 500 mL PEEP:  [5 cmH20] 5 cmH20 Plateau Pressure:  [0 cmH20] 0 cmH20  INTAKE / OUTPUT: I/O last 3 completed shifts: In: 371.3 [I.V.:371.3] Out: 355 [Urine:355]  PHYSICAL EXAMINATION: General: acutely ill appearing AA female Neuro:  Sedated, unresponsive, pupils unequal right 1 mm sluggish round, left 3 mm sluggish round HEENT: supple, mild JVD Cardiovascular: tachycardic, s1s2, no M/R/G Lungs: diminished very faint expiratory wheezes throughout, labored, tachypneic mechanically intubated  Abdomen: +BS x4, soft, obese, non tender, non distended  Musculoskeletal: normal bulk and tone, no edema Skin: intact no rashes or lesions GCS<8T LABS:  BMET  Recent Labs Lab 04/25/16 0538 04/27/16 0634 04/28/16 0516  NA 136 136 137  K 5.1 4.8 4.4  CL 106 104 106  CO2 25 26 24   BUN 27* 44* 42*  CREATININE 0.82 0.88 0.87  GLUCOSE 149* 128* 147*    Electrolytes  Recent Labs Lab 04/24/16 0444 04/25/16 0538 04/27/16 0634 04/28/16 0516  CALCIUM 8.5* 8.4* 8.6* 8.8*  MG 2.5*  --  2.7* 2.5*  PHOS 3.6  --  5.0* 4.9*    CBC  Recent Labs Lab 04/24/16 0444 04/25/16 0538 04/27/16 0634  WBC 21.4* 18.9* 15.5*  HGB 11.5* 10.2* 10.5*  HCT 34.7* 30.6* 31.2*  PLT 237 229 235    Coag's  No results for input(s): APTT, INR in the last 168 hours.  Sepsis Markers  Recent Labs Lab 04/23/16 0500  PROCALCITON 4.19    ABG No results for input(s): PHART, PCO2ART, PO2ART in the last 168 hours.  Liver Enzymes  Recent Labs Lab 04/24/16 0444  ALBUMIN 3.5    Cardiac Enzymes No results for input(s): TROPONINI, PROBNP in the last 168 hours.  Glucose  Recent Labs Lab 04/28/16 1943 04/28/16 2332 04/29/16 0401 04/29/16 0717 04/29/16 1114 04/29/16 1557  GLUCAP 132* 119* 140* 123* 115* 96    Imaging No results found. STUDIES:  None  CULTURES: Blood 01/22>>no growth Influenza PCR 01/22>>NEG MRSA  neg ANTIBIOTICS: None  SIGNIFICANT EVENTS: 01/22>>Pt admitted to ICU due PEA arrest secondary to acute on chronic hypercapnic respiratory failure secondary to AECOPD requiring mechanical intubation with initiation of hypothermic protocol 1/23-remains on hypothermia protocol 33 degrees 1/24 rewarming phase 1/25 signs of anoxic brain injtury 1/26 signs of anoxic brain injury, withdraws to pain 1/27 MRI brain extensive anoxic brain injury   LINES/TUBES: ETT 01/22>> Right femoral CVL 01/22>>  ASSESSMENT / PLAN:  PULMONARY A: Acute on chronic hypercapnic respiratory failure secondary to AECOPD PEA arrest secondary to severe acidosis  Hx: Severe persistent asthma and Chronic home O2 at 4L P:   Full vent support Maintain O2 sats 90% to 94% Aggressive bronchodilator therapy IV steroids -completed hypothermic protocol for goal temp of 33 degree's Celsius for 24hrs  CARDIOVASCULAR A:  PEA arrest secondary to respiratory arrest with severe acidosis  Hx: Diastolic CHF and HTN P:  Maintain map greater than 65  Cardiology consulted appreciate input   RENAL A:   Lactic acidosis likely secondary to Cardiac Arrest  Acute renal failure likely secondary to hypotension Foley catheter Follow UOP   GASTROINTESTINAL A:   No acute issues Hx: GERD  P:   Pepcid for PUD prophylaxis Tube feeds held+ileus  HEMATOLOGIC A:   No acute issues  P: Trend CBC's Subq heparin for VTE prophylaxis Monitor for s/sx of bleeding   INFECTIOUS A:   Mild leukocytosis  P:   Trend WBC's and monitor fever curve Follow cultures   ENDOCRINE A:   Hyperglycemia  P:   CBG's q1hr Will initiate insulin gtt if remains hyperglycemic  NEUROLOGIC A:   Acute encephalopathy post PEA arrest Hx: Anxiety and Polysubstance abuse P:   RASS goal: -2 Assess RASS every 4 hours  -Versed, and Fentanyl gtts to maintain RASS goal -1 CT head no acute changes  Neurology consulted-MRI shows extensive brain  damage   Overall no changes in status. To promote vent synchrony and help with hypertension, fentanyl has been resumed. Awaiting arrival of family to proceed with vent withdrawal    Billy Fischeravid Caldwell Kronenberger, MD PCCM service Mobile [phone] Pager [phone]  04/29/2016

## 2016-04-29 NOTE — Progress Notes (Signed)
Nutrition Follow-up  DOCUMENTATION CODES:   Obesity unspecified  INTERVENTION:  -Recommend trial of trophic feedings with aggressive bowel regimen if aggressive intervention warranted. Continue to assess, await further decision regarding poc   NUTRITION DIAGNOSIS:   Inadequate oral intake related to acute illness as evidenced by NPO status.  GOAL:   Provide needs based on ASPEN/SCCM guidelines  MONITOR:   TF tolerance, Labs, Weight trends, I & O's  REASON FOR ASSESSMENT:   Ventilator    ASSESSMENT:   61 yo female admitted with acute on chronic respiratory failure secondary to AECOPD, PEA arrest secondary to severe acidosis. Pt with hx of severe COPD, HTN, GERD, depression, severe asthma, CHF, polysubstance abuse.  Pt remains on vent support TF remains on hold since 1/26 No BM since admission; abdomen soft, obese, BS hypoactive. Pt with dark brown output from OG tube but no documented amount. Last amount documented was 120 mL on 1/28  Diet Order:   NPO  Skin:  Reviewed, no issues  Last BM:  no documented BM  Height:   Ht Readings from Last 1 Encounters:  04/22/16 5\' 3"  (1.6 m)    Weight:   Wt Readings from Last 1 Encounters:  04/29/16 186 lb 11.7 oz (84.7 kg)    Ideal Body Weight:  52.3 kg  BMI:  Body mass index is 33.08 kg/m.  Estimated Nutritional Needs:   Kcal:  1000-1200 kcals  Protein:  >/= 105 g  Fluid:  >/= 2L   EDUCATION NEEDS:   No education needs identified at this time  Romelle StarcherCate Gaye Scorza MS, RD, LDN [phone] Pager  [phone] Weekend/On-Call Pager

## 2016-04-29 NOTE — Care Management (Signed)
Extensive anoxic brain injury. considering withdrawal of care.  Suggested palliative care consult

## 2016-04-29 NOTE — Progress Notes (Signed)
Pt is in stable condition at this time. No signs of pain using the CPOT scale. Full assessment in EPIC. Report given to Nia, Charity fundraiserN. No visit or phone call from daughters today.

## 2016-04-29 NOTE — Progress Notes (Signed)
Education could not be completed. Pt is sedated on vent. No Family to provide education to. Pt remained free from falls on my shift.

## 2016-04-30 LAB — GLUCOSE, CAPILLARY
Glucose-Capillary: 98 mg/dL (ref 65–99)
Glucose-Capillary: 119 mg/dL — ABNORMAL HIGH (ref 65–99)

## 2016-04-30 MED ORDER — GLYCOPYRROLATE 0.2 MG/ML IJ SOLN
0.2000 mg | INTRAMUSCULAR | Status: DC | PRN
  Administered 2016-04-30 (×2): 0.2 mg via INTRAVENOUS
  Filled 2016-04-30 (×2): qty 1

## 2016-04-30 MED ORDER — FENTANYL CITRATE (PF) 100 MCG/2ML IJ SOLN
25.0000 ug | INTRAMUSCULAR | Status: DC | PRN
  Administered 2016-04-30 (×2): 150 ug via INTRAVENOUS
  Filled 2016-04-30: qty 4

## 2016-04-30 MED ORDER — LORAZEPAM 2 MG/ML IJ SOLN
1.0000 mg | INTRAMUSCULAR | Status: AC
Start: 2016-04-30 — End: 2016-04-30
  Administered 2016-04-30: 1 mg via INTRAVENOUS

## 2016-04-30 MED ORDER — ATROPINE SULFATE 1 % OP SOLN
4.0000 [drp] | OPHTHALMIC | Status: DC | PRN
  Administered 2016-04-30: 4 [drp] via SUBLINGUAL
  Filled 2016-04-30 (×2): qty 2

## 2016-04-30 MED ORDER — FENTANYL CITRATE (PF) 100 MCG/2ML IJ SOLN: 25.0000 ug | INTRAMUSCULAR | Status: DC | PRN

## 2016-04-30 MED ORDER — LORAZEPAM 2 MG/ML IJ SOLN: 0.5000 mg | INTRAMUSCULAR | Status: DC | PRN

## 2016-04-30 MED ORDER — LORAZEPAM 2 MG/ML IJ SOLN
0.5000 mg | INTRAMUSCULAR | Status: DC | PRN
  Administered 2016-04-30 (×2): 1 mg via INTRAVENOUS
  Filled 2016-04-30 (×3): qty 1

## 2016-05-01 NOTE — Progress Notes (Signed)
CDS notified. Patient suitable for tissue donation and eye research. Eye prep requested per CDS. Referral number [id number].

## 2016-05-01 NOTE — Progress Notes (Signed)
PULMONARY / CRITICAL CARE MEDICINE   Name: Julie Foley MRN: [id number] DOB: December 01, 1955    ADMISSION DATE:  04/08/2016 CONSULTATION DATE:  04/27/2016  REFERRING MD:  Dr. Marcelene Butte   CHIEF COMPLAINT:  Cardiac Arrest   HISTORY OF PRESENT ILLNESS:   This is a 61 yo female with a PMH of HTN, GERD, Depression, Severe COPD (Gold stage IV, FEV1 34% 06/2009), Severe persistent asthma, Chronic home O2 at 4L, Diastolic CHF, Anxiety with panic attacks, and Polysubstance abuse.  She presented to [facility] ER 01/22 post PEA arrest.  Per ER notes the pt notified EMS due to c/o severe shortness of breath. According to pts daughter she was breathing ok the morning of 01/22.  Upon EMS arrival the pt was found to be in respiratory distress then went apneic became pulseless and then asystole ACLS protocol initiated with ROSC 10 minutes following CPR.  She was intubated in the field and transported to the ER.  Upon arrival to the ER she was breathing over the ventilator but not following commands, however she became bradycardic then went into PEA arrest ACLS protocol initiated she was administered 2 mg iv magnesium, 1 amp of bicarb, multiple doses of epi, and 1L fluid bolus with ROSC after 5 mins of CPR.  She coded again in the ER became hypotensive, bradycardic then went into a PEA arrest 1 amp epi, 1 amp sodium bicarb and chest compressions initiated with ROSC 2 minutes after interventions.  PCCM contacted to admit pt to ICU due PEA arrest secondary to acute on chronic hypercapnic respiratory failure secondary to AECOPD requiring mechanical intubation with initiation of hypothermic protocol.  REVIEW OF SYSTEMS:   Unable to assess pt intubated   SUBJECTIVE:  Pt opens eyes spontaneously, however unable to follow commands.  VITAL SIGNS: BP 118/77   Pulse 73   Temp 98.6 F (37 C) (Oral)   Resp (!) 23   Ht 5' 3"  (1.6 m)   Wt 84.7 kg (186 lb 11.7 oz)   SpO2 98%   BMI 33.08 kg/m   HEMODYNAMICS:    VENTILATOR  SETTINGS: Vent Mode: PRVC FiO2 (%):  [30 %-40 %] 30 % Set Rate:  [16 bmp] 16 bmp Vt Set:  [500 mL] 500 mL PEEP:  [5 cmH20] 5 cmH20  INTAKE / OUTPUT: I/O last 3 completed shifts: In: 779.7 [I.V.:719.7; NG/GT:60] Out: 905 [Urine:905]  PHYSICAL EXAMINATION: General: acutely ill appearing AA female Neuro:  Sedated, opens eyes spontaneously however does not track, does not follow commands, pupils unequal right 1 mm sluggish round, left 2 mm sluggish round HEENT: supple, no JVD Cardiovascular: nsr,, s1s2, no M/R/G Lungs: rhonchi and expiratory wheezes throughout, even, non labored, mechanically intubated  Abdomen: +BS x4, soft, obese, non tender, non distended  Musculoskeletal: normal bulk and tone, 1+ bilateral upper extremity edema Skin: intact no rashes or lesions GCS<8T  LABS: Reviewed  STUDIES:  None  CULTURES: Blood 01/22>>no growth Influenza PCR 01/22>>NEG MRSA neg  ANTIBIOTICS: None  SIGNIFICANT EVENTS: 01/22-Pt admitted to ICU due PEA arrest secondary to acute on chronic hypercapnic respiratory failure secondary to AECOPD requiring mechanical intubation with initiation of hypothermic protocol 1/23-remains on hypothermia protocol 33 degrees 1/24-rewarming phase 1/25-signs of anoxic brain injtury 1/26-signs of anoxic brain injury, withdraws to pain 1/27-MRI brain extensive anoxic brain injury 01/31-family meeting held discussed poor prognosis, therefore family decided to transition pt to comfort care measures only today   LINES/TUBES: ETT 01/22>> Right femoral CVL 01/22>>  ASSESSMENT / PLAN: Acute on  chronic hypercapnic respiratory failure secondary to AECOPD PEA arrest secondary to severe acidosis  Lactic acidosis likely secondary to Cardiac Arrest-resolved Acute renal failure likely secondary to hypotension-resolved Mild leukocytosis  Hyperglycemia-resolved  Acute encephalopathy post PEA arrest P: Family meeting held today 05-21-16, after speaking with  family extensively concerning MRI findings and pts poor quality of life pts family have decided to transition pt to comfort care measures only today 05/21/16.  Although this has been a difficult decision for pts daughters they stated their mother would not want a tracheostomy or peg tube. Therefore, there will be no escalation of care due to poor quality of life.  The goal of treatment at this point is to ensure pts comfort.  Once all family arrives at bedside will transition pt to comfort measures only 05/21/2016.   Marda Stalker, [place] Pager [phone] (please enter 7 [phone]) PCCM Consult Pager [phone] (please enter 7 digits)   PCCM ATTENDING ATTESTATION: I have evaluated patient with the APP [place], reviewed database in its entirety and discussed care plan in detail. In addition, this patient was discussed on multidisciplinary rounds. We met with pt's 2 daughters and her ex husband. Plan to proceed with withdrawal of vent support and full comfort care   Merton Border, MD PCCM service Mobile [phone] Pager [phone] 2016-05-21

## 2016-05-01 NOTE — Progress Notes (Signed)
Chaplain received an order to visit with pt and family in IC-15. Family was trying to decided when to take pt off of life support. I provided a pastoral presence. Chaplain will follow up as needed.   2016-12-14 1330  Clinical Encounter Type  Visited With Patient;Patient and family together  Visit Type Initial;Spiritual support  Referral From Nurse  Consult/Referral To Chaplain  Spiritual Encounters  Spiritual Needs Emotional;Grief support

## 2016-05-01 NOTE — Significant Event (Signed)
Patient has passed at this time. Several family members at bedside including children and patient's significant other have been made aware. Pronounced by Earlene PlaterNia Lovell Roe, RN and Reynolds BowlBeth Buono, RN. Bin[place], NP made aware at this time. Chaplain has been paged. Family is calm and tearful at bedside.

## 2016-05-01 NOTE — Progress Notes (Signed)
Patient made comfort care - Patient terminally extubated to room air.  Patient on fentanyl drip and PRN ativan given per MD order.  Patient resting comfortably at this time- family at bedside.

## 2016-05-01 NOTE — Progress Notes (Signed)
Pt. Was suctioned prior to extubation for a mod. Amount of thick white secretions. Per an order by [name] NP she was extubated to comfort care.

## 2016-05-01 NOTE — Discharge Summary (Signed)
DEATH SUMMARY  Name: Julie Foley MRN:   [id number] DOB:   03/15/1956         ADMISSION DATE:  04/24/2016 CONSULTATION DATE:  04/04/2016  REFERRING MD:  Dr. Huel CoteQuigley   CHIEF COMPLAINT:  Cardiac Arrest    [name] was  a 61 yo female with medical history significant forHTN, GERD, Depression, Severe COPD (Gold stage IV, FEV1 34% 06/2009), Severe persistent asthma, Chronic home O2 at 4L, Diastolic CHF, Anxiety with panic attacks, and Polysubstance abuse.  She presented [facility] ER 01/22 post PEA arrest.   Pati Per ER records  the pt notified EMS on 1/22 with c/o severe shortness of breath. According to pts daughter she was breathing ok the morning of 01/22.  Upon EMS arrival the pt was found to be in respiratory distress then went apneic became pulseless and then asystole ACLS protocol initiated with ROSC 10 minutes following CPR.  She was intubated in the field and transported to the ER.  Upon arrival to the ER she was breathing over the ventilator but not following commands, however she became bradycardic then went into PEA arrest ACLS protocol initiated she was administered 2 mg iv magnesium, 1 amp of bicarb, multiple doses of epi, and 1L fluid bolus with ROSC after 5 mins of CPR.   She coded again in the ER became hypotensive, bradycardic then went into a PEA arrest 1 amp epi, 1 amp sodium bicarb and chest compressions initiated with ROSC 2 minutes after interventions. PCCM team decided to follow hypothermic protocol. On 1/24 rewarming phase was initiated.  Patient opens her eyes spontaneously but did not have any purposeful movements. On 1/27 MRI was concerning for extensive anoxic  brain injury.  Therefore family meeting was held and discussed about the poor prognosis of the patient  Therefore family members decided to make the patient comfortable . Patient was transitioned to comfort care.  Patient was pronounced dead on 1/31 at 19:46.  Chaplain and other family members were present at the  bedside.  Cause of Death  PEA Arrest  Severe Anoxic Brain Injury  Acute on chronic hypercapnic respiratory failure secondary to AECOPD  severe acidosis   Acute renal failure  Time of Death: 19:46 Date Of Death: 2016/10/06    Bincy Varughese,AG-ACNP Pulmonary & Critical Care

## 2016-05-01 DEATH — deceased

## 2016-05-05 ENCOUNTER — Telehealth: Payer: Self-pay

## 2016-05-05 NOTE — Telephone Encounter (Signed)
Death certificate placed in DK's folder to be signed.  

## 2016-05-05 NOTE — Telephone Encounter (Signed)
Death Certificate placed in lbpu box.  Call [facility] When ready for pick up

## 2016-05-05 NOTE — Telephone Encounter (Signed)
error 

## 2016-05-05 NOTE — Telephone Encounter (Signed)
Death certificate placed up front for pick up. [facility] is made aware.  Nothing further needed.

## 2016-05-08 ENCOUNTER — Telehealth: Payer: Self-pay | Admitting: Emergency Medicine

## 2017-10-18 IMAGING — DX DG CHEST 1V
1 series · 1 of 1 positions shown · non-contrast
Comparison: 03/17/2016

CLINICAL DATA: Hypercarbia

EXAM:
CHEST 1 VIEW

[chest ap]
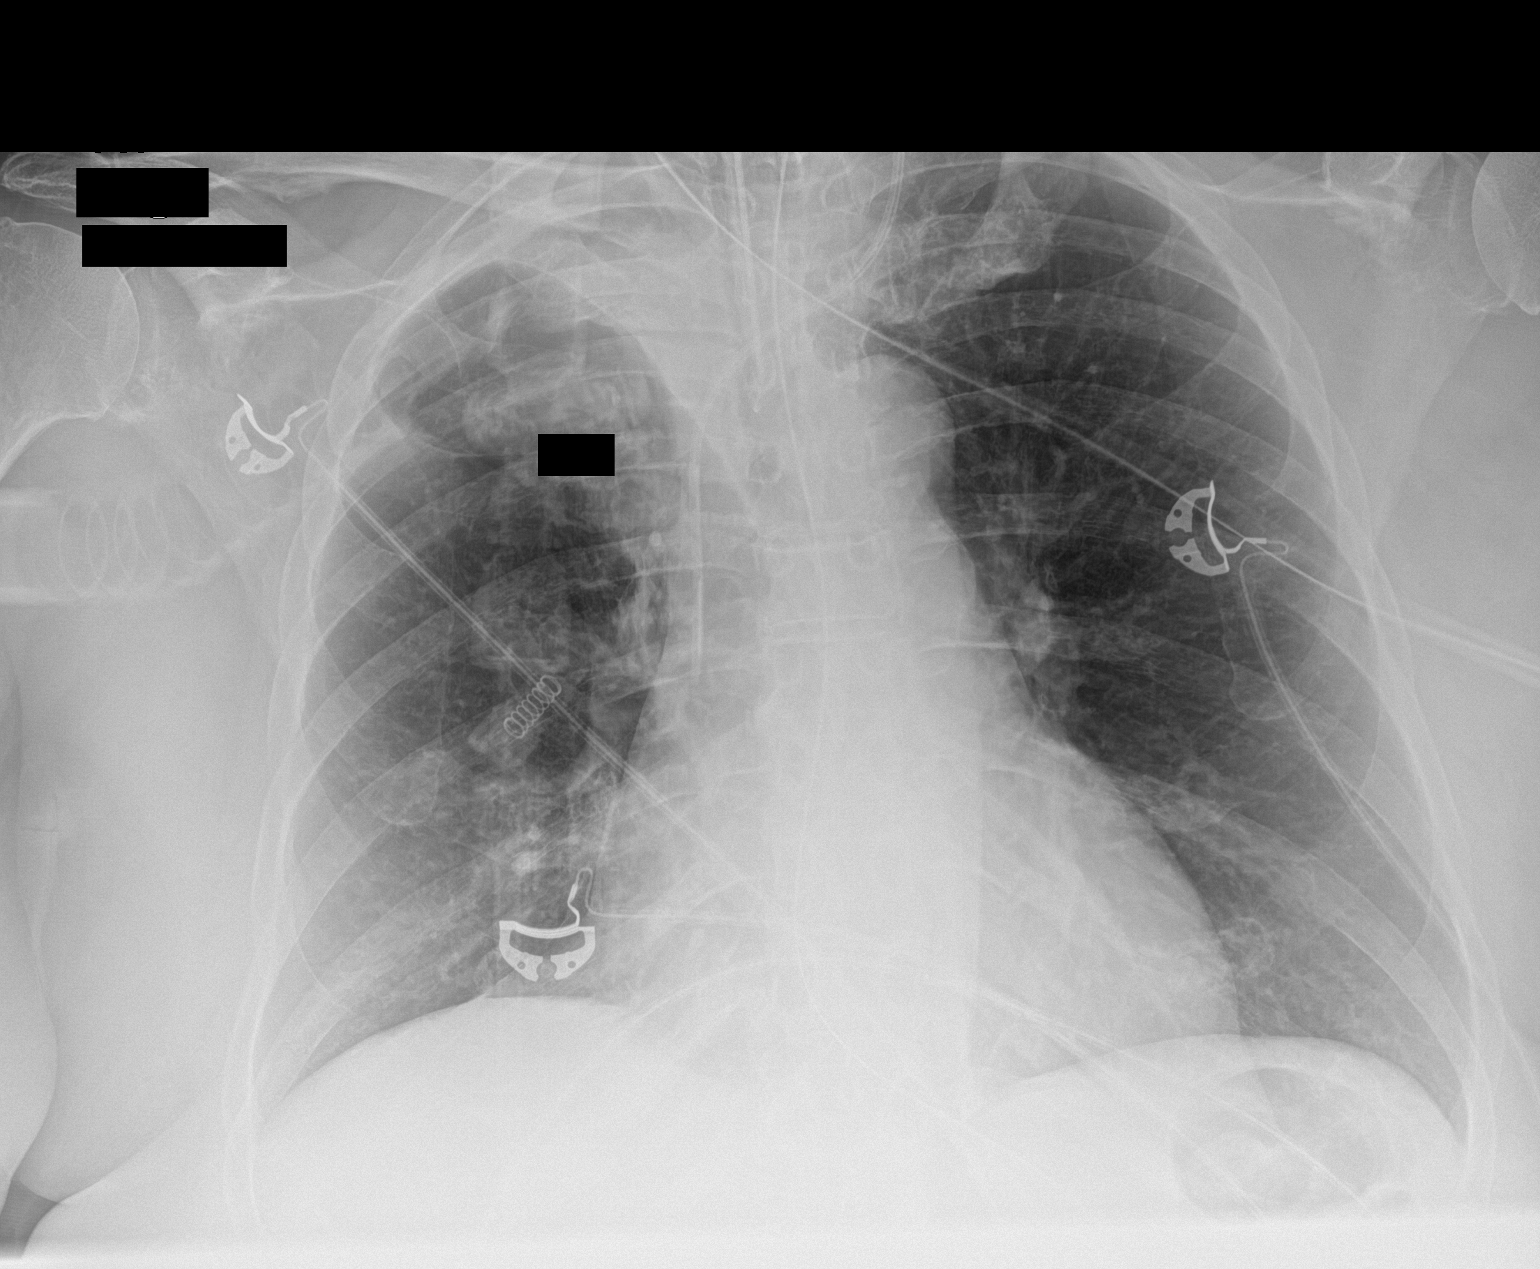

[1 of 1 positions shown; findings below may reference images not displayed]

FINDINGS: Endotracheal tube in good position. Left jugular central venous
catheter tip in the SVC unchanged. NG tube in the stomach.

Right upper lobe collapse is unchanged from the prior study. No
pneumothorax or pleural effusion. Negative for heart failure or
pneumonia.
IMPRESSION: Persistent right upper lobe collapse unchanged.  No new findings.

## 2017-10-23 IMAGING — DX DG CHEST 1V PORT
1 series · 1 of 1 positions shown · non-contrast
Comparison: 03/19/2016

CLINICAL DATA: Respiratory failure

EXAM:
PORTABLE CHEST 1 VIEW

[chest ap]
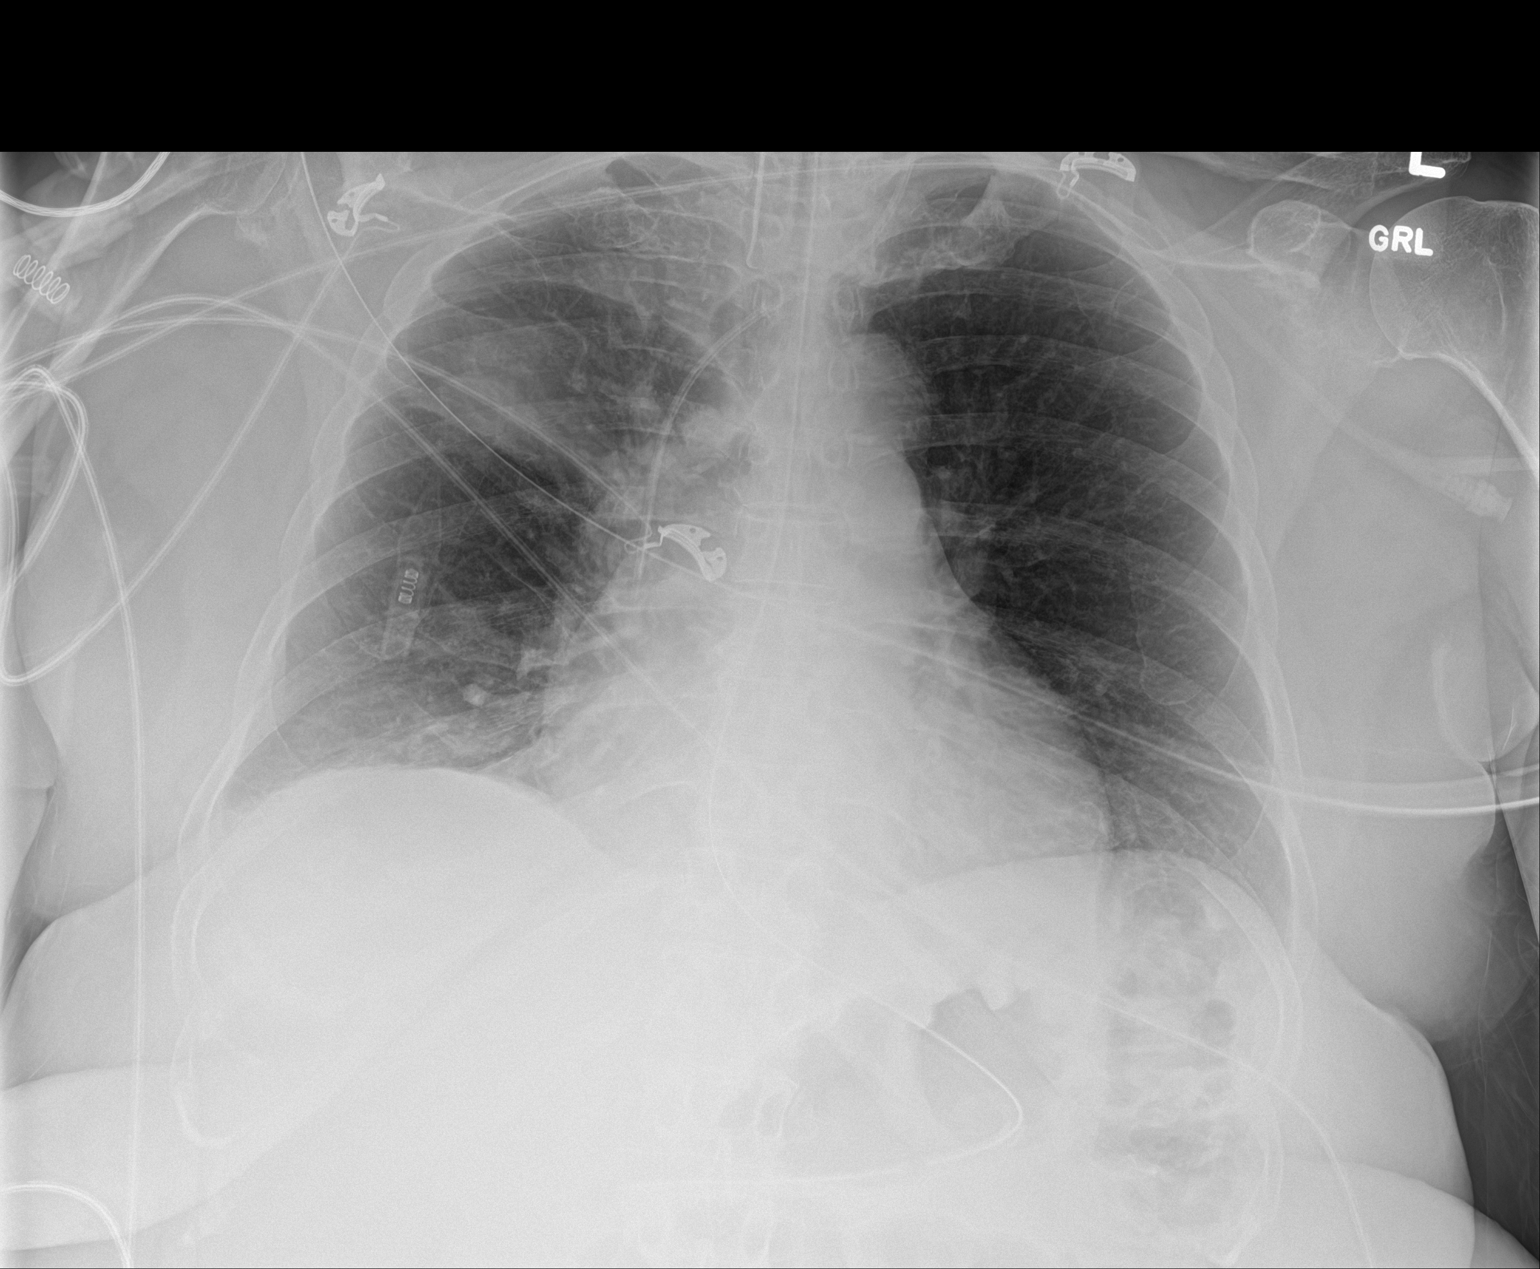

[1 of 1 positions shown; findings below may reference images not displayed]

FINDINGS: Endotracheal tube terminates 5.5 cm above the carina.

Vague focal opacity overlying the right upper lung, new. While
pneumonia is possible, this may also be related to the coursing
overlying chest lead.

Left lung is clear.  No pleural effusion or pneumothorax.

The heart is normal in size.

Left IJ venous catheter terminates at the cavoatrial junction.

Enteric tube terminates in the distal stomach.
IMPRESSION: Endotracheal tube terminates 5.5 cm above the carina.

Vague focal opacity overlying the right upper lung, new. While
pneumonia is possible, this may also be related to the coursing
overlying chest lead. Consider repeat with chest lead removed.
Otherwise, attention on follow-up is suggested.

Additional support apparatus as above.

## 2017-11-27 IMAGING — MR MR HEAD W/O CM
10 series · 48 of 48 positions shown · non-contrast
Comparison: Head CT from 2 days ago.

CLINICAL DATA: Anoxia

EXAM:
MRI HEAD WITHOUT CONTRAST
TECHNIQUE: Multiplanar, multiecho pulse sequences of the brain and surrounding
structures were obtained without intravenous contrast.

[Series 3: GRE · sagittal · 5.0mm · 0.45mm/px · 5 of 27 slices shown (1 of 2)]
[im 1/27]
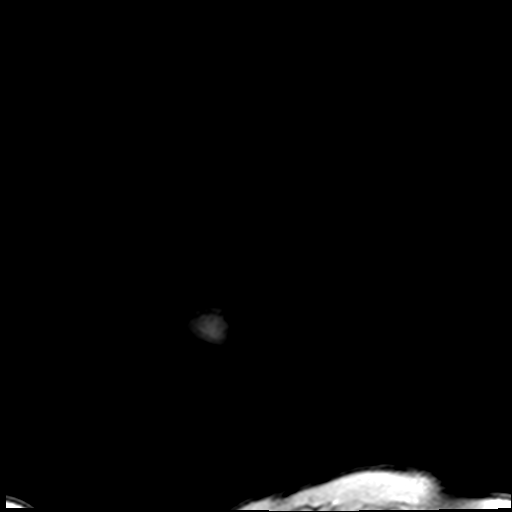
[im 7/27]
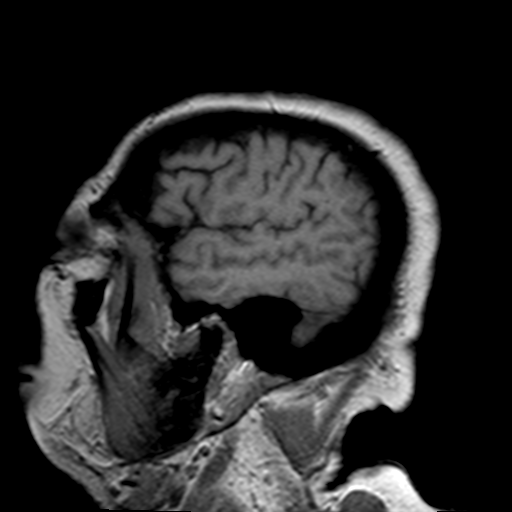
[im 14/27]
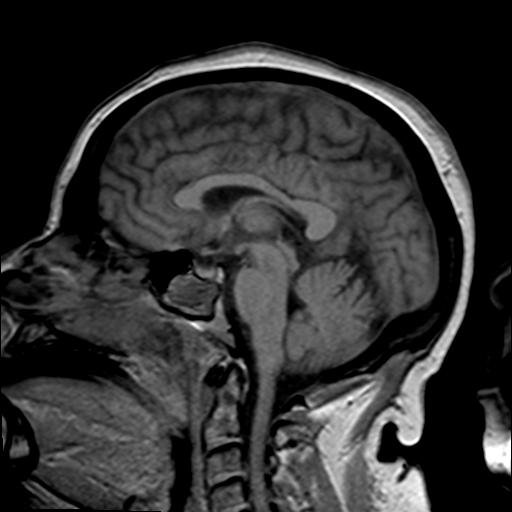
[im 20/27]
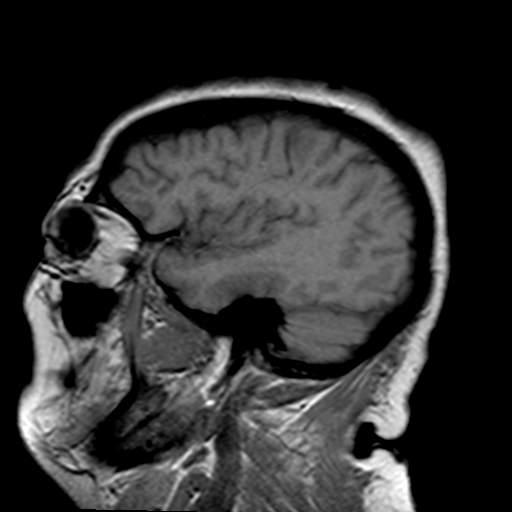
[im 27/27]
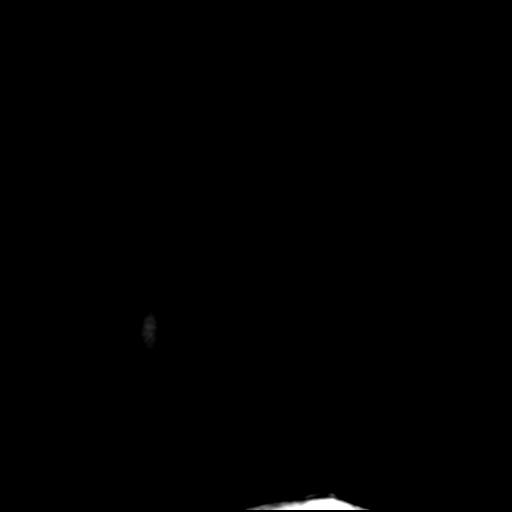

[Series 5: DWI · axial · 3.0mm · 1.80mm/px · z∈[-66,+92]mm · 9 of 55 slices shown (1 of 2)]
[im 1/55]
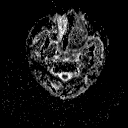
[im 7/55]
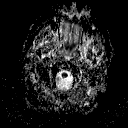
[im 14/55]
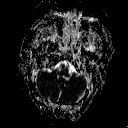
[im 21/55]
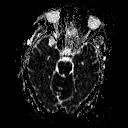
[im 28/55]
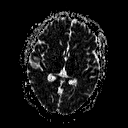
[im 34/55]
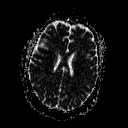
[im 41/55]
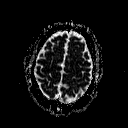
[im 48/55]
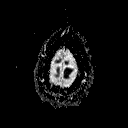
[im 55/55]
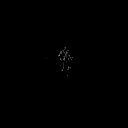

[Series 7: DWI · coronal · 3.0mm · 1.80mm/px · 6 of 46 slices shown (2 of 2)]
[im 1/46]
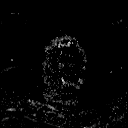
[im 10/46]
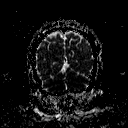
[im 19/46]
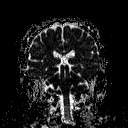
[im 28/46]
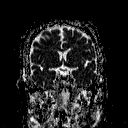
[im 37/46]
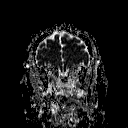
[im 46/46]
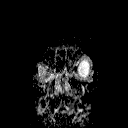

[Series 8: T2 · axial · 5.0mm · 0.45mm/px · z∈[-60,+73]mm · 3 of 22 slices shown (1 of 3)]
[im 1/22]
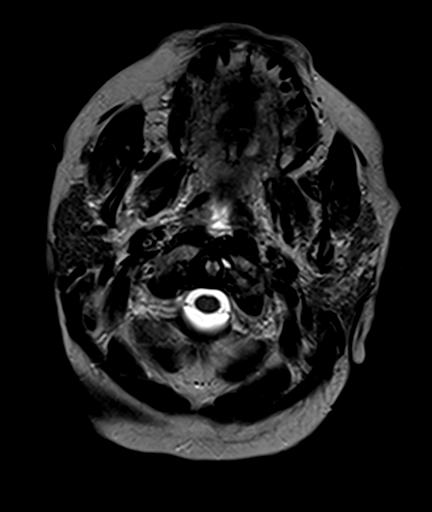
[im 11/22]
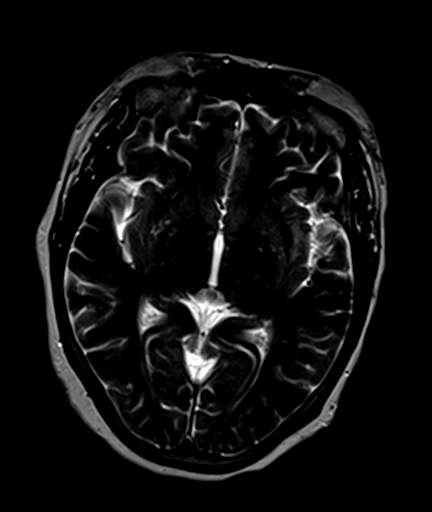
[im 22/22]
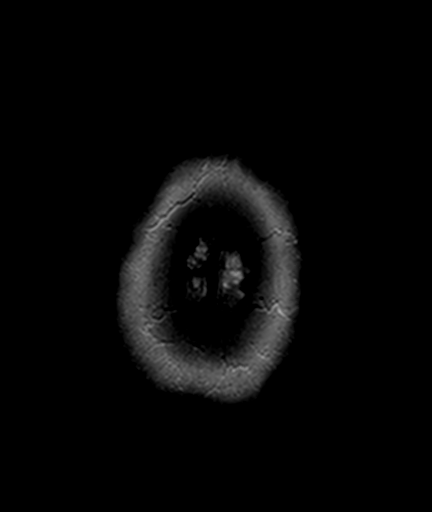

[Series 9: FLAIR · axial · 5.0mm · 0.45mm/px · z∈[-62,+92]mm · 3 of 25 slices shown]
[im 1/25]
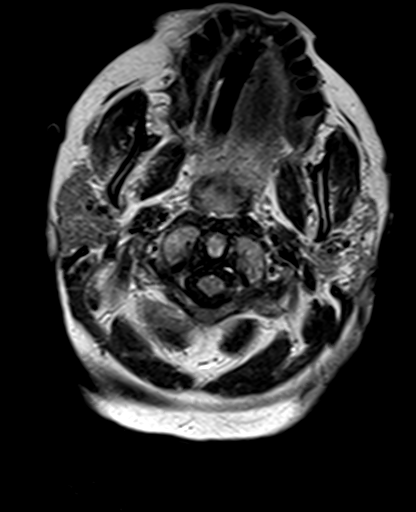
[im 13/25]
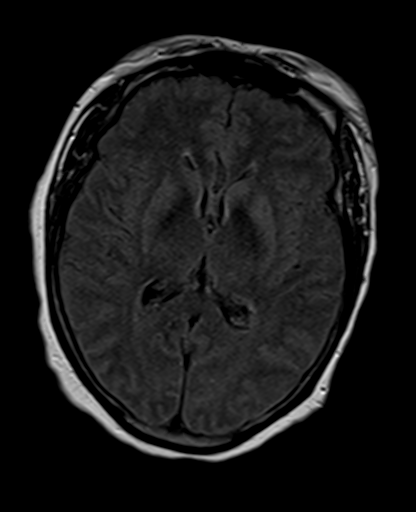
[im 25/25]
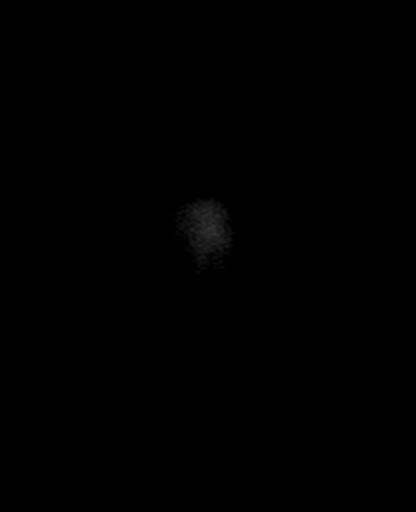

[Series 10: T2 · axial · 5.0mm · 1.20mm/px · z∈[-64,+89]mm · 3 of 25 slices shown (2 of 3)]
[im 1/25]
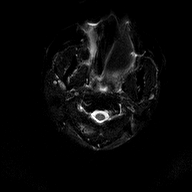
[im 13/25]
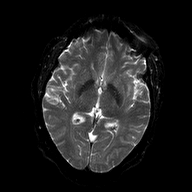
[im 25/25]
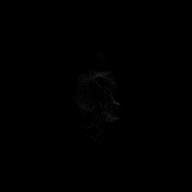

[Series 11: GRE · axial · 5.0mm · 0.45mm/px · z∈[-62,+91]mm · 3 of 25 slices shown (2 of 2)]
[im 1/25]
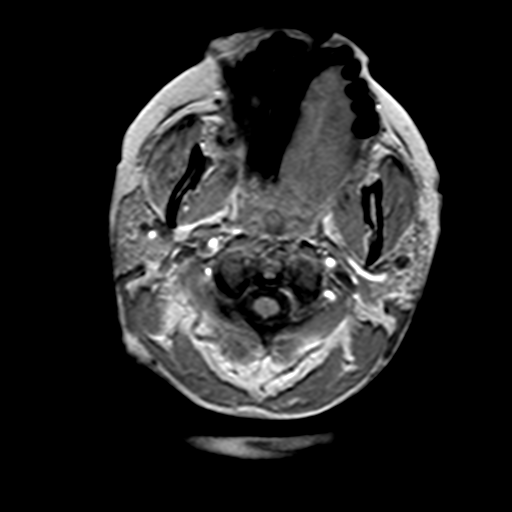
[im 13/25]
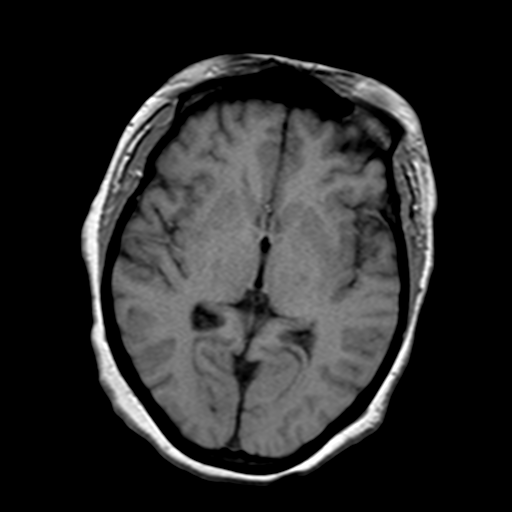
[im 25/25]
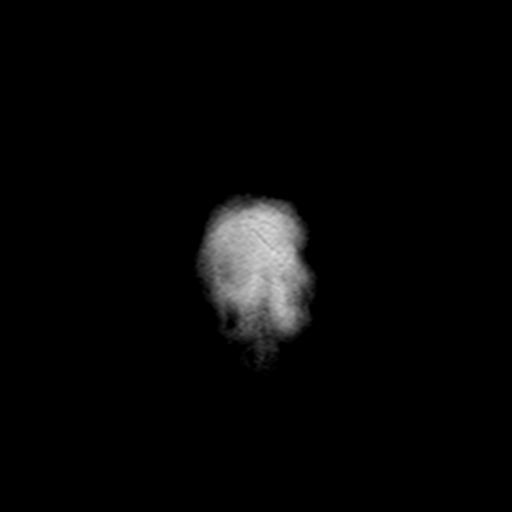

[Series 12: T2 · coronal · 5.0mm · 0.45mm/px · 3 of 21 slices shown (3 of 3)]
[im 1/21]
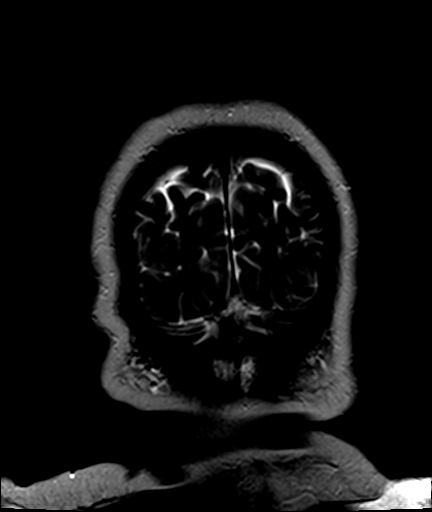
[im 11/21]
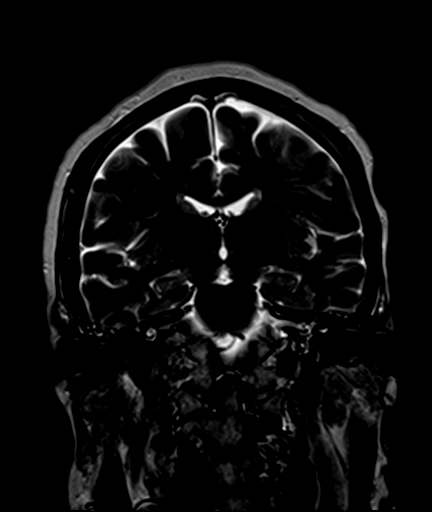
[im 21/21]
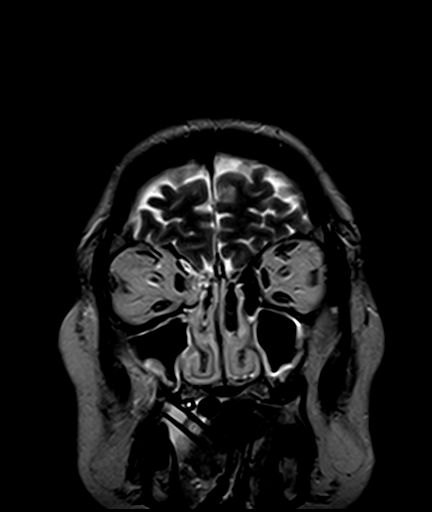

[Series 100: (id) · axial · 3.0mm · 1.80mm/px · z∈[-63,+92]mm · 7 of 54 slices shown (1 of 2)]
[im 1/54]
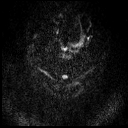
[im 9/54]
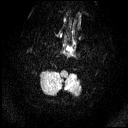
[im 18/54]
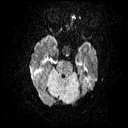
[im 27/54]
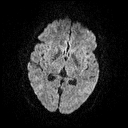
[im 36/54]
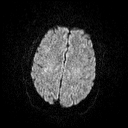
[im 45/54]
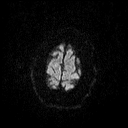
[im 54/54]
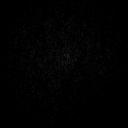

[Series 101: (id) · coronal · 3.0mm · 1.80mm/px · 6 of 46 slices shown (2 of 2)]
[im 1/46]
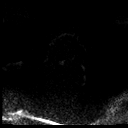
[im 10/46]
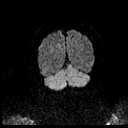
[im 19/46]
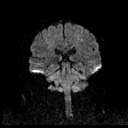
[im 28/46]
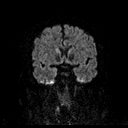
[im 37/46]
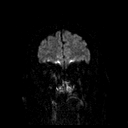
[im 46/46]
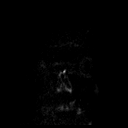

[48 of 48 positions shown; findings below may reference images not displayed]

FINDINGS: Brain: There is patchy low-grade restricted diffusion in the cortex
of the bilateral cerebral convexities, roughly following the
watershed territories. The caudate nuclei are symmetrically mildly
diffusion hyperintense. These areas show edematous change on FLAIR
imaging. No hemorrhage, hydrocephalus, or shift.

Vascular: Preserved flow voids.

Skull and upper cervical spine: No marrow lesion. Severe right TMJ
osteoarthritis of the with spurring.

Sinuses/Orbits: Remote blowout fracture of the medial wall right
orbit. Filling defect in the right sphenoid sinus is noncalcified by
CT, most consistent with proteinaceous mucous retention cyst. There
is maxillary fluid levels and nasopharyngeal opacification in the
setting of intubation. Bilateral mastoid effusion.
IMPRESSION: 1. Global anoxic injury pattern with patchy involvement of the
watershed cortex and caudate nuclei.
2. Sinus and mastoid fluid levels in the setting of intubation.
